# Patient Record
Sex: Female | Born: 1940
Health system: Southern US, Community
[De-identification: ages and names within clinical notes are randomized; demographics above are authoritative.]

## PROBLEM LIST (undated history)

## (undated) DIAGNOSIS — E049 Nontoxic goiter, unspecified: Secondary | ICD-10-CM

## (undated) DIAGNOSIS — E039 Hypothyroidism, unspecified: Secondary | ICD-10-CM

## (undated) DIAGNOSIS — C55 Malignant neoplasm of uterus, part unspecified: Secondary | ICD-10-CM

## (undated) DIAGNOSIS — I272 Pulmonary hypertension, unspecified: Secondary | ICD-10-CM

## (undated) DIAGNOSIS — I10 Essential (primary) hypertension: Secondary | ICD-10-CM

## (undated) DIAGNOSIS — K589 Irritable bowel syndrome without diarrhea: Secondary | ICD-10-CM

## (undated) DIAGNOSIS — M8580 Other specified disorders of bone density and structure, unspecified site: Secondary | ICD-10-CM

## (undated) DIAGNOSIS — H348122 Central retinal vein occlusion, left eye, stable: Secondary | ICD-10-CM

## (undated) DIAGNOSIS — H6980 Other specified disorders of Eustachian tube, unspecified ear: Secondary | ICD-10-CM

## (undated) DIAGNOSIS — J449 Chronic obstructive pulmonary disease, unspecified: Secondary | ICD-10-CM

## (undated) DIAGNOSIS — E119 Type 2 diabetes mellitus without complications: Secondary | ICD-10-CM

## (undated) DIAGNOSIS — R5383 Other fatigue: Secondary | ICD-10-CM

## (undated) DIAGNOSIS — G4700 Insomnia, unspecified: Secondary | ICD-10-CM

## (undated) DIAGNOSIS — IMO0002 Reserved for concepts with insufficient information to code with codable children: Secondary | ICD-10-CM

## (undated) DIAGNOSIS — E785 Hyperlipidemia, unspecified: Secondary | ICD-10-CM

## (undated) DIAGNOSIS — K5790 Diverticulosis of intestine, part unspecified, without perforation or abscess without bleeding: Secondary | ICD-10-CM

## (undated) DIAGNOSIS — K219 Gastro-esophageal reflux disease without esophagitis: Secondary | ICD-10-CM

## (undated) DIAGNOSIS — F419 Anxiety disorder, unspecified: Secondary | ICD-10-CM

## (undated) DIAGNOSIS — H6990 Unspecified Eustachian tube disorder, unspecified ear: Secondary | ICD-10-CM

## (undated) DIAGNOSIS — N189 Chronic kidney disease, unspecified: Secondary | ICD-10-CM

## (undated) DIAGNOSIS — C3490 Malignant neoplasm of unspecified part of unspecified bronchus or lung: Secondary | ICD-10-CM

## (undated) DIAGNOSIS — M329 Systemic lupus erythematosus, unspecified: Secondary | ICD-10-CM

## (undated) DIAGNOSIS — N3941 Urge incontinence: Secondary | ICD-10-CM

## (undated) DIAGNOSIS — M359 Systemic involvement of connective tissue, unspecified: Secondary | ICD-10-CM

## (undated) HISTORY — DX: Systemic involvement of connective tissue, unspecified: M35.9

## (undated) HISTORY — DX: Urge incontinence: N39.41

## (undated) HISTORY — DX: Unspecified eustachian tube disorder, unspecified ear: H69.90

## (undated) HISTORY — DX: Diverticulosis of intestine, part unspecified, without perforation or abscess without bleeding: K57.90

## (undated) HISTORY — DX: Gastro-esophageal reflux disease without esophagitis: K21.9

## (undated) HISTORY — DX: Other fatigue: R53.83

## (undated) HISTORY — DX: Other specified disorders of Eustachian tube, unspecified ear: H69.80

## (undated) HISTORY — DX: Other specified disorders of bone density and structure, unspecified site: M85.80

## (undated) HISTORY — DX: Central retinal vein occlusion, left eye, stable: H34.8122

## (undated) HISTORY — DX: Irritable bowel syndrome, unspecified: K58.9

## (undated) HISTORY — DX: Reserved for concepts with insufficient information to code with codable children: IMO0002

## (undated) HISTORY — DX: Malignant neoplasm of unspecified part of unspecified bronchus or lung: C34.90

## (undated) HISTORY — DX: Malignant neoplasm of uterus, part unspecified: C55

## (undated) HISTORY — DX: Essential (primary) hypertension: I10

## (undated) HISTORY — DX: Hyperlipidemia, unspecified: E78.5

## (undated) HISTORY — DX: Nontoxic goiter, unspecified: E04.9

## (undated) HISTORY — DX: Chronic obstructive pulmonary disease, unspecified: J44.9

## (undated) HISTORY — DX: Pulmonary hypertension, unspecified: I27.20

## (undated) HISTORY — DX: Hypothyroidism, unspecified: E03.9

## (undated) HISTORY — DX: Insomnia, unspecified: G47.00

## (undated) HISTORY — DX: Anxiety disorder, unspecified: F41.9

## (undated) HISTORY — DX: Systemic lupus erythematosus, unspecified: M32.9

## (undated) HISTORY — DX: Chronic kidney disease, unspecified: N18.9

## (undated) HISTORY — PX: THYROIDECTOMY: SHX17

---
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
## 1973-03-24 HISTORY — PX: TUBAL LIGATION: SHX77

## 1980-03-24 HISTORY — PX: CHOLECYSTECTOMY: SHX55

## 1999-04-25 HISTORY — PX: LOBECTOMY: SHX5089

## 1999-06-23 HISTORY — PX: ABDOMINAL HYSTERECTOMY: SHX81

## 2001-03-24 HISTORY — PX: OTHER SURGICAL HISTORY: SHX169

## 2002-08-03 ENCOUNTER — Encounter (INDEPENDENT_AMBULATORY_CARE_PROVIDER_SITE_OTHER): Payer: Self-pay | Admitting: Internal Medicine

## 2003-03-25 ENCOUNTER — Encounter (INDEPENDENT_AMBULATORY_CARE_PROVIDER_SITE_OTHER): Payer: Self-pay | Admitting: Internal Medicine

## 2003-03-25 LAB — CONVERTED CEMR LAB

## 2003-07-23 LAB — HM MAMMOGRAPHY

## 2004-11-05 ENCOUNTER — Ambulatory Visit: Payer: Self-pay | Admitting: Internal Medicine

## 2004-11-29 ENCOUNTER — Ambulatory Visit: Payer: Self-pay | Admitting: Internal Medicine

## 2005-01-01 ENCOUNTER — Ambulatory Visit: Payer: Self-pay | Admitting: Internal Medicine

## 2005-03-12 ENCOUNTER — Ambulatory Visit: Payer: Self-pay | Admitting: Family Medicine

## 2005-03-24 HISTORY — PX: OTHER SURGICAL HISTORY: SHX169

## 2005-04-08 ENCOUNTER — Ambulatory Visit: Payer: Self-pay | Admitting: Family Medicine

## 2005-04-21 ENCOUNTER — Ambulatory Visit: Payer: Self-pay | Admitting: Internal Medicine

## 2005-06-03 ENCOUNTER — Ambulatory Visit: Payer: Self-pay | Admitting: Family Medicine

## 2005-06-17 ENCOUNTER — Encounter: Payer: Self-pay | Admitting: Family Medicine

## 2005-06-22 ENCOUNTER — Encounter: Payer: Self-pay | Admitting: Family Medicine

## 2005-07-22 ENCOUNTER — Encounter: Payer: Self-pay | Admitting: Family Medicine

## 2005-08-21 ENCOUNTER — Ambulatory Visit: Payer: Self-pay | Admitting: Family Medicine

## 2005-09-08 ENCOUNTER — Ambulatory Visit: Payer: Self-pay | Admitting: Family Medicine

## 2005-10-12 ENCOUNTER — Encounter: Admission: RE | Admit: 2005-10-12 | Discharge: 2005-10-12 | Payer: Self-pay | Admitting: Orthopaedic Surgery

## 2005-12-10 ENCOUNTER — Ambulatory Visit: Payer: Self-pay | Admitting: Family Medicine

## 2005-12-18 ENCOUNTER — Ambulatory Visit: Payer: Self-pay | Admitting: Family Medicine

## 2005-12-31 ENCOUNTER — Ambulatory Visit: Payer: Self-pay | Admitting: Internal Medicine

## 2006-01-05 ENCOUNTER — Ambulatory Visit: Payer: Self-pay | Admitting: Family Medicine

## 2006-01-27 ENCOUNTER — Ambulatory Visit: Payer: Self-pay

## 2006-04-15 ENCOUNTER — Ambulatory Visit: Payer: Self-pay | Admitting: Family Medicine

## 2006-05-11 ENCOUNTER — Ambulatory Visit: Payer: Self-pay | Admitting: Family Medicine

## 2006-06-04 ENCOUNTER — Ambulatory Visit: Payer: Self-pay | Admitting: Family Medicine

## 2006-06-22 ENCOUNTER — Encounter (INDEPENDENT_AMBULATORY_CARE_PROVIDER_SITE_OTHER): Payer: Self-pay | Admitting: Internal Medicine

## 2006-06-22 DIAGNOSIS — I129 Hypertensive chronic kidney disease with stage 1 through stage 4 chronic kidney disease, or unspecified chronic kidney disease: Secondary | ICD-10-CM | POA: Insufficient documentation

## 2006-06-22 DIAGNOSIS — M8580 Other specified disorders of bone density and structure, unspecified site: Secondary | ICD-10-CM | POA: Insufficient documentation

## 2006-06-22 DIAGNOSIS — N183 Chronic kidney disease, stage 3 unspecified: Secondary | ICD-10-CM | POA: Insufficient documentation

## 2006-06-22 DIAGNOSIS — Z8719 Personal history of other diseases of the digestive system: Secondary | ICD-10-CM | POA: Insufficient documentation

## 2006-06-22 DIAGNOSIS — I10 Essential (primary) hypertension: Secondary | ICD-10-CM | POA: Insufficient documentation

## 2006-06-22 DIAGNOSIS — K219 Gastro-esophageal reflux disease without esophagitis: Secondary | ICD-10-CM | POA: Insufficient documentation

## 2006-06-22 DIAGNOSIS — E890 Postprocedural hypothyroidism: Secondary | ICD-10-CM | POA: Insufficient documentation

## 2006-06-22 DIAGNOSIS — E049 Nontoxic goiter, unspecified: Secondary | ICD-10-CM | POA: Insufficient documentation

## 2006-06-22 DIAGNOSIS — Z8542 Personal history of malignant neoplasm of other parts of uterus: Secondary | ICD-10-CM | POA: Insufficient documentation

## 2006-06-22 DIAGNOSIS — Z85118 Personal history of other malignant neoplasm of bronchus and lung: Secondary | ICD-10-CM | POA: Insufficient documentation

## 2006-08-14 ENCOUNTER — Ambulatory Visit: Payer: Self-pay | Admitting: Internal Medicine

## 2006-08-14 DIAGNOSIS — T7840XA Allergy, unspecified, initial encounter: Secondary | ICD-10-CM | POA: Insufficient documentation

## 2006-09-21 ENCOUNTER — Ambulatory Visit: Payer: Self-pay | Admitting: Internal Medicine

## 2006-10-02 ENCOUNTER — Ambulatory Visit: Payer: Self-pay | Admitting: Internal Medicine

## 2006-10-05 LAB — CONVERTED CEMR LAB
BUN: 10 mg/dL (ref 6–23)
CO2: 29 meq/L (ref 19–32)
GFR calc Af Amer: 108 mL/min
LDL Cholesterol: 126 mg/dL — ABNORMAL HIGH (ref 0–99)
Potassium: 3.8 meq/L (ref 3.5–5.1)
TSH: 0.59 microintl units/mL (ref 0.35–5.50)
Total CHOL/HDL Ratio: 4.7
VLDL: 16 mg/dL (ref 0–40)

## 2006-10-13 ENCOUNTER — Encounter (INDEPENDENT_AMBULATORY_CARE_PROVIDER_SITE_OTHER): Payer: Self-pay | Admitting: Internal Medicine

## 2006-10-24 ENCOUNTER — Emergency Department: Payer: Self-pay

## 2006-11-02 ENCOUNTER — Ambulatory Visit: Payer: Self-pay | Admitting: Family Medicine

## 2006-11-06 ENCOUNTER — Ambulatory Visit: Payer: Self-pay | Admitting: Family Medicine

## 2006-11-06 DIAGNOSIS — R300 Dysuria: Secondary | ICD-10-CM | POA: Insufficient documentation

## 2006-11-06 LAB — CONVERTED CEMR LAB
Ketones, urine, test strip: NEGATIVE
Specific Gravity, Urine: <1.005
Urobilinogen, UA: NEGATIVE
pH: 6

## 2006-12-18 ENCOUNTER — Telehealth: Payer: Self-pay | Admitting: Family Medicine

## 2006-12-29 ENCOUNTER — Ambulatory Visit: Payer: Self-pay | Admitting: Family Medicine

## 2007-01-29 ENCOUNTER — Telehealth (INDEPENDENT_AMBULATORY_CARE_PROVIDER_SITE_OTHER): Payer: Self-pay | Admitting: Internal Medicine

## 2007-03-13 ENCOUNTER — Telehealth: Payer: Self-pay | Admitting: Internal Medicine

## 2007-04-23 ENCOUNTER — Ambulatory Visit: Payer: Self-pay | Admitting: Family Medicine

## 2007-04-23 DIAGNOSIS — N3941 Urge incontinence: Secondary | ICD-10-CM | POA: Insufficient documentation

## 2007-04-26 ENCOUNTER — Encounter (INDEPENDENT_AMBULATORY_CARE_PROVIDER_SITE_OTHER): Payer: Self-pay | Admitting: Internal Medicine

## 2007-04-27 ENCOUNTER — Ambulatory Visit: Payer: Self-pay | Admitting: Family Medicine

## 2007-04-27 ENCOUNTER — Encounter (INDEPENDENT_AMBULATORY_CARE_PROVIDER_SITE_OTHER): Payer: Self-pay | Admitting: Internal Medicine

## 2007-04-27 DIAGNOSIS — E7800 Pure hypercholesterolemia, unspecified: Secondary | ICD-10-CM | POA: Insufficient documentation

## 2007-04-29 LAB — CONVERTED CEMR LAB
BUN: 11 mg/dL (ref 6–23)
Basophils Absolute: 0.1 10*3/uL (ref 0.0–0.1)
Basophils Relative: 2.7 % — ABNORMAL HIGH (ref 0.0–1.0)
Calcium: 9.7 mg/dL (ref 8.4–10.5)
Chloride: 103 meq/L (ref 96–112)
Cholesterol: 188 mg/dL (ref 0–200)
Creatinine, Ser: 0.8 mg/dL (ref 0.4–1.2)
Eosinophils Absolute: 0.2 10*3/uL (ref 0.0–0.6)
Eosinophils Relative: 5.5 % — ABNORMAL HIGH (ref 0.0–5.0)
GFR calc Af Amer: 92 mL/min
Glucose, Bld: 101 mg/dL — ABNORMAL HIGH (ref 70–99)
HDL: 43.2 mg/dL (ref >39.0)
LDL Cholesterol: 134 mg/dL — ABNORMAL HIGH (ref 0–99)
Monocytes Absolute: 0.4 10*3/uL (ref 0.2–0.7)
Sodium: 140 meq/L (ref 135–145)
TSH: 3.61 microintl units/mL (ref 0.35–5.50)
Total CHOL/HDL Ratio: 4.4
Triglycerides: 56 mg/dL (ref 0–149)
VLDL: 11 mg/dL (ref 0–40)
Vit D, 1,25-Dihydroxy: 100 — ABNORMAL HIGH (ref 30–89)

## 2007-05-04 ENCOUNTER — Encounter (INDEPENDENT_AMBULATORY_CARE_PROVIDER_SITE_OTHER): Payer: Self-pay | Admitting: Internal Medicine

## 2007-05-07 ENCOUNTER — Encounter (INDEPENDENT_AMBULATORY_CARE_PROVIDER_SITE_OTHER): Payer: Self-pay | Admitting: Internal Medicine

## 2007-05-07 ENCOUNTER — Ambulatory Visit: Payer: Self-pay | Admitting: Family Medicine

## 2007-05-10 ENCOUNTER — Telehealth (INDEPENDENT_AMBULATORY_CARE_PROVIDER_SITE_OTHER): Payer: Self-pay | Admitting: Internal Medicine

## 2007-06-08 ENCOUNTER — Encounter (INDEPENDENT_AMBULATORY_CARE_PROVIDER_SITE_OTHER): Payer: Self-pay | Admitting: Internal Medicine

## 2007-06-25 ENCOUNTER — Encounter (INDEPENDENT_AMBULATORY_CARE_PROVIDER_SITE_OTHER): Payer: Self-pay | Admitting: Internal Medicine

## 2007-06-28 ENCOUNTER — Ambulatory Visit: Payer: Self-pay | Admitting: Family Medicine

## 2007-06-28 LAB — CONVERTED CEMR LAB
Bilirubin Urine: NEGATIVE
Glucose, Urine, Semiquant: NEGATIVE
Protein, U semiquant: NEGATIVE
pH: 7

## 2007-06-29 ENCOUNTER — Telehealth (INDEPENDENT_AMBULATORY_CARE_PROVIDER_SITE_OTHER): Payer: Self-pay | Admitting: Internal Medicine

## 2007-07-12 ENCOUNTER — Encounter (INDEPENDENT_AMBULATORY_CARE_PROVIDER_SITE_OTHER): Payer: Self-pay | Admitting: Internal Medicine

## 2007-07-12 DIAGNOSIS — K589 Irritable bowel syndrome without diarrhea: Secondary | ICD-10-CM | POA: Insufficient documentation

## 2007-09-15 ENCOUNTER — Encounter: Admission: RE | Admit: 2007-09-15 | Discharge: 2007-09-15 | Payer: Self-pay | Admitting: Orthopaedic Surgery

## 2007-10-05 ENCOUNTER — Ambulatory Visit: Payer: Self-pay | Admitting: Family Medicine

## 2007-11-01 ENCOUNTER — Encounter (INDEPENDENT_AMBULATORY_CARE_PROVIDER_SITE_OTHER): Payer: Self-pay | Admitting: Internal Medicine

## 2007-11-01 ENCOUNTER — Ambulatory Visit (HOSPITAL_COMMUNITY): Admission: RE | Admit: 2007-11-01 | Discharge: 2007-11-01 | Payer: Self-pay | Admitting: Orthopaedic Surgery

## 2007-11-05 ENCOUNTER — Encounter (INDEPENDENT_AMBULATORY_CARE_PROVIDER_SITE_OTHER): Payer: Self-pay | Admitting: Internal Medicine

## 2007-11-19 ENCOUNTER — Encounter: Payer: Self-pay | Admitting: Internal Medicine

## 2007-11-30 ENCOUNTER — Encounter: Payer: Self-pay | Admitting: Internal Medicine

## 2007-12-14 ENCOUNTER — Ambulatory Visit: Payer: Self-pay | Admitting: Family Medicine

## 2007-12-17 ENCOUNTER — Encounter: Payer: Self-pay | Admitting: Family Medicine

## 2007-12-17 ENCOUNTER — Encounter: Payer: Self-pay | Admitting: Internal Medicine

## 2008-03-02 ENCOUNTER — Telehealth (INDEPENDENT_AMBULATORY_CARE_PROVIDER_SITE_OTHER): Payer: Self-pay | Admitting: Internal Medicine

## 2008-03-06 ENCOUNTER — Telehealth (INDEPENDENT_AMBULATORY_CARE_PROVIDER_SITE_OTHER): Payer: Self-pay | Admitting: Internal Medicine

## 2008-03-09 ENCOUNTER — Encounter (INDEPENDENT_AMBULATORY_CARE_PROVIDER_SITE_OTHER): Payer: Self-pay | Admitting: Internal Medicine

## 2008-03-24 HISTORY — PX: CT ABD W & PELVIS WO CM: HXRAD294

## 2008-03-29 ENCOUNTER — Telehealth (INDEPENDENT_AMBULATORY_CARE_PROVIDER_SITE_OTHER): Payer: Self-pay | Admitting: Internal Medicine

## 2008-03-31 ENCOUNTER — Telehealth (INDEPENDENT_AMBULATORY_CARE_PROVIDER_SITE_OTHER): Payer: Self-pay | Admitting: Internal Medicine

## 2008-03-31 ENCOUNTER — Ambulatory Visit: Payer: Self-pay | Admitting: Family Medicine

## 2008-04-07 LAB — CONVERTED CEMR LAB
CO2: 33 meq/L — ABNORMAL HIGH (ref 19–32)
Chloride: 105 meq/L (ref 96–112)
Cholesterol: 190 mg/dL (ref 0–200)
GFR calc Af Amer: 92 mL/min
GFR calc non Af Amer: 76 mL/min
Potassium: 4 meq/L (ref 3.5–5.1)
Total CHOL/HDL Ratio: 4.4
Triglycerides: 69 mg/dL (ref 0–149)
VLDL: 14 mg/dL (ref 0–40)

## 2008-05-19 ENCOUNTER — Ambulatory Visit: Payer: Self-pay | Admitting: Internal Medicine

## 2008-05-22 ENCOUNTER — Ambulatory Visit: Payer: Self-pay | Admitting: Oncology

## 2008-06-05 ENCOUNTER — Telehealth (INDEPENDENT_AMBULATORY_CARE_PROVIDER_SITE_OTHER): Payer: Self-pay | Admitting: Internal Medicine

## 2008-06-09 ENCOUNTER — Telehealth (INDEPENDENT_AMBULATORY_CARE_PROVIDER_SITE_OTHER): Payer: Self-pay | Admitting: Internal Medicine

## 2008-06-22 ENCOUNTER — Ambulatory Visit: Payer: Self-pay | Admitting: Oncology

## 2008-06-26 ENCOUNTER — Ambulatory Visit: Payer: Self-pay | Admitting: Oncology

## 2008-07-22 ENCOUNTER — Ambulatory Visit: Payer: Self-pay | Admitting: Oncology

## 2008-07-25 ENCOUNTER — Ambulatory Visit: Payer: Self-pay | Admitting: Family Medicine

## 2008-08-09 ENCOUNTER — Telehealth (INDEPENDENT_AMBULATORY_CARE_PROVIDER_SITE_OTHER): Payer: Self-pay | Admitting: Internal Medicine

## 2008-08-18 ENCOUNTER — Ambulatory Visit: Payer: Self-pay | Admitting: Family Medicine

## 2008-08-18 DIAGNOSIS — H6980 Other specified disorders of Eustachian tube, unspecified ear: Secondary | ICD-10-CM | POA: Insufficient documentation

## 2008-12-01 ENCOUNTER — Ambulatory Visit: Payer: Self-pay | Admitting: Family Medicine

## 2008-12-05 ENCOUNTER — Telehealth (INDEPENDENT_AMBULATORY_CARE_PROVIDER_SITE_OTHER): Payer: Self-pay | Admitting: Internal Medicine

## 2008-12-12 ENCOUNTER — Ambulatory Visit: Payer: Self-pay | Admitting: Family Medicine

## 2008-12-12 DIAGNOSIS — H5440 Blindness, one eye, unspecified eye: Secondary | ICD-10-CM | POA: Insufficient documentation

## 2008-12-27 ENCOUNTER — Emergency Department: Payer: Self-pay | Admitting: Emergency Medicine

## 2008-12-30 ENCOUNTER — Inpatient Hospital Stay: Payer: Self-pay | Admitting: Internal Medicine

## 2008-12-30 ENCOUNTER — Encounter: Payer: Self-pay | Admitting: Cardiology

## 2009-01-01 ENCOUNTER — Encounter: Payer: Self-pay | Admitting: Internal Medicine

## 2009-01-01 ENCOUNTER — Telehealth (INDEPENDENT_AMBULATORY_CARE_PROVIDER_SITE_OTHER): Payer: Self-pay | Admitting: Internal Medicine

## 2009-01-01 ENCOUNTER — Encounter (INDEPENDENT_AMBULATORY_CARE_PROVIDER_SITE_OTHER): Payer: Self-pay | Admitting: Internal Medicine

## 2009-01-02 ENCOUNTER — Encounter: Payer: Self-pay | Admitting: Internal Medicine

## 2009-01-02 ENCOUNTER — Ambulatory Visit: Payer: Self-pay | Admitting: Family Medicine

## 2009-01-03 ENCOUNTER — Encounter (INDEPENDENT_AMBULATORY_CARE_PROVIDER_SITE_OTHER): Payer: Self-pay | Admitting: Internal Medicine

## 2009-01-04 ENCOUNTER — Telehealth (INDEPENDENT_AMBULATORY_CARE_PROVIDER_SITE_OTHER): Payer: Self-pay | Admitting: Internal Medicine

## 2009-01-04 ENCOUNTER — Ambulatory Visit: Payer: Self-pay | Admitting: Cardiology

## 2009-01-04 ENCOUNTER — Telehealth: Payer: Self-pay | Admitting: Family Medicine

## 2009-01-04 DIAGNOSIS — J449 Chronic obstructive pulmonary disease, unspecified: Secondary | ICD-10-CM | POA: Insufficient documentation

## 2009-01-04 DIAGNOSIS — I2789 Other specified pulmonary heart diseases: Secondary | ICD-10-CM | POA: Insufficient documentation

## 2009-01-06 ENCOUNTER — Emergency Department (HOSPITAL_COMMUNITY): Admission: EM | Admit: 2009-01-06 | Discharge: 2009-01-06 | Payer: Self-pay | Admitting: Emergency Medicine

## 2009-01-06 ENCOUNTER — Telehealth (INDEPENDENT_AMBULATORY_CARE_PROVIDER_SITE_OTHER): Payer: Self-pay | Admitting: Internal Medicine

## 2009-01-08 ENCOUNTER — Ambulatory Visit: Admission: RE | Admit: 2009-01-08 | Discharge: 2009-01-08 | Payer: Self-pay | Admitting: Cardiology

## 2009-01-08 ENCOUNTER — Telehealth: Payer: Self-pay | Admitting: Cardiology

## 2009-01-08 ENCOUNTER — Encounter: Payer: Self-pay | Admitting: Cardiology

## 2009-01-08 ENCOUNTER — Encounter: Payer: Self-pay | Admitting: Internal Medicine

## 2009-01-10 ENCOUNTER — Ambulatory Visit: Payer: Self-pay

## 2009-01-10 ENCOUNTER — Telehealth: Payer: Self-pay | Admitting: Cardiology

## 2009-01-10 ENCOUNTER — Encounter: Payer: Self-pay | Admitting: Cardiology

## 2009-01-10 ENCOUNTER — Ambulatory Visit: Payer: Self-pay | Admitting: Cardiovascular Disease

## 2009-01-10 ENCOUNTER — Ambulatory Visit (HOSPITAL_COMMUNITY): Admission: RE | Admit: 2009-01-10 | Discharge: 2009-01-10 | Payer: Self-pay | Admitting: Cardiovascular Disease

## 2009-01-12 ENCOUNTER — Ambulatory Visit: Payer: Self-pay | Admitting: Cardiology

## 2009-01-12 DIAGNOSIS — E269 Hyperaldosteronism, unspecified: Secondary | ICD-10-CM | POA: Insufficient documentation

## 2009-01-12 DIAGNOSIS — E278 Other specified disorders of adrenal gland: Secondary | ICD-10-CM | POA: Insufficient documentation

## 2009-01-14 ENCOUNTER — Telehealth: Payer: Self-pay | Admitting: Nurse Practitioner

## 2009-01-15 LAB — CONVERTED CEMR LAB
BUN: 20 mg/dL (ref 6–23)
CO2: 27 meq/L (ref 19–32)
Calcium: 9.7 mg/dL (ref 8.4–10.5)
Creatinine, Ser: 0.88 mg/dL (ref 0.40–1.20)
Glucose, Bld: 73 mg/dL (ref 70–99)
Potassium: 4.4 meq/L (ref 3.5–5.3)
Sodium: 139 meq/L (ref 135–145)

## 2009-01-16 ENCOUNTER — Telehealth: Payer: Self-pay | Admitting: Cardiology

## 2009-01-17 ENCOUNTER — Telehealth: Payer: Self-pay | Admitting: Cardiology

## 2009-01-22 ENCOUNTER — Ambulatory Visit: Payer: Self-pay | Admitting: Oncology

## 2009-01-22 ENCOUNTER — Telehealth: Payer: Self-pay | Admitting: Cardiology

## 2009-01-25 ENCOUNTER — Encounter: Payer: Self-pay | Admitting: Cardiology

## 2009-01-31 ENCOUNTER — Ambulatory Visit: Payer: Self-pay | Admitting: Cardiology

## 2009-02-05 ENCOUNTER — Ambulatory Visit: Payer: Self-pay | Admitting: Oncology

## 2009-02-05 LAB — CONVERTED CEMR LAB
BUN: 17 mg/dL (ref 6–23)
Calcium: 9.6 mg/dL (ref 8.4–10.5)
Glucose, Bld: 86 mg/dL (ref 70–99)
Potassium: 4.5 meq/L (ref 3.5–5.3)

## 2009-02-12 ENCOUNTER — Telehealth: Payer: Self-pay | Admitting: Cardiology

## 2009-02-13 ENCOUNTER — Telehealth: Payer: Self-pay | Admitting: Cardiology

## 2009-02-13 ENCOUNTER — Telehealth (INDEPENDENT_AMBULATORY_CARE_PROVIDER_SITE_OTHER): Payer: Self-pay | Admitting: Internal Medicine

## 2009-02-14 ENCOUNTER — Ambulatory Visit: Payer: Self-pay | Admitting: Family Medicine

## 2009-02-16 ENCOUNTER — Ambulatory Visit: Payer: Self-pay | Admitting: Cardiology

## 2009-02-21 ENCOUNTER — Ambulatory Visit: Payer: Self-pay | Admitting: Oncology

## 2009-02-21 ENCOUNTER — Telehealth: Payer: Self-pay | Admitting: Cardiology

## 2009-03-05 ENCOUNTER — Telehealth: Payer: Self-pay | Admitting: Cardiology

## 2009-03-07 ENCOUNTER — Ambulatory Visit: Payer: Self-pay | Admitting: Oncology

## 2009-03-08 ENCOUNTER — Telehealth: Payer: Self-pay | Admitting: Cardiology

## 2009-03-09 ENCOUNTER — Ambulatory Visit: Payer: Self-pay | Admitting: Oncology

## 2009-03-12 ENCOUNTER — Telehealth: Payer: Self-pay | Admitting: Cardiology

## 2009-03-13 ENCOUNTER — Ambulatory Visit: Payer: Self-pay | Admitting: Cardiovascular Disease

## 2009-03-16 ENCOUNTER — Telehealth: Payer: Self-pay | Admitting: Nurse Practitioner

## 2009-03-20 ENCOUNTER — Telehealth: Payer: Self-pay | Admitting: Cardiology

## 2009-03-20 ENCOUNTER — Telehealth (INDEPENDENT_AMBULATORY_CARE_PROVIDER_SITE_OTHER): Payer: Self-pay | Admitting: Physician Assistant

## 2009-03-24 ENCOUNTER — Ambulatory Visit: Payer: Self-pay | Admitting: Oncology

## 2009-04-02 ENCOUNTER — Telehealth: Payer: Self-pay | Admitting: Cardiology

## 2009-04-04 ENCOUNTER — Ambulatory Visit: Payer: Self-pay | Admitting: Cardiology

## 2009-04-06 ENCOUNTER — Telehealth: Payer: Self-pay | Admitting: Cardiology

## 2009-04-09 ENCOUNTER — Telehealth: Payer: Self-pay | Admitting: Cardiology

## 2009-04-09 LAB — CONVERTED CEMR LAB
BUN: 18 mg/dL (ref 6–23)
CO2: 26 meq/L (ref 19–32)
Chloride: 105 meq/L (ref 96–112)
Sodium: 138 meq/L (ref 135–145)

## 2009-05-16 ENCOUNTER — Telehealth (INDEPENDENT_AMBULATORY_CARE_PROVIDER_SITE_OTHER): Payer: Self-pay | Admitting: *Deleted

## 2009-05-16 ENCOUNTER — Telehealth: Payer: Self-pay | Admitting: Internal Medicine

## 2009-05-16 ENCOUNTER — Telehealth: Payer: Self-pay | Admitting: Cardiology

## 2009-06-01 DIAGNOSIS — R5383 Other fatigue: Secondary | ICD-10-CM | POA: Insufficient documentation

## 2009-06-01 DIAGNOSIS — R5381 Other malaise: Secondary | ICD-10-CM | POA: Insufficient documentation

## 2009-06-04 ENCOUNTER — Ambulatory Visit: Payer: Self-pay | Admitting: Family Medicine

## 2009-06-11 LAB — CONVERTED CEMR LAB
Albumin: 3.9 g/dL (ref 3.5–5.2)
Alkaline Phosphatase: 69 units/L (ref 39–117)
Bilirubin, Direct: 0.1 mg/dL (ref 0.0–0.3)
HDL: 43 mg/dL (ref >39.00)
LDL Cholesterol: 111 mg/dL — ABNORMAL HIGH (ref 0–99)
T3, Free: 2.7 pg/mL (ref 2.3–4.2)
Total Bilirubin: 1 mg/dL (ref 0.3–1.2)
Total CHOL/HDL Ratio: 4
Total Protein: 8 g/dL (ref 6.0–8.3)

## 2009-06-21 ENCOUNTER — Ambulatory Visit: Payer: Self-pay | Admitting: Family Medicine

## 2009-06-25 ENCOUNTER — Telehealth: Payer: Self-pay | Admitting: Family Medicine

## 2009-07-10 ENCOUNTER — Ambulatory Visit: Payer: Self-pay | Admitting: Internal Medicine

## 2009-07-12 ENCOUNTER — Encounter: Payer: Self-pay | Admitting: Internal Medicine

## 2009-07-12 ENCOUNTER — Telehealth: Payer: Self-pay | Admitting: Internal Medicine

## 2009-07-16 ENCOUNTER — Telehealth (INDEPENDENT_AMBULATORY_CARE_PROVIDER_SITE_OTHER): Payer: Self-pay | Admitting: *Deleted

## 2009-07-18 ENCOUNTER — Telehealth: Payer: Self-pay | Admitting: Internal Medicine

## 2009-07-20 ENCOUNTER — Encounter: Payer: Self-pay | Admitting: Internal Medicine

## 2009-07-25 ENCOUNTER — Encounter: Payer: Self-pay | Admitting: Internal Medicine

## 2009-07-31 ENCOUNTER — Ambulatory Visit: Payer: Self-pay | Admitting: Cardiology

## 2009-08-01 ENCOUNTER — Telehealth: Payer: Self-pay | Admitting: Cardiology

## 2009-08-06 ENCOUNTER — Telehealth: Payer: Self-pay | Admitting: Cardiology

## 2009-08-07 LAB — CONVERTED CEMR LAB
BUN: 25 mg/dL — ABNORMAL HIGH (ref 6–23)
CO2: 29 meq/L (ref 19–32)
Chloride: 104 meq/L (ref 96–112)
Potassium: 5 meq/L (ref 3.5–5.1)

## 2009-08-16 ENCOUNTER — Telehealth (INDEPENDENT_AMBULATORY_CARE_PROVIDER_SITE_OTHER): Payer: Self-pay | Admitting: *Deleted

## 2009-09-20 ENCOUNTER — Telehealth: Payer: Self-pay | Admitting: Internal Medicine

## 2009-09-20 ENCOUNTER — Encounter (INDEPENDENT_AMBULATORY_CARE_PROVIDER_SITE_OTHER): Payer: Self-pay | Admitting: *Deleted

## 2009-09-20 ENCOUNTER — Telehealth: Payer: Self-pay | Admitting: Cardiology

## 2009-10-04 ENCOUNTER — Telehealth (INDEPENDENT_AMBULATORY_CARE_PROVIDER_SITE_OTHER): Payer: Self-pay | Admitting: *Deleted

## 2009-12-06 ENCOUNTER — Encounter: Payer: Self-pay | Admitting: Cardiology

## 2010-01-14 ENCOUNTER — Ambulatory Visit: Payer: Self-pay | Admitting: Cardiology

## 2010-01-15 ENCOUNTER — Telehealth: Payer: Self-pay | Admitting: Cardiology

## 2010-01-15 LAB — CONVERTED CEMR LAB
CO2: 29 meq/L (ref 19–32)
Chloride: 103 meq/L (ref 96–112)
Glucose, Bld: 65 mg/dL — ABNORMAL LOW (ref 70–99)

## 2010-01-22 ENCOUNTER — Encounter: Payer: Self-pay | Admitting: Cardiology

## 2010-01-22 ENCOUNTER — Ambulatory Visit: Payer: Self-pay | Admitting: Oncology

## 2010-01-25 ENCOUNTER — Ambulatory Visit: Payer: Self-pay | Admitting: Oncology

## 2010-01-25 ENCOUNTER — Encounter: Payer: Self-pay | Admitting: Internal Medicine

## 2010-01-28 ENCOUNTER — Encounter: Payer: Self-pay | Admitting: Cardiology

## 2010-02-01 ENCOUNTER — Telehealth: Payer: Self-pay | Admitting: Cardiology

## 2010-02-07 ENCOUNTER — Telehealth: Payer: Self-pay | Admitting: Cardiology

## 2010-02-21 ENCOUNTER — Ambulatory Visit: Payer: Self-pay | Admitting: Oncology

## 2010-03-28 ENCOUNTER — Ambulatory Visit
Admission: RE | Admit: 2010-03-28 | Discharge: 2010-03-28 | Payer: Self-pay | Source: Home / Self Care | Attending: Family Medicine | Admitting: Family Medicine

## 2010-04-23 ENCOUNTER — Encounter: Payer: Self-pay | Admitting: Family Medicine

## 2010-04-23 NOTE — Letter (Signed)
Summary: Generic Letter  Press photographer, [place]  [address] [place]   [place], [place] [address]   Phone: [phone]  Fax: [phone]        January 28, 2010 MRN: [id number]    [address] [place], [place]  [address]    Dear Ms. Quintanilla,  I have been unable to reach you by telephone. Please call me to discuss scheduling a chest CT that we talked about previously.         Sincerely,  Desiree Lucy, RN, BSN  This letter has been electronically signed by your physician.

## 2010-04-23 NOTE — Progress Notes (Signed)
  Phone Note Call from Patient   Caller: Patient Initial call taken by: Doug Sou CMA,  October 04, 2009 10:45 AM  Follow-up for Phone Call        Pt called today very upset as she stated that we still have not taken care of her medication refill request.  She was told by scheduler that the rx had been sent.  Reviewing records the Rxs she requested had been sent and the one she had not recvd confirmation on was her lorazepam.  I explained to her that per our last conversation she had been informed that the synthroid and Lorazepam would go to Dr. Everardo Beals  office for follow up since she had not seen a PCP.  She got very upset and raised her voice stating that she did not agree to that and wanted this medication filled today by Dr. Kirk Ruths.  Pt has been informed of our refill policy and that she will need to establish with PCP for follow up of these medications.  I agreed to fill Rx this time under Dr. Oleh Genin name for 3 month supply.  Pt understood direction to follow up with PCP.   Follow-up by: Doug Sou CMA,  October 04, 2009 10:51 AM

## 2010-04-23 NOTE — Progress Notes (Signed)
----   Converted from flag ---- ---- 09/20/2009 2:38 PM, Doug Sou CMA wrote: pt requesting refill on Lorazepam and Synthroid.  Please see record.  All other Rx filled 09/20/2009 AT, CMA --------------------------- Phone Note From Other Clinic   Summary of Call: needs appt here  okay to refill lorazepam  #15 x 1 until she can get appt here for follow up had 1 year Rx of levothyroxine done in January Initial call taken by: Claris Gower MD,  September 20, 2009 3:32 PM  Follow-up for Phone Call        Pt.called and said she has a new PCP and she'll be contacting the pharmacy to send the rx refill request to her new PCP. Follow-up by: Virgia Land,  September 21, 2009 9:02 AM

## 2010-04-23 NOTE — Progress Notes (Signed)
Summary: REFILL MEDS- MAIL TO PATIENT  Phone Note Refill Request Call back at Home Phone [phone] Message from:  Patient on Aug 01, 2009 9:36 AM  Refills Requested: Medication #1:  LORAZEPAM 1 MG TABS 1/2  by mouth at bedtime  Method Requested: Mail to Patient Initial call taken by: Neil Crouch,  Aug 01, 2009 9:37 AM    Prescriptions: LORAZEPAM 1 MG TABS (LORAZEPAM) 1/2  by mouth at bedtime  #15 x 1   Entered by:   Doug Sou CMA   Authorized by:   Loralie Champagne, MD   Signed by:   Doug Sou CMA on 08/01/2009   Method used:   Print then Give to Patient   RxIDVS:[id number] LORAZEPAM 1 MG TABS (LORAZEPAM) 1/2  by mouth at bedtime  #15 x 1   Entered by:   Doug Sou CMA   Authorized by:   Loralie Champagne, MD   Signed by:   Doug Sou CMA on 08/01/2009   Method used:   Handwritten   RxIDOI:[id number]

## 2010-04-23 NOTE — Progress Notes (Signed)
Summary: BP issues  Phone Note Outgoing Call   Summary of Call: called pt to let her know results of blood work.  pt stated that she was planning on calling because her BP had started to trend upward and she has stopped benezapril and restarted lotrel as discussed at last ROV.   Initial call taken by: Gabriel Cirri, RN, BSN,  April 09, 2009 11:02 AM     Appended Document: BP issues that is fine.

## 2010-04-23 NOTE — Progress Notes (Signed)
Summary: calling re ct scan already done  Phone Note Call from Patient   Caller: Patient Reason for Call: Talk to Nurse Summary of Call: pt had ct scan done by dr chotski-wanted to let us know Initial call taken by: Lorenda Hatchet,  February 01, 2010 10:55 AM  Follow-up for Phone Call        I talked with pt--she had CT done at [facility]-22-10 --she was told it was OK--she requested a copy be sent to Dr Nance Pew received  copy of CT chest without contrast done 01-22-10  Impression: No evidence of recurrent malignancy. Small  pulmonary nodules are unchanged  from prior studies. I will forward report to medical records to scan into [place]

## 2010-04-23 NOTE — Assessment & Plan Note (Signed)
Summary: ROV/AMD   Visit Type:  Follow-up Referring Provider:  Dixie Dials FNP Primary Provider:  Rosalita Levan Bean FNP  CC:  swelling in ankles and feet.  History of Present Illness: 70 yo with history of COPD and lung cancer and difficult to control hypertension presents for followup.    She was noted to have evidence for hyperaldosteronism by labs.  Dedicated adrenal CT did not show an adrenal adenoma.  She continues to walk about 4 miles a day without chest pain or shortness of breath.  Her BP is actually under good control on the current regimen, mainly running SBP 120s-130s.  She is not getting the night-time spikes or headaches.  No chest pain.  Her only complaint now is swelling in her ankles.  She is on high dose Procardia XL and also amlodipine (in Lotrel).   Labs (1/11): K 4.6, creatinine 1.59  Current Medications (verified): 1)  Synthroid 100 Mcg  Tabs (Levothyroxine Sodium) .... Take 1 Tablet By Mouth Once A Day 2)  Prevacid 15 Mg  Cpdr (Lansoprazole) .... Take 1 Tablet By Mouth Once A Day At Time Not Taking Med 3)  Calcium 600   Tabs (Calcium Carbonate Tabs) .... Take 1 Tablet By Mouth Two Times A Day 4)  Bl Balanced Care   Tabs (Multiple Vitamins-Minerals) .... Once Daily 5)  Albertsons Vitamin C   Tabs (Ascorbic Acid Tabs) .... Once Daily 6)  Flexeril 10 Mg Tabs (Cyclobenzaprine Hcl) .... Take One By Mouth At Bedtime As Needed 7)  Aspirin 81 Mg Tbec (Aspirin) .... Take One Tablet By Mouth Daily 8)  Bystolic 10 Mg Tabs (Nebivolol Hcl) .Marland Kitchen.. 1 Tab By Mouth Two Times A Day 9)  Spironolactone 25 Mg Tabs (Spironolactone) .Marland Kitchen.. 1  Tablet By Mouth Twice Daily 10)  Lorazepam 1 Mg Tabs (Lorazepam) .... 1/2  By Mouth At Bedtime 11)  Clonidine Hcl 0.1 Mg Tabs (Clonidine Hcl) .... One Tablet At Bedtime 12)  Procardia Xl 60 Mg Xr24h-Tab (Nifedipine) .Marland Kitchen.. 1 Tab By Mouth Two Times A Day 13)  Lotrel 5-40 Mg Caps (Amlodipine Besy-Benazepril Hcl) .Marland Kitchen.. 1 By Mouth  Daily  Allergies (verified): 1)  ! Diovan 2)  ! Norvasc  Vital Signs:  Patient profile:   70 year old female Height:      64 inches Weight:      138.75 pounds BMI:     23.90 Pulse rate:   58 / minute Pulse rhythm:   regular BP sitting:   112 / 50  (left arm) Cuff size:   regular  Vitals Entered By: Philemon Kingdom (April 04, 2009 10:21 AM)  Physical Exam  General:  Well developed, well nourished, in no acute distress. Neck:  Neck supple, no JVD. No masses, thyromegaly or abnormal cervical nodes. Lungs:  Clear bilaterally to auscultation and percussion. Heart:  Non-displaced PMI, chest non-tender; regular rate and rhythm, S1, S2 without murmur. +S4.  Carotid upstroke normal, no bruit. Pedals normal pulses. 1+ ankle edema bilaterally.  Abdomen:  Bowel sounds positive; abdomen soft and non-tender without masses, organomegaly, or hernias noted. No hepatosplenomegaly. Extremities:  No clubbing or cyanosis. Neurologic:  Alert and oriented x 3. Psych:  Normal affect.   Impression & Recommendations:  Problem # 1:  HYPERTENSION, UNSPECIFIED (ICD-401.9) Much improved compared to prior.  Patient now has some lower extremity edema.  She is on a lot of calcium channel blocker, which may be the culprit.  The edema has been uncomfortable at times.  She should  continue to wear compression hose.  I will have her stop Lotrel and instead take benazepril 40 mg daily.  She will continue on the same dose of Procardia.  If BP trends up, she can go back to the prior regimen.   Problem # 2:  HYPERALDOSTERONISM UNSPECIFIED (ICD-255.10) Continue spironolactone.  Check BMET today.   Problem # 3:  PURE HYPERCHOLESTEROLEMIA (ICD-272.0) Lipids/LFTs in 3 months.   Other Orders: T-Basic Metabolic Panel ([id number])  Patient Instructions: 1)  Your physician recommends that you schedule a follow-up appointment in: 3 months 2)  Your physician recommends that you return for a FASTING lipid profile: 2  months (lipids/ liver) 3)  Your physician has recommended you make the following change in your medication: stop lotrel, start benazepril 40mg  daily.  If you BP should start to trend upward, return to previous regimine.   Prescriptions: BENAZEPRIL HCL 40 MG TABS (BENAZEPRIL HCL) 1 tab by mouth daily  #30 x 1   Entered by:   Gabriel Cirri, RN, BSN   Authorized by:   Loralie Champagne, MD   Signed by:   Gabriel Cirri, RN, BSN on 04/04/2009   Method used:   [facility]        [facility] [facility]       [address]       [place], [place]  [address]       Ph: [phone] or [phone]       Fax: [phone]   RxID:   [id number]   Appended Document: Orders Update    Clinical Lists Changes  Orders: Added new Test order of T-Lipid Profile [phone]) - Signed Added new Test order of T-Hepatic Function [phone]) - Signed

## 2010-04-23 NOTE — Progress Notes (Signed)
Summary: refill request for lansoprazole  Phone Note Refill Request Message from:  Fax from Pharmacy  Refills Requested: Medication #1:  PREVACID 15 MG  CPDR Take 1 tablet by mouth once a day at time not taking med [facility] is on your desk- Billie's pt.  Initial call taken by: Marty Heck CMA,  May 16, 2009 11:38 AM  Follow-up for Phone Call        okay to refill for a year will need a follow up in the next few months with a different provider Follow-up by: Claris Gower MD,  May 16, 2009 12:57 PM  Additional Follow-up for Phone Call Additional follow up Details #1::        Rx [facility]ed to [facility] Additional Follow-up by: DeShannon Smith CMA Deborra Medina),  May 16, 2009 2:15 PM    Prescriptions: PREVACID 15 MG  CPDR (LANSOPRAZOLE) Take 1 tablet by mouth once a day at time not taking med  #90 x 3   Entered by:   Edwin Dada CMA ([place])   Authorized by:   Claris Gower MD   Signed by:   Edwin Dada CMA ([place]) on 05/16/2009   Method used:   Faxed to ...       [facility] (mail-order)       [address] [place], [place]  [id number]       Ph: [phone]       Fax: [phone]   RxID:   [id number]

## 2010-04-23 NOTE — Assessment & Plan Note (Signed)
Summary: BLOODY DIARRHEA/ lb   Vital Signs:  Patient profile:   70 year old female Weight:      133 pounds Temp:     97.9 degrees F oral BP sitting:   120 / 62  (left arm) Cuff size:   regular  Vitals Entered By: Edwin Dada CMA ([place]) (July 10, 2009 11:00 AM) CC: blood in stool   History of Present Illness: Started with diarrhea and abd pain yesterday Progressing redness with each progressive stool Now gets mucousy blood even with passing gas has knife like pain across lower abdomen Still with freq stools but mostly just mucus and blood  Nausea noted 2 nights ago--?the start of this Vomited yesterday multiple times--none since last night Able to eat 2 spoonfuls of rice crispies and 2 ritz crackers Drinking well--including gatorade  similar spell 13-14 years ago had to have IV fluids then  No fever Pain was constant before the vomiting---now is just intermittent  No travel out of the country No apparent tainted food No acquaintances with similar symptoms  Allergies: 1)  ! Diovan 2)  ! Norvasc  Past History:  Past medical, surgical, family and social histories (including risk factors) reviewed for relevance to current acute and chronic problems.  Past Medical History: 1. 3/09--IBS with constipation--[place] 2. HYPERTENSION: Has been difficult to control with spikes at night.  Has had hospitalizations with hypertensive    urgencies. Negative workup for pheochromocytoma with urine and plasma metanephrines and    catecholeamines normal.  Renal artery dopplers showed no renal artery stenosis.  Serum aldosterone/renin   ratio elevated (aldosterone 23.7, renin <0.15).  CT abdomen (not dedicated to adrenals) in 10/10 read as   "normal adrenals."  Dedicated adrenal CT (11/10) showed no adrenal adenoma.  There was a nodule at the   lung base, followup recommended.  3. HYPOTHYROID 4. GERD 5. Arthritis 6. COPD: quit smoking in July 03, 1991.   7. Lung cancer: s/p left lobectomy in  07/03/1999. 8. Uterine cancer s/p hysterectomy in 07/03/99.  9. Left eye central retinal venous occlusion.  10.  Diverticulosis 11.  Pulmonary hypertension: echo at [facility] (10/10) with EF > 55%, grade II diastolic dysfunction, RSVP > 60        mmHg.  See below for collagen vascular workup.  ACE level was normal.  However, followup echo at      [facility] showed EF 55-60%, mild MR, mild LAE, PASP 25 + RA (no evidence for pulmonary HTN).  12.  Uncharacterized collagen vascular disease: Has seen rheumatology.  Positive rheumatoid factor and      positive serologies for Sjogrens.  C-ANCA and P-ANCA negative, ANA negative, anti-dsDNA negative,     anti-SCL70 negative, anti-centromere antibody negative.   Past Surgical History: Reviewed history from 01/04/2009 and no changes required. RIGHT LUNG LOBECTOMY 02/01 HYSTERECTOMY 04/01 LEFT LUNG OK S/P SURGERY 06/01 CHOLECYSTECTOMY 1982 TUBAL LIGATION 1975 APPENDECTOMY 1958 LUNG LEFT SIDE SURGERY BENIGN 2002/07/03 CT chest--01/27/2006  enlarged thyroid extending into thoracis inlet (goiter) CT chest 01/2003  neg except for substernal thyroid and linear scarring rith upper lobe and left lower lobe CT head --12/30/2008--neg CT abd and pelvis--12/30/2008--neg except divertic Kidney scan--slow washout of R kidney, otherwise neg 2D transthoracic [id number] 2 diastolic dysfunction and severe pulm HTN, EF>55%,   Family History: Reviewed history from 01/04/2009 and no changes required. Father: DECEASED LATE 2022-07-03; PNEUMONIA AND ALCOHOLIC Mother: DECEASED 84 HTN No premature CAD  Social History: Reviewed history from 01/31/2009 and no changes required. Married, 4  children, lives in [place]. Originally from [place]. Tobacco Use - Former, quit 1993.  Alcohol Use - no Regular Exercise - yes Drug Use - no  Review of Systems       no dysuria No urgency--but has had some increased frequency (but forcing fluids)  Had loose stools with augmentin but that  resolved after she stopped  Physical Exam  General:  alert and normal appearance.   Neck:  supple, no masses, and no cervical lymphadenopathy.   Lungs:  normal respiratory effort and normal breath sounds.   Heart:  normal rate, regular rhythm, no murmur, and no gallop.   Abdomen:  soft and normal bowel sounds.   Mild RLQ tenderness no rebound  Extremities:  no edema   Impression & Recommendations:  Problem # 1:  DIARRHEA (ICD-787.91) Assessment New  the pain makes this seem like a dysentery type illness--- apparent colitis not clear if this is new illness (more likely due to concommittant vomiting with start of illness) or antibiotic related --since she had diarrhea with augmentin and just stopped last week No dehydration appears generally well  P:   check stool for C. diff      continue fluids      if no C diff and colitis continues, would try empriic Rx with cipro    if C diff present, metronidazole  Orders: T- * Misc. Laboratory test [id number]) Specimen Handling (99000)  Complete Medication List: 1)  Synthroid 100 Mcg Tabs (Levothyroxine sodium) .... Take 1 tablet by mouth once a day 2)  Prevacid 15 Mg Cpdr (Lansoprazole) .... Take 1 tablet by mouth once a day at time not taking med 3)  Flexeril 10 Mg Tabs (Cyclobenzaprine hcl) .... Take one by mouth at bedtime as needed 4)  Bystolic 10 Mg Tabs (Nebivolol hcl) .Marland Kitchen.. 1 tab by mouth two times a day 5)  Spironolactone 25 Mg Tabs (Spironolactone) .Marland Kitchen.. 1  tablet by mouth twice daily 6)  Lorazepam 1 Mg Tabs (Lorazepam) .... 1/2  by mouth at bedtime 7)  Clonidine Hcl 0.1 Mg Tabs (Clonidine hcl) .... One tablet at bedtime 8)  Procardia Xl 60 Mg Xr24h-tab (Nifedipine) .Marland Kitchen.. 1 tab by mouth two times a day 9)  Lotrel 5-40 Mg Caps (Amlodipine besy-benazepril hcl) .Marland Kitchen.. 1 tab by mouth daily- lotrel only: dispense as written 10)  Calcium 600 Tabs (Calcium carbonate tabs) .... Take 1 tablet by mouth two times a day 11)  Aspirin 81 Mg Tbec  (Aspirin) .... Take one tablet by mouth daily 12)  Bl Balanced Care Tabs (Multiple vitamins-minerals) .... Once daily 13)  Albertsons Vitamin C Tabs (Ascorbic acid tabs) .... Once daily  Patient Instructions: 1)  Try activia yogurt or probiotic tablets 2)  call if pain and bleeding doesn't improve 3)  Please return the stool specimen as soon as possible  Current Allergies (reviewed today): ! DIOVAN ! NORVASC

## 2010-04-23 NOTE — Progress Notes (Signed)
Summary: regarding labs for c-diff  Phone Note Call from Patient   Caller: Patient Call For: Dr. Silvio Pate Summary of Call: Please review lab result so that pt can be advised. Initial call taken by: Marty Heck CMA,  July 12, 2009 12:51 PM  Follow-up for Phone Call        Please call the c. diff test was negative  If her diarrhea is not any better, send Rx for cipro 500mg  two times a day for 7 days Follow-up by: Claris Gower MD,  July 12, 2009 1:10 PM  Additional Follow-up for Phone Call Additional follow up Details #1::        pt states she is not eating so she's not having any bowel movements, she is drinking water and eating some activia and crackers. Pt states she is still having abd pain and nausea and would like to know what this is? DeShannon Smith CMA Deborra Medina)  July 12, 2009 2:23 PM   It sounds like an infectious colitis as we had discussed. If the diarrhea is gone, just continue the [place] and fluids and hold off on the antibiotic. This should slowly improve and you should be able to advance your diet over the next few days Additional Follow-up by: Claris Gower MD,  July 12, 2009 2:51 PM    Additional Follow-up for Phone Call Additional follow up Details #2::    spoke with patient and she understood, she wanted me to send the abx anyway with the 4day weekend and she will call on Monday if she's not better. Rx sent to pharmacy. [place] Deborra Medina)  July 12, 2009 3:29 PM   okay Follow-up by: Claris Gower MD,  July 12, 2009 3:39 PM  New/Updated Medications: CIPROFLOXACIN HCL 500 MG TABS (CIPROFLOXACIN HCL) take 1 by mouth two times a day for 7 days Prescriptions: CIPROFLOXACIN HCL 500 MG TABS (CIPROFLOXACIN HCL) take 1 by mouth two times a day for 7 days  #14 x 0   Entered by:   Edwin Dada CMA ([place])   Authorized by:   Claris Gower MD   Signed by:   Edwin Dada CMA ([place]) on 07/12/2009   Method used:   [facility]     [facility] [facility]       [address]       [place], [place]  [address]       Ph: [phone] or [phone]       Fax: [phone]   RxID:   [id number]

## 2010-04-23 NOTE — Progress Notes (Signed)
Summary: RX  Phone Note Refill Request Call back at Home Phone [phone] Message from:  Patient on May 16, 2009 10:01 AM  Refills Requested: Medication #1:  BYSTOLIC 10 MG TABS 1 tab by mouth two times a day THE BYSTOLIC SHOULD BE 2 TIMES A DAY-[facility]  Initial call taken by: Zenovia Jarred,  May 16, 2009 10:01 AM  Follow-up for Phone Call        already placed rx by  [facility] Follow-up by: Philemon Kingdom,  May 17, 2009 7:37 AM

## 2010-04-23 NOTE — Progress Notes (Signed)
Summary: still with abd pain  Phone Note Call from Patient Call back at Home Phone [phone]   Caller: Patient Summary of Call: Pt is still having bad nausea, abd pain, loose stools.  She has not taken the cipro yet, but thinks she might need to.  She is asking if there is anything she can take along with that to ease the possible side effects of the abx.  She says the abd pain and loose stools will sometimes ease up but the nausea never goes away.  Please advise.  [facility]. Initial call taken by: Marty Heck CMA,  July 18, 2009 3:15 PM  Follow-up for Phone Call        Have her try the antibiotic She can take activia yogurt or try probiotic pills available in pharmacy or health food store this will sometimes help prevent yeast infections, etc Follow-up by: Claris Gower MD,  July 18, 2009 7:21 PM  Additional Follow-up for Phone Call Additional follow up Details #1::        Advised pt. Additional Follow-up by: Marty Heck CMA,  July 19, 2009 8:35 AM

## 2010-04-23 NOTE — Miscellaneous (Signed)
  Clinical Lists Changes Pt now listing Dr. Silvio Pate as PCP.  Will not be able to see Dr. Glori Bickers as referred.  Dr. Glori Bickers is not accepting any new pts at this time.  Flag sent to Dr. Everardo Beals office requesting Lorazepam and Synthroid.

## 2010-04-23 NOTE — Progress Notes (Signed)
Summary: RX  Phone Note Refill Request Call back at Home Phone [phone] Message from:  Patient on April 02, 2009 8:18 AM  Refills Requested: Medication #1:  LOTREL 5-40 MG CAPS. [facility] FAX # [phone]  Initial call taken by: Zenovia Jarred,  April 02, 2009 8:18 AM    New/Updated Medications: LOTREL 5-40 MG CAPS (AMLODIPINE BESY-BENAZEPRIL HCL) 1 by mouth daily Prescriptions: LOTREL 5-40 MG CAPS (AMLODIPINE BESY-BENAZEPRIL HCL) 1 by mouth daily  #90 x 3   Entered by:   Philemon Kingdom   Authorized by:   Loralie Champagne, MD   Signed by:   Philemon Kingdom on 04/02/2009   Method used:   Faxed to ...       [facility] (mail-order)       [place], [place]  [id number]       Ph: [phone]       Fax: [phone]   RxID:   [id number] PROCARDIA XL 60 MG XR24H-TAB (NIFEDIPINE) 1 tab by mouth two times a day  #180 x 3   Entered by:   Philemon Kingdom   Authorized by:   Loralie Champagne, MD   Signed by:   Philemon Kingdom on 04/02/2009   Method used:   Print then Give to Patient   RxID:   [id number] PROCARDIA XL 60 MG XR24H-TAB (NIFEDIPINE) 1 tab by mouth two times a day  #60 x 1   Entered by:   Philemon Kingdom   Authorized by:   Loralie Champagne, MD   Signed by:   Philemon Kingdom on 04/02/2009   Method used:   [facility]        [facility] [facility]       [address]       [place], [place]  [address]       Ph: [phone] or [phone]       Fax: [phone]   RxID:   [id number]

## 2010-04-23 NOTE — Progress Notes (Signed)
Summary: BP  Phone Note Call from Patient Call back at Home Phone [phone]   Caller: SELF Call For: [facility] Summary of Call: BP AVERAGING IN MED BETWEEN 140-150/78 DURING THE DAY Initial call taken by: Zenovia Jarred,  January 08, 2009 12:40 PM     Appended Document: BP If BP stays consistently over 140 the rest of this week, would start spironolactone 12.5 mg daily with chem 7 in 2 wks.    Appended Document: BP started today @ pts request.  Will see DM next week. Gabriel Cirri RN BSN

## 2010-04-23 NOTE — Progress Notes (Signed)
Summary: rx refill  Phone Note Refill Request Message from:  Patient on February 07, 2010 4:06 PM  Refills Requested: Medication #1:  LORAZEPAM 1 MG TABS 1/4  by mouth at bedtime [facility]# [phone] fax# [phone]   Method Requested: Fax to Local Pharmacy Initial call taken by: Regan Lemming,  February 07, 2010 4:06 PM  Follow-up for Phone Call       Follow-up by: Doug Sou CMA,  February 11, 2010 8:40 AM    Called Rx  in to [facility].  Authorized a refill of 45 tablets with 1 refill.  This should cover 3 months with 1 additional refill at the current dose.  There have been several conversations with this pt regarding this medication and following up with PCP.  Dr. Aundra Dubin has been authorizing this refill though.  No additional follow up.  Doug Sou, CMA

## 2010-04-23 NOTE — Assessment & Plan Note (Signed)
Summary: from check-out 07-31-09   Visit Type:  Follow-up Referring Provider:  Dixie Dials FNP Primary Provider:  Loura Pardon, MD  CC:  no change.  History of Present Illness: 70 yo with history of COPD and lung cancer and difficult to control hypertension presents for followup.    She was noted to have evidence for hyperaldosteronism by labs.  Dedicated adrenal CT did not show an adrenal adenoma.  Blood pressure continues to be under control on the current regimen. Main complaint recently has been hair loss as well as some itching in her hairline in the back and mouth ulcers.  She is concerned that these are medication side effects. She also reports some drowsiness and fatigue.   Labs (1/11): K 4.6, creatinine 1.59 Labs (3/11): LDL 111, HDL 43 Labs (5/11): K 5, creatinine 1.2  Problems Prior to Update: 1)  Fatigue  (ICD-780.79) 2)  Hypertension, Unspecified  (ICD-401.9) 3)  Hyperaldosteronism Unspecified  (ICD-255.10) 4)  Hyperplasia, Adrenal  (ICD-255.8) 5)  COPD  (ICD-496) 6)  Hypertension, Pulmonary  (ICD-416.8) 7)  Renal Artery Stenosis  (ICD-440.1) 8)  Hypertension, Uncontrolled  (ICD-401.9) 9)  Unspecified Subjective Visual Disturbance  (ICD-368.10) 10)  Eustachian Tube Dysfunction  (ICD-381.81) 11)  Skin Rash  (ICD-782.1) 12)  Preventive Health Care  (ICD-V70.0) 13)  Ibs  (ICD-564.1) 14)  Uti  (ICD-599.0) 15)  Memory Loss  (ICD-780.93) 16)  Pure Hypercholesterolemia  (ICD-272.0) 17)  Memory Loss  (ICD-780.93) 18)  Incontinence, Urge  (ICD-788.31) 19)  Dysuria  (ICD-788.1) 20)  Examination, Routine Medical  (ICD-V70.0) 21)  Skin Rash  (ICD-782.1) 22)  Allergic Reaction  (ICD-995.3) 23)  Goiter  (ICD-240.9) 24)  Osteopenia  (ICD-733.90) 25)  Gerd  (ICD-530.81) 26)  Hypothyroidism  (ICD-244.9) 27)  Hypertension  (ICD-401.9) 28)  Diverticulitis, Hx of  (ICD-V12.79) 29)  Uterine Cancer, Hx of  (ICD-V10.42) 30)  Carcinoma, Lung, Hx of  (ICD-V10.11) 31)   Leiomyoma, Uterus With Lung Mets  (ICD-218.9)  Current Medications (verified): 1)  Synthroid 100 Mcg  Tabs (Levothyroxine Sodium) .... Take 1 Tablet By Mouth Once A Day 2)  Prevacid 15 Mg  Cpdr (Lansoprazole) .... Take 1 Tablet By Mouth Once A Day At Time Not Taking Med 3)  Bystolic 10 Mg Tabs (Nebivolol Hcl) .Marland Kitchen.. 1 Tab By Mouth Two Times A Day 4)  Spironolactone 25 Mg Tabs (Spironolactone) .Marland Kitchen.. 1  Tablet By Mouth Twice Daily 5)  Lorazepam 1 Mg Tabs (Lorazepam) .... 1/4  By Mouth At Bedtime 6)  Clonidine Hcl 0.1 Mg Tabs (Clonidine Hcl) .... One Tablet At Bedtime 7)  Procardia Xl 60 Mg Xr24h-Tab (Nifedipine) .Marland Kitchen.. 1 Tab By Mouth Two Times A Day 8)  Lotrel 5-40 Mg Caps (Amlodipine Besy-Benazepril Hcl) .Marland Kitchen.. 1 Tab By Mouth Daily- Lotrel Only: Dispense As Written 9)  Aspirin 81 Mg Tbec (Aspirin) .... Take One Tablet By Mouth Daily 10)  Bl Balanced Care   Tabs (Multiple Vitamins-Minerals) .... Once Daily 11)  [employer] Vitamin C   Tabs (Ascorbic Acid Tabs) .... Once Daily 12)  Catapres 0.1 Mg Tabs (Clonidine Hcl) .... One At Bedtime  Allergies (verified): 1)  ! Diovan 2)  ! Norvasc 3)  ! * No Antibiotic Without Flagyl  Past History:  Past Medical History: Reviewed history from 07/10/2009 and no changes required. 1. 3/09--IBS with constipation--[facility] 2. HYPERTENSION: Has been difficult to control with spikes at night.  Has had hospitalizations with hypertensive    urgencies. Negative workup for pheochromocytoma with urine and plasma  metanephrines and    catecholeamines normal.  Renal artery dopplers showed no renal artery stenosis.  Serum aldosterone/renin   ratio elevated (aldosterone 23.7, renin <0.15).  CT abdomen (not dedicated to adrenals) in 10/10 read as   "normal adrenals."  Dedicated adrenal CT (11/10) showed no adrenal adenoma.  There was a nodule at the   lung base, followup recommended.  3. HYPOTHYROID 4. GERD 5. Arthritis 6. COPD: quit smoking in Jun 25, 1991.   7. Lung cancer: s/p left  lobectomy in 06-25-1999. 8. Uterine cancer s/p hysterectomy in 06-25-99.  9. Left eye central retinal venous occlusion.  10.  Diverticulosis 11.  Pulmonary hypertension: echo at [facility] (10/10) with EF > 55%, grade II diastolic dysfunction, RSVP > 60        mmHg.  See below for collagen vascular workup.  ACE level was normal.  However, followup echo at      [facility] showed EF 55-60%, mild MR, mild LAE, PASP 25 + RA (no evidence for pulmonary HTN).  12.  Uncharacterized collagen vascular disease: Has seen rheumatology.  Positive rheumatoid factor and      positive serologies for Sjogrens.  C-ANCA and P-ANCA negative, ANA negative, anti-dsDNA negative,     anti-SCL70 negative, anti-centromere antibody negative.   Family History: Reviewed history from 01/04/2009 and no changes required. Father: DECEASED LATE June 25, 2022; PNEUMONIA AND ALCOHOLIC Mother: DECEASED 84 HTN No premature CAD  Social History: Reviewed history from 01/31/2009 and no changes required. Married, 4 children, lives in [place]. Originally from [place]. Tobacco Use - Former, quit 1991-06-25.  Alcohol Use - no Regular Exercise - yes Drug Use - no  Review of Systems       All systems reviewed and negative except as per HPI.   Vital Signs:  Patient profile:   70 year old female Height:      64 inches Weight:      125.50 pounds BMI:     21.62 Pulse rate:   60 / minute BP sitting:   112 / 52  (left arm) Cuff size:   regular  Vitals Entered By: Hansel Feinstein CMA (January 14, 2010 9:53 AM)  Physical Exam  General:  Well developed, well nourished, in no acute distress. Neck:  Neck supple, no JVD. No masses, thyromegaly or abnormal cervical nodes. Lungs:  Clear bilaterally to auscultation and percussion. Heart:  Non-displaced PMI, chest non-tender; regular rate and rhythm, S1, S2 without murmurs, rubs or gallops. Carotid upstroke normal, no bruit.  Pedals normal pulses. No edema, no varicosities. Abdomen:  Bowel sounds positive; abdomen  soft and non-tender without masses, organomegaly, or hernias noted. No hepatosplenomegaly. Extremities:  No clubbing or cyanosis. Neurologic:  Alert and oriented x 3. Psych:  Normal affect.   Impression & Recommendations:  Problem # 1:  HYPERTENSION, UNSPECIFIED (ICD-401.9) BP continues to be stable.  She has a number of complaints including some hair loss, fatigue, itching in the posterior hair line, and mouth ulcers. She wonders if any of these could be due to meds.  Clonidiine could certainly be a culprit for fatigue.  I have wanted to try to get her off clonidine.  I will have her cut it in half for a few  days then stop it.  For the hair loss, will check TSH.  I am not sure what would be causing the itching. No recent med changes.  She can try taking Benadryl before bed (seems to affect her most at night), but would avoid taking lorazepam when she  takes Benadryl.  She will keep an eye on her BP when stopping clonidine and let us know if it starts to run high.   Problem # 2:  COPD ([id number]) Needs followup CT for small nodule seen at left lung base on CT in 11/10.   Other Orders: TLB-BMP (Basic Metabolic Panel-BMET) (99991111) TLB-T4 (Thyrox), Free [phone]) TLB-TSH (Thyroid Stimulating Hormone) (84443-TSH) TLB-T3, Free (Triiodothyronine) (84481-T3FREE)  Patient Instructions: 1)  Your physician recommends that you have lab today---BMP/TSH/Free T4/ Free T3--401.9 2)  Taper Clonidine. 3)  Your physician wants you to follow-up in: 6 months with Dr Aundra Dubin.   You will receive a reminder letter in the mail two months in advance. If you don't receive a letter, please call our office to schedule the follow-up appointment.

## 2010-04-23 NOTE — Progress Notes (Signed)
Summary: schedule chest CT  Phone Note Outgoing Call   Call placed by: Desiree Lucy, RN, BSN,  January 15, 2010 8:47 AM Call placed to: Patient  Follow-up for Phone Call        per Dr Kristine Royal chest CT without contrast to follow-up on pulmonary nodule on abdominal  CT done 01/25/10--I talked with pt --pt wanted to discuss this with her oncologist--I asked pt to call me back after she talked with her oncologist-she states she would [facility] -- I left message with husband for pt to call me to get her decision about scheduling chest CT [facility] --I have been unable to reach pt by telephone--I will mail pt a letter asking pt to call me about scheduling chest CT

## 2010-04-23 NOTE — Letter (Signed)
[facility]: [facility]   Imported By: Edmonia James 08/01/2009 10:29:14  _____________________________________________________________________  External Attachment:    Type:   Image     Comment:   External Document  Appended Document: [facility] post antibiotic diarrhea improved 5 day follow up

## 2010-04-23 NOTE — Progress Notes (Signed)
  Phone Note Call from Patient   Caller: PT Initial call taken by: KM    Spoke w/ Pt this am, she was asking for all her recent records to be faxed, She signed ROI w/ [facility] they forwared Release to [employer], I called Renee she faxed me a copy, I faxed LOV,12,Echo,Vascular,Labs over  to [facility] @ fax [phone].Called and let Pt know records were Faxed.she ok'd  [facility]  Aug 16, 2009 11:47 AM -

## 2010-04-23 NOTE — Progress Notes (Signed)
Summary: refill**[facility]**Please call pt once sent  Phone Note Refill Request Message from:  Patient on September 20, 2009 1:45 PM  Refills Requested: Medication #1:  LORAZEPAM 1 MG TABS 1/2  by mouth at bedtime   Supply Requested: 3 months  Medication #2:  LOTREL 5-40 MG CAPS 1 tab by mouth daily- LOTREL ONLY: DISPENSE AS WRITTEN  Medication #3:  BYSTOLIC 10 MG TABS 1 tab by mouth two times a day  Medication #4:  PROCARDIA XL 60 MG XR24H-TAB 1 tab by mouth two times a day SYNTHROID 100 MCG  TABS (LEVOTHYROXINE SODIUM) Take 1 tablet by mouth once a day CLONIDINE HCL 0.1 MG TABS (CLONIDINE HCL) one tablet at bedtime SPIRONOLACTONE 25 MG TABS (SPIRONOLACTONE) 1  tablet by mouth twice daily [facility] Fax [phone], Please call pt once sent   Method Requested: Fax to Mail Away Pharmacy    Prescriptions: PROCARDIA XL 60 MG XR24H-TAB (NIFEDIPINE) 1 tab by mouth two times a day  #180 x 3   Entered by:   Doug Sou CMA   Authorized by:   Loralie Champagne, MD   Signed by:   Doug Sou CMA on 09/20/2009   Method used:   Faxed to ...       [facility] (mail-order)             , [place]         Ph: [phone]       Fax: [phone]   RxID:   [id number] LOTREL 5-40 MG CAPS (AMLODIPINE BESY-BENAZEPRIL HCL) 1 tab by mouth daily- LOTREL ONLY: DISPENSE AS WRITTEN  #90 x 3   Entered by:   Doug Sou CMA   Authorized by:   Loralie Champagne, MD   Signed by:   Doug Sou CMA on 09/20/2009   Method used:   Faxed to ...       [facility] (mail-order)             , [place]         Ph: [phone]       Fax: [phone]   RxID:   [id number] BYSTOLIC 10 MG TABS (NEBIVOLOL HCL) 1 tab by mouth two times a day  #180 x 3   Entered by:   Doug Sou CMA   Authorized by:   Loralie Champagne, MD   Signed by:   Doug Sou CMA on 09/20/2009   Method used:   Faxed to ...       [facility] (mail-order)             , [place]         Ph: [phone]  Fax: [phone]   RxID:   [id number]   Appended Document: refill**[facility]**Please call pt once sent Called pt as her Lorazepam should be followed by PCP.  Pt was given referral to Dr. Glori Bickers.  Filled 3 other cardiac meds req. for 3 month supply to [facility].  Called and left msg on machine to call me back.  Doug Sou, CMA

## 2010-04-23 NOTE — Assessment & Plan Note (Signed)
Summary: rov   Primary Provider:  Loura Pardon, MD  CC:  rov/pt refuses EKG as she is only here for her BP according to her.  She also wants [place] to have Dr. Liliane Channel records .  History of Present Illness: 70 yo with history of COPD and lung cancer and difficult to control hypertension presents for followup.    She was noted to have evidence for hyperaldosteronism by labs.  Dedicated adrenal CT did not show an adrenal adenoma.  Blood pressure continues to be under control on the current regimen.  Her main complaint recently had been chronic diarrhea.  This seems to have resolved with probiotics.  She is walking without shortness of breath or chest pain.    Labs (1/11): K 4.6, creatinine 1.59 Labs (3/11): LDL 111, HDL 43 Labs (5/11): K 5, creatinine 1.2  Current Medications (verified): 1)  Synthroid 100 Mcg  Tabs (Levothyroxine Sodium) .... Take 1 Tablet By Mouth Once A Day 2)  Prevacid 15 Mg  Cpdr (Lansoprazole) .... Take 1 Tablet By Mouth Once A Day At Time Not Taking Med 3)  Flexeril 10 Mg Tabs (Cyclobenzaprine Hcl) .... Take One By Mouth At Bedtime As Needed 4)  Bystolic 10 Mg Tabs (Nebivolol Hcl) .Marland Kitchen.. 1 Tab By Mouth Two Times A Day 5)  Spironolactone 25 Mg Tabs (Spironolactone) .Marland Kitchen.. 1  Tablet By Mouth Twice Daily 6)  Lorazepam 1 Mg Tabs (Lorazepam) .... 1/2  By Mouth At Bedtime 7)  Clonidine Hcl 0.1 Mg Tabs (Clonidine Hcl) .... One Tablet At Bedtime 8)  Procardia Xl 60 Mg Xr24h-Tab (Nifedipine) .Marland Kitchen.. 1 Tab By Mouth Two Times A Day 9)  Lotrel 5-40 Mg Caps (Amlodipine Besy-Benazepril Hcl) .Marland Kitchen.. 1 Tab By Mouth Daily- Lotrel Only: Dispense As Written 10)  Calcium 600   Tabs (Calcium Carbonate Tabs) .... Take 1 Tablet By Mouth Two Times A Day 11)  Aspirin 81 Mg Tbec (Aspirin) .... Take One Tablet By Mouth Daily 12)  Bl Balanced Care   Tabs (Multiple Vitamins-Minerals) .... Once Daily 13)  [employer] Vitamin C   Tabs (Ascorbic Acid Tabs) .... Once Daily 14)  Probiotic  Caps (Probiotic Product)  .... Take One Capsule Daily  Allergies (verified): 1)  ! Diovan 2)  ! Norvasc 3)  ! * No Antibiotic Without Flagyl  Past History:  Past Medical History: Reviewed history from 07/10/2009 and no changes required. 1. 3/09--IBS with constipation--[facility] 2. HYPERTENSION: Has been difficult to control with spikes at night.  Has had hospitalizations with hypertensive    urgencies. Negative workup for pheochromocytoma with urine and plasma metanephrines and    catecholeamines normal.  Renal artery dopplers showed no renal artery stenosis.  Serum aldosterone/renin   ratio elevated (aldosterone 23.7, renin <0.15).  CT abdomen (not dedicated to adrenals) in 10/10 read as   "normal adrenals."  Dedicated adrenal CT (11/10) showed no adrenal adenoma.  There was a nodule at the   lung base, followup recommended.  3. HYPOTHYROID 4. GERD 5. Arthritis 6. COPD: quit smoking in 1993.   7. Lung cancer: s/p left lobectomy in 2001. 8. Uterine cancer s/p hysterectomy in 2001.  9. Left eye central retinal venous occlusion.  10.  Diverticulosis 11.  Pulmonary hypertension: echo at [facility] (10/10) with EF > 55%, grade II diastolic dysfunction, RSVP > 60        mmHg.  See below for collagen vascular workup.  ACE level was normal.  However, followup echo at      [facility] showed  EF 55-60%, mild MR, mild LAE, PASP 25 + RA (no evidence for pulmonary HTN).  12.  Uncharacterized collagen vascular disease: Has seen rheumatology.  Positive rheumatoid factor and      positive serologies for Sjogrens.  C-ANCA and P-ANCA negative, ANA negative, anti-dsDNA negative,     anti-SCL70 negative, anti-centromere antibody negative.   Family History: Reviewed history from 01/04/2009 and no changes required. Father: DECEASED LATE 2022/06/26; PNEUMONIA AND ALCOHOLIC Mother: DECEASED 84 HTN No premature CAD  Social History: Reviewed history from 01/31/2009 and no changes required. Married, 4 children, lives in [place]. Originally from  [place]. Tobacco Use - Former, quit 1991/06/26.  Alcohol Use - no Regular Exercise - yes Drug Use - no  Vital Signs:  Patient profile:   70 year old female Height:      64 inches Weight:      130 pounds BMI:     22.40 Pulse rate:   62 / minute Pulse rhythm:   regular BP sitting:   122 / 64  (left arm) Cuff size:   regular  Vitals Entered By: Doug Sou CMA (Jul 31, 2009 4:21 PM)  Physical Exam  General:  Well developed, well nourished, in no acute distress. Neck:  Neck supple, no JVD. No masses, thyromegaly or abnormal cervical nodes. Lungs:  Clear bilaterally to auscultation and percussion. Heart:  Non-displaced PMI, chest non-tender; regular rate and rhythm, S1, S2 without murmurs, rubs or gallops. Carotid upstroke normal, no bruit.  Pedals normal pulses. No edema, no varicosities. Abdomen:  Bowel sounds positive; abdomen soft and non-tender without masses, organomegaly, or hernias noted. No hepatosplenomegaly. Extremities:  No clubbing or cyanosis. Neurologic:  Alert and oriented x 3. Psych:  Normal affect.   Impression & Recommendations:  Problem # 1:  HYPERTENSION, UNSPECIFIED (ICD-401.9) Continues to be stable.  Patient now has some trace lower extremity edema.  She is on a lot of calcium channel blocker, which may be the culprit.  I will leave her on the current regimen as it has been successful despite being a little unorthodox.  K is upper normal today (likely due to spironolactone).  She will cut back on K-rich foods.    Other Orders: Primary Care Referral (Primary) TLB-BMP (Basic Metabolic Panel-BMET) (99991111)  Patient Instructions: 1)  You have been referred to Dr Glori Bickers at [facility] 2)  Your physician recommends that you have lab today---BMP 401.9 3)  Your physician wants you to follow-up in: 6 months with Dr Aundra Dubin.  You will receive a reminder letter in the mail two months in advance. If you don't receive a letter, please call our office to  schedule the follow-up appointment.

## 2010-04-23 NOTE — Progress Notes (Signed)
Summary: not any better  Phone Note Call from Patient Call back at Home Phone [phone]   Caller: Patient Summary of Call: Pt was seen on 3/31 and she says she is not any better.  Has fever, congestion, chills, coughing to the point of vomiting.  She is requesting an antibiotic be sent to [facility]. Initial call taken by: Marty Heck CMA,  June 25, 2009 8:10 AM  Follow-up for Phone Call        Please encourage to CONTINUE everything else she was told to take and do. Merely add antibiotic. Follow-up by: Raenette Rover MD,  June 25, 2009 8:14 AM  Additional Follow-up for Phone Call Additional follow up Details #1::        Advised pt. Additional Follow-up by: Marty Heck CMA,  June 25, 2009 8:59 AM    New/Updated Medications: AUGMENTIN 500-125 MG TABS (AMOXICILLIN-POT CLAVULANATE) one tab by mouth three times a day Prescriptions: AUGMENTIN 500-125 MG TABS (AMOXICILLIN-POT CLAVULANATE) one tab by mouth three times a day  #30 x 0   Entered and Authorized by:   Raenette Rover MD   Signed by:   Raenette Rover MD on 06/25/2009   Method used:   Electronically to        [facility] [address] [facility]retail)       [address]       [place], [place]  [address]       Ph: [phone] or [phone]       Fax: [phone]   RxID:   [id number]

## 2010-04-23 NOTE — Progress Notes (Signed)
Summary: RX  Phone Note Refill Request Call back at Home Phone [phone] Message from:  Patient on April 06, 2009 9:26 AM  Refills Requested: Medication #1:  BYSTOLIC 10 MG TABS 1 tab by mouth two times a day WOULD LIKE A 5 DAY RX CALLED INTO [facility] HER MAIL ORDER HAS NOT BEEN SENT YET  Initial call taken by: Zenovia Jarred,  April 06, 2009 9:27 AM    Prescriptions: BYSTOLIC 10 MG TABS (NEBIVOLOL HCL) 1 tab by mouth two times a day  #60 x 1   Entered by:   Philemon Kingdom   Authorized by:   Loralie Champagne, MD   Signed by:   Philemon Kingdom on 04/06/2009   Method used:   [facility]        [facility] [address] [facility]       [address]       [place], [place]  [address]       Ph: [phone] or [phone]       Fax: [phone]   RxID:   [id number]

## 2010-04-23 NOTE — Progress Notes (Signed)
Summary: refill  Phone Note Refill Request Message from:  Patient on Aug 06, 2009 11:07 AM  Refills Requested: Medication #1:  LORAZEPAM 1 MG TABS 1/2  by mouth at bedtime [facility] [phone] pt needs 90 supply  Initial call taken by: Delsa Sale,  Aug 06, 2009 11:09 AM  Follow-up for Phone Call        Rx already sent to patient per patient request.  Pt will forward to mail order pharmacy

## 2010-04-23 NOTE — Progress Notes (Signed)
Summary: Records Request  Phone Note Call from Patient Call back at Home Phone [phone]   Caller: Patient Call For: Margaret Fabian Sharp FNP Summary of Call: Patient phoned in requesting that her recent culture, labs and OV notes be faxed to Dr. Liliane Channel office.  She also asked that all the notes that were recently sent to Dr. Claris Gladden office also be forwarded to Dr. Earlean Shawl.  I explained that we may need  written aurthorization in order to do this and she said she would come by the office later to sign an authorization or would send a signed request to be delivered by her husband to our office.  Is this acceptable? Initial call taken by: Christena Deem CMA Margaret Black),  July 16, 2009 9:14 AM  Follow-up for Phone Call        Phone Call Complete.Marland KitchenMarland KitchenWe received the pts ltr, however, we need the medical records form filled out. Linda sp w/ pt and explained, she will be coming into the office to fill the correct form out...Marland KitchenMarland Kitchenthanks  for your help.Allen Norris  July 16, 2009 9:42 AM  Follow-up by: Allen Norris,  July 16, 2009 9:42 AM

## 2010-04-23 NOTE — Letter (Signed)
Summary: [facility]   Imported By: Edmonia James 02/04/2010 11:07:26  _____________________________________________________________________  External Attachment:    Type:   Image     Comment:   External Document  Appended Document: [facility] no evidence of recurrent leiomyosarcoma of uterus CT shows stable lung nodules

## 2010-04-23 NOTE — Letter (Signed)
[facility]   Imported By: Edmonia James 08/06/2009 11:00:17  _____________________________________________________________________  External Attachment:    Type:   Image     Comment:   External Document  Appended Document: [facility] diarrhea improved donnatal for as needed  floroaster for another couple of weeks miralax 1 tbspn daily follow up as needed

## 2010-04-23 NOTE — Assessment & Plan Note (Signed)
Summary: STREP THROAT/RBH   Vital Signs:  Patient profile:   70 year old female Weight:      135.25 pounds Temp:     98.2 degrees F oral Pulse rate:   60 / minute Pulse rhythm:   regular BP sitting:   110 / 70  (left arm) Cuff size:   regular  Vitals Entered By: Emelia Salisbury LPN (March 31, 624THL [id number] AM) CC: ? Strep throat, very bad sore throat, chills and exposed to strep by her grandkids   History of Present Illness: Pt here for nbad sore throat. She has had fever to 101 and has hot potatoe voice (but found out she had a lozenge in her mouth), ST for 36 hrs, tickling for 48 hrs prior. She has had chills, she has no headache, some ear pain in the right with sneezing and coughing. She has runny nose that she thinks is from her BP medication but thinks her nose is running a llittle bit more. She has no sneezing and no itchy eyes. She is coughing minimally, no SOB, no N/V different...she has nausea all the time from her medications.  She has taken nothing. She had been with her granddaughter in [place] for 24 hrs taking her everywhere including dance lessons which was "like sitting in an ER waiting room".   Problems Prior to Update: 1)  Fatigue  (ICD-780.79) 2)  Hypertension, Unspecified  (ICD-401.9) 3)  Hyperaldosteronism Unspecified  (ICD-255.10) 4)  Hyperplasia, Adrenal  (ICD-255.8) 5)  COPD  (ICD-496) 6)  Hypertension, Pulmonary  (ICD-416.8) 7)  Renal Artery Stenosis  (ICD-440.1) 8)  Hypertension, Uncontrolled  (ICD-401.9) 9)  Unspecified Subjective Visual Disturbance  (ICD-368.10) 10)  Eustachian Tube Dysfunction  (ICD-381.81) 11)  Skin Rash  (ICD-782.1) 12)  Preventive Health Care  (ICD-V70.0) 13)  Ibs  (ICD-564.1) 14)  Uti  (ICD-599.0) 15)  Memory Loss  (ICD-780.93) 16)  Pure Hypercholesterolemia  (ICD-272.0) 17)  Memory Loss  (ICD-780.93) 18)  Incontinence, Urge  (ICD-788.31) 19)  Dysuria  (ICD-788.1) 20)  Examination, Routine Medical  (ICD-V70.0) 21)  Skin Rash   (ICD-782.1) 22)  Allergic Reaction  (ICD-995.3) 23)  Goiter  (ICD-240.9) 24)  Osteopenia  (ICD-733.90) 25)  Gerd  (ICD-530.81) 26)  Hypothyroidism  (ICD-244.9) 27)  Hypertension  (ICD-401.9) 28)  Diverticulitis, Hx of  (ICD-V12.79) 29)  Uterine Cancer, Hx of  (ICD-V10.42) 30)  Carcinoma, Lung, Hx of  (ICD-V10.11) 31)  Leiomyoma, Uterus With Lung Mets  (ICD-218.9)  Medications Prior to Update: 1)  Synthroid 100 Mcg  Tabs (Levothyroxine Sodium) .... Take 1 Tablet By Mouth Once A Day 2)  Prevacid 15 Mg  Cpdr (Lansoprazole) .... Take 1 Tablet By Mouth Once A Day At Time Not Taking Med 3)  Calcium 600   Tabs (Calcium Carbonate Tabs) .... Take 1 Tablet By Mouth Two Times A Day 4)  Bl Balanced Care   Tabs (Multiple Vitamins-Minerals) .... Once Daily 5)  [employer] Vitamin C   Tabs (Ascorbic Acid Tabs) .... Once Daily 6)  Flexeril 10 Mg Tabs (Cyclobenzaprine Hcl) .... Take One By Mouth At Bedtime As Needed 7)  Aspirin 81 Mg Tbec (Aspirin) .... Take One Tablet By Mouth Daily 8)  Bystolic 10 Mg Tabs (Nebivolol Hcl) .Marland Kitchen.. 1 Tab By Mouth Two Times A Day 9)  Spironolactone 25 Mg Tabs (Spironolactone) .Marland Kitchen.. 1  Tablet By Mouth Twice Daily 10)  Lorazepam 1 Mg Tabs (Lorazepam) .... 1/2  By Mouth At Bedtime 11)  Clonidine Hcl 0.1 Mg Tabs (Clonidine  Hcl) .... One Tablet At Bedtime 12)  Procardia Xl 60 Mg Xr24h-Tab (Nifedipine) .Marland Kitchen.. 1 Tab By Mouth Two Times A Day 13)  Lotrel 5-40 Mg Caps (Amlodipine Besy-Benazepril Hcl) .Marland Kitchen.. 1 Tab By Mouth Daily- Lotrel Only: Dispense As Written  Allergies: 1)  ! Diovan 2)  ! Norvasc  Physical Exam  General:  Well developed, well nourished, in no acute distress, nontoxic and pleasant, mildly congested. Head:  Normocephalic and atraumatic without obvious abnormalities. No apparent alopecia or balding. Eyes:  Conjunctiva clear bilaterally.  Ears:  External ear exam shows no significant lesions or deformities.  Otoscopic examination reveals clear canals, tympanic  membranes are intact bilaterally without bulging, retraction, inflammation or discharge. Hearing is grossly normal bilaterally. R ear very nml. Nose:  no deformity, discharge  or lesions. Mild clear thick mucous bilat. Mouth:  Teeth, gums and palate normal. Oral mucosa normal. Pharynx benign with minimal erythema, no exudate. Mild thick, clear PND. Neck:  Neck supple, no JVD. No masses, thyromegaly or abnormal cervical nodes. Chest Wall:  No deformities, masses, or tenderness noted. Lungs:  Clear bilaterally to auscultation and percussion. Heart:  Non-displaced PMI, chest non-tender; regular rate and rhythm, S1, S2 without murmur. +S4.  Carotid upstroke normal, no bruit. Pedals normal pulses. 1+ ankle edema bilaterally.    Impression & Recommendations:  Problem # 1:  URI (ICD-465.9) Assessment New  RST neg.   See instructions. Her updated medication list for this problem includes:    Aspirin 81 Mg Tbec (Aspirin) .Marland Kitchen... Take one tablet by mouth daily  Instructed on symptomatic treatment. Call if symptoms persist or worsen.   Orders: Rapid Strep [id number])  Complete Medication List: 1)  Synthroid 100 Mcg Tabs (Levothyroxine sodium) .... Take 1 tablet by mouth once a day 2)  Prevacid 15 Mg Cpdr (Lansoprazole) .... Take 1 tablet by mouth once a day at time not taking med 3)  Calcium 600 Tabs (Calcium carbonate tabs) .... Take 1 tablet by mouth two times a day 4)  Bl Balanced Care Tabs (Multiple vitamins-minerals) .... Once daily 5)  [employer] Vitamin C Tabs (Ascorbic acid tabs) .... Once daily 6)  Flexeril 10 Mg Tabs (Cyclobenzaprine hcl) .... Take one by mouth at bedtime as needed 7)  Aspirin 81 Mg Tbec (Aspirin) .... Take one tablet by mouth daily 8)  Bystolic 10 Mg Tabs (Nebivolol hcl) .Marland Kitchen.. 1 tab by mouth two times a day 9)  Spironolactone 25 Mg Tabs (Spironolactone) .Marland Kitchen.. 1  tablet by mouth twice daily 10)  Lorazepam 1 Mg Tabs (Lorazepam) .... 1/2  by mouth at bedtime 11)  Clonidine  Hcl 0.1 Mg Tabs (Clonidine hcl) .... One tablet at bedtime 12)  Procardia Xl 60 Mg Xr24h-tab (Nifedipine) .Marland Kitchen.. 1 tab by mouth two times a day 13)  Lotrel 5-40 Mg Caps (Amlodipine besy-benazepril hcl) .Marland Kitchen.. 1 tab by mouth daily- lotrel only: dispense as written  Patient Instructions: 1)  Take Guaifenesin by going to [facility] or [facility] and getting MUCOUS RELIEF EXPECTORANT (400mg ), take 11/2 tabs by mouth AM and NOON. 2)  Drink lots of fluids anytime taking Guaifenesin.  3)  Tyl ES 2 tabs by mouth three times a day  4)  Keep lozenge in mouth  5)  Gargle every half hour for two days.  Current Allergies (reviewed today): ! DIOVAN ! [facility]  Laboratory Results  Date/Time Received: June 21, 2009 9:31 AM  Date/Time Reported: June 21, 2009 9:31 AM   Other Tests  Rapid Strep: negative  Kit Test Internal QC: Positive   (Normal Range: Negative)

## 2010-04-23 NOTE — Progress Notes (Signed)
Summary: LABS  Phone Note Call from Patient Call back at Home Phone [phone]   Caller: SELF Call For: [facility] Summary of Call: PT IS DUE FOR A THYROID CHECKED AND WOULD [place] IT TO BE ADDED ONTO HER LAB ORDER AT [facility] Initial call taken by: Zenovia Jarred,  May 16, 2009 10:00 AM  Follow-up for Phone Call        Patient has no pending labs scheduled here at [facility]. We can do a lab draw here but need orders and dx please. Also, Billie Bean has retierd and is no longer here. Follow-up by: Selinda Orion,  May 16, 2009 10:52 AM     Appended Document: LABS    Clinical Lists Changes  Problems: Added new problem of FATIGUE (ICD-780.79) Orders: Added new Test order of T-TSH [phone]) - Signed Added new Test order of T-T3, Free [phone]) - Signed Added new Test order of T-T4, Free [phone]) - Signed

## 2010-04-25 NOTE — Miscellaneous (Signed)
Summary: Vaccine Consent & Administration Record  Vaccine Consent & Administration Record   Imported By: Sallee Provencal 03/14/2010 15:22:24  _____________________________________________________________________  External Attachment:    Type:   Image     Comment:   External Document

## 2010-04-25 NOTE — Assessment & Plan Note (Signed)
Summary: ROA TO ESTABLISH WITH Margaret Black/JRR   Vital Signs:  Patient profile:   70 year old female Height:      64 inches Weight:      133.75 pounds BMI:     23.04 Temp:     98 degrees F oral Pulse rate:   90 / minute Pulse rhythm:   regular BP sitting:   112 / 56  (left arm) Cuff size:   regular  Vitals Entered By: [place] Deborra Medina) (March 28, 2010 12:01 PM) CC: Establish with Dr. Damita Dunnings   History of Present Illness: H/o C diff.  Fourtunately this is resolved.    Htn followed by cards.  Still with some bilateral lower extremity edeam but no CP, not short of breath.    H/o osteopenia and due for DXA.  She has difficulty with Ca++ due to GI side effects.  Asking for advice.  She would be open to tx for osteoporosis, should her DXA be much worse.    H/o goiter.  No ant neck pain and no recent changes in swallowing.  TSH recently wnl.   Allergies: 1)  ! Diovan 2)  ! Norvasc 3)  ! * No Antibiotic Without Flagyl  Past History:  Past Medical History: 1. 3/09--IBS with constipation--[facility] 2. HYPERTENSION: Has been difficult to control with spikes at night.  Has had hospitalizations with hypertensive    urgencies. Negative workup for pheochromocytoma with urine and plasma metanephrines and    catecholeamines normal.  Renal artery dopplers showed no renal artery stenosis.  Serum aldosterone/renin   ratio elevated (aldosterone 23.7, renin <0.15).  CT abdomen (not dedicated to adrenals) in 10/10 read as   "normal adrenals."  Dedicated adrenal CT (11/10) showed no adrenal adenoma.  There was a nodule at the   lung base, followup recommended.  3. HYPOTHYROID 4. GERD 5. Arthritis 6. COPD: quit smoking in 1993.   7. Lung cancer: s/p left lobectomy in 2001. 8. Uterine cancer s/p hysterectomy in 2001.  9. Left eye central retinal venous occlusion- legally blind in L eye. 10.  Diverticulosis 11.  Pulmonary hypertension: echo at [facility] (10/10) with EF > 55%, grade II diastolic  dysfunction, RSVP > 60        mmHg.  See below for collagen vascular workup.  ACE level was normal.  However, followup echo at      [facility] showed EF 55-60%, mild MR, mild LAE, PASP 25 + RA (no evidence for pulmonary HTN).  12.  Uncharacterized collagen vascular disease: Has seen rheumatology.  Positive rheumatoid factor and      positive serologies for Sjogrens.  C-ANCA and P-ANCA negative, ANA negative, anti-dsDNA negative,     anti-SCL70 negative, anti-centromere antibody negative.  13.  Insomnia/anxiety- related to HTN and vision loss.  As of 2012 has weaned down to 0.25mg  of lorazepam at night.  other MDs: Aundra Dubin cardiology, Christena Deem at [facility], [facility], ophtho- [facility]  Past Surgical History: RIGHT LUNG LOBECTOMY 02/01 HYSTERECTOMY 04/01 LEFT LUNG OK S/P SURGERY 06/01 CHOLECYSTECTOMY [place] LUNG LEFT SIDE SURGERY BENIGN 2004 CT chest--01/27/2006  enlarged thyroid extending into thoracis inlet (goiter) CT chest 01/2003  neg except for substernal thyroid and linear scarring rith upper lobe and left lower lobe CT head --12/30/2008--neg CT abd and pelvis--12/30/2008--neg except divertic Kidney scan--slow washout of R kidney, otherwise neg 2D transthoracic [id number] 2 diastolic dysfunction and severe pulm HTN, EF>55%  Family History: Reviewed history from 01/04/2009  and no changes required. Father: DECEASED LATE July 16, 2022; PNEUMONIA AND ALCOHOLIC, minimal contact Mother: DECEASED 84 HTN No premature CAD  Social History: Reviewed history from 01/31/2009 and no changes required. Married 07-15-1960, 4 children, lives [place]  Tobacco Use - Former, quit Jul 16, 1991.  Alcohol Use - no Regular Exercise - yes Drug Use - no enjoys playing bridge  Review of Systems       See HPI.  Otherwise negative.    Physical Exam  General:  no apparent distress normocephalic atraumatic mucous membranes  moist neck supply, goiter noted regular rate and rhythm clear to auscultation bilaterally ext with trace edema bilaterally   Impression & Recommendations:  Problem # 1:  OSTEOPENIA (ICD-733.90)  >25 min spent with patient, at least half of which was spent on counseling [id number].  Will send for DXA and we can address the results when available.  Will try to increase Ca++ with tums, as much as tolerated.  The following medications were removed from the medication list:    Calcium Carbonate-vitamin D 600-400 Mg-unit Tabs (Calcium carbonate-vitamin d) .Marland Kitchen... Take 1 tablet by mouth two times a day  Orders: Radiology Referral (Radiology)  Problem # 2:  HYPOTHYROIDISM (ICD-244.9) Recent TSH wnl and no change in symptoms.  Would recheck TSH yearly and work up further if symptoms change.  She agrees.  Her updated medication list for this problem includes:    Synthroid 100 Mcg Tabs (Levothyroxine sodium) .Marland Kitchen... Take 1 tablet by mouth once a day  Complete Medication List: 1)  Synthroid 100 Mcg Tabs (Levothyroxine sodium) .... Take 1 tablet by mouth once a day 2)  Prevacid 15 Mg Cpdr (Lansoprazole) .... Take 1 tablet by mouth once a day at time not taking med 3)  Bystolic 10 Mg Tabs (Nebivolol hcl) .Marland Kitchen.. 1 tab by mouth two times a day 4)  Spironolactone 25 Mg Tabs (Spironolactone) .Marland Kitchen.. 1  tablet by mouth twice daily 5)  Lorazepam 1 Mg Tabs (Lorazepam) .... 1/4  by mouth at bedtime 6)  Clonidine Hcl 0.1 Mg Tabs (Clonidine hcl) .... One tablet at bedtime 7)  Procardia Xl 60 Mg Xr24h-tab (Nifedipine) .Marland Kitchen.. 1 tab by mouth two times a day 8)  Lotrel 5-40 Mg Caps (Amlodipine besy-benazepril hcl) .Marland Kitchen.. 1 tab by mouth daily- lotrel only: dispense as written 9)  Aspirin 81 Mg Tbec (Aspirin) .... Take one tablet by mouth daily 10)  Bl Balanced Care Tabs (Multiple vitamins-minerals) .... Once daily 11)  [employer] Vitamin C Tabs (Ascorbic acid tabs) .... Once daily 12)  Probiotic Caps (Probiotic product) ....  Take 1 capsule by mouth once a day  Patient Instructions: 1)  Call back and talk to [facility] about your referral. 2)  Take the tums as best you can (1/2 a day, every other day). 3)  I would recheck your labs in 11/12.  Call to set up a 10min appointment.  We can set up your labs before the visit.    Orders Added: 1)  Est. Patient Level IV [id number] 2)  Radiology Referral [Radiology]    Current Allergies (reviewed today): ! DIOVAN ! NORVASC ! * NO ANTIBIOTIC WITHOUT FLAGYL

## 2010-05-03 ENCOUNTER — Telehealth: Payer: Self-pay | Admitting: Family Medicine

## 2010-05-09 NOTE — Progress Notes (Signed)
Summary: bone density  ---- Converted from flag ---- ---- 05/03/2010 1:44 PM, Elsie Stain MD wrote: She has osteopenia, but not osteoporosis.  I would continue calcium and vitamin D.  recheck dxa in 2 years. please notify patient and then conver to note in the chart.   ---- 05/03/2010 1:37 PM, Lacretia Nicks wrote: Please review patient's bone density report. She needs the results withing the next hour because of a dental appt. Thanks ------------------------------  Patient was notified.

## 2010-06-27 ENCOUNTER — Telehealth: Payer: Self-pay | Admitting: Cardiology

## 2010-06-27 LAB — POCT I-STAT, CHEM 8
Calcium, Ion: 1.2 mmol/L (ref 1.12–1.32)
Creatinine, Ser: 0.8 mg/dL (ref 0.4–1.2)
Glucose, Bld: 98 mg/dL (ref 70–99)
HCT: 30 % — ABNORMAL LOW (ref 36.0–46.0)

## 2010-06-27 NOTE — Telephone Encounter (Signed)
Pt trying to get refill for lotrol and insurance needs prior auth -pt running out and can't get refill until auth done-att [employer] clinical prescriptions benefits fax [phone] or phone [phone]

## 2010-06-27 NOTE — Telephone Encounter (Signed)
I talked with pt. Pt requesting Dr Aundra Dubin contact insurance company to obtain prior auth for brand name Lotrel. Pt states she has been on numerous medications for her BP and has been unable to tolerate them because of side effects. I called [employer] prescription benefit [phone] and talked with [facility]. He is faxing prior auth form to the [place] office. [phone]. Pt has an appt with Dr Aundra Dubin in the [facility] office 07/05/10 and is aware the prior auth form will be faxed to that office.

## 2010-07-01 NOTE — Telephone Encounter (Signed)
Spoke with [place] @ [employer]. [name] information of which medications the pt has tried and side effects per last OV. Lisabeth Pick stated that it take 24 to 72 hours for it to be authorized and they will send a fax to let us know. Pt notified.

## 2010-07-05 ENCOUNTER — Encounter: Payer: Self-pay | Admitting: Cardiology

## 2010-07-05 ENCOUNTER — Ambulatory Visit (INDEPENDENT_AMBULATORY_CARE_PROVIDER_SITE_OTHER): Payer: Medicare PPO | Admitting: Cardiology

## 2010-07-05 VITALS — BP 122/70 | HR 64 | Ht 65.0 in | Wt 134.4 lb

## 2010-07-05 DIAGNOSIS — E269 Hyperaldosteronism, unspecified: Secondary | ICD-10-CM

## 2010-07-05 DIAGNOSIS — I10 Essential (primary) hypertension: Secondary | ICD-10-CM

## 2010-07-05 MED ORDER — NEBIVOLOL HCL 5 MG PO TABS: 5.0000 mg | ORAL_TABLET | Freq: Two times a day (BID) | ORAL | Status: DC

## 2010-07-05 NOTE — Patient Instructions (Addendum)
Decrease Bystolic 5mg  twice daily. We will send you a letter with your lab results that were drawn today. Please follow up with Dr. Steva Colder at [place] [phone]

## 2010-07-06 LAB — BASIC METABOLIC PANEL WITH GFR
BUN: 33 mg/dL — ABNORMAL HIGH (ref 6–23)
CO2: 24 meq/L (ref 19–32)
Calcium: 9.8 mg/dL (ref 8.4–10.5)
Chloride: 103 meq/L (ref 96–112)
Creat: 1.46 mg/dL — ABNORMAL HIGH (ref 0.40–1.20)
Glucose, Bld: 118 mg/dL — ABNORMAL HIGH (ref 70–99)
Potassium: 5.4 meq/L — ABNORMAL HIGH (ref 3.5–5.3)
Sodium: 138 meq/L (ref 135–145)

## 2010-07-07 NOTE — Assessment & Plan Note (Signed)
Hyperaldosteronism by labs.  This likely helps to explain her resistant hypertension.  She will continue spironolactone.

## 2010-07-07 NOTE — Assessment & Plan Note (Signed)
BP continues to be stable despite stopping clonidine.  She has a number of complaints including some hair loss, fatigue, itching, and mouth ulcers. Recent TSH was normal.  She thinks that these symptoms are due to her medications.  Coming off clonidine has not helped.  I will let her try cutting Bystolic in half to 5 mg bid.  We will call her in 2 wks to see what her BP is running.  As these symptoms could also be related to her undifferentiated collagen vascular disease, I will have her go back for evaluation by Dr. Estanislado Pandy.

## 2010-07-07 NOTE — Progress Notes (Signed)
PCP: Dr. Damita Dunnings  70 yo with history of COPD, lung cancer, and difficult to control hypertension presents for followup.  She was noted to have evidence for hyperaldosteronism by labs.  Dedicated adrenal CT did not show an adrenal adenoma.  Blood pressure continues to be under good control on the current regimen. She has actually been able to stop clonidine without any significant rise in her blood pressure.  Main complaint over the last few months has been hair loss, generalized pruritis, fatigue, and mouth ulcers.  She is concerned that these are medication side effects. Stopping clonidine did not help symptoms.    Labs (1/11): K 4.6, creatinine 1.59 Labs (3/11): LDL 111, HDL 43 Labs (5/11): K 5, creatinine 1.2 Labs (10/11): K 5.2, creatinine 1.3, TSH normal  Allergies (verified):  1)  ! Diovan 2)  ! Norvasc 3)  ! * No Antibiotic Without Flagyl  Past Medical History: 1. IBS with constipation--[facility] 2. HYPERTENSION: Has been difficult to control with spikes at night.  Has had hospitalizations with hypertensive    urgencies. Negative workup for pheochromocytoma with urine and plasma metanephrines and    catecholeamines normal.  Renal artery dopplers showed no renal artery stenosis.  Serum aldosterone/renin   ratio elevated (aldosterone 23.7, renin <0.15).  CT abdomen (not dedicated to adrenals) in 10/10 read as   "normal adrenals."  Dedicated adrenal CT (11/10) showed no adrenal adenoma.  There was a nodule at the   lung base, followup recommended.  3. HYPOTHYROID 4. GERD 5. Arthritis 6. COPD: quit smoking in 26-Jun-1991.   7. Lung cancer: s/p left lobectomy in 06-26-99. 8. Uterine cancer s/p hysterectomy in 06-26-99.  9. Left eye central retinal venous occlusion.  10.  Diverticulosis 11.  Pulmonary hypertension: echo at [facility] (10/10) with EF > 55%, grade II diastolic dysfunction, RSVP > 60        mmHg.  See below for collagen vascular workup.  ACE level was normal.  However, followup echo at      [facility]  showed EF 55-60%, mild MR, mild LAE, PASP 25 + RA (no evidence for pulmonary HTN).  12.  Uncharacterized collagen vascular disease: Has seen rheumatology.  Positive rheumatoid factor and positive serologies for Sjogrens.  C-ANCA and P-ANCA negative, ANA negative, anti-dsDNA negative,     anti-SCL70 negative, anti-centromere antibody negative.   Family History: Father: DECEASED LATE 06/26/2022; PNEUMONIA AND ALCOHOLIC Mother: DECEASED 84 HTN No premature CAD  Social History: Married, 4 children, lives in [place]. Originally from [place]. Tobacco Use - Former, quit Jun 26, 1991.  Alcohol Use - no Regular Exercise - yes Drug Use - no  Review of Systems        All systems reviewed and negative except as per HPI.   Current Outpatient Prescriptions  Medication Sig Dispense Refill  . amLODipine-benazepril (LOTREL) 5-40 MG per capsule Take 1 capsule by mouth daily.        . Ascorbic Acid (VITAMIN C CR PO) Take by mouth.        Marland Kitchen aspirin 81 MG tablet Take 81 mg by mouth daily.        . lansoprazole (PREVACID) 15 MG capsule Take 15 mg by mouth daily.        Marland Kitchen levothyroxine (SYNTHROID, LEVOTHROID) 100 MCG tablet Take 100 mcg by mouth daily.        Marland Kitchen LORazepam (ATIVAN) 1 MG tablet Take 1/4 tablet at bedtime.       . nebivolol (BYSTOLIC) 5 MG tablet Take 1 tablet (  5 mg total) by mouth 2 (two) times daily.  180 tablet  3  . NIFEdipine (PROCARDIA-XL) 60 MG (OSM) 24 hr tablet Take 60 mg by mouth 2 (two) times daily.        Marland Kitchen spironolactone (ALDACTONE) 25 MG tablet Take 25 mg by mouth 2 (two) times daily.        . cloNIDine (CATAPRES) 0.1 MG tablet Take 0.1 mg by mouth 1 dose over 46 hours.          BP 122/70  Pulse 64  Ht 5\' 5"  (1.651 m)  Wt 134 lb 6.4 oz (60.963 kg)  BMI 22.37 kg/m2 General:  Well developed, well nourished, in no acute distress. Neck:  Neck supple, no JVD. No masses, thyromegaly or abnormal cervical nodes. Lungs:  Clear bilaterally to auscultation and percussion. Heart:   Non-displaced PMI, chest non-tender; regular rate and rhythm, S1, S2 without murmur. +S4. Carotid upstroke normal, no bruit.  Pedals normal pulses. No edema, no varicosities. Abdomen:  Bowel sounds positive; abdomen soft and non-tender without masses, organomegaly, or hernias noted. No hepatosplenomegaly. Extremities:  No clubbing or cyanosis. Neurologic:  Alert and oriented x 3. Psych:  Normal affect.

## 2010-07-15 ENCOUNTER — Telehealth: Payer: Self-pay

## 2010-07-15 MED ORDER — AMLODIPINE BESY-BENAZEPRIL HCL 5-40 MG PO CAPS: 1.0000 | ORAL_CAPSULE | Freq: Every day | ORAL | Status: DC

## 2010-07-15 NOTE — Telephone Encounter (Signed)
Would like a 90 day supply with 3 refills sent to [facility] for Lotrel.

## 2010-07-16 ENCOUNTER — Telehealth: Payer: Self-pay | Admitting: Emergency Medicine

## 2010-07-16 NOTE — Telephone Encounter (Signed)
Pharmacy faxed a denial letter for pt's Bystolic. Letter states that we can appeal the denial of a medication with a letter. Do you want to do the appeal or try another medication?

## 2010-07-16 NOTE — Telephone Encounter (Signed)
Appeal.  Letter should state that she has tried multiple other beta blockers and was intolerant to all.  She has resistant HTN and needs the medication.

## 2010-07-17 ENCOUNTER — Encounter: Payer: Self-pay | Admitting: Emergency Medicine

## 2010-07-17 NOTE — Telephone Encounter (Signed)
Appeal letter has been faxed to [facility], fax # [phone], phone # [phone].

## 2010-07-23 ENCOUNTER — Other Ambulatory Visit (INDEPENDENT_AMBULATORY_CARE_PROVIDER_SITE_OTHER): Payer: Medicare PPO | Admitting: *Deleted

## 2010-07-23 DIAGNOSIS — E875 Hyperkalemia: Secondary | ICD-10-CM

## 2010-07-24 ENCOUNTER — Encounter: Payer: Self-pay | Admitting: Cardiology

## 2010-07-24 LAB — BASIC METABOLIC PANEL
Calcium: 9.3 mg/dL (ref 8.4–10.5)
Sodium: 134 mEq/L — ABNORMAL LOW (ref 135–145)

## 2010-07-29 ENCOUNTER — Telehealth: Payer: Self-pay | Admitting: Emergency Medicine

## 2010-07-29 NOTE — Telephone Encounter (Signed)
Pt notified that Bystolic is approved by insurance. Will scan letter in system

## 2010-08-05 ENCOUNTER — Encounter: Payer: Self-pay | Admitting: Family Medicine

## 2010-08-05 ENCOUNTER — Telehealth: Payer: Self-pay | Admitting: Cardiology

## 2010-08-05 NOTE — Telephone Encounter (Signed)
Per pt pt has a rash that she is not sure where it is coming from. Not sleeping well even with benadryl

## 2010-08-05 NOTE — Telephone Encounter (Signed)
Called patient back. She is complaining of an itchy rash for several weeks. Advised her to see dermatology. She actually had an appointment with dermatology in [place] tomorrow that she cancelled. She will call them back to see if she can still see that provider.

## 2010-08-06 ENCOUNTER — Ambulatory Visit (INDEPENDENT_AMBULATORY_CARE_PROVIDER_SITE_OTHER): Payer: Medicare PPO | Admitting: Family Medicine

## 2010-08-06 ENCOUNTER — Encounter: Payer: Self-pay | Admitting: Family Medicine

## 2010-08-06 DIAGNOSIS — H5440 Blindness, one eye, unspecified eye: Secondary | ICD-10-CM

## 2010-08-06 DIAGNOSIS — I10 Essential (primary) hypertension: Secondary | ICD-10-CM

## 2010-08-06 DIAGNOSIS — R21 Rash and other nonspecific skin eruption: Secondary | ICD-10-CM

## 2010-08-06 NOTE — Patient Instructions (Signed)
Return to see [place] in 3-6 months. Keep appointment with dermatologist and with rheumatologist. We will await records from them. Good to meet you today, call us with questions.

## 2010-08-06 NOTE — Assessment & Plan Note (Addendum)
In setting of complicated h/o autoimmune conditions. Has appt with derm and rheum this week.  Await their evaluation of patient and recommendations.  To see derm this afternoon. Main concern is pruritis, has upcoming trip next week. rec keep appt with them.

## 2010-08-06 NOTE — Progress Notes (Signed)
Subjective:    [name], female    DOB: 1941-01-17, 70 y.o.   MRN: [id number]  HPI CC: rash  Has had several different kinds of rashes over years.  Itching started worsening with recent changes in BP doses, no meds (and mainly decreases in doses.)  nto on hydralazine. 1. Erythematous spots on extremities, with photosensitivity. 2. Fine papular rash on chest, extremely pruritic.  Warm water in shower causes pain to this rash. 3. Oral lesions - develop into ulcers.  Chronic one right lower inner lip. 4. Spots on scalp, itchy. 5. Slight erythema bilateral cheeks.  No changes in lotions, detergents, soaps.  16 yrs ago dx with lupus.  Never had sxs as far as SLE.  Also told has RA.  No joint issues or myalgias currently.  No weight changes.  No fevers, chest pain/tightness, SOB. H/o occlusion in L eye with residual blindness (central retinal vein occlusion).   H/o lung cancer and uterine cancer.  Dr. Aundra Dubin (cards) takes care of blood pressure.  Now that it's better controlled, wonders if we can start following (primary care).  Advised to ask Dr. Aundra Dubin, if ok with him ok with [place]. Dr. Estanislado Pandy - scheduled to see her on Friday.  Getting miserable during waiting time so made appt today for sooner evaluation.  Also has made appt with dermatology for this afternoon.  Wonders if should keep it.  Past Medical History  Diagnosis Date  . Fatigue   . HTN (hypertension)     difficult to control, hypertensive urgency hospitalizations, normal urine/plasma metanephrines and catecholamines, renal dopplers without stenosis, nodule at lung base with followup recommended  . Hyperaldosteronism     serum aldo/renin ration elevated (aldo 23.7, renin <0.15), CT abd 2010 with normal adrenals, dedicated adrenal CT without adrenal adenoma,   . Hypothyroid   . COPD (chronic obstructive pulmonary disease)     quit smoking 1993  . Lung cancer     s/p L lobectomy 2001  . Central retinal vein  occlusion of left eye     legally blind in Left eye  . Dysfunction of eustachian tube   . Irritable bowel syndrome   . HLD (hyperlipidemia)   . Urge incontinence   . Goiter, unspecified   . Diverticulosis   . Pulmonary hypertension     echo [facility] 12/2008 with EF >5%, grade II diastolic dysfunction, RSVP >1mmHg, ACE level normal.  f/u echo at [facility] - EF 55-60%, mild MR, mild LAE, PASP 25 + RA (no pulm HTN)  . Collagen vascular disease     uncharacterized.  has seen rheum.  positive RF, positive serologies for Sjogrens.  C-ANCA/P-ANCA neg, ANA  neg, anti-dsDNA neg, anti-SCL70 neg, anti-centromere Ab neg  . Insomnia     Related to HTN and vision loss  . Anxiety     Related to HTN and vision loss  . GERD (gastroesophageal reflux disease)   . Uterine cancer     s/p hysterectomy 2001   Past Surgical History  Procedure Date  . Lobectomy 04/1999    left lung  . Abdominal hysterectomy 06/1999  . Cholecystectomy 1982  . Tubal ligation 1975  . Appendectomy 1958  . Ct chest 2007    enlarged thyroid with goiter  . Ct abd w & pelvis wo cm 2010    neg except diverticulosis    reports that she quit smoking about 19 years ago. Her smoking use included Cigarettes. She has never used smokeless tobacco. She  reports that she does not drink alcohol or use illicit drugs. family history includes Alcohol abuse in her father; Hypertension in her mother; and Pneumonia in her father. Allergies  Allergen Reactions  . Amlodipine Besylate     REACTION: sores in mouth  . Valsartan     REACTION: sores in mouth   Medications and allergies reviewed and updated in chart. Review of Systems Per HPI    Objective:   Physical Exam  Constitutional: She appears well-developed and well-nourished. No distress.  HENT:  Mouth/Throat: Mucous membranes are normal. No oropharyngeal exudate, posterior oropharyngeal edema or posterior oropharyngeal erythema.       Right lower lip with faint healing ulcers  Eyes:  EOM are normal. Pupils are equal, round, and reactive to light. No scleral icterus.       Slightly injected conjunctiva  Neck: Normal range of motion. Neck supple. No thyromegaly present.  Cardiovascular: Normal rate, regular rhythm, normal heart sounds and intact distal pulses.   No murmur heard. Pulmonary/Chest: Effort normal and breath sounds normal. No respiratory distress. She has no wheezes. She has no rales.  Abdominal: Soft. Bowel sounds are normal. She exhibits no distension. There is no tenderness. There is no rebound.  Lymphadenopathy:    She has no cervical adenopathy.  Skin: Skin is warm and dry.             Assessment & Plan:

## 2010-08-06 NOTE — Assessment & Plan Note (Signed)
followed by Dr. Aundra Dubin.  Asked pt to check with him, if ok we can continue to follow here. Today stable.  lastest change was decreasing bystolic by 1/2 dose, seems to be tolerating well.

## 2010-08-09 ENCOUNTER — Encounter: Payer: Self-pay | Admitting: Cardiology

## 2010-08-13 ENCOUNTER — Other Ambulatory Visit: Payer: Self-pay | Admitting: *Deleted

## 2010-08-13 MED ORDER — LANSOPRAZOLE 15 MG PO CPDR: 15.0000 mg | DELAYED_RELEASE_CAPSULE | Freq: Every day | ORAL | Status: DC

## 2010-08-23 ENCOUNTER — Other Ambulatory Visit: Payer: Self-pay | Admitting: *Deleted

## 2010-08-23 MED ORDER — LEVOTHYROXINE SODIUM 100 MCG PO TABS: 100.0000 ug | ORAL_TABLET | Freq: Every day | ORAL | Status: DC

## 2010-08-30 ENCOUNTER — Telehealth: Payer: Self-pay | Admitting: *Deleted

## 2010-08-30 NOTE — Telephone Encounter (Signed)
She can take methotrexate but needs to be renally dosed.  There is some risk of kidney damage with MTX but lower risk if low dose is used.

## 2010-08-30 NOTE — Telephone Encounter (Signed)
Pt called stating she received labs back from Dr. Humberto Seals that shows she needs medication for lupus. She stated that one option is Methotrexate, but was told it may affect kidneys. Pt wants to know if you think this is safe for her to take. I know recent labs showed creat trending up, in May creat 1.56. Please advise.

## 2010-09-02 ENCOUNTER — Encounter: Payer: Self-pay | Admitting: Family Medicine

## 2010-09-02 ENCOUNTER — Ambulatory Visit (INDEPENDENT_AMBULATORY_CARE_PROVIDER_SITE_OTHER): Payer: Medicare PPO | Admitting: Family Medicine

## 2010-09-02 VITALS — BP 98/50 | HR 68 | Temp 98.3°F | Wt 130.0 lb

## 2010-09-02 DIAGNOSIS — I10 Essential (primary) hypertension: Secondary | ICD-10-CM

## 2010-09-02 DIAGNOSIS — Z23 Encounter for immunization: Secondary | ICD-10-CM

## 2010-09-02 DIAGNOSIS — M329 Systemic lupus erythematosus, unspecified: Secondary | ICD-10-CM | POA: Insufficient documentation

## 2010-09-02 DIAGNOSIS — IMO0002 Reserved for concepts with insufficient information to code with codable children: Secondary | ICD-10-CM

## 2010-09-02 NOTE — Patient Instructions (Signed)
I'll call Aundra Dubin and [employer] and then send word.  Take care.

## 2010-09-02 NOTE — Assessment & Plan Note (Signed)
>  25 min spent with face to face with patient >50% counseling.  No change in meds.  Labs d/w pt and I'll be in touch with cards about fu for HTN.

## 2010-09-02 NOTE — Progress Notes (Signed)
Several issues to consider.  Hypertension:    Using medication without problems or lightheadedness: yes- doesn't have orthostatic sx Chest pain with exertion:no Edema:minimal  Short of breath:no Average home BPs: ~110-120/50s Asking if we can follow BP here, transition from cards.  Lupus- per rheum.  On steroids with plan to taper and then transition.  Asking about PNA vaccine and shingles vaccine.  I will be in touch with Dr. Estanislado Pandy.  She feels alternately jittery then fatigued, hungry but then w/o appetite on the prednisone.  D/w pt today.  I think this will resolve during the taper.   Renal disease- related to HTN and/or lupus?  Cr was trending up but then decreased to 1.22 last labs. D/w pt.  See plan.   Meds, vitals, and allergies reviewed.   PMH and SH reviewed  ROS: See HPI.  Otherwise negative.    GEN: nad, alert and oriented HEENT: mucous membranes moist NECK: supple w/o LA CV: rrr. PULM: ctab, no inc wob ABD: soft, +bs EXT: trace edema SKIN: no acute rash

## 2010-09-02 NOTE — Assessment & Plan Note (Signed)
Per rheum.  I'll be in touch with Dr. Estanislado Pandy and then notify pt.  Proceed with PNA vaccine today.  Labs d/w pt.

## 2010-09-03 NOTE — Telephone Encounter (Signed)
Notified pt of msg below. She states that Dr. Humberto Seals has decided to start her on Plaquenil instead of Methotrexate.

## 2010-09-05 ENCOUNTER — Telehealth: Payer: Self-pay | Admitting: Family Medicine

## 2010-09-05 DIAGNOSIS — R7989 Other specified abnormal findings of blood chemistry: Secondary | ICD-10-CM

## 2010-09-05 NOTE — Telephone Encounter (Signed)
I called Dr. Estanislado Pandy.  She is not going to use plaquenil due to vision concerns.  She may transition to MTX.  It would be reasonable to get renal consult given the possible MTX use, it would be okay to get the shingles vaccine and the prevacid/probitic shouldn't be a problem either.  She prev had EKG done, which is useful since she was rho antibody positive.  This increases the risk on arrhythmia.    I sent a note to Dr. Aundra Dubin and will await response. I'll call pt after that.

## 2010-09-05 NOTE — Telephone Encounter (Signed)
I called pt.  Per Dr. Kirk Ruths, it would be okay for patient to follow here for BP.  She has had some orthostatic sx with DBP in the 40s.  SBP 110-120.  She'll cut back to 1 tab on the procardia (60mg  a day) and let me know about her BP next week.    Also- correction- plan is for low dose plaquenil, not MTX.  Pt has the rx for plaquenil.    I gave her the other information below and she would like to follow through with renal consult.  Will refer for this.    Pt is aware that call will be coming through in the future about the renal consult.  Sent to [place] as a FYI about the referral .

## 2010-09-06 NOTE — Telephone Encounter (Signed)
Referral to Nephrology faxed to [facility]. Waiting for appt to be made.

## 2010-09-11 ENCOUNTER — Telehealth: Payer: Self-pay | Admitting: *Deleted

## 2010-09-11 NOTE — Telephone Encounter (Signed)
Patient coming in tomorrow morning at 9:45 for nurse visit.  Could not get it scheduled in EPIC.  She stated that she will make another appt to see renal doctor.

## 2010-09-11 NOTE — Telephone Encounter (Signed)
Please arrange for RN visit for zostavax.  Please ask pt about the renal referral.  It was reportedly scheduled but then cancelled.  Thanks.

## 2010-09-11 NOTE — Telephone Encounter (Signed)
Noted  

## 2010-09-11 NOTE — Telephone Encounter (Signed)
Pt would like to get zostavax, she has checked with her insurance company and they will pay for it.

## 2010-09-12 ENCOUNTER — Ambulatory Visit (INDEPENDENT_AMBULATORY_CARE_PROVIDER_SITE_OTHER): Payer: Medicare PPO | Admitting: Family Medicine

## 2010-09-12 DIAGNOSIS — Z23 Encounter for immunization: Secondary | ICD-10-CM

## 2010-09-12 NOTE — Progress Notes (Signed)
Zostavax vaccine given during nurse visit today.

## 2010-09-12 NOTE — Progress Notes (Signed)
  Subjective:    [name], female    DOB: 07/16/1940, 70 y.o.   MRN: [id number]  HPI    Review of Systems     Objective:   Physical Exam        Assessment & Plan:  rn visit.

## 2010-09-24 ENCOUNTER — Ambulatory Visit: Payer: Medicare PPO

## 2010-10-09 ENCOUNTER — Encounter: Payer: Self-pay | Admitting: Family Medicine

## 2010-10-09 DIAGNOSIS — N183 Chronic kidney disease, stage 3 unspecified: Secondary | ICD-10-CM | POA: Insufficient documentation

## 2010-10-17 ENCOUNTER — Other Ambulatory Visit: Payer: Self-pay | Admitting: *Deleted

## 2010-10-17 MED ORDER — AMLODIPINE BESY-BENAZEPRIL HCL 5-40 MG PO CAPS: 1.0000 | ORAL_CAPSULE | Freq: Every day | ORAL | Status: DC

## 2010-10-23 ENCOUNTER — Other Ambulatory Visit: Payer: Self-pay | Admitting: *Deleted

## 2010-10-23 ENCOUNTER — Ambulatory Visit: Payer: Self-pay | Admitting: Nephrology

## 2010-10-23 MED ORDER — AMLODIPINE BESY-BENAZEPRIL HCL 5-40 MG PO CAPS: 1.0000 | ORAL_CAPSULE | Freq: Every day | ORAL | Status: DC

## 2010-10-23 NOTE — Telephone Encounter (Signed)
Pt states generic was sent in last time and she hasnt taken generic in years.  She states she cancelled that script, requests that we send in new script for brand only.

## 2010-10-24 ENCOUNTER — Telehealth: Payer: Self-pay | Admitting: *Deleted

## 2010-10-24 NOTE — Telephone Encounter (Signed)
I'll address the hard copy.  

## 2010-10-24 NOTE — Telephone Encounter (Signed)
Script for brand name lotrel was sent to [facility] electronically yesterday.  They have faxed a form for you to sign regarding this, form is on your desk.

## 2010-10-26 ENCOUNTER — Other Ambulatory Visit: Payer: Self-pay | Admitting: Nephrology

## 2010-10-29 ENCOUNTER — Observation Stay: Payer: Self-pay | Admitting: Nephrology

## 2010-10-31 ENCOUNTER — Encounter: Payer: Self-pay | Admitting: Family Medicine

## 2010-11-06 ENCOUNTER — Telehealth: Payer: Self-pay

## 2010-11-06 NOTE — Telephone Encounter (Signed)
Patient would like a refill on lorazepam and it looks as if you did write the original Rx.  Please advise if okay to refill?

## 2010-11-06 NOTE — Telephone Encounter (Signed)
That is fine 

## 2010-11-07 ENCOUNTER — Telehealth: Payer: Self-pay

## 2010-11-07 MED ORDER — LORAZEPAM 1 MG PO TABS: 0.5000 mg | ORAL_TABLET | Freq: Every day | ORAL | Status: DC

## 2010-11-07 NOTE — Telephone Encounter (Signed)
Refill called in for lorazepam.

## 2010-11-07 NOTE — Telephone Encounter (Signed)
Requested a refill for lorazepam. Spoke with Suezanne Jacquet with [facility] okay to refill the lorazepam 1 mg take 1/2 tablet at bedtime, #45 with 3 refills.

## 2010-11-07 NOTE — Telephone Encounter (Signed)
See below

## 2010-11-19 ENCOUNTER — Ambulatory Visit: Payer: Self-pay | Admitting: Rheumatology

## 2010-12-19 ENCOUNTER — Other Ambulatory Visit: Payer: Self-pay

## 2010-12-19 MED ORDER — SPIRONOLACTONE 25 MG PO TABS: 25.0000 mg | ORAL_TABLET | Freq: Two times a day (BID) | ORAL | Status: DC

## 2010-12-19 NOTE — Telephone Encounter (Signed)
Medication sent to [facility] as instructed. Patient's husband notified as instructed by telephone that med was sent to pharmacy.

## 2010-12-19 NOTE — Telephone Encounter (Signed)
Yes, okay to continue.  Thanks.  Rx sent.

## 2010-12-19 NOTE — Telephone Encounter (Signed)
Pt called requested Spironolactone 25 mg taking one tab bid. Pt request 90 day supply with yr refill sent to [facility]. Pt seen by Dr Damita Dunnings 09/02/10 and pt has Lupus and renal disease. Wanted to verify OK to fill med ?

## 2011-04-10 ENCOUNTER — Other Ambulatory Visit: Payer: Self-pay

## 2011-04-10 NOTE — Telephone Encounter (Signed)
Pt spoke with [facility] about refill Bystolic 5 mg taking 1 tab twice a day. Pt was told needed prior authorization. Was given # [id number] to get prior authorization for Bystolic. Pt request our office to call because in the past pt said it took a long time for [facility] to get in touch with our office. Pt also wanted to know if she needed to make f/u appt with Dr Damita Dunnings because she will soon need other meds refilled. Pt can be reached at [phone].

## 2011-04-15 NOTE — Telephone Encounter (Signed)
I have made a few attempts to call for this PA last week but was not able to hold for the amount of time required.  I phoned for the PA today.  The medication is covered currently but is only covered for once a day.  This medication is prescribed to the patient twice daily.   A prior auth form is being faxed with the reference # [id number].

## 2011-04-15 NOTE — Telephone Encounter (Signed)
Pt called and wanted to know status of prior authorization and also to see if pt needed to come in for f/u appt. Pt will wait to hear from [place], Dr Josefine Class nurse. Pt can be reached at [phone].

## 2011-04-15 NOTE — Telephone Encounter (Signed)
Form is in your in box

## 2011-04-15 NOTE — Telephone Encounter (Signed)
I'll address the hard copy.  Please schedule a 25min OV to update her ongoing conditions.  Thanks.

## 2011-04-16 NOTE — Telephone Encounter (Signed)
Completed form faxed back to [facility]. Patient notified as instructed by telephone. Patient stated that she will call back and schedule a 30 minute appointment.

## 2011-04-22 ENCOUNTER — Other Ambulatory Visit: Payer: Self-pay | Admitting: Family Medicine

## 2011-04-22 NOTE — Telephone Encounter (Signed)
Refill request for generic Procardia.  Patient has 10-12 tabs left.  Call back please

## 2011-04-22 NOTE — Telephone Encounter (Signed)
Is it okay to send this RF or does Cards do this?

## 2011-04-23 MED ORDER — NIFEDIPINE ER OSMOTIC RELEASE 60 MG PO TB24: 60.0000 mg | ORAL_TABLET | Freq: Every day | ORAL | Status: DC

## 2011-05-05 ENCOUNTER — Other Ambulatory Visit: Payer: Self-pay | Admitting: *Deleted

## 2011-05-05 ENCOUNTER — Telehealth: Payer: Self-pay | Admitting: *Deleted

## 2011-05-05 MED ORDER — NEBIVOLOL HCL 5 MG PO TABS: 5.0000 mg | ORAL_TABLET | Freq: Two times a day (BID) | ORAL | Status: DC

## 2011-05-05 NOTE — Telephone Encounter (Signed)
Rx sent to [facility] and #10 to [facility] [place].

## 2011-05-05 NOTE — Telephone Encounter (Signed)
Patient called stating that she received a call from her mail order pharmacy Saturday night telling her that the approval on her Bystolic did not pass the physician's review. Patients states that when she first talked with them they told her that it was in the mail , then they told her that it was with a pharmacist and now she is being told that they can not send it to her. Patient states that she is down to her last 4 pills and is leaving to go out of town Friday. Patient states that she hates to bother you, but she does not know what to do about this at this point without you getting involved and telling them that she has to have this medication.

## 2011-05-05 NOTE — Telephone Encounter (Signed)
Left message with patient's husband to call back.

## 2011-05-05 NOTE — Telephone Encounter (Signed)
[place], [place] [address] p. [phone] f. [phone] To: [facility] (Daytime Triage) Fax: [phone] From: [facility] Date/ Time: 05/05/2011 9:32 AM Taken By: Niger Trimble, CSR Caller: [place]: not collected Patient: Margaret Black DOB: Aug 05, 1940 Phone: [phone] Reason for Call: Pt is returning a call from office about medication issues, Bystolic 5mg  BID, needs script faxed in for 90 days to pharmacy, [place], [phone] (pharmacy phone number) [phone] (pharmacy fax number) pt would like to speak to someone concerning this script as well Regarding Appointment: Appt Date: Appt Time: Unknown Provider: Reason: Details: Outcome:

## 2011-05-05 NOTE — Telephone Encounter (Signed)
Patient states that she just found out today that the reason that they are denying the Bystolic is because the form that was completed and sent to them from this office shows a 30 day supply instead of a 90 day supply. Patient needs a new script sent to [facility] for a 90 day supply. Patient also needs #[facility] pills [facility] to last her until she gets back from her trip and can get her medication from her mail order pharmacy. The patient states that to make sure that only #10 is sent to the local pharmacy because that is all the insurance will pay for.

## 2011-05-27 ENCOUNTER — Ambulatory Visit (INDEPENDENT_AMBULATORY_CARE_PROVIDER_SITE_OTHER): Payer: Medicare PPO | Admitting: Family Medicine

## 2011-05-27 ENCOUNTER — Encounter: Payer: Self-pay | Admitting: Family Medicine

## 2011-05-27 DIAGNOSIS — M329 Systemic lupus erythematosus, unspecified: Secondary | ICD-10-CM

## 2011-05-27 DIAGNOSIS — N183 Chronic kidney disease, stage 3 unspecified: Secondary | ICD-10-CM

## 2011-05-27 DIAGNOSIS — H5440 Blindness, one eye, unspecified eye: Secondary | ICD-10-CM

## 2011-05-27 NOTE — Progress Notes (Signed)
HTN.  She's tolerating amlodipine and this was discussed.  It may have been lupus prev that caused the sores in mouth, not BP med.  Reclassified as nonallergy.  Minimal edema. BP controlled out of clinic.   Lupus vs sjogren's.  She is followed by Rheum and is on MTX.  She still has some occ skin changes.  No FCNAVD.  I have called Dr. Holley Raring and I'm awaiting call back from Dr. Estanislado Pandy.    She had retinal vein occlusion and was followed by eye clinic.  No new changes in vision.   She is seeing Dr. Earlean Shawl re: colitis and diverticulitis.  Better with probiotic.    Meds, vitals, and allergies reviewed.   ROS: See HPI.  Otherwise, noncontributory.  nad ncat Mmm rrr ctab abd soft, not ttp Ext w/o edema.

## 2011-05-27 NOTE — Patient Instructions (Signed)
Please check to make sure your thyroid level is going to be checked.   I'll check with Dr. Holley Raring and Dr. Estanislado Pandy.   Take care.

## 2011-05-29 ENCOUNTER — Other Ambulatory Visit: Payer: Self-pay | Admitting: *Deleted

## 2011-05-29 ENCOUNTER — Encounter: Payer: Self-pay | Admitting: Family Medicine

## 2011-05-29 MED ORDER — LANSOPRAZOLE 15 MG PO CPDR: 15.0000 mg | DELAYED_RELEASE_CAPSULE | Freq: Every day | ORAL | Status: DC

## 2011-05-29 MED ORDER — AMLODIPINE BESY-BENAZEPRIL HCL 5-40 MG PO CAPS: 1.0000 | ORAL_CAPSULE | Freq: Every day | ORAL | Status: DC

## 2011-05-29 MED ORDER — NIFEDIPINE ER OSMOTIC RELEASE 60 MG PO TB24: 60.0000 mg | ORAL_TABLET | Freq: Every day | ORAL | Status: DC

## 2011-05-29 MED ORDER — SPIRONOLACTONE 25 MG PO TABS: 25.0000 mg | ORAL_TABLET | Freq: Two times a day (BID) | ORAL | Status: DC

## 2011-05-29 MED ORDER — NEBIVOLOL HCL 5 MG PO TABS: 5.0000 mg | ORAL_TABLET | Freq: Two times a day (BID) | ORAL | Status: DC

## 2011-05-29 MED ORDER — LORAZEPAM 1 MG PO TABS: 0.5000 mg | ORAL_TABLET | Freq: Every day | ORAL | Status: DC

## 2011-05-29 MED ORDER — FOLIC ACID 1 MG PO TABS: 1.0000 mg | ORAL_TABLET | Freq: Every day | ORAL | Status: DC

## 2011-05-29 MED ORDER — LEVOTHYROXINE SODIUM 100 MCG PO TABS: 100.0000 ug | ORAL_TABLET | Freq: Every day | ORAL | Status: DC

## 2011-05-29 NOTE — Assessment & Plan Note (Signed)
Dr. Estanislado Pandy is out of office, I've asked for call back and I'll communicate this to patient. >25 min spent with face to face with patient, >50% counseling and/or coordinating care.

## 2011-05-29 NOTE — Assessment & Plan Note (Signed)
Per eye clinic.

## 2011-05-29 NOTE — Assessment & Plan Note (Signed)
I talked with Dr. Holley Raring after the OV.  It is very reasonable to have patient continue see him.  Will continue on MTX for now as benefit very much likely outweighs the risk of the med.  Renal function stable on last set of labs.  I will d/w pt via phone.

## 2011-06-04 ENCOUNTER — Telehealth: Payer: Self-pay | Admitting: Family Medicine

## 2011-06-04 NOTE — Telephone Encounter (Signed)
I had called pt and LMOVM.  I wasn't able to get her but I got her husband.  I asked him to get her to call the clinic.  Please give her the following message:  I talked with Drs. Lateef and Deveshwar (she was out of the office last week so I didn't get to talk to her until Monday).  Both agree that it would be reasonable to continue the methotrexate for now, for the foreseeable future.  I think that continued input from both is reasonable and they agree.  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  They will try to coordinate her labs from now on.  Thanks.

## 2011-06-04 NOTE — Telephone Encounter (Signed)
Patient notified as instructed by telephone. Was informed by patient that she will make sure that the other doctors always send Dr. Damita Dunnings a copy of the lab work that they do on her. Was advised by patient that she greatly appreciates Dr. Josefine Class help with this.

## 2011-06-10 ENCOUNTER — Telehealth: Payer: Self-pay | Admitting: *Deleted

## 2011-06-10 MED ORDER — NEBIVOLOL HCL 5 MG PO TABS: 10.0000 mg | ORAL_TABLET | Freq: Every day | ORAL | Status: DC

## 2011-06-10 NOTE — Telephone Encounter (Signed)
Patient advised.

## 2011-06-10 NOTE — Telephone Encounter (Signed)
Okay to take 10mg  once a day.  Thanks.

## 2011-06-10 NOTE — Telephone Encounter (Signed)
Patient called stating that she is taking Bystolic 5mg  twice daily, she wants to know if she can just take 10mg  once daily instead of taking two pills?  Please advise.

## 2011-07-03 ENCOUNTER — Other Ambulatory Visit: Payer: Self-pay

## 2011-07-03 MED ORDER — LEVOTHYROXINE SODIUM 100 MCG PO TABS: 100.0000 ug | ORAL_TABLET | Freq: Every day | ORAL | Status: DC

## 2011-07-03 MED ORDER — NIFEDIPINE ER OSMOTIC RELEASE 60 MG PO TB24: 60.0000 mg | ORAL_TABLET | Freq: Every day | ORAL | Status: DC

## 2011-07-03 MED ORDER — LANSOPRAZOLE 15 MG PO CPDR: 15.0000 mg | DELAYED_RELEASE_CAPSULE | Freq: Every day | ORAL | Status: DC

## 2011-07-03 MED ORDER — NEBIVOLOL HCL 10 MG PO TABS: 10.0000 mg | ORAL_TABLET | Freq: Every day | ORAL | Status: DC

## 2011-07-03 MED ORDER — FOLIC ACID 1 MG PO TABS: 1.0000 mg | ORAL_TABLET | Freq: Every day | ORAL | Status: DC

## 2011-07-03 MED ORDER — LORAZEPAM 1 MG PO TABS: 0.5000 mg | ORAL_TABLET | Freq: Every day | ORAL | Status: DC

## 2011-07-03 MED ORDER — SPIRONOLACTONE 25 MG PO TABS: 25.0000 mg | ORAL_TABLET | Freq: Two times a day (BID) | ORAL | Status: DC

## 2011-07-03 MED ORDER — AMLODIPINE BESY-BENAZEPRIL HCL 5-40 MG PO CAPS: 1.0000 | ORAL_CAPSULE | Freq: Every day | ORAL | Status: DC

## 2011-07-03 NOTE — Telephone Encounter (Signed)
Please send in bystolic 10mg  a day, #90, 3rf.  Okay for Lorazepam 1 mg (0.5 tab po qhs prn) #45 x 1 to be sent to [facility].  That will need to be faxed.  I printed and signed for that.

## 2011-07-03 NOTE — Telephone Encounter (Signed)
Pt request Amlodipine-benazepril Q000111Q mg, folic acid 1 mg, lansoprazole 15 mg, Levothyroxine 100 mcg and Nifedipine 24 hr tab 60 mg refilled #90 x 3 be sent to [facility] instead of [facility] due to cost. Also  Spironolactone 25 mg #180 x 3 be sent to [facility] also. Meds sent and pt notified. Pt also request Bystolic 10 mg be sent to [facility]. On med list Bystolic 10 mg taking 1 tab twice a day but 06/10/11 phone note said Bystolic 10 mg taking 1 daily. Pt confirmed she has been taking Bystolic 10 mg daily. Please clarify. Also pt request Lorazepam 1 mg #45 x 1 to be sent to [facility]. Is it OK to send to mail order or does this rx need to be filled at [facility]?Please advise. Pt can be reached at [phone] if Lorazepam needs to be filled at local pharmacy.

## 2011-07-03 NOTE — Telephone Encounter (Signed)
Bystolic sent to [facility] as advised by [facility].  Printed Rx for Lorazepam faxed to [facility].

## 2011-07-29 ENCOUNTER — Other Ambulatory Visit: Payer: Self-pay

## 2011-07-29 MED ORDER — LEVOTHYROXINE SODIUM 100 MCG PO TABS: 100.0000 ug | ORAL_TABLET | Freq: Every day | ORAL | Status: DC

## 2011-07-29 NOTE — Telephone Encounter (Signed)
Pt said has been taking Synthroid 100 mcg name brand; was sent in as generic and pt request resent namebrand. Verified with [place]. Synthroid 100 mcg # 30 x 0 sent to [facility] and Synthroid 100 mcg # 90 x 3 sent to [facility]. Pt notified while on phone.

## 2011-09-12 ENCOUNTER — Ambulatory Visit: Payer: Medicare PPO | Admitting: Family Medicine

## 2011-09-29 ENCOUNTER — Telehealth: Payer: Self-pay | Admitting: Family Medicine

## 2011-09-29 ENCOUNTER — Encounter (HOSPITAL_COMMUNITY): Payer: Self-pay | Admitting: *Deleted

## 2011-09-29 ENCOUNTER — Ambulatory Visit (INDEPENDENT_AMBULATORY_CARE_PROVIDER_SITE_OTHER): Payer: Medicare PPO | Admitting: Family Medicine

## 2011-09-29 ENCOUNTER — Encounter: Payer: Self-pay | Admitting: Family Medicine

## 2011-09-29 ENCOUNTER — Emergency Department (HOSPITAL_COMMUNITY)
Admission: EM | Admit: 2011-09-29 | Discharge: 2011-09-29 | Disposition: A | Discharge status: Home / Self Care | Payer: Medicare PPO | Attending: Emergency Medicine | Admitting: Emergency Medicine

## 2011-09-29 ENCOUNTER — Emergency Department (HOSPITAL_COMMUNITY): Payer: Medicare PPO

## 2011-09-29 ENCOUNTER — Telehealth: Payer: Self-pay | Admitting: *Deleted

## 2011-09-29 ENCOUNTER — Other Ambulatory Visit: Payer: Self-pay

## 2011-09-29 VITALS — BP 130/56 | HR 64 | Temp 97.5°F | Ht 65.0 in | Wt 131.2 lb

## 2011-09-29 DIAGNOSIS — R55 Syncope and collapse: Secondary | ICD-10-CM | POA: Insufficient documentation

## 2011-09-29 DIAGNOSIS — R5381 Other malaise: Secondary | ICD-10-CM | POA: Insufficient documentation

## 2011-09-29 DIAGNOSIS — E039 Hypothyroidism, unspecified: Secondary | ICD-10-CM | POA: Insufficient documentation

## 2011-09-29 DIAGNOSIS — N390 Urinary tract infection, site not specified: Secondary | ICD-10-CM

## 2011-09-29 DIAGNOSIS — Z85118 Personal history of other malignant neoplasm of bronchus and lung: Secondary | ICD-10-CM | POA: Insufficient documentation

## 2011-09-29 DIAGNOSIS — R5383 Other fatigue: Secondary | ICD-10-CM | POA: Insufficient documentation

## 2011-09-29 DIAGNOSIS — I10 Essential (primary) hypertension: Secondary | ICD-10-CM | POA: Insufficient documentation

## 2011-09-29 DIAGNOSIS — R42 Dizziness and giddiness: Secondary | ICD-10-CM | POA: Insufficient documentation

## 2011-09-29 LAB — COMPREHENSIVE METABOLIC PANEL
Alkaline Phosphatase: 88 U/L (ref 39–117)
BUN: 29 mg/dL — ABNORMAL HIGH (ref 6–23)
Chloride: 101 mEq/L (ref 96–112)
GFR calc Af Amer: 44 mL/min — ABNORMAL LOW (ref >90)
Glucose, Bld: 172 mg/dL — ABNORMAL HIGH (ref 70–99)
Potassium: 4.5 mEq/L (ref 3.5–5.1)
Total Bilirubin: 0.5 mg/dL (ref 0.3–1.2)

## 2011-09-29 LAB — URINE MICROSCOPIC-ADD ON

## 2011-09-29 LAB — URINALYSIS, ROUTINE W REFLEX MICROSCOPIC
Bilirubin Urine: NEGATIVE
Ketones, ur: NEGATIVE mg/dL
Nitrite: NEGATIVE
Urobilinogen, UA: 0.2 mg/dL (ref 0.0–1.0)

## 2011-09-29 LAB — CBC WITH DIFFERENTIAL/PLATELET
Hemoglobin: 11.8 g/dL — ABNORMAL LOW (ref 12.0–15.0)
Lymphs Abs: 1 10*3/uL (ref 0.7–4.0)
MCH: 33.1 pg (ref 26.0–34.0)
Monocytes Relative: 6 % (ref 3–12)
Neutro Abs: 5 10*3/uL (ref 1.7–7.7)
Neutrophils Relative %: 77 % (ref 43–77)
RBC: 3.57 MIL/uL — ABNORMAL LOW (ref 3.87–5.11)

## 2011-09-29 LAB — TROPONIN I: Troponin I: <0.3 ng/mL (ref <0.30)

## 2011-09-29 MED ORDER — CEPHALEXIN 500 MG PO CAPS: 500.0000 mg | ORAL_CAPSULE | Freq: Four times a day (QID) | ORAL | Status: DC

## 2011-09-29 MED ORDER — NITROFURANTOIN MONOHYD MACRO 100 MG PO CAPS: 100.0000 mg | ORAL_CAPSULE | Freq: Once | ORAL | Status: DC

## 2011-09-29 NOTE — ED Notes (Signed)
The patient is AOx4 and comfortable with her discharge instructions. 

## 2011-09-29 NOTE — Progress Notes (Signed)
Was playing bridge this AM and she wasn't able to play as she would have expected "because I felt like I had been put in slow motion".  No focal neuro changes- no speech changes, no motor changes.  She was told that she passed out by her friends, but she doesn't remember this.  "I felt like it was an out of body experience."  Her face was reportedly flushed.  She is back to baseline now.  No CP, not sob now or during the event.  This wasn't a positional event.    911 was called.  EMS eval in the field.  ER transport declined then called the clinic to be seen today.   She has a h/o difficult to control BP.  BP not elevated today. BP has been in 110/40s at home, occ higher, per patient.    EKG reviewed today, no sig change from 2010.    Past Medical History  Diagnosis Date  . Fatigue   . Hyperaldosteronism     serum aldo/renin ration elevated (aldo 23.7, renin <0.15), CT abd 2010 with normal adrenals, dedicated adrenal CT without adrenal adenoma,   . Hypothyroid   . COPD (chronic obstructive pulmonary disease)     quit smoking 1993  . Lung cancer     s/p L lobectomy 2001  . Central retinal vein occlusion of left eye     legally blind in Left eye  . Dysfunction of eustachian tube   . Irritable bowel syndrome   . HLD (hyperlipidemia)   . Urge incontinence   . Goiter, unspecified   . Diverticulosis   . Pulmonary hypertension     echo [facility] 12/2008 with EF >5%, grade II diastolic dysfunction, RSVP >33mmHg, ACE level normal.  f/u echo at [place] - EF 55-60%, mild MR, mild LAE, PASP 25 + RA (no pulm HTN)  . Collagen vascular disease     uncharacterized.  has seen rheum.  positive RF, positive serologies for Sjogrens.  C-ANCA/P-ANCA neg, ANA  neg, anti-dsDNA neg, anti-SCL70 neg, anti-centromere Ab neg  . Insomnia     Related to HTN and vision loss  . Anxiety     Related to HTN and vision loss  . GERD (gastroesophageal reflux disease)   . Uterine cancer     s/p hysterectomy 2001  .  Osteopenia   . Lupus   . HTN (hypertension)     difficult to control, hypertensive urgency hospitalizations, normal urine/plasma metanephrines and catecholamines, renal dopplers without stenosis  . Chronic kidney disease     with allergic interstitial nephritis on biopsy 2012    Past Surgical History  Procedure Date  . Lobectomy 04/1999    left lung  . Abdominal hysterectomy 06/1999  . Cholecystectomy 1982  . Tubal ligation 1975  . Appendectomy 1958  . Ct chest 2007    enlarged thyroid with goiter  . Ct abd w & pelvis wo cm 2010    neg except diverticulosis   Current Outpatient Prescriptions on File Prior to Visit  Medication Sig Dispense Refill  . amLODipine-benazepril (LOTREL) 5-40 MG per capsule Take 1 capsule by mouth daily.  90 capsule  3  . Ascorbic Acid (VITAMIN C CR PO) Take by mouth.        Marland Kitchen aspirin 81 MG tablet Take 81 mg by mouth daily.        . folic acid (FOLVITE) 1 MG tablet Take 1 tablet (1 mg total) by mouth daily.  90 tablet  3  .  lansoprazole (PREVACID) 15 MG capsule Take 1 capsule (15 mg total) by mouth daily.  90 capsule  3  . levothyroxine (SYNTHROID, LEVOTHROID) 100 MCG tablet Take 1 tablet (100 mcg total) by mouth daily.  90 tablet  3  . LORazepam (ATIVAN) 1 MG tablet Take 0.5 tablets (0.5 mg total) by mouth at bedtime.  45 tablet  1  . methotrexate 1 G injection Inject 4 mg into the vein once. Weekly      . Multiple Vitamin (MULTIVITAMIN) tablet Take 1 tablet by mouth daily.      . nebivolol (BYSTOLIC) 10 MG tablet Take 1 tablet (10 mg total) by mouth daily.  90 tablet  3  . NIFEdipine (PROCARDIA XL) 60 MG 24 hr tablet Take 1 tablet (60 mg total) by mouth daily.  90 tablet  3  . spironolactone (ALDACTONE) 25 MG tablet Take 1 tablet (25 mg total) by mouth 2 (two) times daily.  180 tablet  3   Allergies  Allergen Reactions  . Valsartan     REACTION: sores in mouth   Meds, vitals, and allergies reviewed.   ROS: See HPI.  Otherwise,  noncontributory.  GEN: nad, alert and oriented HEENT: mucous membranes moist NECK: supple w/o LA CV: rrr PULM: ctab, no inc wob ABD: soft, +bs EXT: no edema CN 2-12 wnl B, S/S/DTR wnl x

## 2011-09-29 NOTE — ED Notes (Signed)
Called pharmacy to request for the patient's medicine to be sent to pod a.

## 2011-09-29 NOTE — Assessment & Plan Note (Signed)
Likely syncope, she can't recall the full event.  I would presume that she did pass out and treat her as such.  Feeling well now.  Has a driver.  I advised ER eval.  She agrees.  EKG sent with patient.

## 2011-09-29 NOTE — Telephone Encounter (Signed)
Spoke with Janett Billow in Triage at [facility] ER and notified that the patient was advised to go to any ER of her choice and that she said she would go to [facility] ER.  Briefly summarized that patient had been advised earlier to go to [facility] emergency room and patient went to their ER and left without being checked in and returned a call to our office where she was advised again to go to an emergency room for her symptoms.

## 2011-09-29 NOTE — Telephone Encounter (Signed)
Mandy from [facility] left a message that patient is light headed and dizzy and needs to be seen today.  Left message for Leafy Ro to call back. Per Dr. Damita Dunnings he needs to know if the patient has she passed out, does she feel like she is going to pass out, any fever, etc.? If she needs to be seen today, may need to go to the ER per Dr. Damita Dunnings.

## 2011-09-29 NOTE — ED Notes (Signed)
The had dizziness earlier today that lasted approx one hour her bp was high.  She was sent here from dr lebauers office to be monitored.  At  Present no dizziness no pain

## 2011-09-29 NOTE — Patient Instructions (Addendum)
Go to [facility] ER.  We'll call ahead.

## 2011-09-29 NOTE — ED Provider Notes (Signed)
History     CSN: [id number]  Arrival date & time 09/29/11  1628   First MD Initiated Contact with Patient 09/29/11 1828      Chief Complaint  Patient presents with  . Dizziness    (Consider location/radiation/quality/duration/timing/severity/associated sxs/prior treatment) HPI Pt presents with c/o generalized fatigue over the past 2 weeks which has been worsening.  Today she describes being seated playing bridge with her friends and she felt more tired.  She specifically denies dizziness or lightheadedness.  She states she nearly fainted.  There was no LOC, no weakness of extremities, no changes in speech or vision.  No vertigo.  She states that she gradually returned to her baseline.  She was seen by her PMD and advised to come to the ED for further workup.  She does state that during this episode her BP was taken by someone at the bridge game and it was elevated.  There are no other associated systemic symptoms, there are no other alleviating or modifying factors.    Past Medical History  Diagnosis Date  . Fatigue   . Hyperaldosteronism     serum aldo/renin ration elevated (aldo 23.7, renin <0.15), CT abd 2010 with normal adrenals, dedicated adrenal CT without adrenal adenoma,   . Hypothyroid   . COPD (chronic obstructive pulmonary disease)     quit smoking 1993  . Lung cancer     s/p L lobectomy 2001  . Central retinal vein occlusion of left eye     legally blind in Left eye  . Dysfunction of eustachian tube   . Irritable bowel syndrome   . HLD (hyperlipidemia)   . Urge incontinence   . Goiter, unspecified   . Diverticulosis   . Pulmonary hypertension     echo [facility] 12/2008 with EF >5%, grade II diastolic dysfunction, RSVP >41mmHg, ACE level normal.  f/u echo at [place] - EF 55-60%, mild MR, mild LAE, PASP 25 + RA (no pulm HTN)  . Collagen vascular disease     uncharacterized.  has seen rheum.  positive RF, positive serologies for Sjogrens.  C-ANCA/P-ANCA neg, ANA  neg,  anti-dsDNA neg, anti-SCL70 neg, anti-centromere Ab neg  . Insomnia     Related to HTN and vision loss  . Anxiety     Related to HTN and vision loss  . GERD (gastroesophageal reflux disease)   . Uterine cancer     s/p hysterectomy 2001  . Osteopenia   . Lupus   . HTN (hypertension)     difficult to control, hypertensive urgency hospitalizations, normal urine/plasma metanephrines and catecholamines, renal dopplers without stenosis  . Chronic kidney disease     with allergic interstitial nephritis on biopsy 2012    Past Surgical History  Procedure Date  . Lobectomy 04/1999    left lung  . Abdominal hysterectomy 06/1999  . Cholecystectomy 1982  . Tubal ligation 1975  . Appendectomy 1958  . Ct chest 2007    enlarged thyroid with goiter  . Ct abd w & pelvis wo cm 2010    neg except diverticulosis    Family History  Problem Relation Age of Onset  . Pneumonia Father   . Alcohol abuse Father   . Hypertension Mother     History  Substance Use Topics  . Smoking status: Former Smoker    Types: Cigarettes    Quit date: 07/04/1991  . Smokeless tobacco: Never Used   Comment: Quit in early 1980's.  . Alcohol Use: No  OB History    Grav Para Term Preterm Abortions TAB SAB Ect Mult Living                  Review of Systems ROS reviewed and all otherwise negative except for mentioned in HPI  Allergies  Valsartan  Home Medications   Current Outpatient Rx  Name Route Sig Dispense Refill  . AMLODIPINE BESY-BENAZEPRIL HCL 5-40 MG PO CAPS Oral Take 1 capsule by mouth every evening.     Marland Kitchen FOLIC ACID 1 MG PO TABS Oral Take 1 mg by mouth every evening.     Marland Kitchen LANSOPRAZOLE 15 MG PO CPDR Oral Take 15 mg by mouth daily.    Marland Kitchen LEVOTHYROXINE SODIUM 100 MCG PO TABS Oral Take 100 mcg by mouth daily.    Marland Kitchen LORAZEPAM 0.5 MG PO TABS Oral Take 0.5 mg by mouth at bedtime.    . METHOTREXATE CHEMO INJECTION (PF) 1 GM **50 MG/ML** Intravenous Inject 4 mg into the vein once a week. Inject on  Wednesdays or Thursdays.    Marland Kitchen ONE-DAILY MULTI VITAMINS PO TABS Oral Take 1 tablet by mouth daily.    . NEBIVOLOL HCL 10 MG PO TABS Oral Take 10 mg by mouth every evening.     Marland Kitchen NIFEDIPINE ER 60 MG PO TB24 Oral Take 60 mg by mouth every evening.     Marland Kitchen SPIRONOLACTONE 25 MG PO TABS Oral Take 25 mg by mouth 2 (two) times daily.    . CEPHALEXIN 500 MG PO CAPS Oral Take 1 capsule (500 mg total) by mouth 2 (two) times daily. 20 capsule 0    BP 134/52  Pulse 65  Temp 98.1 F (36.7 C) (Oral)  Resp 16  Ht 5' 4.96" (1.65 m)  Wt 131 lb 2.8 oz (59.5 kg)  BMI 21.85 kg/m2  SpO2 98% Vitals reviewed Physical Exam Physical Examination: General appearance - alert, well appearing, and in no distress Mental status - alert, oriented to person, place, and time Eyes - pupils equal and reactive, extraocular eye movements intact Mouth - mucous membranes moist, pharynx normal without lesions Neck - supple, no significant adenopathy Chest - clear to auscultation, no wheezes, rales or rhonchi, symmetric air entry Heart - normal rate, regular rhythm, normal S1, S2, no murmurs, rubs, clicks or gallops Abdomen - soft, nontender, nondistended, no masses or organomegaly Neurological - alert, oriented, normal speech, strength 5/5 in extremities x 4, sensation intact, cranial nerves 2-12 tested and intact, normal gait Musculoskeletal - no joint tenderness, deformity or swelling Extremities - peripheral pulses normal, no pedal edema, no clubbing or cyanosis Skin - normal coloration and turgor, no rashes Psych- normal mood and affect, pleasant  ED Course  Procedures (including critical care time) 6:47 PM went to see the patient, she is not yet in room   Date: 09/29/2011  Rate: *59  Rhythm: sinus bradycardia  QRS Axis: normal  Intervals: normal  ST/T Wave abnormalities: normal  Conduction Disutrbances:none  Narrative Interpretation:   Old EKG Reviewed: none available   Labs Reviewed  CBC WITH DIFFERENTIAL  - Abnormal; Notable for the following:    RBC 3.57 (*)     Hemoglobin 11.8 (*)     HCT 34.7 (*)     All other components within normal limits  COMPREHENSIVE METABOLIC PANEL - Abnormal; Notable for the following:    Glucose, Bld 172 (*)     BUN 29 (*)     Creatinine, Ser 1.36 (*)     GFR  calc non Af Amer 38 (*)     GFR calc Af Amer 44 (*)     All other components within normal limits  URINALYSIS, ROUTINE W REFLEX MICROSCOPIC - Abnormal; Notable for the following:    APPearance HAZY (*)     Hgb urine dipstick TRACE (*)     Leukocytes, UA LARGE (*)     All other components within normal limits  URINE MICROSCOPIC-ADD ON - Abnormal; Notable for the following:    Bacteria, UA FEW (*)     All other components within normal limits  TROPONIN I  LAB REPORT - SCANNED   No results found.   1. Urinary tract infection       MDM  Pt presenting after what sounds like near syncopal event today, she has also had increased fatigue over the past 2 weeks.  Normal neuro exam, EKG and labs reassuring as well as head CT.  She does have findings of UTI which may be a contributory factor to her symptoms.  She was given rx for antibiotics for UTI.  Discussed possibility of admission for syncopal workup/monitoring as well as possible TIA.  Pt strongly desires not to be admitted and states she will f/u with her doctor for any further tests that are necessary.  Discharged with strict return precautions.  Pt agreeable with plan.        Threasa Beards, MD 10/02/11 [phone]

## 2011-09-29 NOTE — Telephone Encounter (Signed)
Spoke to Yon[place] and was advised that patient was there visiting and playing cards. Was advised that patient felt faint, nauseated and felt like the room was spinning.   Leafy Ro states that she took her vital signs and they were normal. Patient was on her way to the car and almost passed out. Leafy Ro stated that she call 911 and they did a neuro exam on her and suggested that she go to the ER, but patient declined stating that she would have her friend drive her to her doctor's office. Dr. Damita Dunnings aware.

## 2011-09-29 NOTE — ED Notes (Signed)
Pt. Reports she was sitting down playing bridge with her friends when she suddenly felt fatigued and in a haze. States "It felt like someone turned off all my energy and I was only at 50% instead of 100%".  Denies LOC, numbness/tingling, N/V. Reports it lasting about a hour. Sent here by PCP. States "I feel like it is behind me now". No complaints.

## 2011-09-30 ENCOUNTER — Ambulatory Visit (INDEPENDENT_AMBULATORY_CARE_PROVIDER_SITE_OTHER): Payer: Medicare PPO | Admitting: Family Medicine

## 2011-09-30 ENCOUNTER — Encounter: Payer: Self-pay | Admitting: Family Medicine

## 2011-09-30 VITALS — BP 122/62 | HR 60 | Temp 98.1°F | Wt 129.5 lb

## 2011-09-30 DIAGNOSIS — N390 Urinary tract infection, site not specified: Secondary | ICD-10-CM

## 2011-09-30 MED ORDER — CEPHALEXIN 500 MG PO CAPS: 500.0000 mg | ORAL_CAPSULE | Freq: Two times a day (BID) | ORAL | Status: DC

## 2011-09-30 NOTE — Assessment & Plan Note (Signed)
She didn't have a typical syncopal episodes, EKG w/o sig change, CT w/o acute changes, serum labs at/near her baseline, u/a was positive.  She does have some dysuria.  UTI could have caused the episode.  The likelihood of a CVA that would be large enough to affect executive function w/o showing up on CT would be low, esp given the concurrent likely UTI that would cause the sx.  D/w pt.  She agrees.  She'll notify me if the sx continue after the abx are completed.  Changed to keflex bid due to renal clearance.

## 2011-09-30 NOTE — Progress Notes (Signed)
Seen at ER.  ER notes and labs reviewed.  She had some urinary frequency and lower abd cramping.  U/a was positive at the ER.  CT brain unremarkable.  No other presyncopal episodes.  No other complaints.   Meds, vitals, and allergies reviewed.   ROS: See HPI.  Otherwise, noncontributory.  nad ncat rrr ctab abd soft, not ttp, suprapubic area not ttp Ext w/o edema

## 2011-09-30 NOTE — Patient Instructions (Signed)
Take the keflex with a probiotic and let me know if you continue to have symptoms after you finish the medicine.

## 2011-10-03 ENCOUNTER — Telehealth: Payer: Self-pay | Admitting: Family Medicine

## 2011-10-03 NOTE — Telephone Encounter (Signed)
Patient said when she started taking the Keflex, she has been having a lot of gas,discomfort.  She's had a little diarrhea,but she expects that when she takes an antibiotic.  Patient wants to know if she should take something for her stomach.  Patient has to go to the dentist because she lost a tooth.  If she isn't home,please leave a detailed message on her answering machine.

## 2011-10-03 NOTE — Telephone Encounter (Signed)
Please call pt.  She could take gas X along with probiotic.  This should resolve as the abx are completed.  Thanks.

## 2011-10-06 NOTE — Telephone Encounter (Signed)
Patient advised.  She stated she went to talk to the pharmacist when she didn't get a call back and this is basically what she was told to do.

## 2011-10-07 ENCOUNTER — Telehealth: Payer: Self-pay

## 2011-10-07 MED ORDER — ASPIRIN 81 MG PO TABS: 81.0000 mg | ORAL_TABLET | Freq: Every day | ORAL | Status: AC

## 2011-10-07 MED ORDER — CEPHALEXIN 500 MG PO CAPS: 500.0000 mg | ORAL_CAPSULE | Freq: Three times a day (TID) | ORAL | Status: DC

## 2011-10-07 NOTE — Telephone Encounter (Signed)
I talked to Dr. Holley Raring.  We agreed that the likelihood of a CVA/TIA was low.  She wasn't on ASA at the time.  She wouldn't be an ASA failure, since she wasn't on the.  MRI wouldn't change the plan to restart ASA 81mg .  Also, she'll take keflex tid for 5 more days.  Check urine culture if she has return of sx.  I called pt re: the above.  She agrees.  If more dysuria or another event similar to prev, she'll notify the clinic.

## 2011-10-07 NOTE — Telephone Encounter (Signed)
I will d/w Dr. Holley Raring.

## 2011-10-07 NOTE — Telephone Encounter (Signed)
Pt saw Dr Holley Raring this AM; Dr Holley Raring suggested pt take baby ASA, increase Keflex  500 mg to three times a day and thinks pt should have MRI to r/o stroke. Pt wants Dr Josefine Class opinion on taking ASA, increasing Keflex and getting MRI. Pt will have Dr Elwyn Lade office fax copy of office note to Dr Damita Dunnings and pt said Dr Holley Raring is to call and speak with Dr Damita Dunnings also.Please advise.[facility].

## 2011-10-17 ENCOUNTER — Ambulatory Visit (INDEPENDENT_AMBULATORY_CARE_PROVIDER_SITE_OTHER): Payer: Medicare PPO | Admitting: Family Medicine

## 2011-10-17 ENCOUNTER — Encounter: Payer: Self-pay | Admitting: Family Medicine

## 2011-10-17 VITALS — BP 136/62 | HR 58 | Temp 98.3°F | Wt 135.8 lb

## 2011-10-17 DIAGNOSIS — R109 Unspecified abdominal pain: Secondary | ICD-10-CM

## 2011-10-17 NOTE — Progress Notes (Signed)
Abd pain and gas pain.  Worse since starting the keflex.  She's taking probiotics and gas-x w/o much relief.  She has had similar sx if constipated but not as painful (and it would usually resolve after a BM).  She is still regular but sx haven't resolved.  She's off keflex for 5 days w/o full relief.  She's had no more episodes like the prev episode playing cards with AMS.  No dysuria.  No FCV.  Some nausea (but that can occur at baseline after MTX injection).  No blood in stool.  Abd discomfort waxes and wanes but never fully resolved.  She had called Dr. Thana Farr and he had rec'd bentyl.  She hadn't tried it yet.    She also has pain in the R buttock, had similar episode years ago.  It is unclear if the abd pain is affecting posture, which could lead to a MSK source of buttock pain.   The abd pain is some better today, at the time of exam.   Meds, vitals, and allergies reviewed.   ROS: See HPI.  Otherwise, noncontributory.  nad ncad Mmm rrr ctab abd soft, very mild diffuse tenderness w/o mass/reboud/guarding Skin w/o rash No edema

## 2011-10-17 NOTE — Patient Instructions (Addendum)
Try the bentyl and notify us/Dr. Thana Farr if you aren't improved.  Take care.  If you have fever, blood in stool, or profound increase in pain, then notify the on call MD.

## 2011-10-19 DIAGNOSIS — R109 Unspecified abdominal pain: Secondary | ICD-10-CM | POA: Insufficient documentation

## 2011-10-19 NOTE — Assessment & Plan Note (Signed)
She'll update [place] next week, will try bentyl, may need futher eval by Dr. Earlean Shawl.  This could be due to the prev abx, but w/o fever, diarrhea, blood in stool I would see if the bentyl would help.  I very much doubt an ominous dx given that she hasn't acutely worsened over the duration of the sx, ie unlikely to be diverticulitis.  She agrees with plan.

## 2011-11-05 ENCOUNTER — Ambulatory Visit (INDEPENDENT_AMBULATORY_CARE_PROVIDER_SITE_OTHER): Payer: Medicare PPO | Admitting: Family Medicine

## 2011-11-05 ENCOUNTER — Encounter: Payer: Self-pay | Admitting: Family Medicine

## 2011-11-05 VITALS — BP 90/50 | HR 68 | Temp 98.2°F | Wt 130.8 lb

## 2011-11-05 DIAGNOSIS — R55 Syncope and collapse: Secondary | ICD-10-CM

## 2011-11-05 LAB — CBC WITH DIFFERENTIAL/PLATELET
Basophils Relative: 1 % (ref 0.0–3.0)
Eosinophils Absolute: 0.1 10*3/uL (ref 0.0–0.7)
HCT: 32.2 % — ABNORMAL LOW (ref 36.0–46.0)
Lymphs Abs: 0.9 10*3/uL (ref 0.7–4.0)
MCHC: 33.7 g/dL (ref 30.0–36.0)
MCV: 100 fl (ref 78.0–100.0)
Monocytes Absolute: 0.4 10*3/uL (ref 0.1–1.0)
Neutrophils Relative %: 67.8 % (ref 43.0–77.0)
RBC: 3.22 Mil/uL — ABNORMAL LOW (ref 3.87–5.11)

## 2011-11-05 LAB — BASIC METABOLIC PANEL
BUN: 34 mg/dL — ABNORMAL HIGH (ref 6–23)
CO2: 27 mEq/L (ref 19–32)
Chloride: 103 mEq/L (ref 96–112)
Creatinine, Ser: 1.5 mg/dL — ABNORMAL HIGH (ref 0.4–1.2)

## 2011-11-05 MED ORDER — NEBIVOLOL HCL 10 MG PO TABS: 5.0000 mg | ORAL_TABLET | Freq: Every evening | ORAL | Status: DC

## 2011-11-05 NOTE — Progress Notes (Signed)
Had f/u with GI and the abd sx are better.    Syncope yesterday.  She was squatting over at the sink and as she turned to look at her husband "he looked far away."  Then she stood up and passed out.  She knew she was going to pass out.  Syncope was witnessed, her husband helped her up.  The LOC was brief.  Husband got her into bed, checked BP and was 106/36.  Later in the day, her BP was able to gradually recover/normalize.  No shaking/twitch witnessed.  No tongue biting.  No focal neuro sx are known to be witnessed.  Feels back to prev baseline now, the way she did before this event.    She had other episode of possible syncope noted, see prev notes.   No CP, SOB, BLE edema.  Speech is at baseline.    Meds, vitals, and allergies reviewed.   ROS: See HPI.  Otherwise, noncontributory.  GEN: nad, alert and oriented HEENT: mucous membranes moist NECK: supple w/o LA CV: rrr. PULM: ctab, no inc wob ABD: soft, +bs EXT: no edema SKIN: no acute rash  EKG reviewed and compared to prev w/o sig change.

## 2011-11-05 NOTE — Patient Instructions (Signed)
Cut the bystolic to 5mg  a day.  Call me with an update on your BP next week.  See Rosaria Ferries about your referral before you leave today. Go to the lab on the way out.  We'll contact you with your lab report. Take care.

## 2011-11-06 NOTE — Assessment & Plan Note (Signed)
Likely due to low BP at the time, will dec bystolic and check echo.  EKG unremarkable.  No carotid bruit noted on exam.  Prev notes reviewed. >25 min spent with face to face with patient, >50% counseling and/or coordinating care.

## 2011-11-10 ENCOUNTER — Other Ambulatory Visit (INDEPENDENT_AMBULATORY_CARE_PROVIDER_SITE_OTHER): Payer: Medicare PPO

## 2011-11-10 ENCOUNTER — Other Ambulatory Visit: Payer: Self-pay

## 2011-11-10 DIAGNOSIS — R55 Syncope and collapse: Secondary | ICD-10-CM

## 2012-02-10 ENCOUNTER — Ambulatory Visit (INDEPENDENT_AMBULATORY_CARE_PROVIDER_SITE_OTHER): Payer: Medicare PPO | Admitting: Family Medicine

## 2012-02-10 ENCOUNTER — Encounter: Payer: Self-pay | Admitting: Family Medicine

## 2012-02-10 ENCOUNTER — Ambulatory Visit: Payer: Medicare PPO | Admitting: Family Medicine

## 2012-02-10 VITALS — BP 110/50 | HR 61 | Temp 97.4°F | Wt 134.8 lb

## 2012-02-10 DIAGNOSIS — M899 Disorder of bone, unspecified: Secondary | ICD-10-CM

## 2012-02-10 DIAGNOSIS — I10 Essential (primary) hypertension: Secondary | ICD-10-CM

## 2012-02-10 DIAGNOSIS — R5383 Other fatigue: Secondary | ICD-10-CM

## 2012-02-10 DIAGNOSIS — M949 Disorder of cartilage, unspecified: Secondary | ICD-10-CM

## 2012-02-10 DIAGNOSIS — Z1239 Encounter for other screening for malignant neoplasm of breast: Secondary | ICD-10-CM

## 2012-02-10 DIAGNOSIS — N183 Chronic kidney disease, stage 3 unspecified: Secondary | ICD-10-CM

## 2012-02-10 DIAGNOSIS — E7800 Pure hypercholesterolemia, unspecified: Secondary | ICD-10-CM

## 2012-02-10 DIAGNOSIS — E049 Nontoxic goiter, unspecified: Secondary | ICD-10-CM

## 2012-02-10 DIAGNOSIS — Z7189 Other specified counseling: Secondary | ICD-10-CM

## 2012-02-10 DIAGNOSIS — M8580 Other specified disorders of bone density and structure, unspecified site: Secondary | ICD-10-CM

## 2012-02-10 DIAGNOSIS — R5381 Other malaise: Secondary | ICD-10-CM

## 2012-02-10 NOTE — Patient Instructions (Addendum)
See Rosaria Ferries about your referral before you leave today. Come back for fasting labs.   Take care.  Glad to see you.

## 2012-02-11 ENCOUNTER — Encounter: Payer: Self-pay | Admitting: Family Medicine

## 2012-02-11 DIAGNOSIS — Z1239 Encounter for other screening for malignant neoplasm of breast: Secondary | ICD-10-CM | POA: Insufficient documentation

## 2012-02-11 DIAGNOSIS — Z7189 Other specified counseling: Secondary | ICD-10-CM | POA: Insufficient documentation

## 2012-02-11 NOTE — Progress Notes (Signed)
Long discussion with patient today re: health concerns.   H/o UTI with possible syncope.  No acute changes on CT at the time and we had deferred MRI as it wasn't expected to change mgmt at that time.  She continues to agree with this plan, as do I.  No other similar episodes in the meantime.    H/o goiter and due for f/u TSH.  She wouldn't want invasive testing, but is due for routine labs. No dysphagia.  Known thyroid enlargement w/o sig change per patient.  She does have fatigue.    Breast cancer screening.  D/w pt about options and she wouldn't want eval/treatment if abnormality found.  She is aware of risk/benefit of her position.    DXA d/w pt.  This is reasonable to check as it would potentially change management of her meds/treatment.  She agrees to proceed.  She is aware of possible long bone/atypical fx risk with bisphosphonates.    She has a living will with son Margaret Black if she were incapacitated.    Flu shot prev done.    CKD followed by renal and MTX continued to be managed by rheum.  We discussed that it is reasonable to continue f/u with both.    Hypertension:    Using medication without problems or lightheadedness: y Chest pain with exertion:n Edema:n Short of breath:n Due for lipids.    She has seen Dr. Earlean Shawl about her prev GI complaints.  PMH and SH reviewed  ROS: See HPI, otherwise noncontributory.  Meds, vitals, and allergies reviewed.   nad ncat Mmm Neck supple, TMG noted but not ttp rrr ctab abd soft, not ttp Ext w/o edema.

## 2012-02-11 NOTE — Assessment & Plan Note (Signed)
Will return for TSH and we'll adjust replacement if needed.  Her fatigue may be related to this or other chronic medical conditions.

## 2012-02-11 NOTE — Assessment & Plan Note (Signed)
Per renal and rheum.

## 2012-02-11 NOTE — Assessment & Plan Note (Signed)
DXA pending.

## 2012-02-11 NOTE — Assessment & Plan Note (Signed)
45 min spent with face to face with patient, >50% counseling and/or coordinating care

## 2012-02-11 NOTE — Assessment & Plan Note (Signed)
Return for lipid panel.  Has a healthy diet.

## 2012-02-11 NOTE — Assessment & Plan Note (Signed)
Controlled, renal function stable on last set of labs.

## 2012-02-11 NOTE — Assessment & Plan Note (Signed)
As above.

## 2012-02-12 ENCOUNTER — Encounter: Payer: Self-pay | Admitting: *Deleted

## 2012-02-26 ENCOUNTER — Encounter: Payer: Self-pay | Admitting: *Deleted

## 2012-02-26 ENCOUNTER — Other Ambulatory Visit (INDEPENDENT_AMBULATORY_CARE_PROVIDER_SITE_OTHER): Payer: Medicare PPO

## 2012-02-26 DIAGNOSIS — I10 Essential (primary) hypertension: Secondary | ICD-10-CM

## 2012-02-26 DIAGNOSIS — E049 Nontoxic goiter, unspecified: Secondary | ICD-10-CM

## 2012-02-26 LAB — LIPID PANEL
Total CHOL/HDL Ratio: 4
Triglycerides: 74 mg/dL (ref 0.0–149.0)

## 2012-02-26 LAB — TSH: TSH: 0.48 u[IU]/mL (ref 0.35–5.50)

## 2012-03-24 DIAGNOSIS — C73 Malignant neoplasm of thyroid gland: Secondary | ICD-10-CM

## 2012-03-24 HISTORY — DX: Malignant neoplasm of thyroid gland: C73

## 2012-03-24 HISTORY — PX: THYROIDECTOMY: SHX17

## 2012-04-06 ENCOUNTER — Ambulatory Visit (INDEPENDENT_AMBULATORY_CARE_PROVIDER_SITE_OTHER): Payer: Medicare PPO | Admitting: Family Medicine

## 2012-04-06 ENCOUNTER — Encounter: Payer: Self-pay | Admitting: Family Medicine

## 2012-04-06 VITALS — BP 104/50 | HR 54 | Temp 98.2°F

## 2012-04-06 DIAGNOSIS — R21 Rash and other nonspecific skin eruption: Secondary | ICD-10-CM | POA: Insufficient documentation

## 2012-04-06 DIAGNOSIS — J310 Chronic rhinitis: Secondary | ICD-10-CM | POA: Insufficient documentation

## 2012-04-06 MED ORDER — CLOTRIMAZOLE-BETAMETHASONE 1-0.05 % EX CREA: TOPICAL_CREAM | Freq: Two times a day (BID) | CUTANEOUS | Status: DC

## 2012-04-06 MED ORDER — FLUTICASONE PROPIONATE 50 MCG/ACT NA SUSP: NASAL | Status: DC

## 2012-04-06 MED ORDER — LORATADINE 10 MG PO TABS: 10.0000 mg | ORAL_TABLET | Freq: Every day | ORAL | Status: DC

## 2012-04-06 NOTE — Assessment & Plan Note (Signed)
Unclear source but not an alarming exam or history.  Would use claritin for now, add on flonase with epistaxis caution.  She agrees.  This doesn't appear infectious.

## 2012-04-06 NOTE — Progress Notes (Signed)
Rhinorrhea for the last month.  Clear with occ blood tinge in AM or with sneezing.  Dripping from the nose, anterior.  Taking 81mg  ASA.  No FNAVD, no ST.  "I feel fine, not like I have a cold."  This used to happen in the summer when she would go into air conditioning.  She didn't try claritin yet.    Rash on buttocks.  Present for 3-4 weeks.  Itchy.  Not painful.  R>L sided, crosses the midline.  Her abdominal pain has resolved over the last ~2 months.   Meds, vitals, and allergies reviewed.   ROS: See HPI.  Otherwise, noncontributory.  GEN: nad, alert and oriented HEENT: mucous membranes moist, tm w/o erythema, nasal exam w/o erythema, clear discharge noted with mildly dilated capillary bed w/o bleeding on the R side of the nasal septum,  OP without cobblestoning NECK: supple w/o LA SKIN: chaperoned exam with blanching red round patches, up to 2-3 cm in diameter.  Nondermatomal, more lesions on R>L buttock.  Some appear to be older and fainter.  No ulceration.  Some with central clearing.

## 2012-04-06 NOTE — Patient Instructions (Addendum)
Take claritin 10mg  a day.  If not better, add on the flonase.  Use the cream on the itchy areas and notify us if not improved.   Take care.

## 2012-04-06 NOTE — Assessment & Plan Note (Signed)
Possible fungal source.  Not likely to be related to lupus or a med rash.  Would use lotrisone for now and report back if needed.  She agrees.

## 2012-04-09 ENCOUNTER — Ambulatory Visit: Payer: Medicare PPO | Admitting: Family Medicine

## 2012-04-22 ENCOUNTER — Telehealth: Payer: Self-pay | Admitting: Family Medicine

## 2012-04-22 MED ORDER — RANITIDINE HCL 150 MG PO TABS: 150.0000 mg | ORAL_TABLET | Freq: Two times a day (BID) | ORAL | Status: DC

## 2012-04-22 NOTE — Telephone Encounter (Signed)
Call from Dr. Holley Raring.  The rash wasn't improving and he was going to have pt see derm.  I support this.  He was concerned for possible EM.  In addition, he has stopped PPI and changed her to zantac 150mg  po bid.

## 2012-04-26 ENCOUNTER — Encounter: Payer: Self-pay | Admitting: Family Medicine

## 2012-05-15 ENCOUNTER — Encounter (HOSPITAL_COMMUNITY): Payer: Self-pay | Admitting: Emergency Medicine

## 2012-05-15 ENCOUNTER — Emergency Department (HOSPITAL_COMMUNITY): Payer: Medicare PPO

## 2012-05-15 ENCOUNTER — Emergency Department (HOSPITAL_COMMUNITY)
Admission: EM | Admit: 2012-05-15 | Discharge: 2012-05-15 | Disposition: A | Discharge status: Home / Self Care | Payer: Medicare PPO | Attending: Emergency Medicine | Admitting: Emergency Medicine

## 2012-05-15 DIAGNOSIS — K219 Gastro-esophageal reflux disease without esophagitis: Secondary | ICD-10-CM | POA: Insufficient documentation

## 2012-05-15 DIAGNOSIS — Z8739 Personal history of other diseases of the musculoskeletal system and connective tissue: Secondary | ICD-10-CM | POA: Insufficient documentation

## 2012-05-15 DIAGNOSIS — Z8542 Personal history of malignant neoplasm of other parts of uterus: Secondary | ICD-10-CM | POA: Insufficient documentation

## 2012-05-15 DIAGNOSIS — Z85118 Personal history of other malignant neoplasm of bronchus and lung: Secondary | ICD-10-CM | POA: Insufficient documentation

## 2012-05-15 DIAGNOSIS — E039 Hypothyroidism, unspecified: Secondary | ICD-10-CM | POA: Insufficient documentation

## 2012-05-15 DIAGNOSIS — Z87448 Personal history of other diseases of urinary system: Secondary | ICD-10-CM | POA: Insufficient documentation

## 2012-05-15 DIAGNOSIS — Z8719 Personal history of other diseases of the digestive system: Secondary | ICD-10-CM | POA: Insufficient documentation

## 2012-05-15 DIAGNOSIS — F411 Generalized anxiety disorder: Secondary | ICD-10-CM | POA: Insufficient documentation

## 2012-05-15 DIAGNOSIS — Z79899 Other long term (current) drug therapy: Secondary | ICD-10-CM | POA: Insufficient documentation

## 2012-05-15 DIAGNOSIS — N189 Chronic kidney disease, unspecified: Secondary | ICD-10-CM | POA: Insufficient documentation

## 2012-05-15 DIAGNOSIS — I129 Hypertensive chronic kidney disease with stage 1 through stage 4 chronic kidney disease, or unspecified chronic kidney disease: Secondary | ICD-10-CM | POA: Insufficient documentation

## 2012-05-15 DIAGNOSIS — E785 Hyperlipidemia, unspecified: Secondary | ICD-10-CM | POA: Insufficient documentation

## 2012-05-15 DIAGNOSIS — H548 Legal blindness, as defined in USA: Secondary | ICD-10-CM | POA: Insufficient documentation

## 2012-05-15 DIAGNOSIS — Z7982 Long term (current) use of aspirin: Secondary | ICD-10-CM | POA: Insufficient documentation

## 2012-05-15 DIAGNOSIS — Z8679 Personal history of other diseases of the circulatory system: Secondary | ICD-10-CM | POA: Insufficient documentation

## 2012-05-15 DIAGNOSIS — M25569 Pain in unspecified knee: Secondary | ICD-10-CM

## 2012-05-15 DIAGNOSIS — Z8669 Personal history of other diseases of the nervous system and sense organs: Secondary | ICD-10-CM | POA: Insufficient documentation

## 2012-05-15 DIAGNOSIS — Z862 Personal history of diseases of the blood and blood-forming organs and certain disorders involving the immune mechanism: Secondary | ICD-10-CM | POA: Insufficient documentation

## 2012-05-15 DIAGNOSIS — J449 Chronic obstructive pulmonary disease, unspecified: Secondary | ICD-10-CM | POA: Insufficient documentation

## 2012-05-15 DIAGNOSIS — Z8639 Personal history of other endocrine, nutritional and metabolic disease: Secondary | ICD-10-CM | POA: Insufficient documentation

## 2012-05-15 DIAGNOSIS — J4489 Other specified chronic obstructive pulmonary disease: Secondary | ICD-10-CM | POA: Insufficient documentation

## 2012-05-15 DIAGNOSIS — E269 Hyperaldosteronism, unspecified: Secondary | ICD-10-CM | POA: Insufficient documentation

## 2012-05-15 DIAGNOSIS — Z87891 Personal history of nicotine dependence: Secondary | ICD-10-CM | POA: Insufficient documentation

## 2012-05-15 DIAGNOSIS — IMO0002 Reserved for concepts with insufficient information to code with codable children: Secondary | ICD-10-CM | POA: Insufficient documentation

## 2012-05-15 NOTE — ED Provider Notes (Signed)
History  This chart was scribed for Montine Circle, non-physician practitioner, working with Mylinda Latina III, MD by Jenne Campus, ED Scribe. This patient was seen in room TR07C/TR07C and the patient's care was started at 3:01 PM.  CSN: [id number]  Arrival date & time 05/15/12  1427    First MD Initiated Contact with Patient 12/22/11 1501  Chief Complaint  Patient presents with  . Knee Pain     The history is provided by the patient. No language interpreter was used.    Margaret Black is a 72 y.o. female who presents to the Emergency Department for chief complaint of approximately 21 hours of gradual onset, gradually worsening, constant left knee pain described as "twinges" that turned into throbbing that feels "inside the knee" and radiates into her left inguinal area with associated tingling from the knee to her foot along the shin. She reports taking tylenol with some improvement. She denies any recent falls or injuries to the area. She reports that she walks around her block everyday. She states that she has a h/o osteopenia and reports that she had a recent bone density scan that showed an "increase in deterioration"  in the cervical spine. She denies having a diagnosis of osteoporosis. She denies fever, sore throat, visual disturbance, CP, cough, nausea, urinary symptoms, back pain, leg weakness or swelling and rash as associated symptoms.  She also has a h/o COPD, HTN, lupus and CKD. She is a former smoker but denies alcohol use.   Past Medical History  Diagnosis Date  . Fatigue   . Hyperaldosteronism     serum aldo/renin ration elevated (aldo 23.7, renin <0.15), CT abd 2010 with normal adrenals, dedicated adrenal CT without adrenal adenoma,   . Hypothyroid   . COPD (chronic obstructive pulmonary disease)     quit smoking 1993  . Lung cancer     s/p L lobectomy 2001  . Central retinal vein occlusion of left eye     legally blind in Left eye  . Dysfunction of eustachian tube    . Irritable bowel syndrome   . HLD (hyperlipidemia)   . Urge incontinence   . Goiter, unspecified   . Diverticulosis   . Pulmonary hypertension     echo [facility] 12/2008 with EF >5%, grade II diastolic dysfunction, RSVP >51mmHg, ACE level normal.  f/u echo at [place] - EF 55-60%, mild MR, mild LAE, PASP 25 + RA (no pulm HTN)  . Collagen vascular disease     uncharacterized.  has seen rheum.  positive RF, positive serologies for Sjogrens.  C-ANCA/P-ANCA neg, ANA  neg, anti-dsDNA neg, anti-SCL70 neg, anti-centromere Ab neg  . Insomnia     Related to HTN and vision loss  . Anxiety     Related to HTN and vision loss  . GERD (gastroesophageal reflux disease)   . Uterine cancer     s/p hysterectomy 2001  . Osteopenia   . Lupus   . HTN (hypertension)     difficult to control, hypertensive urgency hospitalizations, normal urine/plasma metanephrines and catecholamines, renal dopplers without stenosis  . Chronic kidney disease     with allergic interstitial nephritis on biopsy 2012    Past Surgical History  Procedure Laterality Date  . Lobectomy  04/1999    left lung  . Abdominal hysterectomy  06/1999  . Cholecystectomy  1982  . Tubal ligation  1975  . Appendectomy  1958  . Ct chest  2007    enlarged thyroid with goiter  .  Ct abd w & pelvis wo cm  2010    neg except diverticulosis    Family History  Problem Relation Age of Onset  . Pneumonia Father   . Alcohol abuse Father   . Hypertension Mother     History  Substance Use Topics  . Smoking status: Former Smoker    Types: Cigarettes    Quit date: 07/04/1991  . Smokeless tobacco: Never Used     Comment: Quit in early 1980's.  . Alcohol Use: No    No OB history provided.  Review of Systems  A complete 10 system review of systems was obtained and all systems are negative except as noted in the HPI and PMH.   Allergies  Valsartan  Home Medications   Current Outpatient Rx  Name  Route  Sig  Dispense  Refill  .  amLODipine-benazepril (LOTREL) 5-40 MG per capsule   Oral   Take 1 capsule by mouth every evening.          Marland Kitchen aspirin 81 MG tablet   Oral   Take 1 tablet (81 mg total) by mouth daily.         . clotrimazole-betamethasone (LOTRISONE) cream   Topical   Apply topically 2 (two) times daily. As needed   30 g   0   . dicyclomine (BENTYL) 20 MG tablet      Take one by mouth every 4-6 hours as needed         . fluticasone (FLONASE) 50 MCG/ACT nasal spray      2 sprays per nostril per day   16 g   2   . folic acid (FOLVITE) 1 MG tablet   Oral   Take 1 mg by mouth every evening.          Marland Kitchen levothyroxine (SYNTHROID, LEVOTHROID) 100 MCG tablet   Oral   Take 100 mcg by mouth daily.         Marland Kitchen loratadine (CLARITIN) 10 MG tablet   Oral   Take 1 tablet (10 mg total) by mouth daily.         Marland Kitchen LORazepam (ATIVAN) 0.5 MG tablet   Oral   Take 0.5 mg by mouth at bedtime.         . methotrexate 1 G injection   Intravenous   Inject 4 mg into the vein once a week. Inject on Wednesdays or Thursdays.         . Multiple Vitamin (MULTIVITAMIN) tablet   Oral   Take 1 tablet by mouth daily.         . nebivolol (BYSTOLIC) 10 MG tablet   Oral   Take 0.5 tablets (5 mg total) by mouth every evening.         Marland Kitchen NIFEdipine (PROCARDIA-XL/ADALAT CC) 60 MG 24 hr tablet   Oral   Take 60 mg by mouth every evening.          . ranitidine (ZANTAC) 150 MG tablet   Oral   Take 1 tablet (150 mg total) by mouth 2 (two) times daily.         Marland Kitchen spironolactone (ALDACTONE) 25 MG tablet   Oral   Take 25 mg by mouth 2 (two) times daily.           Triage Vitals: BP 103/88  Pulse 66  Temp(Src) 97.8 F (36.6 C)  Resp 13  SpO2 100%  Physical Exam  Nursing note and vitals reviewed. Constitutional: She is oriented to person,  place, and time. She appears well-developed and well-nourished. No distress.  HENT:  Head: Normocephalic and atraumatic.  Eyes: Conjunctivae and EOM are  normal.  Neck: Neck supple. No tracheal deviation present.  Cardiovascular: Normal rate.   Pulmonary/Chest: Effort normal. No respiratory distress.  Musculoskeletal: Normal range of motion. She exhibits tenderness. She exhibits no edema.  Left knee is tender to palpation along medial and lateral joint lines, no obvious swelling or effusion, no ligamentous instability, varus and valgus testing were negative, Lachman and posterior drawer were negative  Neurological: She is alert and oriented to person, place, and time.  Skin: Skin is warm and dry.  Psychiatric: She has a normal mood and affect. Her behavior is normal.    ED Course  Procedures (including critical care time)  DIAGNOSTIC STUDIES: Oxygen Saturation is 100% on room air, normal by my interpretation.    COORDINATION OF CARE: 3:08 PM-Discussed treatment plan which includes XR of the left knee with pt at bedside and pt agreed to plan. Pt turned down pain medication.  3:56 PM- Informed pt of radiology results. Discussed discharge plan which includes knee sleeve, resting, 400 mg ibuprofen twice a day and icing the knee 3 times a day for 20 minutes with pt and pt agreed to plan. Also advised pt to stop an activity if the pain increases and to follow up with an orthopedist in one week if symptoms don't improve. Pt verbalized understanding and agrees to plan.  Labs Reviewed - No data to display Dg Knee Complete 4 Views Left  05/15/2012  *RADIOLOGY REPORT*  Clinical Data: Knee pain  LEFT KNEE - COMPLETE 4+ VIEW  Comparison: None.  Findings: Four views of the left knee submitted.  No acute fracture or subluxation.  No joint effusion.  No radiopaque foreign body.  IMPRESSION: No acute fracture or subluxation.   Original Report Authenticated By: Lahoma Crocker, M.D.      1. Knee pain       MDM  72 year old female with knee pain.  I believe the patient's pain to be due to overuse, and possibly a meniscal injury.  No bony abnormality.  The  patient is able to ambulate.  Recommended that the patient follow up with ortho in a week if no improvement with RICE and ibuprofen.      I personally performed the services described in this documentation, which was scribed in my presence. The recorded information has been reviewed and is accurate.     Montine Circle, PA-C 05/15/12 1605

## 2012-05-15 NOTE — ED Notes (Signed)
Ortho tech and Andris Baumann, PA at bedside

## 2012-05-15 NOTE — ED Notes (Signed)
Andris Baumann, PA at bedside

## 2012-05-15 NOTE — ED Notes (Signed)
Pt returned from X-ray.  

## 2012-05-15 NOTE — Progress Notes (Signed)
Orthopedic Tech Progress Note Patient Details:  Margaret Black 27-Aug-1940 [id number]  Ortho Devices Type of Ortho Device: Knee Sleeve Ortho Device/Splint Location: (L) LE Ortho Device/Splint Interventions: Application   Braulio Bosch 05/15/2012, 4:02 PM

## 2012-05-15 NOTE — ED Notes (Signed)
Called ortho tech to have knee sleeve applied

## 2012-05-15 NOTE — ED Notes (Signed)
Pt ambulatory leaving ED; pt leaving with d/c paperwork; Pt educated on at home pain management; pt given follow up instructions; pt verbalizes understanding of d/c teaching and has no further questions upon d/c. Reviewed vital signs with Montine Circle, PA and verified pt stable for d/c at this time.

## 2012-05-15 NOTE — ED Notes (Signed)
Left knee pain  Started yesterday now pain rads to groin and ankle no injury pt states she does walk every day

## 2012-05-16 NOTE — ED Provider Notes (Signed)
Medical screening examination/treatment/procedure(s) were performed by non-physician practitioner and as supervising physician I was immediately available for consultation/collaboration.   Mylinda Latina III, MD 05/16/12 1321

## 2012-05-17 ENCOUNTER — Telehealth: Payer: Self-pay

## 2012-05-17 NOTE — Telephone Encounter (Signed)
For simplicity, I would take the unused meds from this AM later tonight (AM/PM switch today).  Would restart regular routine tomorrow.

## 2012-05-17 NOTE — Telephone Encounter (Signed)
Pt was hurting this morning due to knee pain(pt trying to get appt with Dr Durward Fortes) and got her med box mixed up and took today's evening dose this morning at 8:30 am. Pt said she took Spironolactone,Bystolic, Lotrel and Folic acid last night 8: 30 pm and this morning took same med at 8:30 am in error. Pt said she was supposed to take Synthroid,NIfedipine and Baby ASA. Please advise. Pt said except for her knee pain she feels OK and will call if condition changes or worsens. Pt said she will not take any further med until she gets Dr Josefine Class advice if should take her morning meds or what pt should do.

## 2012-05-17 NOTE — Telephone Encounter (Signed)
Patient advised.

## 2012-05-24 ENCOUNTER — Other Ambulatory Visit: Payer: Self-pay | Admitting: *Deleted

## 2012-05-24 ENCOUNTER — Ambulatory Visit: Payer: Medicare PPO | Admitting: Family Medicine

## 2012-05-24 MED ORDER — LORAZEPAM 0.5 MG PO TABS: ORAL_TABLET | ORAL | Status: DC

## 2012-05-24 MED ORDER — LEVOTHYROXINE SODIUM 100 MCG PO TABS: 100.0000 ug | ORAL_TABLET | Freq: Every day | ORAL | Status: DC

## 2012-05-24 NOTE — Telephone Encounter (Signed)
I have no problem about setting her up with endo but they may want me to check her first.  We may need to get some pictures (possible u/s) and endo may want that before she gets there.  Please call in the lorazepam (verify the pharmacy- I selected phone in for [facility]).

## 2012-05-24 NOTE — Telephone Encounter (Signed)
Medication phoned to pharmacy.  Patient advised about message.

## 2012-05-24 NOTE — Telephone Encounter (Signed)
Pt wanted Synthroid and lorazepam rx faxed to [facility]. Synthroid rx sent electronically.

## 2012-05-24 NOTE — Telephone Encounter (Signed)
Patient was going to see you today for a lump on her throat.  She says it is a pea-sized nodule at the end of her goiter and it is very painful.  She wants to know if you would give her a referral to Endocrinology or must she see you first?

## 2012-05-25 ENCOUNTER — Other Ambulatory Visit: Payer: Self-pay | Admitting: *Deleted

## 2012-05-25 DIAGNOSIS — H348192 Central retinal vein occlusion, unspecified eye, stable: Secondary | ICD-10-CM | POA: Insufficient documentation

## 2012-05-26 ENCOUNTER — Encounter: Payer: Self-pay | Admitting: Family Medicine

## 2012-05-26 ENCOUNTER — Ambulatory Visit (INDEPENDENT_AMBULATORY_CARE_PROVIDER_SITE_OTHER): Payer: Medicare PPO | Admitting: Family Medicine

## 2012-05-26 VITALS — BP 116/54 | HR 58 | Temp 98.0°F | Wt 128.8 lb

## 2012-05-26 DIAGNOSIS — E049 Nontoxic goiter, unspecified: Secondary | ICD-10-CM

## 2012-05-26 DIAGNOSIS — E041 Nontoxic single thyroid nodule: Secondary | ICD-10-CM

## 2012-05-26 LAB — T4, FREE: Free T4: 1.22 ng/dL (ref 0.60–1.60)

## 2012-05-26 NOTE — Patient Instructions (Addendum)
Go to the lab on the way out.  We'll contact you with your lab report. See Rosaria Ferries about your referral before you leave today. Drop off the bone density test and I'll review it.  Take care.

## 2012-05-27 ENCOUNTER — Other Ambulatory Visit: Payer: Self-pay | Admitting: *Deleted

## 2012-05-27 ENCOUNTER — Telehealth: Payer: Self-pay | Admitting: *Deleted

## 2012-05-27 ENCOUNTER — Encounter: Payer: Self-pay | Admitting: Family Medicine

## 2012-05-27 MED ORDER — LORAZEPAM 0.5 MG PO TABS: ORAL_TABLET | ORAL | Status: DC

## 2012-05-27 MED ORDER — LEVOTHYROXINE SODIUM 100 MCG PO TABS: 100.0000 ug | ORAL_TABLET | Freq: Every day | ORAL | Status: DC

## 2012-05-27 NOTE — Telephone Encounter (Signed)
Refill sent.

## 2012-05-27 NOTE — Progress Notes (Signed)
Pt with mult ongoing issues- lupus with cutaneous manifestations, renal disease. She is getting second opinion at [facility] and I support this.  She would like to consolidate as much care as possible at [facility] and I also support that.    She has f/u with MRI for her L knee pending to eval for L knee pain, per ortho.  Recently with osteopenia on DXA but the report wasn't sent here.  Patient provided a copy after the visit.  We will inquire why this was not sent prev. She has some loss, but hasn't progressed to osteoporosis.   Now with tender anterior neck mass.  Noted recently.  Initially pain with swallowing. Pain would radiate up in the jaw.  Less pain now.  H/o goiter.  TSH prev wnl on replacement for hypothyroidism.  U/s is pending.    PMH and SH reviewed  ROS: See HPI, otherwise noncontributory.  Meds, vitals, and allergies reviewed.   nad ncat Mmm, no oral lesions Neck supple Goiter noted, ~1cm tender mass on the L lower side of thyroid, mobile with swallowing.   rrr ctab

## 2012-05-27 NOTE — Telephone Encounter (Signed)
Patient wants this Rx sent to [facility] rather than to [facility] where it was apparently sent.

## 2012-05-27 NOTE — Telephone Encounter (Signed)
Please fax to [facility], cancel the [facility] rx.  Thanks.

## 2012-05-27 NOTE — Assessment & Plan Note (Signed)
With a new tender nodule.  Mild dec in TSH but T3/T4 wnl.  I wouldn't change meds now.  Would get u/s and then have pt f/u with endo. She agrees. Will notify pt with results when available.  I appreciate help from [facility] for this pleasant patient.

## 2012-05-31 NOTE — Telephone Encounter (Signed)
Rx faxed to [facility].  Phones out at [facility] but will call to cancel Rx there as soon as they are up again.

## 2012-05-31 NOTE — Telephone Encounter (Signed)
[facility] advised to cancel Rx.

## 2012-06-01 ENCOUNTER — Ambulatory Visit: Payer: Self-pay | Admitting: Family Medicine

## 2012-06-01 ENCOUNTER — Encounter: Payer: Self-pay | Admitting: Family Medicine

## 2012-06-04 ENCOUNTER — Encounter: Payer: Self-pay | Admitting: Family Medicine

## 2012-06-11 DIAGNOSIS — R760 Raised antibody titer: Secondary | ICD-10-CM | POA: Insufficient documentation

## 2012-06-11 DIAGNOSIS — R231 Pallor: Secondary | ICD-10-CM | POA: Insufficient documentation

## 2012-06-24 ENCOUNTER — Telehealth: Payer: Self-pay | Admitting: Family Medicine

## 2012-06-24 NOTE — Telephone Encounter (Signed)
Medication clarification for Synthroid faxed to [facility] at [phone].

## 2012-06-29 ENCOUNTER — Other Ambulatory Visit: Payer: Self-pay

## 2012-06-29 DIAGNOSIS — C73 Malignant neoplasm of thyroid gland: Secondary | ICD-10-CM | POA: Insufficient documentation

## 2012-06-29 NOTE — Telephone Encounter (Signed)
Pt left v/m requesting refill lotrel and spironolactone to [facility]; advised pt done. When approving refills several warnings came up on screen; did not send refills until Dr Josefine Class review. Pt wanted Dr Damita Dunnings to know that her thyroid biopsy came back cancerous and pt is scheduled 07/29/12 for thyroid removal at [facility]

## 2012-06-30 DIAGNOSIS — Z85850 Personal history of malignant neoplasm of thyroid: Secondary | ICD-10-CM | POA: Insufficient documentation

## 2012-06-30 MED ORDER — SPIRONOLACTONE 25 MG PO TABS: 25.0000 mg | ORAL_TABLET | Freq: Two times a day (BID) | ORAL | Status: DC

## 2012-06-30 MED ORDER — AMLODIPINE BESY-BENAZEPRIL HCL 5-40 MG PO CAPS: 1.0000 | ORAL_CAPSULE | Freq: Every evening | ORAL | Status: DC

## 2012-06-30 NOTE — Telephone Encounter (Signed)
I sent the refills, would continue both.  I called her about the thyroid cancer dx and offered my condolences.  I'll await records from [facility].  She was taken off MTX in the meantime.  She thanked me for the call.

## 2012-07-14 DIAGNOSIS — Z85118 Personal history of other malignant neoplasm of bronchus and lung: Secondary | ICD-10-CM | POA: Insufficient documentation

## 2012-07-14 DIAGNOSIS — N10 Acute tubulo-interstitial nephritis: Secondary | ICD-10-CM | POA: Insufficient documentation

## 2012-07-22 ENCOUNTER — Encounter: Payer: Self-pay | Admitting: Family Medicine

## 2012-07-30 ENCOUNTER — Encounter (HOSPITAL_COMMUNITY): Payer: Self-pay | Admitting: Emergency Medicine

## 2012-07-30 ENCOUNTER — Telehealth: Payer: Self-pay | Admitting: Family Medicine

## 2012-07-30 ENCOUNTER — Emergency Department (HOSPITAL_COMMUNITY)
Admission: EM | Admit: 2012-07-30 | Discharge: 2012-07-30 | Disposition: A | Discharge status: Home / Self Care | Payer: Medicare PPO | Attending: Emergency Medicine | Admitting: Emergency Medicine

## 2012-07-30 DIAGNOSIS — Z8639 Personal history of other endocrine, nutritional and metabolic disease: Secondary | ICD-10-CM | POA: Insufficient documentation

## 2012-07-30 DIAGNOSIS — M949 Disorder of cartilage, unspecified: Secondary | ICD-10-CM | POA: Insufficient documentation

## 2012-07-30 DIAGNOSIS — Z7982 Long term (current) use of aspirin: Secondary | ICD-10-CM | POA: Insufficient documentation

## 2012-07-30 DIAGNOSIS — I129 Hypertensive chronic kidney disease with stage 1 through stage 4 chronic kidney disease, or unspecified chronic kidney disease: Secondary | ICD-10-CM | POA: Insufficient documentation

## 2012-07-30 DIAGNOSIS — Z85118 Personal history of other malignant neoplasm of bronchus and lung: Secondary | ICD-10-CM | POA: Insufficient documentation

## 2012-07-30 DIAGNOSIS — N189 Chronic kidney disease, unspecified: Secondary | ICD-10-CM | POA: Insufficient documentation

## 2012-07-30 DIAGNOSIS — IMO0001 Reserved for inherently not codable concepts without codable children: Secondary | ICD-10-CM

## 2012-07-30 DIAGNOSIS — R061 Stridor: Secondary | ICD-10-CM | POA: Insufficient documentation

## 2012-07-30 DIAGNOSIS — Z8719 Personal history of other diseases of the digestive system: Secondary | ICD-10-CM | POA: Insufficient documentation

## 2012-07-30 DIAGNOSIS — E039 Hypothyroidism, unspecified: Secondary | ICD-10-CM | POA: Insufficient documentation

## 2012-07-30 DIAGNOSIS — R05 Cough: Secondary | ICD-10-CM | POA: Insufficient documentation

## 2012-07-30 DIAGNOSIS — M899 Disorder of bone, unspecified: Secondary | ICD-10-CM | POA: Insufficient documentation

## 2012-07-30 DIAGNOSIS — Z85850 Personal history of malignant neoplasm of thyroid: Secondary | ICD-10-CM | POA: Insufficient documentation

## 2012-07-30 DIAGNOSIS — Z8544 Personal history of malignant neoplasm of other female genital organs: Secondary | ICD-10-CM | POA: Insufficient documentation

## 2012-07-30 DIAGNOSIS — Z79899 Other long term (current) drug therapy: Secondary | ICD-10-CM | POA: Insufficient documentation

## 2012-07-30 DIAGNOSIS — E0789 Other specified disorders of thyroid: Secondary | ICD-10-CM | POA: Insufficient documentation

## 2012-07-30 DIAGNOSIS — Z8739 Personal history of other diseases of the musculoskeletal system and connective tissue: Secondary | ICD-10-CM | POA: Insufficient documentation

## 2012-07-30 DIAGNOSIS — K219 Gastro-esophageal reflux disease without esophagitis: Secondary | ICD-10-CM | POA: Insufficient documentation

## 2012-07-30 DIAGNOSIS — E269 Hyperaldosteronism, unspecified: Secondary | ICD-10-CM | POA: Insufficient documentation

## 2012-07-30 DIAGNOSIS — Z8679 Personal history of other diseases of the circulatory system: Secondary | ICD-10-CM | POA: Insufficient documentation

## 2012-07-30 DIAGNOSIS — Z87891 Personal history of nicotine dependence: Secondary | ICD-10-CM | POA: Insufficient documentation

## 2012-07-30 DIAGNOSIS — F411 Generalized anxiety disorder: Secondary | ICD-10-CM | POA: Insufficient documentation

## 2012-07-30 DIAGNOSIS — Z8669 Personal history of other diseases of the nervous system and sense organs: Secondary | ICD-10-CM | POA: Insufficient documentation

## 2012-07-30 DIAGNOSIS — Z87448 Personal history of other diseases of urinary system: Secondary | ICD-10-CM | POA: Insufficient documentation

## 2012-07-30 DIAGNOSIS — Z862 Personal history of diseases of the blood and blood-forming organs and certain disorders involving the immune mechanism: Secondary | ICD-10-CM | POA: Insufficient documentation

## 2012-07-30 DIAGNOSIS — R059 Cough, unspecified: Secondary | ICD-10-CM | POA: Insufficient documentation

## 2012-07-30 NOTE — ED Notes (Signed)
Pt walked out without getting discharge instuctions

## 2012-07-30 NOTE — ED Notes (Signed)
BIB [facility]. SP thyroid surgery (07/29/12) at [facility]. D/C this am. Call to EMS for Respiratory Distress. Patient c/o difficulty breathing at times. Heavy secretions and productive cough. EMS: 2[facility] [place], stable transport

## 2012-07-30 NOTE — Telephone Encounter (Signed)
I called her.  In the interval, she got a call back from [facility] with advice about her BP meds for the weekend. I'll defer to [facility] and will see pt in f/u when possible.  She agrees.

## 2012-07-30 NOTE — Telephone Encounter (Signed)
Initially closed in error- see below. Thanks.

## 2012-07-30 NOTE — Telephone Encounter (Signed)
Ms. Kosin says that prior to surgery, the MD took her off Lotrel and placed her on Amlodipine.  He stated that he wanted more blood pumped into her kidneys, therefore he wanted her BP a little higher (~ [id number] systolic).  After surgery, they never started any of her other medications again.  They told her they didn't know what Nifedipine or Bystolic was and did not want her taking Spironolactone.  She says her pressure now is 185/78.  She says she thinks she needs to start back on something other than the Amlodipine but she doesn't know which one.  She is planning to come for an OV as soon as she gets on her feet in order to try to get some of this straight.

## 2012-07-30 NOTE — ED Notes (Signed)
Pt states she would like to go home. She states she feels better after coughing up that mucus plug. Pt states she doesn't want any more test and would like to go home.

## 2012-07-30 NOTE — Telephone Encounter (Signed)
Pt just got out of the hospital.  She states she cannot get her blood pressure back down because the hospital could not get her medications administered to her correctly.  She has taken her normal medication for today and her bp is in the 180s and lower number is bt 70 and 80 which is high for her. She just needs to know which of her bp meds can help her get back where she needs to be with her blood pressure.

## 2012-07-30 NOTE — Telephone Encounter (Signed)
Call her, verify her med list and let me know.  Thanks.

## 2012-07-31 NOTE — ED Provider Notes (Signed)
History     CSN: [id number]  Arrival date & time 07/30/12  2110   First MD Initiated Contact with Patient 07/30/12 2149      Chief Complaint  Patient presents with  . Shortness of Breath    (Consider location/radiation/quality/duration/timing/severity/associated sxs/prior treatment) Patient is a 72 y.o. female presenting with cough. The history is provided by the patient. No language interpreter was used.  Cough Cough characteristics:  Productive (Pt is a 72 yo woman who had thyroidectomy for thyroid cancer yesterday at [facility].  She was released this morning.) Sputum characteristics: She had thick mucus in her throat that was stuck in her throat, causing respiratory distress. Severity:  Severe Onset quality:  Sudden Duration:  1 hour Timing:  Constant Chronicity:  New Smoker: no   Context comment:  Recent surgery for which she was intubated. Relieved by:  Nothing Worsened by:  Nothing tried Ineffective treatments:  None tried Associated symptoms: no chills and no fever     Past Medical History  Diagnosis Date  . Fatigue   . Hyperaldosteronism     serum aldo/renin ration elevated (aldo 23.7, renin <0.15), CT abd 2010 with normal adrenals, dedicated adrenal CT without adrenal adenoma,   . Hypothyroid   . COPD (chronic obstructive pulmonary disease)     quit smoking 1993  . Lung cancer     s/p L lobectomy 2001  . Central retinal vein occlusion of left eye     legally blind in Left eye  . Dysfunction of eustachian tube   . Irritable bowel syndrome   . HLD (hyperlipidemia)   . Urge incontinence   . Goiter, unspecified   . Diverticulosis   . Pulmonary hypertension     echo [facility] 12/2008 with EF >5%, grade II diastolic dysfunction, RSVP >17mmHg, ACE level normal.  f/u echo at [place] - EF 55-60%, mild MR, mild LAE, PASP 25 + RA (no pulm HTN)  . Collagen vascular disease     uncharacterized.  has seen rheum.  positive RF, positive serologies for  Sjogrens.  C-ANCA/P-ANCA neg, ANA  neg, anti-dsDNA neg, anti-SCL70 neg, anti-centromere Ab neg  . Insomnia     Related to HTN and vision loss  . Anxiety     Related to HTN and vision loss  . GERD (gastroesophageal reflux disease)   . Uterine cancer     s/p hysterectomy 2001  . Osteopenia   . Lupus   . HTN (hypertension)     difficult to control, hypertensive urgency hospitalizations, normal urine/plasma metanephrines and catecholamines, renal dopplers without stenosis  . Chronic kidney disease     with allergic interstitial nephritis on biopsy 2012    Past Surgical History  Procedure Laterality Date  . Lobectomy  04/1999    left lung  . Abdominal hysterectomy  06/1999  . Cholecystectomy  1982  . Tubal ligation  1975  . Appendectomy  1958  . Ct chest  2007    enlarged thyroid with goiter  . Ct abd w & pelvis wo cm  2010    neg except diverticulosis    Family History  Problem Relation Age of Onset  . Pneumonia Father   . Alcohol abuse Father   . Hypertension Mother     History  Substance Use Topics  . Smoking status: Former Smoker    Types: Cigarettes    Quit date: 07/04/1991  . Smokeless tobacco: Never Used     Comment: Quit in early 1980's.  Marland Kitchen  Alcohol Use: No    OB History   Grav Para Term Preterm Abortions TAB SAB Ect Mult Living                  Review of Systems  Constitutional: Negative for fever and chills.  Eyes: Negative.   Respiratory: Positive for cough and stridor.   Cardiovascular: Negative.   Gastrointestinal: Negative.   Genitourinary: Negative.   Musculoskeletal: Negative.   Skin: Negative.   Neurological: Negative.   Psychiatric/Behavioral: Negative.     Allergies  Valsartan  Home Medications   Current Outpatient Rx  Name  Route  Sig  Dispense  Refill  . amLODipine (NORVASC) 5 MG tablet   Oral   Take 5 mg by mouth daily.         Marland Kitchen aspirin 81 MG tablet   Oral   Take 1 tablet (81 mg total) by mouth daily.         .  calcium carbonate (TUMS - DOSED IN MG ELEMENTAL CALCIUM) 500 MG chewable tablet   Oral   Chew 2 tablets by mouth daily.         Marland Kitchen levothyroxine (SYNTHROID, LEVOTHROID) 100 MCG tablet   Oral   Take 1 tablet (100 mcg total) by mouth daily.   90 tablet   3     Dispense as written.    Brand Only   . LORazepam (ATIVAN) 0.5 MG tablet   Oral   Take 0.125 mg by mouth at bedtime. Takes 1/4 tab at bedtime         . Multiple Vitamin (MULTIVITAMIN) tablet   Oral   Take 1 tablet by mouth daily.         . nebivolol (BYSTOLIC) 5 MG tablet   Oral   Take 5 mg by mouth at bedtime.         Marland Kitchen NIFEdipine (PROCARDIA-XL/ADALAT CC) 60 MG 24 hr tablet   Oral   Take 60 mg by mouth every evening.          . Probiotic Product (PROBIOTIC PO)   Oral   Take 1 capsule by mouth daily.         . ranitidine (ZANTAC) 150 MG tablet   Oral   Take 150 mg by mouth daily.         Marland Kitchen spironolactone (ALDACTONE) 25 MG tablet   Oral   Take 1 tablet (25 mg total) by mouth 2 (two) times daily.   180 tablet   1   . VITAMIN D, CHOLECALCIFEROL, PO   Oral   Take 1 tablet by mouth 2 (two) times daily.           BP 141/58  Pulse 67  Temp(Src) 98.5 F (36.9 C) (Oral)  Resp 18  SpO2 100%  Physical Exam  Nursing note and vitals reviewed. Constitutional: She is oriented to person, place, and time.  Slender elderly woman, with dressed transverse thyroidectomy wound on anterior neck.  She was somewhat apprehensive initially, but while I was taking her history she was able to cough and clear her secretions.  After that she was asymptomatic.  HENT:  Head: Normocephalic and atraumatic.  Right Ear: External ear normal.  Left Ear: External ear normal.  Mouth/Throat: Oropharynx is clear and moist.  Eyes: Conjunctivae and EOM are normal. Pupils are equal, round, and reactive to light. No scleral icterus.  Neck: Normal range of motion. Neck supple.  Dressed surgical wound on neck.  Breath sounds over  neck normal.   Cardiovascular: Normal rate, regular rhythm and normal heart sounds.   Pulmonary/Chest: Effort normal and breath sounds normal. No respiratory distress. She has no wheezes. She has no rales.  Abdominal: Soft. Bowel sounds are normal.  Musculoskeletal: Normal range of motion. She exhibits no edema and no tenderness.  Neurological: She is alert and oriented to person, place, and time.  No sensory or motor deficit.  Skin: Skin is warm and dry.  Psychiatric: She has a normal mood and affect. Her behavior is normal.    ED Course  Procedures (including critical care time)   Date: 07/31/2012  Rate: 67  Rhythm: normal sinus rhythm  QRS Axis: normal  Intervals: normal QRS:  Poof R wave progression in precordial leads suggests possible old anterior myocardial infarction.  ST/T Wave abnormalities: normal  Conduction Disutrbances:none  Narrative Interpretation:   Old EKG Reviewed: unchanged   Course in ED:  Pt was seen and had physical examination.  She coughed and cleared her airway spontaneously.  I had ordered chest x-ray and soft tissue neck x-ray, but while waiting for that pt continued to feel well and requested to forgo these tests and go home.  I advised her to rest propped up, and to drink plenty of liquids to liquify her secretions.    1. Postextubation stridor, initial encounter          Mylinda Latina III, MD 07/31/12 1201

## 2012-08-09 ENCOUNTER — Ambulatory Visit (INDEPENDENT_AMBULATORY_CARE_PROVIDER_SITE_OTHER): Payer: Medicare PPO | Admitting: Family Medicine

## 2012-08-09 ENCOUNTER — Encounter: Payer: Self-pay | Admitting: Family Medicine

## 2012-08-09 VITALS — BP 120/58 | HR 109 | Temp 97.8°F | Wt 126.2 lb

## 2012-08-09 DIAGNOSIS — R42 Dizziness and giddiness: Secondary | ICD-10-CM

## 2012-08-09 MED ORDER — MECLIZINE HCL 25 MG PO TABS: 12.5000 mg | ORAL_TABLET | Freq: Three times a day (TID) | ORAL | Status: DC | PRN

## 2012-08-09 MED ORDER — AZITHROMYCIN 250 MG PO TABS: ORAL_TABLET | ORAL | Status: DC

## 2012-08-09 NOTE — Patient Instructions (Addendum)
Try taking meclizine for a few days for the vertigo.  If you continue to have a runny nose and tooth pain, then start the antibiotics. Take care.

## 2012-08-09 NOTE — Progress Notes (Addendum)
Total thyroidectomy recently for cancer.  All lymph nodes neg per patient report.  No pain.  Swallowing well. Occ voice changes with deeper voice occ noted.    She had some postextubation stridor that resolved.  ER notes reviewed.  She had some sputum production afterward.  No fevers known.  She'll have some sweats and warm feeling.  Occ jittery feeling.  She has vertigo and nausea with head movement.  She has no sx with eye tracking if her head is still.   Vertigo is intermittent, only post surgery.  Some ear ringing, this is slightly more than baseline.  She was feeling better yesterday afternoon but then today she has more congestion on the R side of her face.  She has R sided tooth pain.  R eyelids are puffier than the L eyelids.   Recheck pulse is 66 and she has not noted a high pulse recently.  It appears the intake pulse was spurious.    Meds, vitals, and allergies reviewed.   ROS: See HPI.  Otherwise, noncontributory.  GEN: nad, alert and oriented HEENT: mucous membranes moist, tm w/o erythema, nasal exam w/o erythema, clear discharge noted,  OP with cobblestoning, sinuses not ttp but R eyelids are puffier than the L eyelids.  EOMI, PERRL NECK: supple w/o LA, bandaged surgery site noted CV: rrr.   PULM: ctab, no inc wob EXT: no edema SKIN: no acute rash

## 2012-08-09 NOTE — Assessment & Plan Note (Signed)
She has no fever, sinuses aren't ttp.  Would try meclizine first.  If continues to have tooth pain and rhinorrhea, then would use zmax and f/u prn.  Nontoxic. She agrees.

## 2012-08-10 ENCOUNTER — Telehealth: Payer: Self-pay | Admitting: *Deleted

## 2012-08-10 ENCOUNTER — Ambulatory Visit: Payer: Medicare PPO | Admitting: Family Medicine

## 2012-08-10 NOTE — Telephone Encounter (Signed)
Patient says that shortly after she left yesterday, she broke out in hives, except it is much bigger than hives.  She says it itches like crazy and her eye is more swollen.  She asks if it is okay for her to take Benadryl?

## 2012-08-10 NOTE — Telephone Encounter (Signed)
Yes, 25-50mg  q6h prn.  If SOB, then to ER. Thanks.

## 2012-08-10 NOTE — Telephone Encounter (Signed)
Patient advised.

## 2012-08-12 ENCOUNTER — Telehealth: Payer: Self-pay

## 2012-08-12 NOTE — Telephone Encounter (Signed)
Pt wanted Dr Damita Dunnings to know pt was seen at dermatologist 08/11/12 with contact dermititis; pt on steroid therapy.

## 2012-08-12 NOTE — Telephone Encounter (Signed)
Noted, thanks!

## 2012-09-01 ENCOUNTER — Other Ambulatory Visit: Payer: Self-pay

## 2012-09-01 MED ORDER — AMLODIPINE BESYLATE 2.5 MG PO TABS: 2.5000 mg | ORAL_TABLET | Freq: Every day | ORAL | Status: DC

## 2012-09-01 NOTE — Telephone Encounter (Signed)
Both sent 

## 2012-09-01 NOTE — Telephone Encounter (Signed)
Pt said received Lotrel 5-40 from [facility] ? Auto refill. Pt told [facility] to d/c any auto refill. Pt is going to return Lotrel to [facility]. Pt needs refill on Amlodipine 2.5 mg. Pt said Dr Damita Dunnings has not filled yet; pt has been getting Amlodipine from [facility]. Pt is no longer seeing doctor at [facility] ( doctor at [facility] has left [facility])  and wants Dr Damita Dunnings to start refilling Amlodipine to [facility] and also wants amlodipine 2.5 mg sent # 15 to [facility] .Please advise.Pt request call back when refill done.

## 2012-09-02 ENCOUNTER — Encounter: Payer: Self-pay | Admitting: Family Medicine

## 2012-09-02 ENCOUNTER — Other Ambulatory Visit: Payer: Self-pay | Admitting: Family Medicine

## 2012-09-02 ENCOUNTER — Telehealth: Payer: Self-pay

## 2012-09-02 ENCOUNTER — Ambulatory Visit (INDEPENDENT_AMBULATORY_CARE_PROVIDER_SITE_OTHER): Payer: Medicare PPO | Admitting: Family Medicine

## 2012-09-02 VITALS — BP 132/60 | HR 70 | Temp 97.9°F | Wt 126.5 lb

## 2012-09-02 DIAGNOSIS — I10 Essential (primary) hypertension: Secondary | ICD-10-CM

## 2012-09-02 DIAGNOSIS — E039 Hypothyroidism, unspecified: Secondary | ICD-10-CM

## 2012-09-02 DIAGNOSIS — R300 Dysuria: Secondary | ICD-10-CM

## 2012-09-02 DIAGNOSIS — C73 Malignant neoplasm of thyroid gland: Secondary | ICD-10-CM

## 2012-09-02 LAB — POCT URINALYSIS DIPSTICK
Glucose, UA: NEGATIVE
Ketones, UA: NEGATIVE
Leukocytes, UA: NEGATIVE
Protein, UA: NEGATIVE
Spec Grav, UA: <=1.005
Urobilinogen, UA: NEGATIVE

## 2012-09-02 NOTE — Patient Instructions (Addendum)
Keep the appointments at [facility] and with Dr. Holley Raring.  See what Dr. Holley Raring thinks about you seeing Dr. Jefm Bryant.   Go to the lab on the way out.  We'll contact you with your lab report. We'll fax your labs to [facility].

## 2012-09-02 NOTE — Telephone Encounter (Signed)
If she can come in at 4, we can turn the TSH around before the weekend.  I wouldn't change the appointment, unless she needed to be seen earlier due to her condition.

## 2012-09-02 NOTE — Progress Notes (Signed)
S/p thyroidectomy.  Still with a 'crummy' and 'foggy' feeling but no focal neuro changes.  Occ mild dysuria.  No dysphagia or stridor.  She occ has sweats and tremor.  These episodes self resolve.  She has f/u with [facility] pending.   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  We talked about options.  Still on plaquenil.  She was prev on steroids for a rash, resolved in the meantime.    Meds, vitals, and allergies reviewed.   ROS: See HPI.  Otherwise, noncontributory.  GEN: nad, alert and oriented HEENT: mucous membranes moist NECK: supple w/o LA, thyroidectomy scar noted, well healed CV: rrr. PULM: ctab, no inc wob ABD: soft, +bs EXT: no edema

## 2012-09-02 NOTE — Telephone Encounter (Signed)
Patient advised.

## 2012-09-02 NOTE — Telephone Encounter (Signed)
Pt left v/m; has appt already scheduled to see Dr Damita Dunnings today at 4 pm. Pt spoke with  [facility] Doctor and because of pt's shakiness and feeling out of it, doctor suggested pt get thyroid levels checked. Pt wants to know if needs to come in prior to 4 pm for lab test so can get report before the weekend. Please advise.

## 2012-09-03 ENCOUNTER — Encounter: Payer: Self-pay | Admitting: Family Medicine

## 2012-09-03 LAB — BASIC METABOLIC PANEL
CO2: 24 mEq/L (ref 19–32)
Calcium: 9.7 mg/dL (ref 8.4–10.5)
GFR: 33.66 mL/min — ABNORMAL LOW (ref >60.00)
Glucose, Bld: 110 mg/dL — ABNORMAL HIGH (ref 70–99)
Potassium: 4.3 mEq/L (ref 3.5–5.1)
Sodium: 139 mEq/L (ref 135–145)

## 2012-09-03 NOTE — Assessment & Plan Note (Signed)
Recheck basic labs today, will forward to [facility].  We'll go from there.   Overall, she'll talk with other MDs.  Plan is likely for transfer back to local care, with pt to talk to Dr. Holley Raring about possibly seeing Dr. Jefm Bryant for rheum f/u.  App help of all involved.

## 2012-09-13 ENCOUNTER — Telehealth: Payer: Self-pay | Admitting: Family Medicine

## 2012-09-13 DIAGNOSIS — D8989 Other specified disorders involving the immune mechanism, not elsewhere classified: Secondary | ICD-10-CM

## 2012-09-13 NOTE — Telephone Encounter (Signed)
Patient needs a medical referral to Dr. Jefm Bryant.

## 2012-09-13 NOTE — Telephone Encounter (Signed)
done

## 2012-09-20 ENCOUNTER — Ambulatory Visit (INDEPENDENT_AMBULATORY_CARE_PROVIDER_SITE_OTHER): Payer: Medicare PPO | Admitting: Family Medicine

## 2012-09-20 ENCOUNTER — Encounter: Payer: Self-pay | Admitting: Family Medicine

## 2012-09-20 VITALS — BP 116/58 | HR 65 | Temp 97.4°F

## 2012-09-20 DIAGNOSIS — R29818 Other symptoms and signs involving the nervous system: Secondary | ICD-10-CM

## 2012-09-20 DIAGNOSIS — R2689 Other abnormalities of gait and mobility: Secondary | ICD-10-CM

## 2012-09-20 DIAGNOSIS — R42 Dizziness and giddiness: Secondary | ICD-10-CM

## 2012-09-20 LAB — CBC WITH DIFFERENTIAL/PLATELET
Basophils Absolute: 0.1 10*3/uL (ref 0.0–0.1)
Eosinophils Absolute: 0.2 10*3/uL (ref 0.0–0.7)
HCT: 36.1 % (ref 36.0–46.0)
Lymphs Abs: 1.5 10*3/uL (ref 0.7–4.0)
MCHC: 34 g/dL (ref 30.0–36.0)
MCV: 100 fl (ref 78.0–100.0)
Monocytes Absolute: 0.6 10*3/uL (ref 0.1–1.0)
Neutrophils Relative %: 65.2 % (ref 43.0–77.0)
Platelets: 330 10*3/uL (ref 150.0–400.0)
RDW: 13.2 % (ref 11.5–14.6)

## 2012-09-20 LAB — COMPREHENSIVE METABOLIC PANEL
ALT: 20 U/L (ref 0–35)
Albumin: 4.2 g/dL (ref 3.5–5.2)
Alkaline Phosphatase: 73 U/L (ref 39–117)
Glucose, Bld: 123 mg/dL — ABNORMAL HIGH (ref 70–99)
Potassium: 4.5 mEq/L (ref 3.5–5.1)
Sodium: 138 mEq/L (ref 135–145)
Total Bilirubin: 0.5 mg/dL (ref 0.3–1.2)
Total Protein: 7.5 g/dL (ref 6.0–8.3)

## 2012-09-20 LAB — POCT URINALYSIS DIPSTICK
Blood, UA: NEGATIVE
Ketones, UA: NEGATIVE
Protein, UA: NEGATIVE
Spec Grav, UA: <=1.005

## 2012-09-20 NOTE — Patient Instructions (Addendum)
Don't change your meds for now.  Go to the lab on the way out.  We'll contact you with your lab report. See Margaret Black about your referral before you leave today. If you have a sudden change, then go to the ER or dial 911.  Take care.

## 2012-09-21 NOTE — Assessment & Plan Note (Signed)
Unclear if vertigo, presyncope, balance, facial changes are related.  Needs brainstem eval via MRI.  Proceed with imaging, renal function noted.  If worsening, to ER.  See notes on labs.  Would check ucx given UTI hx last year.  >25 min spent with face to face with patient, >50% counseling and/or coordinating care.  She agrees with plan.  D/w pt and husband.

## 2012-09-21 NOTE — Progress Notes (Signed)
She had an episode about 1 year ago with presyncope- head CT neg, ucx pos, possibly due to UTI at that point.  No other sx until recently.   About 1 month ago with vertigo, responded to meclizine and resolved.    In last week, again with lightheaded sensation, no syncope.  Noted on position change.  Now in the last few days, again with vertigo- room spins with head position change.  Unsteady on feet, tends to lean/drift to the R with walking right after standing up.  Also with R sided, V2 distribution, facial sensation changes.  No focal weakness in face or ext x4.  No FCV.  No trauma.  Some R sided HA, occ, lasts a few minutes, noted over the last few days.    Mult h/o cancers noted.   Meds, vitals, and allergies reviewed.   ROS: See HPI.  Otherwise, noncontributory.  nad but unsteady on feet when initially standing Tm wnl Nasal and OP exam wnl PERRL, EOMI Vertigo sx with head turning CN 2-12 wnl B, S/S wnl x4 except for slight dec in sensation on R V2 for light touch.  Neck supple rrr ctab abd soft Ext w/o edema

## 2012-09-23 ENCOUNTER — Other Ambulatory Visit: Payer: Self-pay | Admitting: Family Medicine

## 2012-09-23 LAB — URINE CULTURE: Colony Count: 25000

## 2012-09-23 MED ORDER — CIPROFLOXACIN HCL 500 MG PO TABS: 500.0000 mg | ORAL_TABLET | Freq: Two times a day (BID) | ORAL | Status: DC

## 2012-09-27 ENCOUNTER — Telehealth: Payer: Self-pay

## 2012-09-27 NOTE — Telephone Encounter (Signed)
Pt left v/m; pt is cancelling MRI scheduled on 09/28/12 due to pt taking antibiotics for abscessed tooth and UTI; pt is feeling so much better. Pt said last time symptoms were similar and was UTI.Pt wanted Dr Damita Dunnings to know pt is cancelling MRI.

## 2012-09-27 NOTE — Telephone Encounter (Signed)
Noted, if she has any residual symptoms- balance changes, vertigo, facial symptoms- then we need to get the MRI.  I'm glad she is doing better.

## 2012-09-27 NOTE — Telephone Encounter (Signed)
Left detailed message on voicemail.  

## 2012-09-29 ENCOUNTER — Other Ambulatory Visit: Payer: Self-pay | Admitting: Family Medicine

## 2012-09-29 MED ORDER — NEBIVOLOL HCL 5 MG PO TABS: 5.0000 mg | ORAL_TABLET | Freq: Two times a day (BID) | ORAL | Status: DC

## 2012-09-29 NOTE — Telephone Encounter (Signed)
Received refill request electronically from pharmacy. Last office visit 09/20/12. Dose and directions do not match match sheet. Please advise.

## 2012-09-29 NOTE — Telephone Encounter (Signed)
Would keep it at 5mg  BID.  rx corrected and send back.

## 2012-10-05 ENCOUNTER — Telehealth: Payer: Self-pay

## 2012-10-05 NOTE — Telephone Encounter (Signed)
I'll work on the hard copy.

## 2012-10-05 NOTE — Telephone Encounter (Signed)
[employer] faxed request for prior auth for Bystolic. Form on Dr Josefine Class desk.

## 2012-10-06 ENCOUNTER — Other Ambulatory Visit: Payer: Self-pay

## 2012-10-06 MED ORDER — NIFEDIPINE ER 60 MG PO TB24: 60.0000 mg | ORAL_TABLET | Freq: Every evening | ORAL | Status: DC

## 2012-10-06 MED ORDER — LEVOTHYROXINE SODIUM 88 MCG PO TABS: 88.0000 ug | ORAL_TABLET | Freq: Every day | ORAL | Status: DC

## 2012-10-06 NOTE — Telephone Encounter (Signed)
Pt request refill on Synthroid 88 mcg Name brand only to [facility]; pt said Surgeon at [facility] changed from 100 mcg to 88 mcg but pt request Dr Damita Dunnings to refill. Pt also request Nifedipine and Bystolic. I explained Bystolic has already been refilled and presently working on prior [place]. Pt voiced understanding. Due to complexity of pt I am sending refill of Nifedipine to Dr Damita Dunnings as well.

## 2012-10-06 NOTE — Telephone Encounter (Signed)
Sent. Thanks.   

## 2012-10-11 NOTE — Telephone Encounter (Signed)
[employer] faxed approval which is good until 10/08/13. Approval letter placed on Dr Josefine Class desk for signature and scanning.

## 2012-11-10 ENCOUNTER — Other Ambulatory Visit: Payer: Self-pay

## 2012-11-10 ENCOUNTER — Other Ambulatory Visit: Payer: Self-pay | Admitting: *Deleted

## 2012-11-10 MED ORDER — LORAZEPAM 1 MG PO TABS: 0.5000 mg | ORAL_TABLET | Freq: Every day | ORAL | Status: DC

## 2012-11-10 MED ORDER — SPIRONOLACTONE 25 MG PO TABS: 25.0000 mg | ORAL_TABLET | Freq: Two times a day (BID) | ORAL | Status: DC

## 2012-11-10 NOTE — Telephone Encounter (Signed)
Pt request refill spironolactone and pt request lorazepam to be changed back to 1 mg taking 1/2 tab at hs instead of 0.5 mg with present instructions. [facility]. Pt does not need cb unless problem with changing lorazepam to 1 mg.Please advise.

## 2012-11-10 NOTE — Telephone Encounter (Signed)
Left message on machine that rx is ready for pick-up, and it will be at our front desk.  

## 2012-11-10 NOTE — Addendum Note (Signed)
Addended by: Tonia Ghent on: 11/10/2012 11:19 PM   Modules accepted: Orders

## 2012-11-10 NOTE — Telephone Encounter (Signed)
Please call in 30 day supple on the lorazepam.

## 2012-11-10 NOTE — Telephone Encounter (Signed)
rx sent to pharmacy by e-script [facility]

## 2012-11-10 NOTE — Telephone Encounter (Signed)
Both printed, please fax in when I sign.  Thanks.

## 2012-11-10 NOTE — Telephone Encounter (Signed)
Patient calling upset that we can't fax rx to [facility], I advised that this is a narcotic and we can't just fax the script to a fax number also that we use security paper and the fax is hard to read. Pt is asking that a 30day supply be called into [facility] and she will pick up the written script. I also advised next time to ask [facility] to send [place] a refill request. rx sent to pharmacy by e-script for Spironolactone, [facility]  Ok to fill 30 day supply?

## 2012-11-11 ENCOUNTER — Telehealth: Payer: Self-pay

## 2012-11-11 ENCOUNTER — Encounter: Payer: Self-pay | Admitting: Family Medicine

## 2012-11-11 ENCOUNTER — Telehealth: Payer: Self-pay | Admitting: *Deleted

## 2012-11-11 DIAGNOSIS — E039 Hypothyroidism, unspecified: Secondary | ICD-10-CM

## 2012-11-11 NOTE — Telephone Encounter (Signed)
Order is in.

## 2012-11-11 NOTE — Telephone Encounter (Signed)
Pt came by office and requested from front desk a lab order for thyroid testing to be drawn at [facility] lab; [place] doctor changed thyroid med from 100 mcg to 88 mcg mid July. Pt request cb when can come back to have blood drawn.

## 2012-11-11 NOTE — Telephone Encounter (Signed)
Lab appt scheduled along with FU in a few days at patient's request.

## 2012-11-11 NOTE — Telephone Encounter (Signed)
Forms asking for Clarification of medications.  Form in your In Box.

## 2012-11-11 NOTE — Telephone Encounter (Signed)
Medication phoned to pharmacy.  

## 2012-11-11 NOTE — Telephone Encounter (Signed)
I'll address the hard copy.  

## 2012-11-22 ENCOUNTER — Encounter: Payer: Self-pay | Admitting: Family Medicine

## 2012-12-01 ENCOUNTER — Other Ambulatory Visit: Payer: Self-pay | Admitting: Family Medicine

## 2012-12-01 MED ORDER — ENALAPRIL MALEATE 2.5 MG PO TABS: 2.5000 mg | ORAL_TABLET | Freq: Every day | ORAL | Status: DC

## 2012-12-01 NOTE — Progress Notes (Signed)
Med list updated per renal notes.

## 2012-12-03 ENCOUNTER — Encounter: Payer: Self-pay | Admitting: Family Medicine

## 2012-12-07 ENCOUNTER — Other Ambulatory Visit (INDEPENDENT_AMBULATORY_CARE_PROVIDER_SITE_OTHER): Payer: Medicare PPO

## 2012-12-07 DIAGNOSIS — E039 Hypothyroidism, unspecified: Secondary | ICD-10-CM

## 2012-12-07 LAB — TSH: TSH: 1.99 u[IU]/mL (ref 0.35–5.50)

## 2012-12-10 ENCOUNTER — Encounter: Payer: Self-pay | Admitting: Family Medicine

## 2012-12-10 ENCOUNTER — Ambulatory Visit (INDEPENDENT_AMBULATORY_CARE_PROVIDER_SITE_OTHER): Payer: Medicare PPO | Admitting: Family Medicine

## 2012-12-10 VITALS — BP 142/64 | HR 76 | Temp 98.2°F

## 2012-12-10 DIAGNOSIS — Z23 Encounter for immunization: Secondary | ICD-10-CM

## 2012-12-10 DIAGNOSIS — E039 Hypothyroidism, unspecified: Secondary | ICD-10-CM

## 2012-12-10 NOTE — Patient Instructions (Addendum)
Recheck your TSH in about 5 months before a visit.  If you weight changes a lot in the meantime, then let me know.

## 2012-12-10 NOTE — Progress Notes (Signed)
She feels good "and I don't want to mess with the present form."  Getting ready to start the enalapril at 2.5mg  a day per renal.  Her overall rheum dx still isn't set in concrete, discussed.  Energy and appetite are good.  She feels better than she has in years.  Renal and rheum are local.  Still seeing [facility] for the eye clinic.  Off MTX.  D/w pt about TSH.    Meds, vitals, and allergies reviewed.   ROS: See HPI.  Otherwise, noncontributory.  GEN: nad, alert and oriented HEENT: mucous membranes moist NECK: supple w/o LA, no TMG, scar healed CV: rrr.   PULM: ctab, no inc wob ABD: soft, +bs EXT: no edema SKIN: no acute rash

## 2012-12-12 NOTE — Assessment & Plan Note (Signed)
tsh wnl, given her hx wouldn't wait 1 year to recheck.  She agrees.  Continue as is with meds.  She has f/u with renal and rheum pending. I didn't change anything else for now.  She agrees.

## 2012-12-29 ENCOUNTER — Other Ambulatory Visit: Payer: Self-pay

## 2012-12-29 NOTE — Telephone Encounter (Signed)
-  Spoke with Gay Filler pharmacist at [phone]; Gay Filler updated 10/06/12 to be name brand Synthroid, will take 24 hrs to update system and then refill will go out to pt. Pt notified and will contact [facility] on 12/31/12 to verify med has been sent.

## 2013-01-20 ENCOUNTER — Ambulatory Visit (INDEPENDENT_AMBULATORY_CARE_PROVIDER_SITE_OTHER): Payer: Medicare PPO | Admitting: Family Medicine

## 2013-01-20 ENCOUNTER — Encounter: Payer: Self-pay | Admitting: Family Medicine

## 2013-01-20 VITALS — BP 130/62 | Temp 98.2°F | Wt 135.8 lb

## 2013-01-20 DIAGNOSIS — N390 Urinary tract infection, site not specified: Secondary | ICD-10-CM

## 2013-01-20 DIAGNOSIS — R319 Hematuria, unspecified: Secondary | ICD-10-CM

## 2013-01-20 LAB — POCT URINALYSIS DIPSTICK
Glucose, UA: NEGATIVE
Ketones, UA: NEGATIVE
Protein, UA: NEGATIVE
Spec Grav, UA: <=1.005
Urobilinogen, UA: NEGATIVE
pH, UA: 6

## 2013-01-20 MED ORDER — CIPROFLOXACIN HCL 500 MG PO TABS: 500.0000 mg | ORAL_TABLET | Freq: Two times a day (BID) | ORAL | Status: DC

## 2013-01-20 NOTE — Assessment & Plan Note (Signed)
Presumed, cipro, ucx, nontoxic, f/u prn.  She agrees.

## 2013-01-20 NOTE — Progress Notes (Signed)
Burning and frequency this AM. Some blood in urine. Recent Cr 1.37. No fevers.  She feels a little lightheaded but this is typical for her with a UTI.  Some diffuse back pain, but mild.    Meds, vitals, and allergies reviewed.   ROS: See HPI.  Otherwise, noncontributory.  GEN: nad, alert and oriented HEENT: mucous membranes moist NECK: supple CV: rrr.  PULM: ctab, no inc wob ABD: soft, +bs, suprapubic area mildly tender EXT: no edema SKIN: no acute rash BACK: no CVA pain

## 2013-01-20 NOTE — Patient Instructions (Signed)
Drink plenty of water and start the antibiotics today.  We'll contact you with your lab report.  Take care.   

## 2013-01-22 LAB — URINE CULTURE
Colony Count: NO GROWTH
Organism ID, Bacteria: NO GROWTH

## 2013-03-01 ENCOUNTER — Other Ambulatory Visit: Payer: Self-pay | Admitting: Family Medicine

## 2013-04-28 ENCOUNTER — Telehealth: Payer: Self-pay | Admitting: Family Medicine

## 2013-04-28 DIAGNOSIS — N183 Chronic kidney disease, stage 3 unspecified: Secondary | ICD-10-CM

## 2013-04-28 DIAGNOSIS — Z87440 Personal history of urinary (tract) infections: Secondary | ICD-10-CM

## 2013-04-28 NOTE — Telephone Encounter (Signed)
I won't know about all of the labs to be tested until she comes in and I can check here.  We can try to get started before.   Initial orders are in.

## 2013-04-28 NOTE — Telephone Encounter (Signed)
Pt called and is requesting some lab work and then an apptmt.  Her symptoms are heart palpitations (she feels this is due to medication change after surgery); burning feet; pt feels "out of it"; pt says she felt this way in the past they found a bad UTI. She would also like her sugar checked. 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 She will make an apptmt, but would prefer you to order the lab work first.  Pt would like labs ASAP and I'll be happy to schedule if you agree and let me know. Could you please advise patient? Thank you.

## 2013-04-28 NOTE — Telephone Encounter (Signed)
Also, added note, I attempted to send pt to [facility] but she says she doesn't need triaged and this is non emergent, but she insists she needs labs prior to an apptmt.

## 2013-04-28 NOTE — Telephone Encounter (Signed)
Patient advised. Appointment scheduled.  

## 2013-04-29 ENCOUNTER — Ambulatory Visit: Payer: Medicare PPO | Admitting: Family Medicine

## 2013-05-02 ENCOUNTER — Encounter: Payer: Self-pay | Admitting: Family Medicine

## 2013-05-02 ENCOUNTER — Ambulatory Visit (INDEPENDENT_AMBULATORY_CARE_PROVIDER_SITE_OTHER): Payer: Medicare PPO | Admitting: Family Medicine

## 2013-05-02 VITALS — BP 118/68 | HR 64 | Temp 98.3°F | Wt 140.0 lb

## 2013-05-02 DIAGNOSIS — R269 Unspecified abnormalities of gait and mobility: Secondary | ICD-10-CM

## 2013-05-02 DIAGNOSIS — R002 Palpitations: Secondary | ICD-10-CM

## 2013-05-02 DIAGNOSIS — R55 Syncope and collapse: Secondary | ICD-10-CM

## 2013-05-02 NOTE — Progress Notes (Signed)
Pre-visit discussion using our clinic review tool. No additional management support is needed unless otherwise documented below in the visit note.  H/o syncope after presumed UTI in the past.  She had another episode that was slightly similar, except this time she felt hot.  She didn't have a fever but her face was red and she felt hot all over. "It was like I was standing on coals."  It went on for about 40 minutes.  She didn't pass out but felt presyncopal.  She didn't have tunnel vision.  She could still walk but she was dragging her R foot slightly at the time.  This was 6 days ago.  No other similar sx in the interval.   No fevers.  No CP.  Some palpitations recently.  Not SOB.  No BLE edema.    On ASA 81mg  a day.  Compliant with BP meds. Speech at baseline now and no motor changes now. Compliant with thyroid replacement.   Meds, vitals, and allergies reviewed.   ROS: See HPI.  Otherwise, noncontributory.  GEN: nad, alert and oriented HEENT: mucous membranes moist NECK: supple w/o LA, thyroid absent CV: rrr.  PULM: ctab, no inc wob ABD: soft, +bs EXT: no edema SKIN: no acute rash CN 2-12 wnl B, S/S/DTR wnl x4

## 2013-05-02 NOTE — Patient Instructions (Signed)
Your EKG didn't look significantly different from previous..  Go to the lab on the way out.  We'll contact you with your lab report. Margaret Black will call about your referral. Margaret Black go from there.  If you have another episode, then go to the ER.  Take care.

## 2013-05-03 DIAGNOSIS — R55 Syncope and collapse: Secondary | ICD-10-CM | POA: Insufficient documentation

## 2013-05-03 LAB — CBC WITH DIFFERENTIAL/PLATELET
Basophils Absolute: 0 10*3/uL (ref 0.0–0.1)
Basophils Relative: 0.2 % (ref 0.0–3.0)
EOS PCT: 6.9 % — AB (ref 0.0–5.0)
Eosinophils Absolute: 0.5 10*3/uL (ref 0.0–0.7)
HCT: 34.1 % — ABNORMAL LOW (ref 36.0–46.0)
HEMOGLOBIN: 11.4 g/dL — AB (ref 12.0–15.0)
LYMPHS ABS: 1.4 10*3/uL (ref 0.7–4.0)
Lymphocytes Relative: 20.6 % (ref 12.0–46.0)
MCHC: 33.3 g/dL (ref 30.0–36.0)
MCV: 98.8 fl (ref 78.0–100.0)
MONO ABS: 0.1 10*3/uL (ref 0.1–1.0)
Monocytes Relative: 2 % — ABNORMAL LOW (ref 3.0–12.0)
Neutro Abs: 4.9 10*3/uL (ref 1.4–7.7)
Neutrophils Relative %: 70.3 % (ref 43.0–77.0)
Platelets: 288 10*3/uL (ref 150.0–400.0)
RBC: 3.46 Mil/uL — ABNORMAL LOW (ref 3.87–5.11)
RDW: 13.2 % (ref 11.5–14.6)
WBC: 6.9 10*3/uL (ref 4.5–10.5)

## 2013-05-03 LAB — COMPREHENSIVE METABOLIC PANEL
ALK PHOS: 70 U/L (ref 39–117)
ALT: 18 U/L (ref 0–35)
AST: 21 U/L (ref 0–37)
Albumin: 4 g/dL (ref 3.5–5.2)
BILIRUBIN TOTAL: 0.4 mg/dL (ref 0.3–1.2)
BUN: 28 mg/dL — AB (ref 6–23)
CO2: 28 mEq/L (ref 19–32)
Calcium: 9.3 mg/dL (ref 8.4–10.5)
Chloride: 103 mEq/L (ref 96–112)
Creatinine, Ser: 1.4 mg/dL — ABNORMAL HIGH (ref 0.4–1.2)
GFR: 38.88 mL/min — ABNORMAL LOW (ref >60.00)
Glucose, Bld: 82 mg/dL (ref 70–99)
Potassium: 4 mEq/L (ref 3.5–5.1)
SODIUM: 139 meq/L (ref 135–145)
Total Protein: 7.2 g/dL (ref 6.0–8.3)

## 2013-05-03 LAB — URINE CULTURE
Colony Count: NO GROWTH
Organism ID, Bacteria: NO GROWTH

## 2013-05-03 LAB — TSH: TSH: 0.37 u[IU]/mL (ref 0.35–5.50)

## 2013-05-03 NOTE — Assessment & Plan Note (Signed)
She is 6 days out from an atypical episode.  EKG w/o changes.  Check basic labs today and ucx given her hx.  No dysuria.  Check MRI brain given the prev and now-resolved gait changes.  Unclear if this could have been a TIA.  Okay for outpatient f/u given the 6 day interval with lack of sx now.  She agrees.  We may or may not need to eval further with carotids and echo.  Will await labs and MRI. If another event, to ER.  She agrees. >25 minutes spent in face to face time with patient, >50% spent in counselling or coordination of care.

## 2013-05-11 ENCOUNTER — Other Ambulatory Visit: Payer: Self-pay | Admitting: *Deleted

## 2013-05-11 NOTE — Telephone Encounter (Signed)
Please call in to local pharmacy if that is the desired location.  If needing to go to mail order, let me know so I can print for faxing.

## 2013-05-11 NOTE — Telephone Encounter (Signed)
Received a faxed refill request from mail order pharmacy for Lorazepam 1 mg. Medication does not match medication sheet. Is it okay to fill medication?

## 2013-05-12 ENCOUNTER — Ambulatory Visit
Admission: RE | Admit: 2013-05-12 | Discharge: 2013-05-12 | Disposition: A | Discharge status: Home / Self Care | Payer: Medicare PPO | Source: Ambulatory Visit | Attending: Family Medicine | Admitting: Family Medicine

## 2013-05-12 ENCOUNTER — Other Ambulatory Visit: Payer: Self-pay | Admitting: Family Medicine

## 2013-05-12 DIAGNOSIS — R269 Unspecified abnormalities of gait and mobility: Secondary | ICD-10-CM

## 2013-05-12 DIAGNOSIS — R55 Syncope and collapse: Secondary | ICD-10-CM

## 2013-05-12 MED ORDER — LORAZEPAM 1 MG PO TABS: 0.5000 mg | ORAL_TABLET | Freq: Every day | ORAL | Status: DC

## 2013-05-12 NOTE — Telephone Encounter (Signed)
Printed.  Thanks.  

## 2013-05-12 NOTE — Telephone Encounter (Signed)
This is mail order, please print with 90 day supply and I will fax.

## 2013-05-12 NOTE — Telephone Encounter (Signed)
[facility] left v/m requesting cb today at [phone] for Ativan refill verbal order; pt is almost out of med.Please advise.

## 2013-05-12 NOTE — Telephone Encounter (Signed)
Faxed

## 2013-05-13 ENCOUNTER — Encounter (HOSPITAL_COMMUNITY): Payer: Medicare PPO

## 2013-05-13 ENCOUNTER — Other Ambulatory Visit (HOSPITAL_COMMUNITY): Payer: Self-pay | Admitting: *Deleted

## 2013-05-13 DIAGNOSIS — I6529 Occlusion and stenosis of unspecified carotid artery: Secondary | ICD-10-CM

## 2013-05-13 DIAGNOSIS — R55 Syncope and collapse: Secondary | ICD-10-CM

## 2013-05-23 ENCOUNTER — Encounter (HOSPITAL_COMMUNITY): Payer: Medicare PPO

## 2013-05-24 ENCOUNTER — Encounter (INDEPENDENT_AMBULATORY_CARE_PROVIDER_SITE_OTHER): Payer: Medicare PPO

## 2013-05-24 DIAGNOSIS — I6529 Occlusion and stenosis of unspecified carotid artery: Secondary | ICD-10-CM

## 2013-05-24 DIAGNOSIS — R55 Syncope and collapse: Secondary | ICD-10-CM

## 2013-05-26 ENCOUNTER — Other Ambulatory Visit: Payer: Medicare PPO

## 2013-05-27 ENCOUNTER — Ambulatory Visit (INDEPENDENT_AMBULATORY_CARE_PROVIDER_SITE_OTHER): Payer: Medicare PPO | Admitting: Family Medicine

## 2013-05-27 ENCOUNTER — Encounter: Payer: Self-pay | Admitting: Family Medicine

## 2013-05-27 VITALS — BP 126/70 | HR 62 | Temp 98.2°F | Wt 137.2 lb

## 2013-05-27 DIAGNOSIS — R05 Cough: Secondary | ICD-10-CM

## 2013-05-27 DIAGNOSIS — R059 Cough, unspecified: Secondary | ICD-10-CM

## 2013-05-27 MED ORDER — BENZONATATE 200 MG PO CAPS: 200.0000 mg | ORAL_CAPSULE | Freq: Three times a day (TID) | ORAL | Status: DC | PRN

## 2013-05-27 MED ORDER — AMOXICILLIN-POT CLAVULANATE 875-125 MG PO TABS: 1.0000 | ORAL_TABLET | Freq: Two times a day (BID) | ORAL | Status: DC

## 2013-05-27 NOTE — Patient Instructions (Signed)
Start augmentin today and use the tessalon for the cough.  Take care.  Try to get some rest.

## 2013-05-27 NOTE — Progress Notes (Signed)
Pre visit review using our clinic review tool, if applicable. No additional management support is needed unless otherwise documented below in the visit note.  Husband was sick initially.  Then she got sick. She's been sick for about 2 weeks.  She is wheezing and coughing, esp when laying down.  Clear sputum.  Stuffy nose.  Clear rhinorrhea.  No fevers.  Some chills.  No vomiting, no diarrhea. No rash.  No ear pain but her ears feel itchy.  Deep breath triggers the cough.  She feels like the cough comes from her throat.    We talked about the carotid study and the pending echo.    Meds, vitals, and allergies reviewed.   ROS: See HPI.  Otherwise, noncontributory.  GEN: nad, alert and oriented HEENT: mucous membranes moist, tm w/o erythema, nasal exam w/o erythema, clear discharge noted,  OP with cobblestoning, R max sinus ttp NECK: supple w/o LA CV: rrr.   PULM: upper airway noise noted but ctab o/w, no inc wob, no wheeze EXT: no edema SKIN: no acute rash

## 2013-05-28 DIAGNOSIS — R059 Cough, unspecified: Secondary | ICD-10-CM | POA: Insufficient documentation

## 2013-05-28 DIAGNOSIS — R05 Cough: Secondary | ICD-10-CM | POA: Insufficient documentation

## 2013-05-28 NOTE — Assessment & Plan Note (Signed)
From presumed sinusitis, with post nasal gtt.  Start augmentin, tessalon for cough, f/u prn.  Nontoxic. She agrees.

## 2013-06-02 ENCOUNTER — Telehealth: Payer: Self-pay | Admitting: *Deleted

## 2013-06-02 ENCOUNTER — Other Ambulatory Visit: Payer: Medicare PPO

## 2013-06-02 MED ORDER — ALBUTEROL SULFATE HFA 108 (90 BASE) MCG/ACT IN AERS: 2.0000 | INHALATION_SPRAY | Freq: Four times a day (QID) | RESPIRATORY_TRACT | Status: DC | PRN

## 2013-06-02 NOTE — Telephone Encounter (Signed)
Patient states she is no better.  She is not able to sleep because of the wheezing.  Still having chills but no fever.  Symptoms are basically the same as when she was here earlier this week but no better at all.  Please advise.

## 2013-06-02 NOTE — Telephone Encounter (Signed)
Then I would use albuterol in the meantime.  If not better with that, or if worse, then we need to recheck her and consider a CXR.  rx sent.  Thanks.

## 2013-06-02 NOTE — Telephone Encounter (Signed)
Patient advised.

## 2013-06-07 ENCOUNTER — Telehealth: Payer: Self-pay | Admitting: *Deleted

## 2013-06-07 NOTE — Telephone Encounter (Signed)
Margaret Black says she has finished the antibiotics and the cough is subdued somewhat but her sinuses are still jammed and sore to touch.  She sounds very congested and she says she is not feeling well because of the sinus pressure.  She says she has gotten some decongestants from the pharmacy but doesn't want to take anything until she hears from you because of mixing things with her other medications.  Please advise.

## 2013-06-07 NOTE — Telephone Encounter (Signed)
Patient advised.

## 2013-06-07 NOTE — Telephone Encounter (Signed)
An otc decongestant is reasonable to use in the meantime, unless it makes her jittery.  Wouldn't use at night. Warm compresses on the face may help with the nasal congestion too.  If she doesn't improved in soon, we may need to extend the antibiotics.  Have her update [place] if needed.  Thanks.

## 2013-06-08 ENCOUNTER — Telehealth: Payer: Self-pay

## 2013-06-08 MED ORDER — LEVOFLOXACIN 750 MG PO TABS: 750.0000 mg | ORAL_TABLET | ORAL | Status: DC

## 2013-06-08 NOTE — Telephone Encounter (Signed)
I would change the abx.  rx sent. Notify us if not better overall or if worsening.  Take levaquin every other day.  Thanks.

## 2013-06-08 NOTE — Telephone Encounter (Signed)
Patient notified as instructed by telephone. 

## 2013-06-08 NOTE — Telephone Encounter (Signed)
Pt left v/m; pt does not feel any better than when seen 05/27/2013; cough is slightly better but pt thinks needs another antibiotic to [facility]. No fever, SOB or wheezing. Non prod cough better with inhaler, right side of head feels packed with mucus. Decongestant not helping and pt not able to rest at night due to congestion.pt request cb. [facility].

## 2013-06-10 ENCOUNTER — Ambulatory Visit (INDEPENDENT_AMBULATORY_CARE_PROVIDER_SITE_OTHER)
Admission: RE | Admit: 2013-06-10 | Discharge: 2013-06-10 | Disposition: A | Discharge status: Home / Self Care | Payer: Medicare PPO | Source: Ambulatory Visit | Attending: Family Medicine | Admitting: Family Medicine

## 2013-06-10 ENCOUNTER — Ambulatory Visit: Payer: Medicare PPO | Admitting: Internal Medicine

## 2013-06-10 ENCOUNTER — Ambulatory Visit: Payer: Medicare PPO | Admitting: Family Medicine

## 2013-06-10 ENCOUNTER — Telehealth: Payer: Self-pay

## 2013-06-10 ENCOUNTER — Encounter: Payer: Self-pay | Admitting: Family Medicine

## 2013-06-10 ENCOUNTER — Ambulatory Visit (INDEPENDENT_AMBULATORY_CARE_PROVIDER_SITE_OTHER): Payer: Medicare PPO | Admitting: Family Medicine

## 2013-06-10 VITALS — BP 110/62 | HR 67 | Temp 97.9°F | Wt 137.2 lb

## 2013-06-10 DIAGNOSIS — R059 Cough, unspecified: Secondary | ICD-10-CM

## 2013-06-10 DIAGNOSIS — R05 Cough: Secondary | ICD-10-CM

## 2013-06-10 NOTE — Telephone Encounter (Signed)
That is fine with the patient.  Have you placed the order?

## 2013-06-10 NOTE — Telephone Encounter (Signed)
I would come in about a half hour before the OV to get the xray done, do it with one trip.  I can look at it at that point.

## 2013-06-10 NOTE — Telephone Encounter (Signed)
Pt has appt to see Dr Otilio Connors today at 3:30 pm and pt said her cough is much worse and request CXR this morning so Dr Damita Dunnings can review at office visit later today. Pt request cb ASAP.

## 2013-06-10 NOTE — Patient Instructions (Signed)
Finish the antibiotics and use the inhaler/decongestants.  Update me on Monday.  Take care.   If profoundly short of breath, then go to the ER.

## 2013-06-10 NOTE — Progress Notes (Signed)
Pre visit review using our clinic review tool, if applicable. No additional management support is needed unless otherwise documented below in the visit note.  F/u for cough.  Inc in sinus pressure and congestion.  No fevers.  Dec cough with SABA use.  Less wheeze.  Using SABA q6 hours, and it tends to last that long.  Recently restarted on abx.  Still with fair PO intake.  Not SOB.   Meds, vitals, and allergies reviewed.   ROS: See HPI.  Otherwise, noncontributory.  GEN: nad, alert and oriented HEENT: mucous membranes moist, tm w/o erythema, nasal exam w/o erythema, clear discharge noted,  OP with cobblestoning, R max sinus ttp NECK: supple w/o LA CV: rrr.   PULM: scattered rhonchi and occ wheeze but o/w ctab, no inc wob and no focal dec in BS EXT: no edema SKIN: no acute rash

## 2013-06-10 NOTE — Telephone Encounter (Signed)
It's in.

## 2013-06-13 NOTE — Assessment & Plan Note (Signed)
With persistent sinus pain but no PNA seen on CXR, likely scarring/old findings noted. Would continue SABA prn with abx as prescribed, this should improve.  She is renally dosed on abx.  She agrees with plan and will update me on Monday if needed.  If SOB, to ER.  I don't expect that to happen.

## 2013-06-14 ENCOUNTER — Telehealth: Payer: Self-pay | Admitting: *Deleted

## 2013-06-14 NOTE — Telephone Encounter (Signed)
I would continue as is, finish the abx and she should make slow progress. Thanks for the update.

## 2013-06-14 NOTE — Telephone Encounter (Signed)
Patient says her cough is less deep and a little less frequent but her head congestion is still just as thick and heavy as ever.  When she can get something out, it is thick and yellow.  Still taking medication as instructed.

## 2013-06-14 NOTE — Telephone Encounter (Signed)
Patient advised.

## 2013-06-16 ENCOUNTER — Telehealth: Payer: Self-pay | Admitting: *Deleted

## 2013-06-16 MED ORDER — LEVOFLOXACIN 750 MG PO TABS: 750.0000 mg | ORAL_TABLET | ORAL | Status: DC

## 2013-06-16 MED ORDER — PREDNISONE 20 MG PO TABS: ORAL_TABLET | ORAL | Status: DC

## 2013-06-16 NOTE — Telephone Encounter (Signed)
Margaret Black says she has started coughing all night again and she only has 1 more antibiotic remaining.  She still sounds very congested and says she feels terrible.  Please advise.

## 2013-06-16 NOTE — Telephone Encounter (Signed)
Patient advised.

## 2013-06-16 NOTE — Telephone Encounter (Signed)
I would extend the abx and add on prednisone.  Take the pred with food.  40 mg for 3 days, then 20mg  for 3 days.   If not better, we'll have to consider sinus vs chest imaging (ie CT).  rxs sent.  Thanks. Have her update as in a few days.  Thanks.

## 2013-06-17 ENCOUNTER — Other Ambulatory Visit: Payer: Medicare PPO

## 2013-06-21 ENCOUNTER — Telehealth: Payer: Self-pay | Admitting: *Deleted

## 2013-06-21 ENCOUNTER — Other Ambulatory Visit: Payer: Self-pay | Admitting: *Deleted

## 2013-06-21 MED ORDER — LEVOTHYROXINE SODIUM 88 MCG PO TABS: 88.0000 ug | ORAL_TABLET | Freq: Every day | ORAL | Status: DC

## 2013-06-21 NOTE — Telephone Encounter (Signed)
Patient is feeling better and actually went out yesterday to run some errands.  However she had an incident this morning with heavy coughing, from the bottom of her stomach and she coughed up some sputum that was blood-tinged.  She says if this is to be expected, that's fine but she just wanted to report it.  Please advise.

## 2013-06-21 NOTE — Telephone Encounter (Signed)
I'm not surprised, given the duration of the illness. If limited and an isolated event, then this should just resolve.  Likely from irritation/straining/coughing.  Thanks.

## 2013-06-21 NOTE — Telephone Encounter (Signed)
Left detailed message on voicemail.  

## 2013-07-26 ENCOUNTER — Other Ambulatory Visit: Payer: Self-pay

## 2013-07-26 MED ORDER — RANITIDINE HCL 150 MG PO TABS: 150.0000 mg | ORAL_TABLET | Freq: Every day | ORAL | Status: DC

## 2013-07-26 NOTE — Telephone Encounter (Signed)
Pt left v/m requesting refill ranitidine to [facility]. Pt notifed done.

## 2013-08-03 ENCOUNTER — Encounter: Payer: Self-pay | Admitting: Family Medicine

## 2013-08-03 ENCOUNTER — Ambulatory Visit (INDEPENDENT_AMBULATORY_CARE_PROVIDER_SITE_OTHER): Payer: Medicare PPO | Admitting: Family Medicine

## 2013-08-03 VITALS — BP 108/62 | HR 60 | Temp 98.0°F | Wt 136.2 lb

## 2013-08-03 DIAGNOSIS — J019 Acute sinusitis, unspecified: Secondary | ICD-10-CM | POA: Insufficient documentation

## 2013-08-03 DIAGNOSIS — R498 Other voice and resonance disorders: Secondary | ICD-10-CM

## 2013-08-03 DIAGNOSIS — R499 Unspecified voice and resonance disorder: Secondary | ICD-10-CM

## 2013-08-03 MED ORDER — LEVOFLOXACIN 750 MG PO TABS: 750.0000 mg | ORAL_TABLET | ORAL | Status: DC

## 2013-08-03 NOTE — Assessment & Plan Note (Signed)
Nontoxic, d/w pt.  Prev failed augmentin earlier this year.  Start levaquin, renally dosed.  No steroids orally as she isn't wheezing today.  With chronic voice changes.  Will get ENT eval after her current illness is resolved.  She agrees.

## 2013-08-03 NOTE — Progress Notes (Signed)
Pre visit review using our clinic review tool, if applicable. No additional management support is needed unless otherwise documented below in the visit note.  Recently with R maxillary pain.  Started about 2 weeks ago.  No fevers known/documented.  R ear pain. No L sided sx.  Her lungs still feel clear but she has frequent voice changes and throat irritation.  Thyroid surgery about 1 year ago, throat sx more noted since the surgery.    She has used her inhaler rarely.    Meds, vitals, and allergies reviewed.   ROS: See HPI.  Otherwise, noncontributory.  GEN: nad, alert and oriented HEENT: mucous membranes moist, tm w/o erythema, nasal exam w/o erythema, clear discharge noted,  OP with cobblestoning, R max ttp NECK: supple w/o LA CV: rrr.   PULM: ctab, no inc wob EXT: no edema SKIN: no acute rash

## 2013-08-03 NOTE — Patient Instructions (Addendum)
Start the antibiotics today (taken every other day) and Rosaria Ferries will call about the ENT appointment. Take care. Try to get some rest.

## 2013-09-06 ENCOUNTER — Telehealth: Payer: Self-pay | Admitting: *Deleted

## 2013-09-06 NOTE — Telephone Encounter (Signed)
[facility] says the patient states that the prescription should have been different than you prescribed.  The Rx is listed correctly according to the meds list in the chart.  Fax is in your In Box.  Please advise.

## 2013-09-07 NOTE — Telephone Encounter (Signed)
Left message on voicemail and at home number to call back.

## 2013-09-07 NOTE — Telephone Encounter (Signed)
Spoke to patient and was advised that she takes Ranitidine daily, but wants it changed from a tablet to a capsule. Patient stated that she will be careful taking the medication until she gets it changed back to the capsule.  Form is in your in box.

## 2013-09-07 NOTE — Telephone Encounter (Signed)
Please clarify this with the patient and let me know.  I'll hold the form for now. Thanks.

## 2013-09-07 NOTE — Telephone Encounter (Signed)
Patient returned Regina's call.  Patient's rx has been a little brown capsule.  The last rx she received was a little round red pill which is the same as her Nifedical.  It confuses her when she travels.  She wants to go back to the little brown capsule, so she doesn't get confused.  Patient said she'll be home for the next hour, if you have any questions.

## 2013-09-08 NOTE — Telephone Encounter (Signed)
Signed. Thanks.

## 2013-09-08 NOTE — Telephone Encounter (Signed)
I attempted to call pt, no answer, and no voicemail. I will fax forms back to [facility].

## 2013-09-30 ENCOUNTER — Ambulatory Visit: Payer: Self-pay | Admitting: Oncology

## 2013-10-17 ENCOUNTER — Telehealth: Payer: Self-pay | Admitting: Family Medicine

## 2013-10-17 ENCOUNTER — Telehealth: Payer: Self-pay

## 2013-10-17 MED ORDER — NIFEDIPINE ER OSMOTIC RELEASE 60 MG PO TB24: ORAL_TABLET | ORAL | Status: DC

## 2013-10-17 NOTE — Telephone Encounter (Signed)
Pt left v/m requesting refill Nifedical XL to [facility]. Advised done. Advised pt should schedule appt with Dr Damita Dunnings. Pt wanted Dr Damita Dunnings to be aware pt has been seeing Dr Oliva Bustard and pt was advised that she has Emphysema with chronic bronchitis; pt also has small nodule in lt lung. Pt has a lot of head congestion, pressure behind eye and dizziness on and off. Pt wants to know if she should get referral to see ENT. Pt request cb.

## 2013-10-17 NOTE — Telephone Encounter (Signed)
Patient says that Dr. Metro Kung office is getting her set up with an ENT appt.  Patient says that if for some reason this doesn't work out, she will make an appt to come in for an [place].

## 2013-10-17 NOTE — Telephone Encounter (Signed)
Lisa @ [facility] called she stated ms Wisener needed to get a referrral to an ENT  Dr Ivory Broad stated she would make appointment for ms Fredrick she stated ms Harnden has [place] insurance

## 2013-10-17 NOTE — Telephone Encounter (Signed)
Can she get seen here before we potentially refer her out? Please try to get the records from [facility] in the meantime.  Thanks.

## 2013-10-19 NOTE — Telephone Encounter (Signed)
Ms Sarwar doesnt have the [facility] insurance and she has left me 3 messages that she will make her own ENT appt in [facility] where she was seen before.

## 2013-10-22 ENCOUNTER — Ambulatory Visit: Payer: Self-pay | Admitting: Oncology

## 2013-11-09 ENCOUNTER — Other Ambulatory Visit: Payer: Self-pay | Admitting: Family Medicine

## 2013-11-10 NOTE — Telephone Encounter (Signed)
Pt called to ck on status of Bystolic refill; advised refill already done and sent to [facility] mail order. Pt appreciative.

## 2013-11-25 ENCOUNTER — Other Ambulatory Visit: Payer: Self-pay

## 2013-11-25 MED ORDER — LEVOTHYROXINE SODIUM 88 MCG PO TABS: 88.0000 ug | ORAL_TABLET | Freq: Every day | ORAL | Status: DC

## 2013-11-25 NOTE — Telephone Encounter (Signed)
Pt request refill name brand synthroid to [facility] order. Advised done.

## 2013-12-15 DIAGNOSIS — M329 Systemic lupus erythematosus, unspecified: Secondary | ICD-10-CM | POA: Insufficient documentation

## 2013-12-22 ENCOUNTER — Other Ambulatory Visit: Payer: Self-pay

## 2013-12-22 MED ORDER — SPIRONOLACTONE 25 MG PO TABS: 25.0000 mg | ORAL_TABLET | Freq: Two times a day (BID) | ORAL | Status: DC

## 2013-12-22 NOTE — Telephone Encounter (Signed)
Pt request refill spironolactone to [facility] order pharmacy. Advised pt done and pt plans to see Dr Damita Dunnings early 2016.

## 2014-01-11 ENCOUNTER — Other Ambulatory Visit: Payer: Self-pay | Admitting: *Deleted

## 2014-01-11 MED ORDER — LORAZEPAM 1 MG PO TABS: 0.5000 mg | ORAL_TABLET | Freq: Every day | ORAL | Status: DC

## 2014-01-11 NOTE — Telephone Encounter (Signed)
Rx faxed to mail order pharmacy.

## 2014-01-11 NOTE — Telephone Encounter (Signed)
Ok to refill 

## 2014-01-11 NOTE — Telephone Encounter (Signed)
Printed, please fax in after I sign.  Thanks.

## 2014-02-09 ENCOUNTER — Other Ambulatory Visit: Payer: Self-pay

## 2014-02-09 MED ORDER — NIFEDIPINE ER OSMOTIC RELEASE 60 MG PO TB24: ORAL_TABLET | ORAL | Status: DC

## 2014-02-09 MED ORDER — RANITIDINE HCL 150 MG PO TABS: 150.0000 mg | ORAL_TABLET | Freq: Every day | ORAL | Status: DC

## 2014-02-09 NOTE — Telephone Encounter (Signed)
Sent. Schedule CPE for the spring of 2016.  Thanks.

## 2014-02-09 NOTE — Telephone Encounter (Signed)
Pt left v/m requesting refills nifedipine and ranitidine to [facility] mail order pharmacy; pt has nifedipine now but no refills available; pt last seen for sinusitis on 08/03/13; not sure last CPX. Is OK to refill or does pt need appt?

## 2014-02-13 ENCOUNTER — Telehealth: Payer: Self-pay | Admitting: Family Medicine

## 2014-02-13 NOTE — Telephone Encounter (Signed)
Pt left v/m requesting status of refills; pt request cb.

## 2014-02-13 NOTE — Telephone Encounter (Signed)
See refill from 02/09/14. Patient notified that scripts have been sent to the pharmacy per Dr. Damita Dunnings.

## 2014-02-13 NOTE — Telephone Encounter (Signed)
Left message on answering machine to call back.

## 2014-02-13 NOTE — Telephone Encounter (Signed)
Pt called at 1:50 pm during lunch hour.  She demanded to speak to a medical assistant.  After checking the chart for the phone "trail" I called [place] desk, no answer.  I explained that they were seeing patients and some were still on a lunch break.  Pt loudly shouted that she had not even had her lunch that she had been listening to music.  I apologized for her hold time and explained that as a courtesy to our patients, we continued to take phone calls during lunch hours, but that at times, the hold time may be longer due to a reduced staff during that time.  She said she didn't care and that it was annoying to not be able to speak to someone.  Gave the phone message to [employer] / lt

## 2014-02-13 NOTE — Telephone Encounter (Signed)
Patient notified that script has been sent to pharmacy. Patient stated that she will call back and schedule appointment as instructed.

## 2014-02-13 NOTE — Telephone Encounter (Signed)
Please see what details you can get about the phone call waiting period. I thank you for your help with this (and all) patient care.

## 2014-03-21 ENCOUNTER — Telehealth: Payer: Self-pay

## 2014-03-21 NOTE — Telephone Encounter (Signed)
Pt left v/m; pt just got last refill on spironolactone from [facility] and pt request refill for future needs in 3 months. Pt last seen sick visit 08/03/13 amd ? Last hypertension ck ? 02/10/2012.Please advise.

## 2014-03-22 MED ORDER — SPIRONOLACTONE 25 MG PO TABS: 25.0000 mg | ORAL_TABLET | Freq: Two times a day (BID) | ORAL | Status: DC

## 2014-03-22 NOTE — Telephone Encounter (Signed)
Sent.  Schedule CPE for the spring.  Thanks.

## 2014-03-22 NOTE — Telephone Encounter (Signed)
Please schedule appointment as instructed. 

## 2014-03-22 NOTE — Telephone Encounter (Signed)
Left message asking pt to call office  °

## 2014-03-24 LAB — HM MAMMOGRAPHY

## 2014-03-27 NOTE — Telephone Encounter (Signed)
That would actually be the point, to make sure that with all that she has going on that something isn't overlooked.  We can't make her come in, but coming in for a routine check would be a central point of primary care.  Try to schedule CPE or at least a 30 min OV to discuss her care.

## 2014-03-27 NOTE — Telephone Encounter (Signed)
Pt refused  To make cpx appointment she see so many doctors during the year she didn't know why she needed a cpx Please close

## 2014-03-27 NOTE — Telephone Encounter (Signed)
Left message asking pt to call office  °

## 2014-03-28 ENCOUNTER — Encounter: Payer: Self-pay | Admitting: Internal Medicine

## 2014-03-28 ENCOUNTER — Encounter: Payer: Self-pay | Admitting: *Deleted

## 2014-03-28 ENCOUNTER — Ambulatory Visit (INDEPENDENT_AMBULATORY_CARE_PROVIDER_SITE_OTHER): Payer: Medicare PPO | Admitting: Internal Medicine

## 2014-03-28 VITALS — BP 120/68 | HR 76 | Temp 97.9°F

## 2014-03-28 DIAGNOSIS — R300 Dysuria: Secondary | ICD-10-CM

## 2014-03-28 DIAGNOSIS — R42 Dizziness and giddiness: Secondary | ICD-10-CM

## 2014-03-28 LAB — POCT URINALYSIS DIPSTICK
BILIRUBIN UA: NEGATIVE
Blood, UA: NEGATIVE
GLUCOSE UA: NEGATIVE
Ketones, UA: NEGATIVE
LEUKOCYTES UA: NEGATIVE
NITRITE UA: NEGATIVE
Protein, UA: NEGATIVE
Spec Grav, UA: 1.02
Urobilinogen, UA: NEGATIVE
pH, UA: 6

## 2014-03-28 NOTE — Patient Instructions (Signed)

## 2014-03-28 NOTE — Addendum Note (Signed)
Addended by: Lurlean Nanny on: 03/28/2014 04:19 PM   Modules accepted: Orders

## 2014-03-28 NOTE — Telephone Encounter (Signed)
Pt scheduled for 03/30/14

## 2014-03-28 NOTE — Progress Notes (Signed)
HPI  Pt presents to the clinic today with c/o dysuria, low back pain and dizziness. She reports this started 2 days ago. She was on a trip and was not drinking a lot of fluids. She is now concerned that she has a UTI. Last time she had a UTI she was dizzy like this. She describes it a sense of imbalance rather than the room spinning. It is worse with position changes. She has taken some dramamine OTC with good relief. She does have a history of urge incontinence.    Review of Systems  Past Medical History  Diagnosis Date  . Fatigue   . Hyperaldosteronism     serum aldo/renin ration elevated (aldo 23.7, renin <0.15), CT abd 2010 with normal adrenals, dedicated adrenal CT without adrenal adenoma,   . Hypothyroid   . COPD (chronic obstructive pulmonary disease)     quit smoking 1993  . Lung cancer     s/p L lobectomy 2001  . Central retinal vein occlusion of left eye     legally blind in Left eye  . Dysfunction of eustachian tube   . Irritable bowel syndrome   . HLD (hyperlipidemia)   . Urge incontinence   . Goiter, unspecified   . Diverticulosis   . Pulmonary hypertension     echo [facility] 12/2008 with EF >5%, grade II diastolic dysfunction, RSVP >65mmHg, ACE level normal.  f/u echo at [facility] - EF 55-60%, mild MR, mild LAE, PASP 25 + RA (no pulm HTN)  . Collagen vascular disease     uncharacterized.  has seen rheum.  positive RF, positive serologies for Sjogrens.  C-ANCA/P-ANCA neg, ANA  neg, anti-dsDNA neg, anti-SCL70 neg, anti-centromere Ab neg  . Insomnia     Related to HTN and vision loss  . Anxiety     Related to HTN and vision loss  . GERD (gastroesophageal reflux disease)   . Uterine cancer     s/p hysterectomy 2001  . Osteopenia   . Lupus   . HTN (hypertension)     difficult to control, hypertensive urgency hospitalizations, normal urine/plasma metanephrines and catecholamines, renal dopplers without stenosis  . 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 Pneumonia Father   . Alcohol abuse Father   . Hypertension Mother     History   Social History  . Marital Status: Married    Spouse Name: N/A    Number of Children: 4  . Years of Education: N/A   Occupational History  .     Social History Main Topics  . Smoking status: Former Smoker    Types: Cigarettes    Quit date: 07/04/1991  . Smokeless tobacco: Never Used     Comment: Quit in early 1980's.  . Alcohol Use: No  . Drug Use: No  . Sexual Activity: Not on file   Other Topics Concern  . Not on file   Social History Narrative   Married 1962, 4 children, [place]      Regular exercise-yes      Enjoys playing bridge          No Known Allergies  Constitutional: Denies fever, malaise, fatigue, headache or abrupt weight changes.   HEENT: She denies ear pain, runny nose, nasal congestion or sore throat. GU: Pt reports low back pain and pain with urination. Denies burning sensation, blood  in urine, odor or discharge. Skin: Denies redness, rashes, lesions or ulcercations.  Neuro: Pt reports dizziness. Denies difficulty with speech, memory, balance or coordination.  No other specific complaints in a complete review of systems (except as listed in HPI above).    Objective:   Physical Exam  BP 120/68 mmHg  Pulse 76  Temp(Src) 97.9 F (36.6 C) (Oral)  Wt   SpO2 99% Wt Readings from Last 3 Encounters:  08/03/13 136 lb 4 oz (61.803 kg)  06/10/13 137 lb 4 oz (62.256 kg)  05/27/13 137 lb 4 oz (62.256 kg)    General: Appears her stated age, well developed, well nourished in NAD. HEENT: Head normal shape and size, no sinus tenderness noted. Ears: TM gray and intact, normal light reflex, no effusion noted. Cardiovascular: Normal rate and rhythm. S1,S2 noted.  No murmur, rubs or gallops noted.  No carotid bruits noted. Pulmonary/Chest: Normal effort and  positive vesicular breath sounds. No respiratory distress. No wheezes, rales or ronchi noted.  Abdomen: Soft and nontender. Normal bowel sounds, no bruits noted. No distention or masses noted. Liver, spleen and kidneys non palpable. No CVA tenderness.      Assessment & Plan:   Dysuria:  Urinalysis: normal Drink plenty of fluids  Dizziness:  ? Orthostatic vs BPPV She refuses orthostatics today Advised her to continue dramamine, push fluids and follow up with PCP  RTC as needed or if symptoms persist.

## 2014-03-28 NOTE — Telephone Encounter (Signed)
Patient is coming in for an acute visit with [name] today and I will try to approach the patient with the comment below from Dr. Damita Dunnings.  I have printed a letter to give the patient if she still refuses.  Otherwise, the letter will not be given to the patient.

## 2014-03-30 ENCOUNTER — Encounter: Payer: Self-pay | Admitting: Family Medicine

## 2014-03-30 ENCOUNTER — Ambulatory Visit (INDEPENDENT_AMBULATORY_CARE_PROVIDER_SITE_OTHER): Payer: Medicare PPO | Admitting: Family Medicine

## 2014-03-30 VITALS — BP 140/66 | HR 62 | Temp 98.6°F | Wt 137.5 lb

## 2014-03-30 DIAGNOSIS — Z1239 Encounter for other screening for malignant neoplasm of breast: Secondary | ICD-10-CM

## 2014-03-30 DIAGNOSIS — N183 Chronic kidney disease, stage 3 unspecified: Secondary | ICD-10-CM

## 2014-03-30 DIAGNOSIS — E038 Other specified hypothyroidism: Secondary | ICD-10-CM

## 2014-03-30 DIAGNOSIS — N189 Chronic kidney disease, unspecified: Secondary | ICD-10-CM

## 2014-03-30 DIAGNOSIS — Z1211 Encounter for screening for malignant neoplasm of colon: Secondary | ICD-10-CM

## 2014-03-30 DIAGNOSIS — R911 Solitary pulmonary nodule: Secondary | ICD-10-CM

## 2014-03-30 DIAGNOSIS — R42 Dizziness and giddiness: Secondary | ICD-10-CM

## 2014-03-30 DIAGNOSIS — E039 Hypothyroidism, unspecified: Secondary | ICD-10-CM

## 2014-03-30 NOTE — Progress Notes (Signed)
Pre visit review using our clinic review tool, if applicable. No additional management support is needed unless otherwise documented below in the visit note.  Prev with sensation of imbalance vs room spinning.  No syncope. Couldn't turn over in the bed w/o the room spinning.  The first day was the worst. Worse sx with position changes.  Much better today.  No focal neuro changes.    CKD.  She has f/u with renal pending, Dr. Holley Raring.   Due for routine labs.   Hypothyroidism.  No new sx, compliant with meds.  No ADE.  Due for labs.   She has has f/u with Dr. Jefm Bryant with rheum and will see him again later in 2016.   She has seen Dr. Oliva Bustard about her prev noted pulmonary nodule.  She wouldn't want want any treatment (ie surgery/chemo/radiation) should it be cancerous.   I asked her to keep her f/u with Dr. Oliva Bustard and notify him of her preferences.   She has no intention of getting mammogram/colon cancer screening as she wouldn't want treatment should an abnormality be found.  I clarified this with her and she has no uncertainty about her decision.  I will turn off reminders about these in the EMR and she supports that.    PMH and SH reviewed  ROS: See HPI, otherwise noncontributory.  Meds, vitals, and allergies reviewed.   GEN: nad, alert and oriented HEENT: mucous membranes moist, tm wnl, OP exam wnl NECK: supple w/o LA, no tmg CV: rrr.  PULM: ctab, no inc wob ABD: soft, +bs EXT: no edema SKIN: no acute rash CN 2-12 wnl B, S/S wnl x4.  No sx on eye tracking but she can get vertiginous with head turning

## 2014-03-30 NOTE — Patient Instructions (Signed)
It looks like you had BPV.  It should get better, but it can come back episodically.   Go to the lab on the way out.  We'll contact you with your lab report. We'll send a copy of the labs to renal.

## 2014-03-31 ENCOUNTER — Encounter: Payer: Self-pay | Admitting: Family Medicine

## 2014-03-31 DIAGNOSIS — R911 Solitary pulmonary nodule: Secondary | ICD-10-CM | POA: Insufficient documentation

## 2014-03-31 DIAGNOSIS — Z1211 Encounter for screening for malignant neoplasm of colon: Secondary | ICD-10-CM | POA: Insufficient documentation

## 2014-03-31 LAB — CBC WITH DIFFERENTIAL/PLATELET
BASOS PCT: 1.3 % (ref 0.0–3.0)
Basophils Absolute: 0.1 10*3/uL (ref 0.0–0.1)
EOS ABS: 0.3 10*3/uL (ref 0.0–0.7)
Eosinophils Relative: 5.9 % — ABNORMAL HIGH (ref 0.0–5.0)
HCT: 35.8 % — ABNORMAL LOW (ref 36.0–46.0)
HEMOGLOBIN: 11.7 g/dL — AB (ref 12.0–15.0)
LYMPHS PCT: 20.2 % (ref 12.0–46.0)
Lymphs Abs: 1.2 10*3/uL (ref 0.7–4.0)
MCHC: 32.7 g/dL (ref 30.0–36.0)
MCV: 98 fl (ref 78.0–100.0)
Monocytes Absolute: 0.3 10*3/uL (ref 0.1–1.0)
Monocytes Relative: 6 % (ref 3.0–12.0)
Neutro Abs: 3.9 10*3/uL (ref 1.4–7.7)
Neutrophils Relative %: 66.6 % (ref 43.0–77.0)
Platelets: 280 10*3/uL (ref 150.0–400.0)
RBC: 3.65 Mil/uL — AB (ref 3.87–5.11)
RDW: 12.8 % (ref 11.5–15.5)
WBC: 5.8 10*3/uL (ref 4.0–10.5)

## 2014-03-31 LAB — COMPREHENSIVE METABOLIC PANEL
ALBUMIN: 4.1 g/dL (ref 3.5–5.2)
ALT: 18 U/L (ref 0–35)
AST: 20 U/L (ref 0–37)
Alkaline Phosphatase: 73 U/L (ref 39–117)
BUN: 27 mg/dL — ABNORMAL HIGH (ref 6–23)
CO2: 27 meq/L (ref 19–32)
Calcium: 9.3 mg/dL (ref 8.4–10.5)
Chloride: 105 mEq/L (ref 96–112)
Creatinine, Ser: 1.5 mg/dL — ABNORMAL HIGH (ref 0.4–1.2)
GFR: 36.67 mL/min — AB (ref >60.00)
Glucose, Bld: 87 mg/dL (ref 70–99)
Potassium: 4.2 mEq/L (ref 3.5–5.1)
Sodium: 139 mEq/L (ref 135–145)
TOTAL PROTEIN: 7.4 g/dL (ref 6.0–8.3)
Total Bilirubin: 0.4 mg/dL (ref 0.2–1.2)

## 2014-03-31 LAB — MICROALBUMIN / CREATININE URINE RATIO
CREATININE, U: 81.9 mg/dL
MICROALB/CREAT RATIO: 2 mg/g (ref 0.0–30.0)
Microalb, Ur: 1.6 mg/dL (ref 0.0–1.9)

## 2014-03-31 LAB — TSH: TSH: 1.28 u[IU]/mL (ref 0.35–4.50)

## 2014-03-31 NOTE — Assessment & Plan Note (Signed)
Prev CT reviewed. She has seen Dr. Oliva Bustard about her prev noted pulmonary nodule.  She wouldn't want want any treatment (ie surgery/chemo/radiation) should it be cancerous.  I asked her to keep her f/u with Dr. Oliva Bustard and notify him of her preferences.

## 2014-03-31 NOTE — Assessment & Plan Note (Signed)
See notes on labs.  Per renal.  BP controlled today.

## 2014-03-31 NOTE — Assessment & Plan Note (Signed)
Sx improved now, reproduced with head turning.  No other focal sx.   D/w pt about anatomy and bedside exercises. She can use dramamine prn.  F/u prn.

## 2014-03-31 NOTE — Assessment & Plan Note (Signed)
See notes on labs.  No change in meds today.  She agrees. >45 minutes spent in face to face time with patient, >50% spent in counselling or coordination of care

## 2014-04-06 ENCOUNTER — Ambulatory Visit: Payer: Self-pay | Admitting: Oncology

## 2014-04-10 ENCOUNTER — Ambulatory Visit: Payer: Self-pay | Admitting: Oncology

## 2014-04-24 ENCOUNTER — Ambulatory Visit: Payer: Self-pay | Admitting: Oncology

## 2014-05-12 ENCOUNTER — Telehealth: Payer: Self-pay

## 2014-05-12 NOTE — Telephone Encounter (Signed)
Pt requesting refills spironolactone and lorazepam to [facility]; pt should have available refills on both meds and pt will contact [facility]; pt will cb or have [facility] contact office if problem.

## 2014-05-25 ENCOUNTER — Telehealth: Payer: Self-pay

## 2014-05-25 DIAGNOSIS — H43812 Vitreous degeneration, left eye: Secondary | ICD-10-CM | POA: Diagnosis not present

## 2014-05-25 DIAGNOSIS — H34812 Central retinal vein occlusion, left eye: Secondary | ICD-10-CM | POA: Diagnosis not present

## 2014-05-25 DIAGNOSIS — H35352 Cystoid macular degeneration, left eye: Secondary | ICD-10-CM | POA: Diagnosis not present

## 2014-05-25 DIAGNOSIS — H4312 Vitreous hemorrhage, left eye: Secondary | ICD-10-CM | POA: Diagnosis not present

## 2014-05-25 NOTE — Telephone Encounter (Signed)
Pt left v/m requesting ranitidine to be changed from tablets to capsules; pt does not like the tablets and tablets look similar to other meds pt takes and makes it difficult for pt to take med when traveling. Pt request capsules sent to [facility] mail order ASAP. Please advise.

## 2014-05-26 MED ORDER — RANITIDINE HCL 150 MG PO CAPS: 150.0000 mg | ORAL_CAPSULE | Freq: Every day | ORAL | Status: DC

## 2014-05-26 NOTE — Telephone Encounter (Signed)
Sent. Thanks.   

## 2014-06-19 ENCOUNTER — Other Ambulatory Visit: Payer: Self-pay

## 2014-06-19 MED ORDER — LEVOTHYROXINE SODIUM 88 MCG PO TABS: 88.0000 ug | ORAL_TABLET | Freq: Every day | ORAL | Status: DC

## 2014-06-19 NOTE — Telephone Encounter (Signed)
Pt left v/m requesting refill synthroid to [facility]; pt does not want cb if refill done. Refills done as requested.

## 2014-07-07 ENCOUNTER — Other Ambulatory Visit: Payer: Self-pay | Admitting: Oncology

## 2014-07-07 DIAGNOSIS — R911 Solitary pulmonary nodule: Secondary | ICD-10-CM

## 2014-07-20 ENCOUNTER — Ambulatory Visit: Payer: Medicare PPO | Admitting: Podiatry

## 2014-07-28 DIAGNOSIS — N183 Chronic kidney disease, stage 3 (moderate): Secondary | ICD-10-CM | POA: Diagnosis not present

## 2014-07-28 DIAGNOSIS — N119 Chronic tubulo-interstitial nephritis, unspecified: Secondary | ICD-10-CM | POA: Diagnosis not present

## 2014-07-28 DIAGNOSIS — I10 Essential (primary) hypertension: Secondary | ICD-10-CM | POA: Diagnosis not present

## 2014-08-03 ENCOUNTER — Other Ambulatory Visit: Payer: Self-pay

## 2014-08-03 MED ORDER — LORAZEPAM 1 MG PO TABS: 0.5000 mg | ORAL_TABLET | Freq: Every day | ORAL | Status: DC

## 2014-08-03 NOTE — Telephone Encounter (Signed)
Pt left v/m requesting new rx of lorazepam to [facility]. Last printed 01/11/14; last seen 03/30/2014. Thought pt would have one refill left but lorazepam can only be refilled for 6 months period, not a year.Please advise.

## 2014-08-03 NOTE — Telephone Encounter (Signed)
Printed, please send.  Thanks.

## 2014-08-04 NOTE — Telephone Encounter (Signed)
Rx faxed to [facility] 

## 2014-08-08 ENCOUNTER — Ambulatory Visit: Payer: Medicare PPO | Admitting: Podiatry

## 2014-08-24 ENCOUNTER — Other Ambulatory Visit: Payer: Self-pay | Admitting: *Deleted

## 2014-08-24 DIAGNOSIS — R911 Solitary pulmonary nodule: Secondary | ICD-10-CM

## 2014-09-05 ENCOUNTER — Telehealth: Payer: Self-pay | Admitting: *Deleted

## 2014-09-05 NOTE — Telephone Encounter (Signed)
Lmom to call our office. Time to schedule a carotid doppler (1 yr f/u).

## 2014-09-05 NOTE — Telephone Encounter (Signed)
Pt states she does not do this every year, will not need apt

## 2014-09-07 ENCOUNTER — Ambulatory Visit: Payer: Medicare PPO | Admitting: Podiatry

## 2014-09-18 ENCOUNTER — Ambulatory Visit
Admission: RE | Admit: 2014-09-18 | Discharge: 2014-09-18 | Disposition: A | Discharge status: Home / Self Care | Payer: Medicare PPO | Source: Ambulatory Visit | Attending: Oncology | Admitting: Oncology

## 2014-09-18 DIAGNOSIS — R918 Other nonspecific abnormal finding of lung field: Secondary | ICD-10-CM | POA: Diagnosis not present

## 2014-09-18 DIAGNOSIS — Z85118 Personal history of other malignant neoplasm of bronchus and lung: Secondary | ICD-10-CM | POA: Diagnosis not present

## 2014-09-18 DIAGNOSIS — J439 Emphysema, unspecified: Secondary | ICD-10-CM | POA: Diagnosis not present

## 2014-09-18 DIAGNOSIS — Z8542 Personal history of malignant neoplasm of other parts of uterus: Secondary | ICD-10-CM | POA: Insufficient documentation

## 2014-09-18 DIAGNOSIS — R911 Solitary pulmonary nodule: Secondary | ICD-10-CM | POA: Diagnosis not present

## 2014-09-18 DIAGNOSIS — Z85850 Personal history of malignant neoplasm of thyroid: Secondary | ICD-10-CM | POA: Diagnosis not present

## 2014-09-19 ENCOUNTER — Inpatient Hospital Stay: Payer: Medicare PPO | Attending: Oncology | Admitting: Oncology

## 2014-09-19 VITALS — BP 126/65 | HR 58 | Temp 95.9°F | Wt 138.0 lb

## 2014-09-19 DIAGNOSIS — R918 Other nonspecific abnormal finding of lung field: Secondary | ICD-10-CM | POA: Diagnosis not present

## 2014-09-19 DIAGNOSIS — C55 Malignant neoplasm of uterus, part unspecified: Secondary | ICD-10-CM

## 2014-09-19 DIAGNOSIS — J449 Chronic obstructive pulmonary disease, unspecified: Secondary | ICD-10-CM | POA: Diagnosis not present

## 2014-09-19 DIAGNOSIS — R5383 Other fatigue: Secondary | ICD-10-CM

## 2014-09-19 DIAGNOSIS — Z7982 Long term (current) use of aspirin: Secondary | ICD-10-CM | POA: Insufficient documentation

## 2014-09-19 DIAGNOSIS — Z85850 Personal history of malignant neoplasm of thyroid: Secondary | ICD-10-CM | POA: Diagnosis not present

## 2014-09-19 DIAGNOSIS — R0602 Shortness of breath: Secondary | ICD-10-CM

## 2014-09-19 DIAGNOSIS — Z85118 Personal history of other malignant neoplasm of bronchus and lung: Secondary | ICD-10-CM

## 2014-09-19 DIAGNOSIS — Z87891 Personal history of nicotine dependence: Secondary | ICD-10-CM | POA: Insufficient documentation

## 2014-09-19 DIAGNOSIS — N189 Chronic kidney disease, unspecified: Secondary | ICD-10-CM | POA: Diagnosis not present

## 2014-09-19 DIAGNOSIS — Z79899 Other long term (current) drug therapy: Secondary | ICD-10-CM

## 2014-09-19 DIAGNOSIS — Z8542 Personal history of malignant neoplasm of other parts of uterus: Secondary | ICD-10-CM | POA: Insufficient documentation

## 2014-09-19 DIAGNOSIS — I272 Other secondary pulmonary hypertension: Secondary | ICD-10-CM | POA: Diagnosis not present

## 2014-09-19 DIAGNOSIS — R911 Solitary pulmonary nodule: Secondary | ICD-10-CM

## 2014-09-19 DIAGNOSIS — I129 Hypertensive chronic kidney disease with stage 1 through stage 4 chronic kidney disease, or unspecified chronic kidney disease: Secondary | ICD-10-CM

## 2014-09-19 DIAGNOSIS — E785 Hyperlipidemia, unspecified: Secondary | ICD-10-CM | POA: Diagnosis not present

## 2014-09-19 NOTE — Progress Notes (Signed)
Patient does have living will. Never smoked. 

## 2014-09-22 ENCOUNTER — Encounter: Payer: Self-pay | Admitting: Oncology

## 2014-09-22 DIAGNOSIS — C55 Malignant neoplasm of uterus, part unspecified: Secondary | ICD-10-CM

## 2014-09-22 HISTORY — DX: Malignant neoplasm of uterus, part unspecified: C55

## 2014-09-22 NOTE — Progress Notes (Signed)
[place] @ [facility] [phone]  Fax:([phone]) [place]: 09-04-1940  MR#: [id number]  JYN#:[id number]  Patient Care Team: Tonia Ghent, MD as PCP - General Munsoor Holley Raring, MD (Internal Medicine)  CHIEF COMPLAINT:  Chief Complaint  Patient presents with  . 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 Recently patient has developed congestion shortness of breath off and on since last January.  Treated multiple times with antibiotics recent chest x-ray was showing multiple abnormal lady.  I do not have that x-ray available. Patient also has mild renal insufficiency previous biopsy of the kidney being followed by nephrologist on a regular basis Patient decided to get followup with me in because of persistent lung problem and previous history of metastases to the lung.  Appetite has been stable.patient has regular mammogram and colonoscopy done April 10, 2014 Patient is here for ongoing evaluation and treatment consideration.  Had a repeat CT scan shows slight increase in the left upper lobe nodule.  CT scan has been reviewed by me independently as well as reviewed with the patient.  Patient remains asymptomatic.  Had a mammogram which was reported to be negative.  Getting regular checkup don  INTERVAL HISTORY: 74 year old lady came today further follow-up.  Had a repeat CT scan.  Patient  is being followed for lung nodule.  No shortness of breath.  Appetite remains stable. No Cough or hemoptysis or chest pain  REVIEW OF SYSTEMS:   GENERAL:  Feels good.  Active.  No fevers, sweats or weight loss. PERFORMANCE STATUS (ECOG):  0 HEENT:  No visual changes, runny nose, sore throat, mouth sores or tenderness. Lungs: No shortness of breath or cough.  No hemoptysis. Cardiac:  No chest pain, palpitations, orthopnea, or PND. GI:  No nausea, vomiting, diarrhea, constipation, melena or hematochezia. GU:  No urgency, frequency, dysuria, or hematuria. Musculoskeletal:  No back pain.  No joint pain.  No muscle tenderness. Extremities:  No pain or swelling. Skin:  No rashes or skin changes. Neuro:  No headache, numbness or weakness, balance or coordination issues. Endocrine:  No diabetes, thyroid issues, hot flashes or night sweats. Psych:  No mood changes, depression or anxiety. Pain:  No focal pain. Review of systems:  All other systems reviewed and found to be negative. As per HPI. Otherwise, a complete review of systems is negatve.  PAST MEDICAL HISTORY: Past Medical History  Diagnosis Date  . Fatigue   . Hyperaldosteronism     serum aldo/renin ration elevated (aldo 23.7, renin <0.15), CT abd 2010 with normal adrenals, dedicated adrenal CT without adrenal adenoma,   . Hypothyroid   . COPD (chronic obstructive pulmonary disease)     quit smoking 1993  . Lung cancer     s/p L lobectomy 2001  . Central retinal vein occlusion of left eye     legally blind in Left eye  .  Dysfunction of eustachian tube   . Irritable bowel syndrome   . HLD (hyperlipidemia)   . Urge incontinence   . Goiter, unspecified   . Diverticulosis   . Pulmonary hypertension     echo [facility] 12/2008 with EF >5%, grade II diastolic dysfunction, RSVP >20mHg, ACE level normal.  f/u echo at [facility]- EF 55-60%, mild MR, mild LAE, PASP 25 + RA (no pulm HTN)  . Collagen vascular disease     uncharacterized.  has  seen rheum.  positive RF, positive serologies for Sjogrens.  C-ANCA/P-ANCA neg, ANA  neg, anti-dsDNA neg, anti-SCL70 neg, anti-centromere Ab neg  . Insomnia     Related to HTN and vision loss  . Anxiety     Related to HTN and vision loss  . GERD (gastroesophageal reflux disease)   . Uterine cancer     s/p hysterectomy 2001  . Osteopenia   . Lupus   . HTN (hypertension)     difficult to control, hypertensive urgency hospitalizations, normal urine/plasma metanephrines and catecholamines, renal dopplers without stenosis  . 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 Leiomyosarcoma of uterus 09/22/2014    PAST SURGICAL HISTORY: Past Surgical History  Procedure Laterality Date  . Lobectomy  04/1999    left lung  . Abdominal hysterectomy  06/1999  . Cholecystectomy  1982  . Tubal ligation  1975  . Appendectomy  1958  . Ct chest  2007    enlarged thyroid with goiter  . Ct abd w & pelvis wo cm  2010    neg except diverticulosis  . Thyroidectomy      FAMILY HISTORY Family History  Problem Relation Age of Onset  . Pneumonia Father   . Alcohol abuse Father   . Hypertension Mother     ADVANCED DIRECTIVES:  No flowsheet data found.  HEALTH MAINTENANCE: History  Substance Use Topics  . Smoking status: Former Smoker    Types: Cigarettes    Quit date: 07/04/1991  . Smokeless tobacco: Never Used     Comment: Quit in early 1980's.  . Alcohol Use: No      Allergies  Allergen Reactions  . Lactase Diarrhea  . Valsartan Other (See Comments)    REACTION: sores in mouth REACTION: sores in mouth    Current Outpatient Prescriptions  Medication Sig Dispense Refill  . aspirin 81 MG tablet Take 81 mg by mouth daily.    .Marland KitchenBYSTOLIC 5 MG tablet TAKE 1 TABLET TWICE DAILY 180 tablet 3  . calcium carbonate (TUMS - DOSED IN MG ELEMENTAL CALCIUM) 500 MG chewable tablet Chew 2 tablets by mouth daily.    .Marland KitchenCALCIUM-VITAMIN D PO Take by mouth daily.      . enalapril (VASOTEC) 2.5 MG tablet Take 1 tablet (2.5 mg total) by mouth daily.    . hydroxychloroquine (PLAQUENIL) 200 MG tablet Take by mouth.    . levothyroxine (SYNTHROID, LEVOTHROID) 88 MCG tablet Take 1 tablet (88 mcg total) by mouth daily before breakfast. 90 tablet 1  . LORazepam (ATIVAN) 1 MG tablet Take 0.5 tablets (0.5 mg total) by mouth at bedtime. 90 tablet 1  . Multiple Vitamin (MULTIVITAMIN) tablet Take 1 tablet by mouth daily.    .Marland KitchenNIFEdipine (NIFEDICAL XL) 60 MG 24 hr tablet TAKE 1 TABLET EVERY EVENING 90 tablet 1  . ranitidine (ZANTAC) 150 MG capsule Take 1 capsule (150 mg total) by mouth daily. 90 capsule 3  . spironolactone (ALDACTONE)  25 MG tablet Take 1 tablet (25 mg total) by mouth 2 (two) times daily. 180 tablet 3  . vitamin C (ASCORBIC ACID) 500 MG tablet Take 500 mg by mouth daily.    Marland Kitchen albuterol (PROVENTIL HFA;VENTOLIN HFA) 108 (90 BASE) MCG/ACT inhaler Inhale 2 puffs into the lungs every 6 (six) hours as needed for wheezing or shortness of breath. (Patient not taking: Reported on 09/19/2014) 1 Inhaler 1  . fluticasone (CUTIVATE) 0.05 % cream      No current facility-administered medications for this visit.    OBJECTIVE:  Filed Vitals:   09/19/14 1039  BP: 126/65  Pulse: 58  Temp: 95.9 F (35.5 C)     Body mass index is 22.99 kg/(m^2).    ECOG FS:0 - Asymptomatic  PHYSICAL EXAM: GENERAL:  Well developed, well nourished, sitting comfortably in the exam room in no acute distress. MENTAL STATUS:  Alert and oriented to person, place and time. HEAD: .  Normocephalic, atraumatic, face symmetric, no Cushingoid features. EYES:  .  Pupils equal round and reactive to light and accomodation.  No conjunctivitis or scleral icterus. ENT:  Oropharynx clear without lesion.  Tongue normal. Mucous membranes moist.  RESPIRATORY:  Clear to auscultation without rales, wheezes or rhonchi. CARDIOVASCULAR:  Regular rate and rhythm without murmur, rub or gallop.  ABDOMEN:  Soft,  non-tender, with active bowel sounds, and no hepatosplenomegaly.  No masses. BACK:  No CVA tenderness.  No tenderness on percussion of the back or rib cage. SKIN:  No rashes, ulcers or lesions. EXTREMITIES: No edema, no skin discoloration or tenderness.  No palpable cords. LYMPH NODES: No palpable cervical, supraclavicular, axillary or inguinal adenopathy  NEUROLOGICAL: Unremarkable. PSYCH:  Appropriate.   LAB RESULTS:  No visits with results within 2 Day(s) from this visit. Latest known visit with results is:  Office Visit on 03/30/2014  Component Date Value Ref Range Status  . Sodium 03/30/2014 139  135 - 145 mEq/L Final  . Potassium 03/30/2014 4.2  3.5 - 5.1 mEq/L Final  . Chloride 03/30/2014 105  96 - 112 mEq/L Final  . CO2 03/30/2014 27  19 - 32 mEq/L Final  . Glucose, Bld 03/30/2014 87  70 - 99 mg/dL Final  . BUN 03/30/2014 27* 6 - 23 mg/dL Final  . Creatinine, Ser 03/30/2014 1.5* 0.4 - 1.2 mg/dL Final  . Total Bilirubin 03/30/2014 0.4  0.2 - 1.2 mg/dL Final  . Alkaline Phosphatase 03/30/2014 73  39 - 117 U/L Final  . AST 03/30/2014 20  0 - 37 U/L Final  . ALT 03/30/2014 18  0 - 35 U/L Final  . Total Protein 03/30/2014 7.4  6.0 - 8.3 g/dL Final  . Albumin 03/30/2014 4.1  3.5 - 5.2 g/dL Final  . Calcium 03/30/2014 9.3  8.4 - 10.5 mg/dL Final  . GFR 03/30/2014 36.67* >60.00 mL/min Final  . WBC 03/30/2014 5.8  4.0 - 10.5 K/uL Final  . RBC 03/30/2014 3.65* 3.87 - 5.11 Mil/uL Final  . Hemoglobin 03/30/2014 11.7* 12.0 - 15.0 g/dL Final  . HCT 03/30/2014 35.8* 36.0 - 46.0 % Final  . MCV 03/30/2014 98.0  78.0 - 100.0 fl Final  . MCHC 03/30/2014 32.7  30.0 - 36.0 g/dL Final  . RDW 03/30/2014 12.8  11.5 - 15.5 % Final  . Platelets 03/30/2014 280.0  150.0 - 400.0 K/uL Final  . Neutrophils Relative % 03/30/2014 66.6  43.0 - 77.0 % Final  . Lymphocytes Relative 03/30/2014 20.2  12.0 - 46.0 % Final  .  Monocytes Relative 03/30/2014 6.0  3.0 - 12.0 % Final  . Eosinophils Relative  03/30/2014 5.9* 0.0 - 5.0 % Final  . Basophils Relative 03/30/2014 1.3  0.0 - 3.0 % Final  . Neutro Abs 03/30/2014 3.9  1.4 - 7.7 K/uL Final  . Lymphs Abs 03/30/2014 1.2  0.7 - 4.0 K/uL Final  . Monocytes Absolute 03/30/2014 0.3  0.1 - 1.0 K/uL Final  . Eosinophils Absolute 03/30/2014 0.3  0.0 - 0.7 K/uL Final  . Basophils Absolute 03/30/2014 0.1  0.0 - 0.1 K/uL Final  . TSH 03/30/2014 1.28  0.35 - 4.50 uIU/mL Final  . Microalb, Ur 03/30/2014 1.6  0.0 - 1.9 mg/dL Final  . Creatinine,U 03/30/2014 81.9   Final  . Microalb Creat Ratio 03/30/2014 2.0  0.0 - 30.0 mg/g Final      STUDIES: Ct Chest Wo Contrast  09/18/2014   CLINICAL DATA:  Follow up pulmonary nodule. History of lung cancer 11 years ago with partial lobectomy. History of thyroid cancer 2 years ago and remote uterine cancer. Subsequent encounter.  EXAM: CT CHEST WITHOUT CONTRAST  TECHNIQUE: Multidetector CT imaging of the chest was performed following the standard protocol without IV contrast.  COMPARISON:  Chest CT 04/06/2014, 10/04/2013 and 01/22/2010.  FINDINGS: Mediastinum/Nodes: There are no enlarged mediastinal, hilar or axillary lymph nodes.Right paratracheal surgical clips are noted. Previous thyroidectomy. The esophagus appears unchanged. The heart size is normal. There is no pericardial effusion.There is moderate atherosclerosis of the aorta, great vessels and coronary arteries.  Lungs/Pleura: There is no pleural effusion. Emphysema, scattered scarring and postsurgical changes in the right upper lobe and left lung base are again noted. There are multiple scattered pulmonary nodules bilaterally which are grossly stable from the recent studies. The largest nodules are in the left upper lobe, measuring 6 mm on image number 15 and in the left lower lobe, measuring 12 mm on image number 42. There are ill-defined ground-glass densities within the superior segment of the left lower lobe on images 24 through 26 which are grossly stable  from the recent studies. These have enlarged from 2011.  Upper abdomen: Stable subcapsular cyst in the right hepatic lobe. No suspicious findings.  Musculoskeletal/Chest wall: There is no chest wall mass or suspicious osseous finding. Scattered sclerotic lesions are stable, likely bone islands.  IMPRESSION: 1. Compared with the 2 most recent studies done over the last year, no significant changes are seen in the pulmonary nodules and ground-glass densities as described. 2. The left upper lobe nodule and the ground-glass densities in the superior segment of the left lower lobe have enlarged from 2011. Continued CT follow-up recommended in 1 year. 3. No adenopathy or acute findings. 4. Moderate atherosclerosis.   Electronically Signed   By: Richardean Sale M.D.   On: 09/18/2014 14:10    ASSESSMENT: Leiomyo sarcoma of uterus with previous history of lung metastases status post resection. CT scan has been reviewed independently. There is no significant increase in the size of lung nodule. History of thyroid cancer Multiple other medical illnesses  MEDICAL DECISION MAKING:  All lab data has been reviewed. Mild anemia. Records from outside institution has been reviewed CT scan has been reviewed independently and reviewed with the patient No further intervention but repeat CT scan in 6 months for follow-up regarding lung nodule  Patient expressed understanding and was in agreement with this plan. She also understands that She can call clinic at any time with any questions, concerns, or complaints.    No  matching staging information was found for the patient.  Forest Gleason, MD   09/22/2014 8:26 PM

## 2014-10-02 ENCOUNTER — Other Ambulatory Visit: Payer: Medicare PPO

## 2014-10-03 ENCOUNTER — Ambulatory Visit: Payer: Medicare PPO

## 2014-10-04 ENCOUNTER — Ambulatory Visit: Payer: Medicare PPO | Admitting: Oncology

## 2014-10-05 ENCOUNTER — Other Ambulatory Visit: Payer: Self-pay

## 2014-10-05 MED ORDER — NIFEDIPINE ER OSMOTIC RELEASE 60 MG PO TB24: ORAL_TABLET | ORAL | Status: DC

## 2014-10-05 NOTE — Telephone Encounter (Signed)
Pt left v/m requesting refill nifedipine 60 mg to [facility]; pt did not want cb. Refill done per protocol.

## 2014-10-09 ENCOUNTER — Encounter: Payer: Self-pay | Admitting: *Deleted

## 2014-10-31 NOTE — Telephone Encounter (Signed)
Pt left v/m requesting refill lorazepam to [facility]. Spoke with Mr Blyth and he thinks pt just got refill but she is planning ahead for next refill. Pt to cb 2-3 weeks prior to needing med and request refill if that is the case. Mr Tony voiced understanding.

## 2014-11-17 ENCOUNTER — Other Ambulatory Visit: Payer: Self-pay | Admitting: Family Medicine

## 2014-11-17 NOTE — Telephone Encounter (Signed)
Pt called to ck on bystolic refill; advised already done.

## 2014-11-23 NOTE — Telephone Encounter (Signed)
Pt used her old rx # to get [facility] refill on bystolic; advised pt new rx was sent in on 11/17/14. Pt will ck with pharmacy.

## 2014-11-24 DIAGNOSIS — N183 Chronic kidney disease, stage 3 (moderate): Secondary | ICD-10-CM | POA: Diagnosis not present

## 2014-11-24 DIAGNOSIS — M329 Systemic lupus erythematosus, unspecified: Secondary | ICD-10-CM | POA: Diagnosis not present

## 2014-11-24 DIAGNOSIS — I10 Essential (primary) hypertension: Secondary | ICD-10-CM | POA: Diagnosis not present

## 2014-11-24 DIAGNOSIS — N119 Chronic tubulo-interstitial nephritis, unspecified: Secondary | ICD-10-CM | POA: Diagnosis not present

## 2014-11-24 DIAGNOSIS — D631 Anemia in chronic kidney disease: Secondary | ICD-10-CM | POA: Diagnosis not present

## 2014-11-25 DIAGNOSIS — T07 Unspecified multiple injuries: Secondary | ICD-10-CM | POA: Diagnosis not present

## 2014-11-25 DIAGNOSIS — R21 Rash and other nonspecific skin eruption: Secondary | ICD-10-CM | POA: Diagnosis not present

## 2014-12-08 DIAGNOSIS — I10 Essential (primary) hypertension: Secondary | ICD-10-CM | POA: Diagnosis not present

## 2014-12-08 DIAGNOSIS — N183 Chronic kidney disease, stage 3 (moderate): Secondary | ICD-10-CM | POA: Diagnosis not present

## 2014-12-11 ENCOUNTER — Other Ambulatory Visit: Payer: Self-pay | Admitting: *Deleted

## 2014-12-11 MED ORDER — LEVOTHYROXINE SODIUM 88 MCG PO TABS: 88.0000 ug | ORAL_TABLET | Freq: Every day | ORAL | Status: DC

## 2014-12-12 ENCOUNTER — Ambulatory Visit: Payer: Medicare PPO | Admitting: Family Medicine

## 2015-01-02 ENCOUNTER — Telehealth: Payer: Self-pay | Admitting: *Deleted

## 2015-01-02 NOTE — Telephone Encounter (Signed)
Lm with spouse to call our office to schedule a carotid u/s (1 yr u/s).

## 2015-01-03 ENCOUNTER — Telehealth: Payer: Self-pay

## 2015-01-03 DIAGNOSIS — I779 Disorder of arteries and arterioles, unspecified: Secondary | ICD-10-CM

## 2015-01-03 DIAGNOSIS — Z85850 Personal history of malignant neoplasm of thyroid: Secondary | ICD-10-CM

## 2015-01-03 DIAGNOSIS — I10 Essential (primary) hypertension: Secondary | ICD-10-CM

## 2015-01-03 DIAGNOSIS — I739 Peripheral vascular disease, unspecified: Secondary | ICD-10-CM

## 2015-01-03 DIAGNOSIS — N189 Chronic kidney disease, unspecified: Secondary | ICD-10-CM

## 2015-01-03 NOTE — Telephone Encounter (Signed)
Pt called back and was confused as to what we called her, when I explained she was upset and did not want to do this.

## 2015-01-03 NOTE — Telephone Encounter (Signed)
PLEASE NOTE: All timestamps contained within this report are represented as [place] Standard Time. CONFIDENTIALTY NOTICE: This fax transmission is intended only for the addressee. It contains information that is legally privileged, confidential or otherwise protected from use or disclosure. If you are not the intended recipient, you are strictly prohibited from reviewing, disclosing, copying using or disseminating any of this information or taking any action in reliance on or regarding this information. If you have received this fax in error, please notify us immediately by telephone so that we can arrange for its return to [place]. Phone: [phone], Toll-Free: [phone], Fax: [phone] Page: 1 of 1 Call Id: [id number] [place] Patient Name: Margaret Black Gender: Female DOB: [birth year] Age: 75 Y 4 M 13 D Return Phone Number: [phone] (Primary) Address: City/State/Zip: [place] Client [place] [facility] - Client Client Site [place] [facility] Renford Dills Contact Type Call Caller Name [place] Phone Number [phone] Relationship To Patient Self Is this call to report lab results? No Call Type General Information Initial Comment Caller states, [facility] wants her to schedule a yearly carotid testing. She wants to make sure this is needed every year. She also wants a thyroid ultrasound scheduled. -- she wants a call back from either her Dr or the nurse to verify this info, please. General Information Type Message Only Nurse Assessment Guidelines Guideline Title Affirmed Question Affirmed Notes Nurse Date/Time (Eastern Time) Disp. Time Eilene Ghazi Time) Disposition Final User 01/03/2015 9:18:40 AM General Information Provided Yes Eather Colas After Care Instructions Given Call Event Type User Date / Time Description

## 2015-01-04 NOTE — Telephone Encounter (Signed)
I would repeat the carotid u/s to make sure it hasn't gotten worse from prev.  I put in the order for the thyroid ultrasound.   Reasonable to recheck TSH and Tg concurrently.  All orders for the thyroid tests are in.

## 2015-01-05 ENCOUNTER — Other Ambulatory Visit: Payer: Self-pay | Admitting: Family Medicine

## 2015-01-05 DIAGNOSIS — I779 Disorder of arteries and arterioles, unspecified: Secondary | ICD-10-CM

## 2015-01-05 DIAGNOSIS — I739 Peripheral vascular disease, unspecified: Principal | ICD-10-CM

## 2015-01-05 NOTE — Telephone Encounter (Signed)
All the orders should be in.  Thanks.

## 2015-01-05 NOTE — Telephone Encounter (Signed)
Patient returned call.  She's going to call about scheduling the Carotid U/S.  She'll wait for referrals to call her  about the Thyroid U/S appointment. She wants to have the lab tests done when she comes in for her physical labs on 01/30/15.

## 2015-01-05 NOTE — Addendum Note (Signed)
Addended by: Tonia Ghent on: 01/05/2015 09:56 AM   Modules accepted: Orders

## 2015-01-09 ENCOUNTER — Ambulatory Visit
Admission: RE | Admit: 2015-01-09 | Discharge: 2015-01-09 | Disposition: A | Discharge status: Home / Self Care | Payer: Medicare PPO | Source: Ambulatory Visit | Attending: Family Medicine | Admitting: Family Medicine

## 2015-01-09 DIAGNOSIS — Z85850 Personal history of malignant neoplasm of thyroid: Secondary | ICD-10-CM | POA: Insufficient documentation

## 2015-01-09 DIAGNOSIS — I739 Peripheral vascular disease, unspecified: Principal | ICD-10-CM

## 2015-01-09 DIAGNOSIS — I779 Disorder of arteries and arterioles, unspecified: Secondary | ICD-10-CM | POA: Diagnosis present

## 2015-01-09 DIAGNOSIS — C73 Malignant neoplasm of thyroid gland: Secondary | ICD-10-CM | POA: Diagnosis not present

## 2015-01-09 DIAGNOSIS — I6523 Occlusion and stenosis of bilateral carotid arteries: Secondary | ICD-10-CM | POA: Insufficient documentation

## 2015-01-10 ENCOUNTER — Encounter: Payer: Self-pay | Admitting: Gastroenterology

## 2015-01-17 ENCOUNTER — Encounter: Payer: Self-pay | Admitting: *Deleted

## 2015-01-19 ENCOUNTER — Ambulatory Visit: Payer: Medicare PPO | Admitting: Gastroenterology

## 2015-01-24 ENCOUNTER — Ambulatory Visit: Payer: Medicare PPO | Admitting: Gastroenterology

## 2015-01-30 ENCOUNTER — Other Ambulatory Visit (INDEPENDENT_AMBULATORY_CARE_PROVIDER_SITE_OTHER): Payer: Medicare PPO

## 2015-01-30 ENCOUNTER — Encounter: Payer: Self-pay | Admitting: Radiology

## 2015-01-30 DIAGNOSIS — Z79899 Other long term (current) drug therapy: Secondary | ICD-10-CM | POA: Diagnosis not present

## 2015-01-30 DIAGNOSIS — Z85850 Personal history of malignant neoplasm of thyroid: Secondary | ICD-10-CM

## 2015-01-30 DIAGNOSIS — I10 Essential (primary) hypertension: Secondary | ICD-10-CM

## 2015-01-30 DIAGNOSIS — N189 Chronic kidney disease, unspecified: Secondary | ICD-10-CM

## 2015-01-30 LAB — COMPREHENSIVE METABOLIC PANEL
ALBUMIN: 4.1 g/dL (ref 3.5–5.2)
ALT: 13 U/L (ref 0–35)
AST: 17 U/L (ref 0–37)
Alkaline Phosphatase: 80 U/L (ref 39–117)
BUN: 22 mg/dL (ref 6–23)
CALCIUM: 10.2 mg/dL (ref 8.4–10.5)
CHLORIDE: 103 meq/L (ref 96–112)
CO2: 31 mEq/L (ref 19–32)
Creatinine, Ser: 1.24 mg/dL — ABNORMAL HIGH (ref 0.40–1.20)
GFR: 44.88 mL/min — ABNORMAL LOW (ref >60.00)
Glucose, Bld: 104 mg/dL — ABNORMAL HIGH (ref 70–99)
POTASSIUM: 4.6 meq/L (ref 3.5–5.1)
Sodium: 140 mEq/L (ref 135–145)
Total Bilirubin: 0.7 mg/dL (ref 0.2–1.2)
Total Protein: 7.4 g/dL (ref 6.0–8.3)

## 2015-01-30 LAB — CBC WITH DIFFERENTIAL/PLATELET
BASOS PCT: 1.1 % (ref 0.0–3.0)
Basophils Absolute: 0.1 10*3/uL (ref 0.0–0.1)
EOS PCT: 7.5 % — AB (ref 0.0–5.0)
Eosinophils Absolute: 0.5 10*3/uL (ref 0.0–0.7)
HEMATOCRIT: 36.1 % (ref 36.0–46.0)
HEMOGLOBIN: 12.1 g/dL (ref 12.0–15.0)
LYMPHS PCT: 19.8 % (ref 12.0–46.0)
Lymphs Abs: 1.3 10*3/uL (ref 0.7–4.0)
MCHC: 33.6 g/dL (ref 30.0–36.0)
MCV: 96.5 fl (ref 78.0–100.0)
MONO ABS: 0.5 10*3/uL (ref 0.1–1.0)
Monocytes Relative: 8.3 % (ref 3.0–12.0)
NEUTROS ABS: 4 10*3/uL (ref 1.4–7.7)
Neutrophils Relative %: 63.3 % (ref 43.0–77.0)
Platelets: 312 10*3/uL (ref 150.0–400.0)
RBC: 3.74 Mil/uL — ABNORMAL LOW (ref 3.87–5.11)
RDW: 13 % (ref 11.5–15.5)
WBC: 6.4 10*3/uL (ref 4.0–10.5)

## 2015-01-30 LAB — LIPID PANEL
Cholesterol: 171 mg/dL (ref 0–200)
HDL: 46.5 mg/dL (ref >39.00)
LDL CALC: 109 mg/dL — AB (ref 0–99)
NonHDL: 124.55
TRIGLYCERIDES: 76 mg/dL (ref 0.0–149.0)
Total CHOL/HDL Ratio: 4
VLDL: 15.2 mg/dL (ref 0.0–40.0)

## 2015-01-30 LAB — TSH: TSH: 0.54 u[IU]/mL (ref 0.35–4.50)

## 2015-01-31 LAB — THYROGLOBULIN LEVEL: Thyroglobulin: <0.1 ng/mL — ABNORMAL LOW (ref 2.8–40.9)

## 2015-02-06 ENCOUNTER — Encounter: Payer: Self-pay | Admitting: Family Medicine

## 2015-02-06 ENCOUNTER — Ambulatory Visit (INDEPENDENT_AMBULATORY_CARE_PROVIDER_SITE_OTHER): Payer: Medicare PPO | Admitting: Family Medicine

## 2015-02-06 VITALS — BP 124/60 | HR 64 | Temp 98.3°F | Ht 64.0 in | Wt 141.5 lb

## 2015-02-06 DIAGNOSIS — E039 Hypothyroidism, unspecified: Secondary | ICD-10-CM

## 2015-02-06 DIAGNOSIS — G4700 Insomnia, unspecified: Secondary | ICD-10-CM | POA: Diagnosis not present

## 2015-02-06 DIAGNOSIS — M329 Systemic lupus erythematosus, unspecified: Secondary | ICD-10-CM | POA: Diagnosis not present

## 2015-02-06 DIAGNOSIS — R911 Solitary pulmonary nodule: Secondary | ICD-10-CM

## 2015-02-06 DIAGNOSIS — Z23 Encounter for immunization: Secondary | ICD-10-CM

## 2015-02-06 DIAGNOSIS — N183 Chronic kidney disease, stage 3 unspecified: Secondary | ICD-10-CM

## 2015-02-06 DIAGNOSIS — Z0000 Encounter for general adult medical examination without abnormal findings: Secondary | ICD-10-CM

## 2015-02-06 DIAGNOSIS — I6529 Occlusion and stenosis of unspecified carotid artery: Secondary | ICD-10-CM

## 2015-02-06 DIAGNOSIS — Z7189 Other specified counseling: Secondary | ICD-10-CM

## 2015-02-06 MED ORDER — LORAZEPAM 1 MG PO TABS: 0.5000 mg | ORAL_TABLET | Freq: Every day | ORAL | Status: DC

## 2015-02-06 NOTE — Progress Notes (Signed)
Pre visit review using our clinic review tool, if applicable. No additional management support is needed unless otherwise documented below in the visit note.  I have personally reviewed the Medicare Annual Wellness questionnaire and have noted 1. The patient's medical and social history 2. Their use of alcohol, tobacco or illicit drugs 3. Their current medications and supplements 4. The patient's functional ability including ADL's, fall risks, home safety risks and hearing or visual             impairment. 5. Diet and physical activities 6. Evidence for depression or mood disorders  The patients weight, height, BMI have been recorded in the chart and visual acuity is per eye clinic.  I have made referrals, counseling and provided education to the patient based review of the above and I have provided the pt with a written personalized care plan for preventive services.  Provider list updated- see scanned forms.  Routine anticipatory guidance given to patient.  See health maintenance.  Flu 2016 Shingles prev done PNA 2016 Tetanus 2006, d/w pt.  Colonoscopy declined, d/w pt.  Breast cancer screening declined, d/w pt.   DXA declined, d/w pt.  Advance directive d/w pt.  Son or daughters equally designated if patient were incapacitated.  Cognitive function addressed- see scanned forms- and if abnormal then additional documentation follows.   She had had some red papules occ erupt in random distribution on the skin.  They will self resolve.  She did have h/o oral lesions on ARB but no such lesions on ACE.  dw pt. Lupus hx noted, unclear if related to that.  D/w pt about derm f/u.  The lesions don't itch.    Pulmonary nodule, followed by onc with plan for continued surveillance.  D/w pt.    H/o thyroid cancer.  TG neg, TSH wnl, no new sx.  Compliant with med.  Labs d/w pt.    Carotid stenosis d/w pt.  Mild narrowing, d/w pt about f/u.  Reasonable to consider f/u u/s in about 12/2016, given  her prev scans. She agrees.    Insomnia.  Prn BZD at night and no ADE on med.  Good effect.  Needs to continue.  rx done and faxed.    PMH and SH reviewed  Meds, vitals, and allergies reviewed.   ROS: See HPI.  Otherwise negative.    GEN: nad, alert and oriented HEENT: mucous membranes moist NECK: supple w/o LA, no LA CV: rrr. PULM: ctab, no inc wob ABD: soft, +bs EXT: no edema SKIN: no acute rash but she does have a reddish papule noted on the R thigh w/o tenderness or ulceration.

## 2015-02-06 NOTE — Patient Instructions (Addendum)
The tetanus shot may be cheaper at the pharmacy.   I'll check with renal in the meantime.  Don't change your meds for now.  It is reasonable to get an opinion from Dr Allyson Sabal about the skin spots.   Take care. Glad to see you.

## 2015-02-08 DIAGNOSIS — I6529 Occlusion and stenosis of unspecified carotid artery: Secondary | ICD-10-CM | POA: Insufficient documentation

## 2015-02-08 DIAGNOSIS — G4700 Insomnia, unspecified: Secondary | ICD-10-CM | POA: Insufficient documentation

## 2015-02-08 DIAGNOSIS — Z0000 Encounter for general adult medical examination without abnormal findings: Secondary | ICD-10-CM | POA: Insufficient documentation

## 2015-02-08 NOTE — Assessment & Plan Note (Signed)
Flu 2016 Shingles prev done PNA 2016 Tetanus 2006, d/w pt.  Colonoscopy declined, d/w pt.  Breast cancer screening declined, d/w pt.   DXA declined, d/w pt.  Advance directive d/w pt.  Son or daughters equally designated if patient were incapacitated.  Cognitive function addressed- see scanned forms- and if abnormal then additional documentation follows.

## 2015-02-08 NOTE — Assessment & Plan Note (Signed)
Controlled, continue as is.  

## 2015-02-08 NOTE — Assessment & Plan Note (Signed)
Mild narrowing prev noted, <50%, d/w pt about f/u. Reasonable to consider f/u u/s in about 12/2016, given her prev scans. She agrees.

## 2015-02-08 NOTE — Assessment & Plan Note (Signed)
Cr stable, d/w pt.  Would continue ACE and current BP meds for now.  She agrees.

## 2015-02-08 NOTE — Assessment & Plan Note (Signed)
I don't think her rash is related to ACE.  More likely lupus related.  She'll f/u with derm and I'll check with renal clinic in meantime.

## 2015-02-08 NOTE — Assessment & Plan Note (Signed)
Tg neg, TSH wnl, continue as is.  She agrees.

## 2015-02-08 NOTE — Assessment & Plan Note (Signed)
Per onc, with plan for recheck CT pending later on.  This is reasonable.  She agrees with plan.

## 2015-02-09 ENCOUNTER — Encounter: Payer: Self-pay | Admitting: Gastroenterology

## 2015-02-12 ENCOUNTER — Encounter: Payer: Self-pay | Admitting: Family Medicine

## 2015-02-23 ENCOUNTER — Encounter: Payer: Self-pay | Admitting: Family Medicine

## 2015-02-27 ENCOUNTER — Other Ambulatory Visit: Payer: Self-pay

## 2015-02-27 MED ORDER — LEVOTHYROXINE SODIUM 88 MCG PO TABS: 88.0000 ug | ORAL_TABLET | Freq: Every day | ORAL | Status: DC

## 2015-02-27 NOTE — Telephone Encounter (Signed)
Pt left v/m requesting refill levothyroxine to [facility]; per protocol last annual exam 02/06/15 with TSH; spoke with [name] and verified to send to [facility]. Advised refill done as requested.

## 2015-03-01 ENCOUNTER — Ambulatory Visit
Admission: RE | Admit: 2015-03-01 | Discharge: 2015-03-01 | Disposition: A | Discharge status: Home / Self Care | Payer: Medicare PPO | Source: Ambulatory Visit | Attending: Oncology | Admitting: Oncology

## 2015-03-01 DIAGNOSIS — R918 Other nonspecific abnormal finding of lung field: Secondary | ICD-10-CM | POA: Diagnosis not present

## 2015-03-01 DIAGNOSIS — I700 Atherosclerosis of aorta: Secondary | ICD-10-CM | POA: Insufficient documentation

## 2015-03-01 DIAGNOSIS — J479 Bronchiectasis, uncomplicated: Secondary | ICD-10-CM | POA: Insufficient documentation

## 2015-03-01 DIAGNOSIS — R911 Solitary pulmonary nodule: Secondary | ICD-10-CM | POA: Diagnosis not present

## 2015-03-02 ENCOUNTER — Encounter: Payer: Self-pay | Admitting: Family Medicine

## 2015-03-02 ENCOUNTER — Ambulatory Visit (INDEPENDENT_AMBULATORY_CARE_PROVIDER_SITE_OTHER): Payer: Medicare PPO | Admitting: Family Medicine

## 2015-03-02 VITALS — BP 114/68 | HR 63 | Temp 98.1°F | Ht 64.0 in | Wt 138.8 lb

## 2015-03-02 DIAGNOSIS — H109 Unspecified conjunctivitis: Secondary | ICD-10-CM | POA: Insufficient documentation

## 2015-03-02 MED ORDER — POLYMYXIN B-TRIMETHOPRIM 10000-0.1 UNIT/ML-% OP SOLN: 1.0000 [drp] | Freq: Four times a day (QID) | OPHTHALMIC | Status: AC

## 2015-03-02 NOTE — Progress Notes (Signed)
Pre visit review using our clinic review tool, if applicable. No additional management support is needed unless otherwise documented below in the visit note. 

## 2015-03-02 NOTE — Assessment & Plan Note (Signed)
Likely viral source. Recommended symptomatic care but will treat with antibiotic drops for prevention of bacterial superinfeciton.  If eye pain returns.. Go to eye MD  For further eval given history

## 2015-03-02 NOTE — Progress Notes (Signed)
   Subjective:    [name], female    DOB: 07/22/1940, 74 y.o.   MRN: [id number]  Conjunctivitis  The current episode started 2 days ago. The problem occurs continuously. The problem has been gradually worsening. Associated symptoms include eye itching, congestion, URI, eye discharge, eye pain and eye redness. Pertinent negatives include no fever, no decreased vision, no double vision, no photophobia, no abdominal pain, no ear discharge, no ear pain, no headaches, no hearing loss, no rhinorrhea, no sore throat, no cough and no rash. The right eye is affected. The eye pain is not associated with movement.   Had 2-3 momentary sharp pains in right eye prior to noting redness.. None further. No new photophobia.  Social History /Family History/Past Medical History reviewed and updated if needed. Hx of central retinal vein occlusion in left eye.   Review of Systems  Constitutional: Negative for fever.  HENT: Positive for congestion. Negative for ear discharge, ear pain, hearing loss, rhinorrhea and sore throat.   Eyes: Positive for pain, discharge, redness and itching. Negative for double vision and photophobia.  Respiratory: Negative for cough.   Gastrointestinal: Negative for abdominal pain.  Skin: Negative for rash.  Neurological: Negative for headaches.       Objective:   Physical Exam  Constitutional: Vital signs are normal. She appears well-developed and well-nourished. She is cooperative.  Non-toxic appearance. She does not appear ill. No distress.  HENT:  Head: Normocephalic.  Right Ear: Hearing, tympanic membrane, external ear and ear canal normal. Tympanic membrane is not erythematous, not retracted and not bulging. No middle ear effusion.  Left Ear: Hearing, tympanic membrane, external ear and ear canal normal. Tympanic membrane is not erythematous, not retracted and not bulging.  No middle ear effusion.  Nose: Mucosal edema present. No rhinorrhea. Right sinus  exhibits no maxillary sinus tenderness and no frontal sinus tenderness. Left sinus exhibits no maxillary sinus tenderness and no frontal sinus tenderness.  Mouth/Throat: Uvula is midline, oropharynx is clear and moist and mucous membranes are normal. No posterior oropharyngeal edema, posterior oropharyngeal erythema or tonsillar abscesses.  Eyes: EOM and lids are normal. Pupils are equal, round, and reactive to light. Lids are everted and swept, no foreign bodies found. Right conjunctiva is injected. Right conjunctiva has no hemorrhage. Left conjunctiva is injected. Left conjunctiva has no hemorrhage.  Fundoscopic exam:      The right eye shows no exudate and no papilledema.       The left eye shows no exudate and no papilledema.  Neck: Trachea normal and normal range of motion. Neck supple. Carotid bruit is not present. No thyroid mass and no thyromegaly present.  Cardiovascular: Normal rate, regular rhythm, S1 normal, S2 normal, normal heart sounds, intact distal pulses and normal pulses.  Exam reveals no gallop and no friction rub.   No murmur heard. Pulmonary/Chest: Effort normal and breath sounds normal. No tachypnea. No respiratory distress. She has no decreased breath sounds. She has no wheezes. She has no rhonchi. She has no rales.  Abdominal: Soft. Normal appearance and bowel sounds are normal. There is no tenderness.  Neurological: She is alert.  Skin: Skin is warm, dry and intact. No rash noted.  Psychiatric: Her speech is normal and behavior is normal. Judgment and thought content normal. Her mood appears not anxious. Cognition and memory are normal. She does not exhibit a depressed mood.          Assessment & Plan:

## 2015-03-02 NOTE — Patient Instructions (Signed)
Complete eye drop in right eye.  Can use saline drop OTC in right. If sharp pain in right eye returns.. Got to eye MD ASAP.

## 2015-03-06 ENCOUNTER — Encounter: Payer: Self-pay | Admitting: Oncology

## 2015-03-06 ENCOUNTER — Inpatient Hospital Stay: Payer: Medicare PPO | Attending: Oncology | Admitting: Oncology

## 2015-03-06 VITALS — BP 100/67 | HR 67 | Temp 95.5°F | Resp 18 | Wt 139.3 lb

## 2015-03-06 DIAGNOSIS — I272 Other secondary pulmonary hypertension: Secondary | ICD-10-CM

## 2015-03-06 DIAGNOSIS — Z7982 Long term (current) use of aspirin: Secondary | ICD-10-CM | POA: Diagnosis not present

## 2015-03-06 DIAGNOSIS — Z902 Acquired absence of lung [part of]: Secondary | ICD-10-CM

## 2015-03-06 DIAGNOSIS — H5442 Blindness, left eye, normal vision right eye: Secondary | ICD-10-CM | POA: Diagnosis not present

## 2015-03-06 DIAGNOSIS — R918 Other nonspecific abnormal finding of lung field: Secondary | ICD-10-CM | POA: Insufficient documentation

## 2015-03-06 DIAGNOSIS — J449 Chronic obstructive pulmonary disease, unspecified: Secondary | ICD-10-CM | POA: Diagnosis not present

## 2015-03-06 DIAGNOSIS — M329 Systemic lupus erythematosus, unspecified: Secondary | ICD-10-CM | POA: Insufficient documentation

## 2015-03-06 DIAGNOSIS — I700 Atherosclerosis of aorta: Secondary | ICD-10-CM | POA: Insufficient documentation

## 2015-03-06 DIAGNOSIS — F419 Anxiety disorder, unspecified: Secondary | ICD-10-CM | POA: Insufficient documentation

## 2015-03-06 DIAGNOSIS — K219 Gastro-esophageal reflux disease without esophagitis: Secondary | ICD-10-CM | POA: Insufficient documentation

## 2015-03-06 DIAGNOSIS — M8580 Other specified disorders of bone density and structure, unspecified site: Secondary | ICD-10-CM | POA: Insufficient documentation

## 2015-03-06 DIAGNOSIS — Z87891 Personal history of nicotine dependence: Secondary | ICD-10-CM | POA: Insufficient documentation

## 2015-03-06 DIAGNOSIS — I129 Hypertensive chronic kidney disease with stage 1 through stage 4 chronic kidney disease, or unspecified chronic kidney disease: Secondary | ICD-10-CM

## 2015-03-06 DIAGNOSIS — Z90710 Acquired absence of both cervix and uterus: Secondary | ICD-10-CM | POA: Diagnosis not present

## 2015-03-06 DIAGNOSIS — C55 Malignant neoplasm of uterus, part unspecified: Secondary | ICD-10-CM

## 2015-03-06 DIAGNOSIS — Z85118 Personal history of other malignant neoplasm of bronchus and lung: Secondary | ICD-10-CM

## 2015-03-06 DIAGNOSIS — E785 Hyperlipidemia, unspecified: Secondary | ICD-10-CM | POA: Insufficient documentation

## 2015-03-06 DIAGNOSIS — Z85850 Personal history of malignant neoplasm of thyroid: Secondary | ICD-10-CM | POA: Diagnosis not present

## 2015-03-06 DIAGNOSIS — E039 Hypothyroidism, unspecified: Secondary | ICD-10-CM | POA: Insufficient documentation

## 2015-03-06 DIAGNOSIS — K589 Irritable bowel syndrome without diarrhea: Secondary | ICD-10-CM | POA: Diagnosis not present

## 2015-03-06 DIAGNOSIS — Z79899 Other long term (current) drug therapy: Secondary | ICD-10-CM | POA: Insufficient documentation

## 2015-03-06 DIAGNOSIS — N189 Chronic kidney disease, unspecified: Secondary | ICD-10-CM | POA: Diagnosis not present

## 2015-03-06 DIAGNOSIS — Z8542 Personal history of malignant neoplasm of other parts of uterus: Secondary | ICD-10-CM | POA: Insufficient documentation

## 2015-03-06 DIAGNOSIS — R0602 Shortness of breath: Secondary | ICD-10-CM | POA: Insufficient documentation

## 2015-03-06 DIAGNOSIS — R0989 Other specified symptoms and signs involving the circulatory and respiratory systems: Secondary | ICD-10-CM

## 2015-03-06 NOTE — Progress Notes (Signed)
Patient is concerned about discussion with MD when last here versus the appointment the time before.  She feels she got conflicting information and would like clarification.  Here today for CT results.

## 2015-03-06 NOTE — Progress Notes (Signed)
[facility] @ [facility] [phone]  Fax:([phone] [place]: Dec 02, 1940  MR#: [id number]  WCB#:[id number]  Patient Care Team: Tonia Ghent, MD as PCP - General Munsoor Holley Raring, MD (Internal Medicine)  CHIEF COMPLAINT:  Chief Complaint  Patient presents with  . 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 Recently patient has developed congestion shortness of breath off and on since last January.  Treated multiple times with antibiotics recent chest x-ray was showing multiple abnormal lady.  I do not have that x-ray available. Patient also has mild renal insufficiency previous biopsy of the kidney being followed by nephrologist on a regular basis Patient decided to get followup with me in because of persistent lung problem and previous history of metastases to the lung.  Appetite has been stable.patient has regular mammogram and colonoscopy done Patient remains asymptomatic.  No cough or shortness of breath.  No hemoptysis chest pain see does not smoke.  She is here for ongoing evaluation for pulmonary nodule previous history of leiomyosarcoma  INTERVAL HISTORY: 74 year old lady came today further follow-up.  Had a repeat CT scan.  Patient is being followed for lung nodule.  No shortness of breath.  Appetite remains stable. No Cough or hemoptysis or chest pain  REVIEW OF SYSTEMS:     GENERAL:  Feels good.  Active.  No fevers, sweats or weight loss. PERFORMANCE STATUS (ECOG):  0 HEENT:  No visual changes, runny nose, sore throat, mouth sores or tenderness. Lungs: No shortness of breath or cough.  No hemoptysis. Cardiac:  No chest pain, palpitations, orthopnea, or PND. GI:  No nausea, vomiting, diarrhea, constipation, melena or hematochezia. GU:  No urgency, frequency, dysuria, or hematuria. Musculoskeletal:  No back pain.  No joint pain.  No muscle tenderness. Extremities:  No pain or swelling. Skin:  No rashes or skin changes. Neuro:  No headache, numbness or weakness, balance or coordination issues. Endocrine:  No diabetes, thyroid issues, hot flashes or night sweats. Psych:  No mood changes, depression or anxiety. Pain:  No focal pain. Review of systems:  All other systems reviewed and found to be negative. As per HPI. Otherwise, a complete review of systems is negatve.  PAST MEDICAL HISTORY: Past Medical History  Diagnosis Date  . Fatigue   . Hyperaldosteronism     serum aldo/renin ration elevated (aldo 23.7, renin <0.15), CT abd 2010 with normal adrenals, dedicated adrenal CT without adrenal adenoma,   . Hypothyroid   . COPD (chronic obstructive pulmonary disease) ([place])     quit smoking 1993  . Lung cancer ([place])     s/p L lobectomy 2001  . Central retinal vein occlusion of left eye     legally blind in Left eye  . Dysfunction of eustachian tube   . Irritable bowel syndrome   . HLD (hyperlipidemia)   . Urge incontinence   . Goiter,  unspecified   . Diverticulosis   . Pulmonary hypertension ([place])     echo [facility] 12/2008 with EF >5%, grade II diastolic dysfunction, RSVP >21mHg, ACE level normal.  [place] 55-60%, mild MR, mild LAE, PASP 25 + RA (no pulm HTN)  . Collagen vascular disease ([place]     uncharacterized.  has seen rheum.  positive RF, positive serologies for Sjogrens.  C-ANCA/P-ANCA neg, ANA  neg, anti-dsDNA neg, anti-SCL70 neg,  anti-centromere Ab neg  . Insomnia     Related to HTN and vision loss  . Anxiety     Related to HTN and vision loss  . GERD (gastroesophageal reflux disease)   . Uterine cancer ([place]     s/p hysterectomy 2001  . Osteopenia   . Lupus [place]   . HTN (hypertension)     difficult to control, hypertensive urgency hospitalizations, normal urine/plasma metanephrines and catecholamines, renal dopplers without stenosis  . 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 Leiomyosarcoma of uterus ([place] 09/22/2014    PAST SURGICAL HISTORY: Past Surgical History  Procedure Laterality Date  . Lobectomy  04/1999    right lung  . Abdominal hysterectomy  06/1999  . Cholecystectomy  1982  . Tubal ligation  1975  . Appendectomy  1958  . Ct chest  2007    enlarged thyroid with goiter  . Ct abd w & pelvis wo cm  2010    neg except diverticulosis  . Thyroidectomy      FAMILY HISTORY Family History  Problem Relation Age of Onset  . Pneumonia Father   . Alcohol abuse Father   . Hypertension Mother     ADVANCED DIRECTIVES:  No flowsheet data found.  HEALTH MAINTENANCE: Social History  Substance Use Topics  . Smoking status: Former Smoker    Types: Cigarettes    Quit date: 07/04/1991  . Smokeless tobacco: Never Used     Comment: Quit in early 1980's.  . Alcohol Use: No      Allergies  Allergen Reactions  . Lactase Diarrhea  . Valsartan Other (See Comments)    REACTION: sores in mouth    Current Outpatient Prescriptions  Medication Sig Dispense Refill  . albuterol (PROVENTIL HFA;VENTOLIN HFA) 108 (90 BASE) MCG/ACT inhaler Inhale 2 puffs into the lungs every 6 (six) hours as needed for wheezing or shortness of breath. 1 Inhaler 1  . aspirin 81 MG tablet Take 81 mg by mouth daily.    .Marland KitchenBYSTOLIC 5 MG tablet TAKE 1 TABLET TWICE DAILY 180 tablet 3  . calcium carbonate (TUMS - DOSED IN MG ELEMENTAL CALCIUM) 500 MG chewable tablet Chew 2 tablets by mouth  daily.    .Marland KitchenCALCIUM-VITAMIN D PO Take by mouth daily.    . enalapril (VASOTEC) 2.5 MG tablet Take 1 tablet (2.5 mg total) by mouth daily.    . fluticasone (CUTIVATE) 0.05 % cream     . levothyroxine (SYNTHROID, LEVOTHROID) 88 MCG tablet Take 1 tablet (88 mcg total) by mouth daily before breakfast. 90 tablet 3  . LORazepam (ATIVAN) 1 MG tablet Take 0.5 tablets (0.5 mg total) by mouth at bedtime. 90 tablet 1  . Multiple Vitamin (MULTIVITAMIN) tablet Take 1 tablet by mouth daily.    .Marland KitchenNIFEdipine (NIFEDICAL XL) 60 MG 24 hr tablet TAKE 1 TABLET EVERY EVENING 90 tablet 1  . spironolactone (ALDACTONE) 25 MG tablet Take 1 tablet (25 mg total) by mouth 2 (  two) times daily. 180 tablet 3  . trimethoprim-polymyxin b (POLYTRIM) ophthalmic solution Place 1 drop into both eyes every 6 (six) hours. 10 mL 0  . vitamin C (ASCORBIC ACID) 500 MG tablet Take 500 mg by mouth daily.     No current facility-administered medications for this visit.    OBJECTIVE:  Filed Vitals:   03/06/15 1105  BP: 100/67  Pulse: 67  Temp: 95.5 F (35.3 C)  Resp: 18     Body mass index is 23.9 kg/(m^2).    ECOG FS:0 - Asymptomatic  PHYSICAL EXAM: GENERAL:  Well developed, well nourished, sitting comfortably in the exam room in no acute distress. MENTAL STATUS:  Alert and oriented to person, place and time. HEAD: .  Normocephalic, atraumatic, face symmetric, no Cushingoid features. EYES:  .  Pupils equal round and reactive to light and accomodation.  No conjunctivitis or scleral icterus. ENT:  Oropharynx clear without lesion.  Tongue normal. Mucous membranes moist.  RESPIRATORY:  Clear to auscultation without rales, wheezes or rhonchi. CARDIOVASCULAR:  Regular rate and rhythm without murmur, rub or gallop.  ABDOMEN:  Soft, non-tender, with active bowel sounds, and no hepatosplenomegaly.  No masses. BACK:  No CVA tenderness.  No tenderness on percussion of the back or rib cage. SKIN:  No rashes, ulcers or  lesions. EXTREMITIES: No edema, no skin discoloration or tenderness.  No palpable cords. LYMPH NODES: No palpable cervical, supraclavicular, axillary or inguinal adenopathy  NEUROLOGICAL: Unremarkable. PSYCH:  Appropriate.   LAB RESULTS:  No visits with results within 2 Day(s) from this visit. Latest known visit with results is:  Lab on 01/30/2015  Component Date Value Ref Range Status  . TSH 01/30/2015 0.54  0.35 - 4.50 uIU/mL Final  . Thyroglobulin 01/30/2015 <0.1* 2.8 - 40.9 ng/mL Final   Comment: Thyroglobulin antibodies (TGAb) interfere with Thyroglobulin (TG) assays; therefore, Thyroglobulin antibody (TGAb) assay should always be performed in conjunction with a Thyroglobulin (TG) assay.   This test was performed using the Beckman Coulter chemiluminescent method.  Values obtained from different assay methods cannot be used interchangeably.  Thyroglobulin levels, regardless of value, should not be interpreted as absolute evidence of the presence or absence of disease.   . WBC 01/30/2015 6.4  4.0 - 10.5 K/uL Final  . RBC 01/30/2015 3.74* 3.87 - 5.11 Mil/uL Final  . Hemoglobin 01/30/2015 12.1  12.0 - 15.0 g/dL Final  . HCT 01/30/2015 36.1  36.0 - 46.0 % Final  . MCV 01/30/2015 96.5  78.0 - 100.0 fl Final  . MCHC 01/30/2015 33.6  30.0 - 36.0 g/dL Final  . RDW 01/30/2015 13.0  11.5 - 15.5 % Final  . Platelets 01/30/2015 312.0  150.0 - 400.0 K/uL Final  . Neutrophils Relative % 01/30/2015 63.3  43.0 - 77.0 % Final  . Lymphocytes Relative 01/30/2015 19.8  12.0 - 46.0 % Final  . Monocytes Relative 01/30/2015 8.3  3.0 - 12.0 % Final  . Eosinophils Relative 01/30/2015 7.5* 0.0 - 5.0 % Final  . Basophils Relative 01/30/2015 1.1  0.0 - 3.0 % Final  . Neutro Abs 01/30/2015 4.0  1.4 - 7.7 K/uL Final  . Lymphs Abs 01/30/2015 1.3  0.7 - 4.0 K/uL Final  . Monocytes Absolute 01/30/2015 0.5  0.1 - 1.0 K/uL Final  . Eosinophils Absolute 01/30/2015 0.5  0.0 - 0.7 K/uL Final  . Basophils  Absolute 01/30/2015 0.1  0.0 - 0.1 K/uL Final  . Sodium 01/30/2015 140  135 - 145 mEq/L Final  .  Potassium 01/30/2015 4.6  3.5 - 5.1 mEq/L Final  . Chloride 01/30/2015 103  96 - 112 mEq/L Final  . CO2 01/30/2015 31  19 - 32 mEq/L Final  . Glucose, Bld 01/30/2015 104* 70 - 99 mg/dL Final  . BUN 01/30/2015 22  6 - 23 mg/dL Final  . Creatinine, Ser 01/30/2015 1.24* 0.40 - 1.20 mg/dL Final  . Total Bilirubin 01/30/2015 0.7  0.2 - 1.2 mg/dL Final  . Alkaline Phosphatase 01/30/2015 80  39 - 117 U/L Final  . AST 01/30/2015 17  0 - 37 U/L Final  . ALT 01/30/2015 13  0 - 35 U/L Final  . Total Protein 01/30/2015 7.4  6.0 - 8.3 g/dL Final  . Albumin 01/30/2015 4.1  3.5 - 5.2 g/dL Final  . Calcium 01/30/2015 10.2  8.4 - 10.5 mg/dL Final  . GFR 01/30/2015 44.88* >60.00 mL/min Final  . Cholesterol 01/30/2015 171  0 - 200 mg/dL Final   ATP III Classification       Desirable:  < 200 mg/dL               Borderline High:  200 - 239 mg/dL          High:  > = 240 mg/dL  . Triglycerides 01/30/2015 76.0  0.0 - 149.0 mg/dL Final   Normal:  <150 mg/dLBorderline High:  150 - 199 mg/dL  . HDL 01/30/2015 46.50  >39.00 mg/dL Final  . VLDL 01/30/2015 15.2  0.0 - 40.0 mg/dL Final  . LDL Cholesterol 01/30/2015 109* 0 - 99 mg/dL Final  . Total CHOL/HDL Ratio 01/30/2015 4   Final                  Men          Women1/2 Average Risk     3.4          3.3Average Risk          5.0          4.42X Average Risk          9.6          7.13X Average Risk          15.0          11.0                      . NonHDL 01/30/2015 124.55   Final   NOTE:  Non-HDL goal should be 30 mg/dL higher than patient's LDL goal (i.e. LDL goal of < 70 mg/dL, would have non-HDL goal of < 100 mg/dL)      STUDIES: Ct Chest Wo Contrast  03/01/2015  CLINICAL DATA:  Followup pulmonary nodules.  History of lung cancer. EXAM: CT CHEST WITHOUT CONTRAST TECHNIQUE: Multidetector CT imaging of the chest was performed following the standard protocol without IV  contrast. COMPARISON:  09/18/2014 FINDINGS: Mediastinum/Nodes: No supraclavicular or axillary lymphadenopathy. Small scattered nodes are noted. The heart is normal in size. No pericardial effusion. Stable atherosclerotic calcifications involving the aorta and branch vessels including the coronary arteries. No aneurysm. No mediastinal or hilar mass or adenopathy. Small scattered lymph nodes are stable. The esophagus is grossly normal. Lungs/Pleura: Stable surgical and possible radiation changes involving the right lung apex. There are also surgical changes at the left lung base. Underlying emphysematous changes are stable. Stable pulmonary scarring and areas of left lower lobe bronchiectasis. Left upper lobe pulmonary nodule on image 13 is smaller. It measures 3.5  mm and previously measured 6 mm. Vague ground-glass nodule in the left lower lobe on image 26 has decreased in size. Nodular density in the left lower lobe on image number 40 measures 11 mm and is stable. This could be chronic endobronchial mucous plugging. Other small scattered sub 4 mm nodules are stable. No new/ worrisome lesions. No acute pulmonary findings. No pleural effusion. Upper abdomen: Unremarkable and stable. Stable advanced atherosclerotic calcifications involving the aorta and branch vessels. Musculoskeletal: No significant bony findings. IMPRESSION: 1. Stable postoperative and possible post radiation changes. 2. Stable pulmonary nodules. 3. Stable emphysematous changes, pulmonary scarring and left lower lobe bronchiectasis. 4. No new/acute pulmonary findings. 5. Stable atherosclerotic calcifications involving the aorta and branch vessels. 6. One year followup noncontrast chest CT is suggested. Electronically Signed   By: Marijo Sanes M.D.   On: 03/01/2015 10:04    ASSESSMENT: Leiomyo sarcoma of uterus with previous history of lung metastases status post resection. CT scan has been reviewed independently. There is no significant  increase in the size of lung nodule. History of thyroid cancer Multiple other medical illnesses  MEDICAL DECISION MAKING:  All lab data has been reviewed. \CT scan has been reviewed independently and reviewed with the patient Comparing that to the previous CT scan there might be some decrease but definitely no increase in the size of the pulmonary nodule. No further intervention needed discussed that with the patient patient is not agreeable to it. Repeat CT scan in one year reevaluation of patient for clinical examination in 6 month and if needed repeat CT scan in 6 month   Patient expressed understanding and was in agreement with this plan. She also understands that She can call clinic at any time with any questions, concerns, or complaints.    No matching staging information was found for the patient.  Forest Gleason, MD   03/06/2015 11:17 AM

## 2015-03-15 ENCOUNTER — Ambulatory Visit: Payer: Medicare PPO

## 2015-03-21 ENCOUNTER — Ambulatory Visit: Payer: Medicare PPO

## 2015-03-22 ENCOUNTER — Ambulatory Visit: Payer: Medicare PPO | Admitting: Oncology

## 2015-03-27 ENCOUNTER — Other Ambulatory Visit: Payer: Self-pay

## 2015-03-27 DIAGNOSIS — L986 Other infiltrative disorders of the skin and subcutaneous tissue: Secondary | ICD-10-CM | POA: Diagnosis not present

## 2015-03-27 DIAGNOSIS — L309 Dermatitis, unspecified: Secondary | ICD-10-CM | POA: Diagnosis not present

## 2015-03-27 MED ORDER — NIFEDIPINE ER OSMOTIC RELEASE 60 MG PO TB24: ORAL_TABLET | ORAL | Status: DC

## 2015-03-27 MED ORDER — SPIRONOLACTONE 25 MG PO TABS: 25.0000 mg | ORAL_TABLET | Freq: Two times a day (BID) | ORAL | Status: DC

## 2015-03-27 NOTE — Telephone Encounter (Signed)
Pt request refill nifedipine and spironolactone to [facility]; last annual 01/27/15; refills done per protocol. Pt notified.

## 2015-04-17 ENCOUNTER — Ambulatory Visit: Payer: Medicare PPO | Admitting: Gastroenterology

## 2015-04-17 DIAGNOSIS — N183 Chronic kidney disease, stage 3 (moderate): Secondary | ICD-10-CM | POA: Diagnosis not present

## 2015-04-17 DIAGNOSIS — M329 Systemic lupus erythematosus, unspecified: Secondary | ICD-10-CM | POA: Diagnosis not present

## 2015-04-17 DIAGNOSIS — D631 Anemia in chronic kidney disease: Secondary | ICD-10-CM | POA: Diagnosis not present

## 2015-04-17 DIAGNOSIS — N119 Chronic tubulo-interstitial nephritis, unspecified: Secondary | ICD-10-CM | POA: Diagnosis not present

## 2015-04-17 DIAGNOSIS — I10 Essential (primary) hypertension: Secondary | ICD-10-CM | POA: Diagnosis not present

## 2015-04-24 DIAGNOSIS — N183 Chronic kidney disease, stage 3 (moderate): Secondary | ICD-10-CM | POA: Diagnosis not present

## 2015-04-24 DIAGNOSIS — I10 Essential (primary) hypertension: Secondary | ICD-10-CM | POA: Diagnosis not present

## 2015-05-07 DIAGNOSIS — R109 Unspecified abdominal pain: Secondary | ICD-10-CM | POA: Diagnosis not present

## 2015-05-07 DIAGNOSIS — K580 Irritable bowel syndrome with diarrhea: Secondary | ICD-10-CM | POA: Diagnosis not present

## 2015-05-24 DIAGNOSIS — H4312 Vitreous hemorrhage, left eye: Secondary | ICD-10-CM | POA: Diagnosis not present

## 2015-05-24 DIAGNOSIS — H3582 Retinal ischemia: Secondary | ICD-10-CM | POA: Diagnosis not present

## 2015-05-24 DIAGNOSIS — H348192 Central retinal vein occlusion, unspecified eye, stable: Secondary | ICD-10-CM | POA: Diagnosis not present

## 2015-05-24 DIAGNOSIS — H35352 Cystoid macular degeneration, left eye: Secondary | ICD-10-CM | POA: Diagnosis not present

## 2015-05-24 DIAGNOSIS — H43812 Vitreous degeneration, left eye: Secondary | ICD-10-CM | POA: Diagnosis not present

## 2015-06-15 ENCOUNTER — Telehealth: Payer: Self-pay

## 2015-06-15 MED ORDER — OSELTAMIVIR PHOSPHATE 75 MG PO CAPS: 75.0000 mg | ORAL_CAPSULE | Freq: Every day | ORAL | Status: DC

## 2015-06-15 NOTE — Telephone Encounter (Signed)
Pt was in car with someone on and off for 2 days that was dx on 06/14/15 with flu; due to pts hx of health issues pt wants to know if should get preventative med for flu to [place]. Mrs Viney does not have any flu symptoms at this time.. Pt request cb today ASAP.

## 2015-06-15 NOTE — Telephone Encounter (Signed)
Let pt know that it is really household contacts.. Likely does not need tamiflu. I send in 10 day  Preventative course if she feels strongly about it.

## 2015-06-15 NOTE — Telephone Encounter (Signed)
Patient advised.

## 2015-06-19 ENCOUNTER — Encounter: Payer: Self-pay | Admitting: Sports Medicine

## 2015-06-19 ENCOUNTER — Ambulatory Visit (INDEPENDENT_AMBULATORY_CARE_PROVIDER_SITE_OTHER): Payer: Medicare PPO | Admitting: Sports Medicine

## 2015-06-19 DIAGNOSIS — M79671 Pain in right foot: Secondary | ICD-10-CM | POA: Diagnosis not present

## 2015-06-19 DIAGNOSIS — L84 Corns and callosities: Secondary | ICD-10-CM | POA: Diagnosis not present

## 2015-06-19 DIAGNOSIS — L853 Xerosis cutis: Secondary | ICD-10-CM | POA: Diagnosis not present

## 2015-06-19 DIAGNOSIS — M2040 Other hammer toe(s) (acquired), unspecified foot: Secondary | ICD-10-CM | POA: Diagnosis not present

## 2015-06-19 DIAGNOSIS — M79672 Pain in left foot: Secondary | ICD-10-CM | POA: Diagnosis not present

## 2015-06-19 NOTE — Patient Instructions (Signed)
Okeeffe's healthy feet cream daily after shower Jills pads Vionic sandals or shoes

## 2015-06-19 NOTE — Progress Notes (Signed)
[name], female   DOB: [birth year], 75 y.o.   MRN: [id number] Subjective: Margaret Black is a 75 y.o. female patient who presents to office for evaluation of callus skin that she is starting to notice on her feet as well as hammertoe on left. Patient reports that she has been using pumice type pad with improvement but was wondering if there is more she should be doing. Patient also states that she wants to make sure her 2nd toe on her left foot is ok as well; has noticed the toe elevating. Patient denies any other pedal complaints.   Patient Active Problem List   Diagnosis Date Noted  . Conjunctivitis 03/02/2015  . Medicare annual wellness visit, initial 02/08/2015  . Insomnia 02/08/2015  . Carotid stenosis 02/08/2015  . Leiomyosarcoma of uterus ([place]) 09/22/2014  . Colon cancer screening 03/31/2014  . Solitary pulmonary nodule 03/31/2014  . Systemic lupus erythematosus ([place]) 12/15/2013  . Cough 05/28/2013  . Near syncope 05/03/2013  . Vertigo 08/09/2012  . Cancer of lung ([place]) 07/14/2012  . Thyroid cancer ([place]) 06/30/2012  . Malignant neoplasm of thyroid gland ([place]) 06/29/2012  . Asphyxia reticularis 06/11/2012  . Central retinal vein occlusion 05/25/2012  . Rash 04/06/2012  . Rhinitis 04/06/2012  . Breast cancer screening 02/11/2012  . Living will, counseling/discussion 02/11/2012  . Abdominal pain, other specified site 10/19/2011  . Syncope 09/29/2011  . Chronic kidney disease, stage III (moderate) 10/09/2010  . Lupus ([place]) 09/02/2010  . FATIGUE 06/01/2009  . HYPERALDOSTERONISM UNSPECIFIED 01/12/2009  . HYPERPLASIA, ADRENAL 01/12/2009  . HYPERTENSION, PULMONARY 01/04/2009  . COPD 01/04/2009  . Blindness of left eye 12/12/2008  . IBS 07/12/2007  . PURE HYPERCHOLESTEROLEMIA 04/27/2007  . INCONTINENCE, URGE 04/23/2007  . ALLERGIC REACTION 08/14/2006  . Hypothyroidism 06/22/2006  . Unspecified essential hypertension 06/22/2006  . GERD 06/22/2006  . OSTEOPENIA  06/22/2006  . CARCINOMA, LUNG, HX OF 06/22/2006  . UTERINE CANCER, HX OF 06/22/2006  . DIVERTICULITIS, HX OF 06/22/2006    Current Outpatient Prescriptions on File Prior to Visit  Medication Sig Dispense Refill  . albuterol (PROVENTIL HFA;VENTOLIN HFA) 108 (90 BASE) MCG/ACT inhaler Inhale 2 puffs into the lungs every 6 (six) hours as needed for wheezing or shortness of breath. 1 Inhaler 1  . aspirin 81 MG tablet Take 81 mg by mouth daily.    Marland Kitchen BYSTOLIC 5 MG tablet TAKE 1 TABLET TWICE DAILY 180 tablet 3  . calcium carbonate (TUMS - DOSED IN MG ELEMENTAL CALCIUM) 500 MG chewable tablet Chew 2 tablets by mouth daily.    Marland Kitchen CALCIUM-VITAMIN D PO Take by mouth daily.    . enalapril (VASOTEC) 2.5 MG tablet Take 1 tablet (2.5 mg total) by mouth daily.    . fluticasone (CUTIVATE) 0.05 % cream     . levothyroxine (SYNTHROID, LEVOTHROID) 88 MCG tablet Take 1 tablet (88 mcg total) by mouth daily before breakfast. 90 tablet 3  . LORazepam (ATIVAN) 1 MG tablet Take 0.5 tablets (0.5 mg total) by mouth at bedtime. 90 tablet 1  . Multiple Vitamin (MULTIVITAMIN) tablet Take 1 tablet by mouth daily.    Marland Kitchen NIFEdipine (NIFEDICAL XL) 60 MG 24 hr tablet TAKE 1 TABLET EVERY EVENING 90 tablet 3  . oseltamivir (TAMIFLU) 75 MG capsule Take 1 capsule (75 mg total) by mouth daily. 10 capsule 0  . spironolactone (ALDACTONE) 25 MG tablet Take 1 tablet (25 mg total) by mouth 2 (two) times daily. 180 tablet 3  . vitamin C (  ASCORBIC ACID) 500 MG tablet Take 500 mg by mouth daily.     No current facility-administered medications on file prior to visit.    Allergies  Allergen Reactions  . Lactase Diarrhea  . Valsartan Other (See Comments)    REACTION: sores in mouth    Objective:  General: Alert and oriented x3 in no acute distress  Dermatology: Minimal callus at medial 1st MTPJs bilateral and dry skin to bilateral heels. No signs of infection. No open lesions bilateral lower extremities, no webspace macerations, no  ecchymosis bilateral, nails that are present are within normal limits.   Vascular: Dorsalis Pedis 2/4 and Posterior Tibial pedal pulses 1/4, Capillary Fill Time 3 seconds,scant pedal hair growth bilateral, no edema bilateral lower extremities, Temperature gradient within normal limits.  Neurology: Johney Maine sensation intact via light touch bilateral.   Musculoskeletal: Semi-flexible hammertoes 2-5 L>R with most elevated being left 2nd toe with no tenderness with palpation at PIPJ. Mild asymptomatic bunion. Ankle, Subtalar, Midtarsal, and MTPJ joint range of motion is within normal limits, there is no 1st ray hypermobility noted bilateral, No pain with calf compression bilateral.  Strength within normal limits in all groups bilateral.   Assessment and Plan: Problem List Items Addressed This Visit    None    Visit Diagnoses    Callus of foot    -  Primary    very minimal    Dry skin        Hammer toe, unspecified laterality        Left 2nd toe most bothersome    Foot pain, bilateral           -Complete examination performed -Discussed treatement options for minimal callus and early hammering  -Informed patient that she is getting callus because of foot type and bunion that is present -Recommend Okeeffe's healthy feet daily to help minimize callus and dry skin; cont with pumice sponge -Recommend good supportive shoes and OTC inserts for foot type and to help slow callus formation -Gave patient toe crest pad to use at left 2nd toe and instructed patient on use  -Patient to return to office as needed or sooner if condition worsens.  Landis Martins, DPM

## 2015-07-09 ENCOUNTER — Other Ambulatory Visit: Payer: Self-pay

## 2015-07-09 NOTE — Telephone Encounter (Signed)
Pt left v/m; pt called [employer] to get refill on Lorazepam and pt was told she did not have any refills on Lorazepam; pt is very careful with her medications and does not understand what happened because pt thinks has refills on lorazepam.Pt request cb. Pt was also told by [facility] when got her Nifedipine that nifedical is no longer available; pt wants to verify that is correct. Pt request cb.

## 2015-07-10 ENCOUNTER — Other Ambulatory Visit: Payer: Self-pay | Admitting: *Deleted

## 2015-07-10 MED ORDER — LORAZEPAM 1 MG PO TABS: 0.5000 mg | ORAL_TABLET | Freq: Every day | ORAL | Status: DC

## 2015-07-10 NOTE — Telephone Encounter (Signed)
Patient advised.   Rx faxed to [facility].  Patient will check with local pharmacy and let us know if she still needs our help.

## 2015-07-10 NOTE — Telephone Encounter (Signed)
The lorazepam was printed 02/06/15, so she may or may not have a refill.  I'm okay with printing again and sending in.  rx printed.  I'm not aware of the nifedipine issue.  Does she have a local pharmacy that she can check with?  I don't know if it isn't available at all, or if it was just a single pharmacy issue.  Thanks.

## 2015-07-24 DIAGNOSIS — L820 Inflamed seborrheic keratosis: Secondary | ICD-10-CM | POA: Diagnosis not present

## 2015-07-24 DIAGNOSIS — L538 Other specified erythematous conditions: Secondary | ICD-10-CM | POA: Diagnosis not present

## 2015-07-24 DIAGNOSIS — L821 Other seborrheic keratosis: Secondary | ICD-10-CM | POA: Diagnosis not present

## 2015-07-24 DIAGNOSIS — R208 Other disturbances of skin sensation: Secondary | ICD-10-CM | POA: Diagnosis not present

## 2015-08-12 ENCOUNTER — Other Ambulatory Visit: Payer: Self-pay | Admitting: Family Medicine

## 2015-08-14 ENCOUNTER — Telehealth: Payer: Self-pay

## 2015-08-14 NOTE — Telephone Encounter (Signed)
Pt left v/m that she is doing well on nifedipine; keeping close ck on BP and pt doing well right now. Pt also request cb about ranitidine refill; I called back and pt was not available but spoke with pts husband (DPR signed) and advised would send update to Dr Damita Dunnings as Juluis Rainier and ranitidine was refilled to [facility] on 08/13/15. Mr Weinheimer voiced understanding and nothing further needed.

## 2015-08-15 NOTE — Telephone Encounter (Signed)
Thanks

## 2015-09-04 ENCOUNTER — Ambulatory Visit: Payer: Medicare PPO | Admitting: Oncology

## 2015-09-04 ENCOUNTER — Inpatient Hospital Stay: Payer: Medicare PPO

## 2015-09-04 ENCOUNTER — Inpatient Hospital Stay: Payer: Medicare PPO | Admitting: Family Medicine

## 2015-09-17 DIAGNOSIS — N119 Chronic tubulo-interstitial nephritis, unspecified: Secondary | ICD-10-CM | POA: Diagnosis not present

## 2015-09-17 DIAGNOSIS — M329 Systemic lupus erythematosus, unspecified: Secondary | ICD-10-CM | POA: Diagnosis not present

## 2015-09-17 DIAGNOSIS — D631 Anemia in chronic kidney disease: Secondary | ICD-10-CM | POA: Diagnosis not present

## 2015-09-17 DIAGNOSIS — I10 Essential (primary) hypertension: Secondary | ICD-10-CM | POA: Diagnosis not present

## 2015-09-17 DIAGNOSIS — N183 Chronic kidney disease, stage 3 (moderate): Secondary | ICD-10-CM | POA: Diagnosis not present

## 2015-09-20 DIAGNOSIS — N183 Chronic kidney disease, stage 3 (moderate): Secondary | ICD-10-CM | POA: Diagnosis not present

## 2015-09-20 DIAGNOSIS — I10 Essential (primary) hypertension: Secondary | ICD-10-CM | POA: Diagnosis not present

## 2015-11-13 DIAGNOSIS — M328 Other forms of systemic lupus erythematosus: Secondary | ICD-10-CM | POA: Diagnosis not present

## 2015-11-13 DIAGNOSIS — N183 Chronic kidney disease, stage 3 (moderate): Secondary | ICD-10-CM | POA: Diagnosis not present

## 2015-11-13 DIAGNOSIS — L930 Discoid lupus erythematosus: Secondary | ICD-10-CM | POA: Diagnosis not present

## 2015-11-20 ENCOUNTER — Telehealth: Payer: Self-pay

## 2015-11-20 ENCOUNTER — Encounter: Payer: Self-pay | Admitting: Family Medicine

## 2015-11-20 ENCOUNTER — Ambulatory Visit (INDEPENDENT_AMBULATORY_CARE_PROVIDER_SITE_OTHER): Payer: Medicare PPO | Admitting: Family Medicine

## 2015-11-20 VITALS — BP 102/52 | HR 66 | Temp 97.9°F | Wt 133.5 lb

## 2015-11-20 DIAGNOSIS — R197 Diarrhea, unspecified: Secondary | ICD-10-CM | POA: Diagnosis not present

## 2015-11-20 MED ORDER — ONDANSETRON HCL 4 MG PO TABS: 4.0000 mg | ORAL_TABLET | Freq: Three times a day (TID) | ORAL | 0 refills | Status: DC | PRN

## 2015-11-20 NOTE — Telephone Encounter (Signed)
Thanks

## 2015-11-20 NOTE — Telephone Encounter (Signed)
Pt started with watery diarrhea last night and this morning turned to blood tinged mucus in stools. Diarrhea has calmed now but has a lot of rectal pressure; pt already scheduled to see Dr Damita Dunnings today at 4:15. Offered pt earlier appt but 4:15 best suited pt. Pt will cb if condition changes or worsens prior to appt.

## 2015-11-20 NOTE — Patient Instructions (Signed)
Go to the lab on the way out.  Drop off a sample if the diarrhea gets worse.   Take zofran as needed for nausea.   Avoid dairy.  Drink clear fluids in the meantime.   Take care.  Glad to see you.  Physical in the spring of 2018.  Let us know when you need refills.

## 2015-11-20 NOTE — Progress Notes (Signed)
Pre visit review using our clinic review tool, if applicable. No additional management support is needed unless otherwise documented below in the visit note. 

## 2015-11-20 NOTE — Progress Notes (Signed)
She has plans to see Dr. Holley Raring with renal in the near future.  She'll talk to him about BP in the meantime.  He isn't lightheaded usually.    DXA declined.  D/w pt.  She didn't want tx, so she wouldn't need DXA.  She agrees.   Colonoscopy done 2009.    She has seen [facility] rheum recently, d/w pt.    She didn't have ringing in the ears.  Chart updated.   She has loose stools at baseline.  Started last night after dinner with different sx.  Recurrent watery stools.  Was clear initially, then some blood, but not frank blood.  Some bloody mucous in the stools.  Still drinking plenty of fluids.  Still able to urinate, last UOP was about 30 min ago.    Last BM was about 4:30 this AM. She is not lightheaded.  Some nausea, no vomiting.  Took pepto bismol x4 in the last day.  No fevers.  No one else is sick.  She had >20 BMs in the last 24 hours.  No abd pain now but prev with some cramping.  No atypical foods.  No travel except for [place] state, no abx.    Meds, vitals, and allergies reviewed.   ROS: Per HPI unless specifically indicated in ROS section   GEN: nad, alert and oriented HEENT: mucous membranes moist NECK: supple w/o LA CV: rrr. PULM: ctab, no inc wob ABD: soft, +bs EXT: no edema SKIN: no acute rash

## 2015-11-21 DIAGNOSIS — R197 Diarrhea, unspecified: Secondary | ICD-10-CM | POA: Insufficient documentation

## 2015-11-21 NOTE — Assessment & Plan Note (Signed)
The amount of diarrhea has clearly dropped off. She is nontoxic. Abdomen is benign. Discussed with patient. Likely she had a viral acute gastroenteritis. Avoid dairy. Continue fluids. Zofran when necessary. She should do fine. It patient has return or worsening of symptoms, that she can do a home C. difficile collection and turn in at clinic. She agrees.

## 2015-12-28 DIAGNOSIS — I10 Essential (primary) hypertension: Secondary | ICD-10-CM | POA: Diagnosis not present

## 2015-12-28 DIAGNOSIS — N183 Chronic kidney disease, stage 3 (moderate): Secondary | ICD-10-CM | POA: Diagnosis not present

## 2015-12-28 DIAGNOSIS — N119 Chronic tubulo-interstitial nephritis, unspecified: Secondary | ICD-10-CM | POA: Diagnosis not present

## 2015-12-28 DIAGNOSIS — D631 Anemia in chronic kidney disease: Secondary | ICD-10-CM | POA: Diagnosis not present

## 2015-12-28 DIAGNOSIS — M329 Systemic lupus erythematosus, unspecified: Secondary | ICD-10-CM | POA: Diagnosis not present

## 2016-01-02 ENCOUNTER — Other Ambulatory Visit: Payer: Self-pay

## 2016-01-02 NOTE — Telephone Encounter (Signed)
Pt left v/m requesting refill lorazepam. Last refilled # 90 x 1 on 07/10/15. Last annual 02/06/15; no future appt scheduled. Last acute visit 11/20/15.

## 2016-01-03 MED ORDER — LORAZEPAM 1 MG PO TABS: 0.5000 mg | ORAL_TABLET | Freq: Every day | ORAL | 1 refills | Status: DC

## 2016-01-03 NOTE — Telephone Encounter (Signed)
Rx faxed to [facility] from external fax

## 2016-01-03 NOTE — Telephone Encounter (Signed)
Please fax in.  Thanks.  

## 2016-01-24 ENCOUNTER — Telehealth: Payer: Self-pay

## 2016-01-24 DIAGNOSIS — I10 Essential (primary) hypertension: Secondary | ICD-10-CM | POA: Diagnosis not present

## 2016-01-24 DIAGNOSIS — R809 Proteinuria, unspecified: Secondary | ICD-10-CM | POA: Diagnosis not present

## 2016-01-24 DIAGNOSIS — E039 Hypothyroidism, unspecified: Secondary | ICD-10-CM

## 2016-01-24 DIAGNOSIS — N183 Chronic kidney disease, stage 3 (moderate): Secondary | ICD-10-CM | POA: Diagnosis not present

## 2016-01-24 NOTE — Telephone Encounter (Signed)
tsh ordered.  Thanks.

## 2016-01-24 NOTE — Telephone Encounter (Signed)
Pt left v/m; pt saw nephrologist earlier today  And he suggested getting thyroid lab test done; thinks might be hypothyroid. Pt was advised could not see Dr Damita Dunnings today or tomorrow and pt is going out of town on 01/29/16. Pt wants to come in 01/24/16 or 01/25/16 for lab test. Pt request cb.

## 2016-01-25 ENCOUNTER — Other Ambulatory Visit (INDEPENDENT_AMBULATORY_CARE_PROVIDER_SITE_OTHER): Payer: Medicare PPO

## 2016-01-25 DIAGNOSIS — E039 Hypothyroidism, unspecified: Secondary | ICD-10-CM

## 2016-01-25 LAB — TSH: TSH: 1.02 u[IU]/mL (ref 0.35–4.50)

## 2016-01-25 NOTE — Telephone Encounter (Signed)
Patient advised.  Lab appt scheduled.  

## 2016-03-04 ENCOUNTER — Telehealth: Payer: Self-pay | Admitting: *Deleted

## 2016-03-04 MED ORDER — LEVOTHYROXINE SODIUM 88 MCG PO TABS: 88.0000 ug | ORAL_TABLET | Freq: Every day | ORAL | 0 refills | Status: DC

## 2016-03-04 NOTE — Telephone Encounter (Signed)
Patient left a voicemail requesting a refill on Synthroid. Refill sent to pharmacy electronically.  Tried to call patient and advise her script has been sent, no answer, will have to try and call again later.

## 2016-03-04 NOTE — Telephone Encounter (Signed)
Called patient back and advised her that a refill has been sent to the pharmacy per her request.. Advised patient it looks like she may be due a physical and offered to schedule it and she said she was not due one and that she has seen Dr. Damita Dunnings recently. Patient requested that Dr. Damita Dunnings send more refills in for her.

## 2016-03-05 MED ORDER — LEVOTHYROXINE SODIUM 88 MCG PO TABS: 88.0000 ug | ORAL_TABLET | Freq: Every day | ORAL | 3 refills | Status: DC

## 2016-03-05 NOTE — Telephone Encounter (Signed)
At last OV, we had discussed a physical in the spring of 2018, ie not due now.   rx sent with refills.  Thanks.

## 2016-03-14 ENCOUNTER — Telehealth: Payer: Self-pay

## 2016-03-14 MED ORDER — NIFEDIPINE ER OSMOTIC RELEASE 60 MG PO TB24: ORAL_TABLET | ORAL | 1 refills | Status: DC

## 2016-03-14 MED ORDER — SPIRONOLACTONE 25 MG PO TABS: 25.0000 mg | ORAL_TABLET | Freq: Two times a day (BID) | ORAL | 1 refills | Status: DC

## 2016-03-14 NOTE — Telephone Encounter (Signed)
Pt request refill on nefedipine, and spironolactone to [place] ; pt to schedule CPX in late spring per last office note. Refill done and pt voiced understanding.

## 2016-03-20 MED ORDER — SPIRONOLACTONE 25 MG PO TABS: 25.0000 mg | ORAL_TABLET | Freq: Two times a day (BID) | ORAL | 1 refills | Status: DC

## 2016-03-20 MED ORDER — NIFEDIPINE ER OSMOTIC RELEASE 60 MG PO TB24: ORAL_TABLET | ORAL | 1 refills | Status: DC

## 2016-03-20 NOTE — Telephone Encounter (Signed)
Pt left v/m requesting refill nifedipine and spironolactone called to [facility]. Refill done on 03/14/16. I tried to return call and home # rang with no answer and no v/m until phone cut off. Will try again later.

## 2016-03-20 NOTE — Telephone Encounter (Signed)
Pt called back and said she spoke with someone at [facility] and could not find refills; advised pt would resend now. Pt voiced understanding.

## 2016-03-20 NOTE — Addendum Note (Signed)
Addended by: Helene Shoe on: 03/20/2016 02:47 PM   Modules accepted: Orders

## 2016-04-02 DIAGNOSIS — M7541 Impingement syndrome of right shoulder: Secondary | ICD-10-CM | POA: Diagnosis not present

## 2016-04-30 ENCOUNTER — Other Ambulatory Visit: Payer: Self-pay

## 2016-04-30 MED ORDER — NEBIVOLOL HCL 5 MG PO TABS: 5.0000 mg | ORAL_TABLET | Freq: Two times a day (BID) | ORAL | 1 refills | Status: DC

## 2016-04-30 MED ORDER — RANITIDINE HCL 150 MG PO CAPS: 150.0000 mg | ORAL_CAPSULE | Freq: Every day | ORAL | 1 refills | Status: DC

## 2016-04-30 NOTE — Telephone Encounter (Signed)
Pt left v/m requesting refill on bystolic and ranitidine to [place]; pt has refills but [facility] said rx expired. Refill done as requested. Pt not at home and per [facility] Mr Dettinger notified done.

## 2016-05-13 ENCOUNTER — Ambulatory Visit (INDEPENDENT_AMBULATORY_CARE_PROVIDER_SITE_OTHER): Payer: Medicare PPO | Admitting: Primary Care

## 2016-05-13 ENCOUNTER — Encounter: Payer: Self-pay | Admitting: Primary Care

## 2016-05-13 VITALS — BP 122/76 | HR 63 | Temp 97.7°F | Ht 64.0 in | Wt 131.8 lb

## 2016-05-13 DIAGNOSIS — B9789 Other viral agents as the cause of diseases classified elsewhere: Secondary | ICD-10-CM

## 2016-05-13 DIAGNOSIS — J069 Acute upper respiratory infection, unspecified: Secondary | ICD-10-CM

## 2016-05-13 DIAGNOSIS — J302 Other seasonal allergic rhinitis: Secondary | ICD-10-CM

## 2016-05-13 NOTE — Progress Notes (Signed)
Pre visit review using our clinic review tool, if applicable. No additional management support is needed unless otherwise documented below in the visit note. 

## 2016-05-13 NOTE — Patient Instructions (Signed)
Your symptoms are representative of a viral/allergy illness which will resolve on its own over time. Our goal is to treat your symptoms in order to aid your body in the healing process and to make you more comfortable.   Try Zyrtec, Claritin, or Allegra for symptoms of itchy eyes, runny nose, drainage.  You may try Robitussin or Delsym for cough.   Ensure you are staying hydrated with water and rest.   It was a pleasure meeting you!   Upper Respiratory Infection, Adult Most upper respiratory infections (URIs) are a viral infection of the air passages leading to the lungs. A URI affects the nose, throat, and upper air passages. The most common type of URI is nasopharyngitis and is typically referred to as "the common cold." URIs run their course and usually go away on their own. Most of the time, a URI does not require medical attention, but sometimes a bacterial infection in the upper airways can follow a viral infection. This is called a secondary infection. Sinus and middle ear infections are common types of secondary upper respiratory infections. Bacterial pneumonia can also complicate a URI. A URI can worsen asthma and chronic obstructive pulmonary disease (COPD). Sometimes, these complications can require emergency medical care and may be life threatening. What are the causes? Almost all URIs are caused by viruses. A virus is a type of germ and can spread from one person to another. What increases the risk? You may be at risk for a URI if:  You smoke.  You have chronic heart or lung disease.  You have a weakened defense (immune) system.  You are very young or very old.  You have nasal allergies or asthma.  You work in crowded or poorly ventilated areas.  You work in health care facilities or schools. What are the signs or symptoms? Symptoms typically develop 2-3 days after you come in contact with a cold virus. Most viral URIs last 7-10 days. However, viral URIs from the  influenza virus (flu virus) can last 14-18 days and are typically more severe. Symptoms may include:  Runny or stuffy (congested) nose.  Sneezing.  Cough.  Sore throat.  Headache.  Fatigue.  Fever.  Loss of appetite.  Pain in your forehead, behind your eyes, and over your cheekbones (sinus pain).  Muscle aches. How is this diagnosed? Your health care provider may diagnose a URI by:  Physical exam.  Tests to check that your symptoms are not due to another condition such as:  Strep throat.  Sinusitis.  Pneumonia.  Asthma. How is this treated? A URI goes away on its own with time. It cannot be cured with medicines, but medicines may be prescribed or recommended to relieve symptoms. Medicines may help:  Reduce your fever.  Reduce your cough.  Relieve nasal congestion. Follow these instructions at home:  Take medicines only as directed by your health care provider.  Gargle warm saltwater or take cough drops to comfort your throat as directed by your health care provider.  Use a warm mist humidifier or inhale steam from a shower to increase air moisture. This may make it easier to breathe.  Drink enough fluid to keep your urine clear or pale yellow.  Eat soups and other clear broths and maintain good nutrition.  Rest as needed.  Return to work when your temperature has returned to normal or as your health care provider advises. You may need to stay home longer to avoid infecting others. You can also use a  face mask and careful hand washing to prevent spread of the virus.  Increase the usage of your inhaler if you have asthma.  Do not use any tobacco products, including cigarettes, chewing tobacco, or electronic cigarettes. If you need help quitting, ask your health care provider. How is this prevented? The best way to protect yourself from getting a cold is to practice good hygiene.  Avoid oral or hand contact with people with cold symptoms.  Wash your  hands often if contact occurs. There is no clear evidence that vitamin C, vitamin E, echinacea, or exercise reduces the chance of developing a cold. However, it is always recommended to get plenty of rest, exercise, and practice good nutrition. Contact a health care provider if:  You are getting worse rather than better.  Your symptoms are not controlled by medicine.  You have chills.  You have worsening shortness of breath.  You have brown or red mucus.  You have yellow or brown nasal discharge.  You have pain in your face, especially when you bend forward.  You have a fever.  You have swollen neck glands.  You have pain while swallowing.  You have white areas in the back of your throat. Get help right away if:  You have severe or persistent:  Headache.  Ear pain.  Sinus pain.  Chest pain.  You have chronic lung disease and any of the following:  Wheezing.  Prolonged cough.  Coughing up blood.  A change in your usual mucus.  You have a stiff neck.  You have changes in your:  Vision.  Hearing.  Thinking.  Mood. This information is not intended to replace advice given to you by your health care provider. Make sure you discuss any questions you have with your health care provider. Document Released: 09/03/2000 Document Revised: 11/11/2015 Document Reviewed: 06/15/2013 Elsevier Interactive Patient Education  2017 [employer].

## 2016-05-13 NOTE — Progress Notes (Signed)
Subjective:    [name], female    DOB: 08/15/1940, 76 y.o.   MRN: [id number]  HPI  Ms. Margaret Black is a 76 year old female with a history of COPD and GERD who presents today with a chief complaint of cough. She also reports chills, tickle to her throat, itchy ears, runny nose, watery eyes, diarrhea. She recently returned from a trip to [place]. She denies productive cough, fevers, nausea, vomiting. She's not taken anything OTC for her symptoms.   Review of Systems  Constitutional: Positive for chills and fatigue. Negative for fever.  HENT: Negative for sinus pressure.        Itchy ears  Eyes: Positive for itching.  Respiratory: Positive for cough. Negative for shortness of breath and wheezing.   Cardiovascular: Negative for chest pain.  Gastrointestinal: Positive for diarrhea. Negative for abdominal pain, nausea and vomiting.       Past Medical History:  Diagnosis Date  . Anxiety    Related to HTN and vision loss  . Central retinal vein occlusion of left eye    legally blind in Left eye  . 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 Collagen vascular disease ([place])    uncharacterized.  has seen rheum.  positive RF, positive serologies for Sjogrens.  C-ANCA/P-ANCA neg, ANA  neg, anti-dsDNA neg, anti-SCL70 neg, anti-centromere Ab neg  . COPD (chronic obstructive pulmonary disease) ([place])    quit smoking 1993  . Diverticulosis   . Dysfunction of eustachian tube   . Fatigue   . GERD (gastroesophageal reflux disease)   . Goiter, unspecified   . HLD (hyperlipidemia)   . HTN (hypertension)    difficult to control, hypertensive urgency hospitalizations, normal urine/plasma metanephrines and catecholamines, renal dopplers without stenosis  . Hyperaldosteronism    serum aldo/renin ration elevated (aldo 23.7, renin <0.15), CT abd 2010 with normal adrenals, dedicated adrenal CT without adrenal adenoma,   . Hypothyroid   . Insomnia    Related to HTN and vision loss  . Irritable bowel syndrome   . Leiomyosarcoma of uterus ([place]) 09/22/2014  . Lung cancer ([place])    s/p L lobectomy 2001  . Lupus   . Osteopenia   . Pulmonary hypertension    echo [facility] 12/2008 with EF >5%, grade II diastolic dysfunction, RSVP >44mHg, ACE level normal.  [place] [facility]- EF 55-60%, mild MR, mild LAE, PASP 25 + RA (no pulm HTN)  . Urge incontinence   . Uterine cancer ([facility]    s/p hysterectomy 2001     Social History   Social History  . Marital status: Married    Spouse name: N/A  . Number of children: 4  . Years of education: N/A   Occupational History  .  Retired   Social History Main Topics  . Smoking status: Former Smoker    Types: Cigarettes    Quit date: 07/04/1991  . Smokeless tobacco: Never Used     Comment: Quit in early 1980's.  . Alcohol use No  . Drug use: No  . Sexual activity: Not on file   Other Topics Concern  . Not on file   Social History Narrative   Married 1962, 4 children, lives in [place] Originally from [place]  Regular exercise-yes   Enjoys playing bridge    Past Surgical History:  Procedure Laterality Date  . ABDOMINAL HYSTERECTOMY  06/1999  . APPENDECTOMY  1958  .  CHOLECYSTECTOMY  1982  . CT ABD W & PELVIS WO CM  2010   neg except diverticulosis  . CT chest  2007   enlarged thyroid with goiter  . LOBECTOMY  04/1999   right lung  . THYROIDECTOMY    . TUBAL LIGATION  1975    Family History  Problem Relation Age of Onset  . Hypertension Mother   . Pneumonia Father   . Alcohol abuse Father     Allergies  Allergen Reactions  . Lactase Diarrhea  . Latex Rash    Current Outpatient Prescriptions on File Prior to Visit  Medication Sig Dispense Refill  . aspirin 81 MG tablet Take 81 mg by mouth daily.    . calcium carbonate (TUMS - DOSED IN MG ELEMENTAL CALCIUM) 500 MG chewable tablet Chew 2 tablets by mouth daily.    Marland Kitchen CALCIUM-VITAMIN D PO Take by mouth daily.    . clobetasol  ointment (TEMOVATE) 0.05 % APPLY TO AFFECTED AREA ON THIGH TWICE DAILY UNTIL CLEAR  1  . enalapril (VASOTEC) 2.5 MG tablet Take 1 tablet (2.5 mg total) by mouth daily.    . fluticasone (CUTIVATE) 0.05 % cream     . levothyroxine (SYNTHROID, LEVOTHROID) 88 MCG tablet Take 1 tablet (88 mcg total) by mouth daily before breakfast. 90 tablet 3  . LORazepam (ATIVAN) 1 MG tablet Take 0.5 tablets (0.5 mg total) by mouth at bedtime. 90 tablet 1  . Multiple Vitamin (MULTIVITAMIN) tablet Take 1 tablet by mouth daily.    . nebivolol (BYSTOLIC) 5 MG tablet Take 1 tablet (5 mg total) by mouth 2 (two) times daily. 180 tablet 1  . NIFEdipine (NIFEDICAL XL) 60 MG 24 hr tablet TAKE 1 TABLET EVERY EVENING 90 tablet 1  . ranitidine (ZANTAC) 150 MG capsule Take 1 capsule (150 mg total) by mouth daily. 90 capsule 1  . spironolactone (ALDACTONE) 25 MG tablet Take 1 tablet (25 mg total) by mouth 2 (two) times daily. 180 tablet 1  . trimethoprim-polymyxin b (POLYTRIM) ophthalmic solution     . vitamin C (ASCORBIC ACID) 500 MG tablet Take 500 mg by mouth daily.     No current facility-administered medications on file prior to visit.     BP 122/76   Pulse 63   Temp 97.7 F (36.5 C) (Oral)   Ht '5\' 4"'$  (1.626 m)   Wt 131 lb 12.8 oz (59.8 kg)   SpO2 99%   BMI 22.62 kg/m    Objective:   Physical Exam  Constitutional: She appears well-nourished. She does not appear ill.  HENT:  Right Ear: Tympanic membrane and ear canal normal.  Left Ear: Tympanic membrane and ear canal normal.  Nose: Mucosal edema present. Right sinus exhibits no maxillary sinus tenderness and no frontal sinus tenderness. Left sinus exhibits no maxillary sinus tenderness and no frontal sinus tenderness.  Mouth/Throat: Oropharynx is clear and moist.  Eyes: Conjunctivae are normal.  Neck: Neck supple.  Cardiovascular: Normal rate and regular rhythm.   Pulmonary/Chest: Effort normal and breath sounds normal. She has no wheezes. She has no rales.    Lymphadenopathy:    She has no cervical adenopathy.  Skin: Skin is warm and dry.          Assessment & Plan:  Viral URI vs Allergic Rhinitis:  Symptoms of both for the past 3 days. Has not tried anything OTC. Exam today with clear lungs, not suspicious for bacterial involvement. Will have her try OTC antihistamine, Delsym/Robitussin.  Fluids,  rest, follow up PRN.  Sheral Flow, NP

## 2016-05-15 ENCOUNTER — Other Ambulatory Visit: Payer: Self-pay | Admitting: Primary Care

## 2016-05-15 ENCOUNTER — Telehealth: Payer: Self-pay

## 2016-05-15 DIAGNOSIS — R05 Cough: Secondary | ICD-10-CM

## 2016-05-15 DIAGNOSIS — R059 Cough, unspecified: Secondary | ICD-10-CM

## 2016-05-15 MED ORDER — HYDROCODONE-HOMATROPINE 5-1.5 MG/5ML PO SYRP: 5.0000 mL | ORAL_SOLUTION | Freq: Three times a day (TID) | ORAL | 0 refills | Status: DC | PRN

## 2016-05-15 NOTE — Telephone Encounter (Signed)
Pt called back and was very concerned that she had not gotten a call back. I reassured her that it has been received and the medical assistant would call her as soon as possible.

## 2016-05-15 NOTE — Telephone Encounter (Signed)
Pt left v/m; pt was seen 05/13/16; pt has been taking robitussin; pt cough is terrible and robitussin does not help and S/T feels like has 2 large balls in throat; pt request cb. [facility].

## 2016-05-15 NOTE — Telephone Encounter (Signed)
Please answer initial questions. Which cough medication would she like? Non drowsy capsules or something with codeine that may make her sleepy?

## 2016-05-15 NOTE — Telephone Encounter (Signed)
Patient stated that the cough is not getting better. Patient stated that she would like the prescription cough medication.

## 2016-05-15 NOTE — Telephone Encounter (Signed)
Any fevers? Is she coughing up anything? Any trouble breathing? I'm happy to provide her with prescription strength cough medication. Has she tried cough medication with codeine before? Is she able to tolerate this?

## 2016-05-15 NOTE — Telephone Encounter (Signed)
Spoke with patient myself, low grade fever this morning which has resolved. Rx for Tussionex printed and placed up front for pick up. She will call Monday if no improvement.

## 2016-05-20 DIAGNOSIS — N183 Chronic kidney disease, stage 3 (moderate): Secondary | ICD-10-CM | POA: Diagnosis not present

## 2016-05-20 DIAGNOSIS — R809 Proteinuria, unspecified: Secondary | ICD-10-CM | POA: Diagnosis not present

## 2016-05-20 DIAGNOSIS — I10 Essential (primary) hypertension: Secondary | ICD-10-CM | POA: Diagnosis not present

## 2016-05-21 ENCOUNTER — Telehealth: Payer: Self-pay | Admitting: *Deleted

## 2016-05-21 NOTE — Telephone Encounter (Signed)
PT called in wanting to check the status of a message she had left with the triage nurse earlier in the day on May 15, 2016. She was very upset that she had not received a return call yet and that it had been hours. I explained to her that it can take 24 hours to get a call back. I also explained to her that her message had been received and sent to a provider as her provider was not in the office and it was sent with a high priority so she should be getting a call back soon. I also informed her the medical assistant was in patient care all day and will call her as soon as she is able. She expressed her frustrations by lashing out and making rude remarks. I explained to her that the medical assistant was on lunch and would be returning shortly and I would make sure that she got a call back as soon as the medical assistant was able to. The patient then proceeded to say in a very negative connotation "I hope she enjoys her lunch". She then proceeded to hang up with me. I spoke with the medical assistant and the provider the message had been sent to explaining what had happened. When the patient came in to pick up her prescription she was very rude and did not want to give me any information in order to pick up her prescription. She kept saying "oh my god" and being very resistant. I tried to explain to her that in order to pick up her prescription I needed to verify her name, date of birth, and get her signature that she was picking the prescription up. She then said, "do you think I'm only do this for fun?". She eventually signed for her prescription and took it from me.

## 2016-05-21 NOTE — Telephone Encounter (Signed)
Patient was seen by Allie Bossier on 05/13/2016. Patient was rude about getting on the scale and getting the rest of vitals. Patient was stated that she had lung cancer and she demanded that she need to be taken care of so she does not get worse.  When spoken to patient on 05/15/2016. I was trying to addressed Kate's comments to patient but she interrupted me each time before I can finish asking her the questions. She kept saying that, "Oh god, why are asking such stupid questions."She stated that she does not have time for this. She became more rude when I inform that she would have to pick up the prescription for the Hycodan at the office.  Patient then proceeded by sayng, at least you had lunch right. I inform patient that no madam, I have not yet since that afternoon I was also working with Dr Deborra Medina. She cuts me off and again stating that this need to be done now. Patient need hangs up the phone.

## 2016-05-22 ENCOUNTER — Telehealth: Payer: Self-pay | Admitting: Family Medicine

## 2016-05-22 NOTE — Telephone Encounter (Signed)
LVM for pt to call back and schedule AWV + labs with Lesia and CPE with PCP. °

## 2016-05-26 NOTE — Telephone Encounter (Signed)
See below. Patient was rude to multiple people in the office.  She has earned the right be discharged. Please dismiss patient.  Thanks.

## 2016-06-10 ENCOUNTER — Encounter: Payer: Self-pay | Admitting: Family Medicine

## 2016-06-12 ENCOUNTER — Encounter: Payer: Self-pay | Admitting: Family Medicine

## 2016-06-12 ENCOUNTER — Ambulatory Visit (INDEPENDENT_AMBULATORY_CARE_PROVIDER_SITE_OTHER): Payer: Medicare PPO | Admitting: Family Medicine

## 2016-06-12 ENCOUNTER — Encounter (INDEPENDENT_AMBULATORY_CARE_PROVIDER_SITE_OTHER): Payer: Self-pay

## 2016-06-12 DIAGNOSIS — R6889 Other general symptoms and signs: Secondary | ICD-10-CM

## 2016-06-12 NOTE — Progress Notes (Signed)
Pre visit review using our clinic review tool, if applicable. No additional management support is needed unless otherwise documented below in the visit note. 

## 2016-06-12 NOTE — Patient Instructions (Addendum)
The "tickle" cough is likely a post infectious cough that should gradually get better in a few weeks.   I would give the numb sensation a few more days to resolve.  It may be reflux related.  Stop the enalapril in the meantime.  I think this is totally unrelated but it would be safer to stop it in the meantime.  Update me in about 1 week.   Due to interaction with staff, you are dismissed from the clinic.  You have a 1 month window for follow up here. You will get a letter in the mail.  Take care.  Glad to see you.

## 2016-06-12 NOTE — Progress Notes (Signed)
She got better after the last OV.  Was back to baseline except she has a dry nonproductive cough, left over from the last URI last month.  She doesn't feel unwell o/w.    She has mult stressors.  Her daughter has had job upheaval.  Her granddaughter recently had surgery.  Unclear if any of that is related, contributing, d/w pt.  She admits to being nervous.    Throat sx.  "It's driving me nuts."  Intermittent numb sensation in the throat and posterior tongue for the last 5-6 days.  Not constant sx.   No FCNAVD.  Some rhinorrhea.  Minimal post nasal gtt per patient report.  She has a dry nonproductive cough, left over from the last URI last month.  No dysphagia.  No lip swelling.    Meds, vitals, and allergies reviewed.   ROS: Per HPI unless specifically indicated in ROS section   GEN: nad, alert and oriented HEENT: mucous membranes moist, nasal exam with slightly clear rhinorrhea, OP wnl, no lip or tongue swelling. No ulceration. NECK: supple w/o LA, no stridor CV: rrr. PULM: ctab, no inc wob, no wheeze EXT: no edema

## 2016-06-13 ENCOUNTER — Telehealth: Payer: Self-pay | Admitting: Family Medicine

## 2016-06-13 DIAGNOSIS — R6889 Other general symptoms and signs: Secondary | ICD-10-CM | POA: Insufficient documentation

## 2016-06-13 NOTE — Telephone Encounter (Signed)
Patient dismissed from [facility] by Elsie Stain MD , effective June 10, 2016. Dismissal letter sent out by certified / registered mail.  DAJ

## 2016-06-13 NOTE — Assessment & Plan Note (Signed)
The "tickle" cough is likely a post infectious cough that should gradually get better in a few weeks.  D/w pt.   This is a likely a separate issue from her chief complaint today.  I would give the numb sensation a few more days to resolve.  It may be reflux related.  Stop the enalapril in the meantime.  I think this is totally unrelated to ACE but it would be safer to stop it in the meantime. I asked her to update me in about 1 week, with routine cautions given.  If she does have true lip or tongue swelling she will need to go to the emergency room immediately.  Previous issues regarding staff interaction discussed with patient. She has earned dismissal from the clinic. A letter will be sent to patient. We will be available to care for her for one month, but she will need to find a new primary care clinic thereafter.

## 2016-06-16 NOTE — Telephone Encounter (Signed)
Pt dismissed from [facility]

## 2016-06-17 ENCOUNTER — Other Ambulatory Visit: Payer: Self-pay | Admitting: *Deleted

## 2016-06-17 NOTE — Telephone Encounter (Signed)
Patient left a voicemail requesting a refill on  Lorazepam-last refill 01/03/16 #90/1 Prevacid -not on medication list Last office visit 06/12/16 Pharmacy-[facility]

## 2016-06-18 MED ORDER — LORAZEPAM 1 MG PO TABS: 0.5000 mg | ORAL_TABLET | Freq: Every day | ORAL | 0 refills | Status: DC

## 2016-06-18 MED ORDER — RANITIDINE HCL 150 MG PO CAPS: 150.0000 mg | ORAL_CAPSULE | Freq: Every day | ORAL | 0 refills | Status: DC

## 2016-06-18 NOTE — Telephone Encounter (Signed)
Spoke to patient and was advised that she is on ranitidine and not prevacid. Patient stated that she was confused and that her husband takes Prevacid. Advised patient that the scripts will be sent in per her request.

## 2016-06-18 NOTE — Telephone Encounter (Signed)
Sent. Thanks.   

## 2016-06-18 NOTE — Telephone Encounter (Signed)
Written script for Lorazepam faxed to [facility] as instructed.

## 2016-06-18 NOTE — Telephone Encounter (Signed)
Please fax in the prescription for lorazepam. I don't see where she has been on Prevacid in years. We verified her med list at the office and this was not mentioned. If she is still taking it, we need an updated med list regarding strength and frequency. Thanks.

## 2016-06-19 NOTE — Telephone Encounter (Signed)
Received signed domestic return receipt verifying delivery of certified letter on June 16, 2016. Article number [id number] [place]

## 2016-06-23 ENCOUNTER — Encounter: Payer: Self-pay | Admitting: Family Medicine

## 2016-07-01 ENCOUNTER — Encounter: Payer: Self-pay | Admitting: *Deleted

## 2016-07-01 ENCOUNTER — Encounter: Payer: Self-pay | Admitting: Nurse Practitioner

## 2016-07-01 ENCOUNTER — Ambulatory Visit (INDEPENDENT_AMBULATORY_CARE_PROVIDER_SITE_OTHER): Payer: Medicare PPO | Admitting: Nurse Practitioner

## 2016-07-01 VITALS — BP 120/49 | HR 60 | Temp 98.1°F | Resp 16 | Ht 65.0 in | Wt 134.2 lb

## 2016-07-01 DIAGNOSIS — Z85850 Personal history of malignant neoplasm of thyroid: Secondary | ICD-10-CM

## 2016-07-01 DIAGNOSIS — I10 Essential (primary) hypertension: Secondary | ICD-10-CM | POA: Diagnosis not present

## 2016-07-01 DIAGNOSIS — Z7689 Persons encountering health services in other specified circumstances: Secondary | ICD-10-CM | POA: Diagnosis not present

## 2016-07-01 NOTE — Patient Instructions (Addendum)
Margaret Black, Thank you for coming in to clinic today.  1. Please schedule an annual physical in about 3 weeks.  Come about 2-3 days prior to your appointment for labs.  2. We will plan to follow you for thyroid, blood pressure, and anything else that arises.  I do want to consider a repeat DEXA bone scan in the next 1 year.  Please schedule a follow-up appointment with Cassell Smiles, AGNP in 3 weeks for your annual physical.  If you have any other questions or concerns, please feel free to call the clinic or send a message through [place]. You may also schedule an earlier appointment if necessary.  Cassell Smiles, DNP, AGNP-BC Adult Gerontology Nurse Practitioner [place]

## 2016-07-01 NOTE — Progress Notes (Signed)
Subjective:    [name], female    DOB: Mar 27, 1940, 76 y.o.   MRN: [id number]  Margaret Black is a 76 y.o. female presenting on 07/01/2016 for Establish Care   HPI  Transferring care from Dr. Damita Dunnings at [facility] because she was "dismissed" and unsure of the reason why.  She states she is "diligent about her health." Requests PCP management of: Thyroid, BP, wellness, and acute illness that may arise.  She sees nephrology and rheumatology on a regular basis for CKD Stage III and Lupus (skin symptoms only), respectively.  Hypertension She has blindness in her L eye r/t occlusion of retinal vein secondary to hypertension.  She had difficulty in the past with BP medications to actually manage her BP.  She notes she has a stable BP on her current med regimen. She requests refills today.  Pt denies headache, lightheadedness, dizziness, chest tightness/pressure, palpitations, further vision changes, sudden loss of speech or loss of consiousness.  Post-surgical Hypothyroidism r/t history of Thyroid Cancer She no longer sees an endocrinologist for management of her hypothyroidism.  Her prior PCP monitored thyroglobulin and TSH/free T4. Her last Endocrinology visit at [facility] was in 2014.  Their last note requests routine measurement of thyroglobulin and Tg Antibody with regular monitoring for TSH.  Defer full thyroid HPI/ROS to next visit for annual physical.  Last monitored 5 months ago and is on an every 6 month follow-up schedule.  History of Uterine and Lung Cancers  long-term follow-up: She has had no long-term follow-up and was not told she needed any.  She states she only received surgery for treatment without chemo or radiation.  Physical Activity: She walks 3-4 miles about every day.  Past Medical History:  Diagnosis Date  . Anxiety    Related to HTN and vision loss  . Central retinal vein occlusion of left eye    legally blind in Left eye  .  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 Collagen vascular disease ([place])    uncharacterized.  has seen rheum.  positive RF, positive serologies for Sjogrens.  C-ANCA/P-ANCA neg, ANA  neg, anti-dsDNA neg, anti-SCL70 neg, anti-centromere Ab neg  . COPD (chronic obstructive pulmonary disease) ([place])    quit smoking 1993  . Diverticulosis   . Dysfunction of eustachian tube   . Fatigue   . GERD (gastroesophageal reflux disease)   . Goiter, unspecified   . HLD (hyperlipidemia)   . HTN (hypertension)    difficult to control, hypertensive urgency hospitalizations, normal urine/plasma metanephrines and catecholamines, renal dopplers without stenosis  . Hyperaldosteronism    serum aldo/renin ration elevated (aldo 23.7, renin <0.15), CT abd 2010 with normal adrenals, dedicated adrenal CT without adrenal adenoma,   . Hypothyroid   . Insomnia    Related to HTN and vision loss  . Irritable bowel syndrome   . Leiomyosarcoma of uterus ([place]) 09/22/2014  . Lung cancer ([place])    s/p L lobectomy 2001  . Lupus   . Osteopenia   . Pulmonary hypertension    echo [facility] 12/2008 with EF >5%, grade II diastolic dysfunction, RSVP >83mHg, ACE level normal.  f/u echo at [facility]- EF 55-60%, mild MR, mild LAE, PASP 25 + RA (no pulm HTN)  . Urge incontinence   . Uterine cancer ([facility]    s/p hysterectomy 2001   Past Surgical History:  Procedure Laterality Date  . ABDOMINAL HYSTERECTOMY  06/1999  .  APPENDECTOMY  1958  . CHOLECYSTECTOMY  1982  . CT ABD W & PELVIS WO CM  2010   neg except diverticulosis  . CT chest  2007   enlarged thyroid with goiter  . LOBECTOMY  04/1999   right lung  . THYROIDECTOMY    . TUBAL LIGATION  1975   Social History   Social History  . Marital status: Married    Spouse name: N/A  . Number of children: 4  . Years of education: N/A   Occupational History  .  Retired   Social History Main Topics  . Smoking status: Former Smoker     Types: Cigarettes    Quit date: 07/04/1991  . Smokeless tobacco: Never Used     Comment: Quit in early 1980's.  . Alcohol use No  . Drug use: No  . Sexual activity: Yes    Birth control/ protection: None, Post-menopausal   Other Topics Concern  . Not on file   Social History Narrative   Married 1962, 4 children, lives in [place]. Originally from [place]   Regular exercise-yes   Enjoys playing bridge   Family History  Problem Relation Age of Onset  . Hypertension Mother   . Pneumonia Father   . Alcohol abuse Father    Current Outpatient Prescriptions on File Prior to Visit  Medication Sig  . aspirin 81 MG tablet Take 81 mg by mouth daily.  . calcium carbonate (TUMS - DOSED IN MG ELEMENTAL CALCIUM) 500 MG chewable tablet Chew 2 tablets by mouth daily.  Marland Kitchen CALCIUM-VITAMIN D PO Take by mouth daily.  . clobetasol ointment (TEMOVATE) 0.05 % APPLY TO AFFECTED AREA ON THIGH TWICE DAILY UNTIL CLEAR  . fluticasone (CUTIVATE) 0.05 % cream   . levothyroxine (SYNTHROID, LEVOTHROID) 88 MCG tablet Take 1 tablet (88 mcg total) by mouth daily before breakfast.  . LORazepam (ATIVAN) 1 MG tablet Take 0.5 tablets (0.5 mg total) by mouth at bedtime.  . Multiple Vitamin (MULTIVITAMIN) tablet Take 1 tablet by mouth daily.  . nebivolol (BYSTOLIC) 5 MG tablet Take 1 tablet (5 mg total) by mouth 2 (two) times daily.  Marland Kitchen NIFEdipine (NIFEDICAL XL) 60 MG 24 hr tablet TAKE 1 TABLET EVERY EVENING  . ranitidine (ZANTAC) 150 MG capsule Take 1 capsule (150 mg total) by mouth daily.  Marland Kitchen spironolactone (ALDACTONE) 25 MG tablet Take 1 tablet (25 mg total) by mouth 2 (two) times daily.  . vitamin C (ASCORBIC ACID) 500 MG tablet Take 500 mg by mouth daily.   No current facility-administered medications on file prior to visit.     Review of Systems  Constitutional: Negative.   HENT: Negative.   Eyes: Negative.   Respiratory: Negative.   Cardiovascular: Negative.   Gastrointestinal: Negative.   Endocrine:  Negative.   Genitourinary: Negative.   Musculoskeletal: Negative.   Skin: Negative.   Allergic/Immunologic: Negative.   Neurological: Negative.   Hematological: Negative.   Psychiatric/Behavioral: Negative.    Per HPI unless specifically indicated above     Objective:    BP (!) 120/49   Pulse 60   Temp 98.1 F (36.7 C) (Oral)   Resp 16   Ht '5\' 5"'$  (1.651 m)   Wt 134 lb 3.2 oz (60.9 kg)   BMI 22.33 kg/m   Wt Readings from Last 3 Encounters:  07/01/16 134 lb 3.2 oz (60.9 kg)  06/12/16 133 lb 8 oz (60.6 kg)  05/13/16 131 lb 12.8 oz (59.8 kg)    Physical Exam  Constitutional: She is oriented to person, place, and time. She appears well-developed and well-nourished.  HENT:  Head: Normocephalic and atraumatic.  Neck: Normal range of motion. No JVD present. No tracheal deviation present.  Absent thyroid, no remnant thyroid palpated  Cardiovascular: Normal rate, regular rhythm, normal heart sounds and intact distal pulses.   Pulmonary/Chest: Effort normal and breath sounds normal. No respiratory distress. She has no wheezes.  Musculoskeletal: She exhibits no edema.  Normal gait  Lymphadenopathy:    She has no cervical adenopathy.  Neurological: She is alert and oriented to person, place, and time.  Skin: Skin is warm and dry.  Psychiatric: She has a normal mood and affect. Her behavior is normal. Judgment and thought content normal.    Results for orders placed or performed in visit on 01/25/16  TSH  Result Value Ref Range   TSH 1.02 0.35 - 4.50 uIU/mL      Assessment & Plan:   Problem List Items Addressed This Visit      Cardiovascular and Mediastinum   Essential hypertension Stable BP in normal range today.  1. Continue current medications. - Take enalapril 2.5 mg tablet once daily - Take nebivolol '5mg'$  twice daily - take nifedipine 60 mg once daily    Relevant Medications   enalapril (VASOTEC) 2.5 MG tablet     Other   History of thyroid cancer Per  patient. Stable. No more follow up necessary with surgery.  PCP has been monitoring her TSH and Tg and TgAb.  1. Check TSH, Tg, TgAb. 2. Continue levothyroxine '88mg'$  tablet once daily.   Relevant Orders   Thyroglobulin antibody   TSH T4, Free   Thyroglobulin Level    Other Visit Diagnoses    Establishing care with new doctor, encounter for    -  Primary      Meds ordered this encounter  Medications  . enalapril (VASOTEC) 2.5 MG tablet      Follow up plan: Return in about 3 weeks (around 07/22/2016) for annual physical and labs 2-3 days prior.Cassell Smiles, DNP, AGPCNP-BC Adult Gerontology Primary Care Nurse Practitioner [facility] 07/04/2016, 9:13 AM

## 2016-07-02 DIAGNOSIS — D2271 Melanocytic nevi of right lower limb, including hip: Secondary | ICD-10-CM | POA: Diagnosis not present

## 2016-07-02 DIAGNOSIS — R809 Proteinuria, unspecified: Secondary | ICD-10-CM | POA: Diagnosis not present

## 2016-07-02 DIAGNOSIS — I10 Essential (primary) hypertension: Secondary | ICD-10-CM | POA: Diagnosis not present

## 2016-07-02 DIAGNOSIS — L708 Other acne: Secondary | ICD-10-CM | POA: Diagnosis not present

## 2016-07-02 DIAGNOSIS — N183 Chronic kidney disease, stage 3 (moderate): Secondary | ICD-10-CM | POA: Diagnosis not present

## 2016-07-02 DIAGNOSIS — D485 Neoplasm of uncertain behavior of skin: Secondary | ICD-10-CM | POA: Diagnosis not present

## 2016-07-04 ENCOUNTER — Other Ambulatory Visit: Payer: Self-pay | Admitting: Nurse Practitioner

## 2016-07-04 DIAGNOSIS — Z0000 Encounter for general adult medical examination without abnormal findings: Secondary | ICD-10-CM

## 2016-07-04 DIAGNOSIS — I10 Essential (primary) hypertension: Secondary | ICD-10-CM

## 2016-07-05 NOTE — Progress Notes (Signed)
I have reviewed this encounter including the documentation in this note and/or discussed this patient with the provider, Cassell Smiles, AGPCNP-BC. I am certifying that I agree with the content of this note as supervising physician.  [name], [facility] 07/05/2016, 7:58 AM

## 2016-07-10 DIAGNOSIS — H348122 Central retinal vein occlusion, left eye, stable: Secondary | ICD-10-CM | POA: Diagnosis not present

## 2016-07-10 DIAGNOSIS — M3219 Other organ or system involvement in systemic lupus erythematosus: Secondary | ICD-10-CM | POA: Diagnosis not present

## 2016-07-10 DIAGNOSIS — H35352 Cystoid macular degeneration, left eye: Secondary | ICD-10-CM | POA: Diagnosis not present

## 2016-07-10 DIAGNOSIS — H43812 Vitreous degeneration, left eye: Secondary | ICD-10-CM | POA: Diagnosis not present

## 2016-07-10 DIAGNOSIS — H3582 Retinal ischemia: Secondary | ICD-10-CM | POA: Diagnosis not present

## 2016-07-22 ENCOUNTER — Other Ambulatory Visit: Payer: Medicare PPO

## 2016-07-24 ENCOUNTER — Other Ambulatory Visit: Payer: Medicare PPO

## 2016-07-25 ENCOUNTER — Other Ambulatory Visit: Payer: Medicare PPO

## 2016-07-25 ENCOUNTER — Other Ambulatory Visit: Payer: Self-pay | Admitting: Nurse Practitioner

## 2016-07-25 DIAGNOSIS — I10 Essential (primary) hypertension: Secondary | ICD-10-CM | POA: Diagnosis not present

## 2016-07-25 DIAGNOSIS — Z0000 Encounter for general adult medical examination without abnormal findings: Secondary | ICD-10-CM | POA: Diagnosis not present

## 2016-07-25 DIAGNOSIS — Z85850 Personal history of malignant neoplasm of thyroid: Secondary | ICD-10-CM | POA: Diagnosis not present

## 2016-07-25 LAB — CBC
HCT: 37.8 % (ref 35.0–45.0)
Hemoglobin: 12.3 g/dL (ref 11.7–15.5)
MCH: 31.9 pg (ref 27.0–33.0)
MCHC: 32.5 g/dL (ref 32.0–36.0)
MCV: 98.2 fL (ref 80.0–100.0)
MPV: 9.4 fL (ref 7.5–12.5)
Platelets: 303 10*3/uL (ref 140–400)
RBC: 3.85 MIL/uL (ref 3.80–5.10)
RDW: 13.8 % (ref 11.0–15.0)
WBC: 5 10*3/uL (ref 3.8–10.8)

## 2016-07-26 LAB — COMPLETE METABOLIC PANEL WITH GFR
ALT: 15 U/L (ref 6–29)
AST: 18 U/L (ref 10–35)
Albumin: 4.3 g/dL (ref 3.6–5.1)
Alkaline Phosphatase: 77 U/L (ref 33–130)
BUN: 30 mg/dL — ABNORMAL HIGH (ref 7–25)
CO2: 26 mmol/L (ref 20–31)
Calcium: 9.9 mg/dL (ref 8.6–10.4)
Chloride: 104 mmol/L (ref 98–110)
Creat: 1.33 mg/dL — ABNORMAL HIGH (ref 0.60–0.93)
GFR, Est African American: 45 mL/min — ABNORMAL LOW (ref >=60)
GFR, Est Non African American: 39 mL/min — ABNORMAL LOW (ref >=60)
Glucose, Bld: 89 mg/dL (ref 65–99)
Potassium: 4.4 mmol/L (ref 3.5–5.3)
Sodium: 140 mmol/L (ref 135–146)
Total Bilirubin: 0.8 mg/dL (ref 0.2–1.2)
Total Protein: 7.2 g/dL (ref 6.1–8.1)

## 2016-07-26 LAB — LIPID PANEL
Cholesterol: 179 mg/dL (ref <200)
HDL: 50 mg/dL — ABNORMAL LOW (ref >50)
LDL Cholesterol: 108 mg/dL — ABNORMAL HIGH (ref <100)
Total CHOL/HDL Ratio: 3.6 Ratio (ref <5.0)
Triglycerides: 105 mg/dL (ref <150)
VLDL: 21 mg/dL (ref <30)

## 2016-07-26 LAB — TSH: TSH: 1.01 mIU/L

## 2016-07-26 LAB — T4, FREE: Free T4: 1.7 ng/dL (ref 0.8–1.8)

## 2016-07-28 LAB — THYROGLOBULIN LEVEL: Thyroglobulin: <0.1 ng/mL — ABNORMAL LOW

## 2016-07-28 LAB — THYROGLOBULIN ANTIBODY: Thyroglobulin Ab: 3 IU/mL — ABNORMAL HIGH (ref <2)

## 2016-07-29 ENCOUNTER — Encounter: Payer: Self-pay | Admitting: Nurse Practitioner

## 2016-07-29 ENCOUNTER — Telehealth: Payer: Self-pay

## 2016-07-29 ENCOUNTER — Ambulatory Visit (INDEPENDENT_AMBULATORY_CARE_PROVIDER_SITE_OTHER): Payer: Medicare PPO | Admitting: Nurse Practitioner

## 2016-07-29 VITALS — BP 121/39 | HR 54 | Temp 97.7°F | Resp 16 | Ht 65.0 in | Wt 137.4 lb

## 2016-07-29 DIAGNOSIS — R918 Other nonspecific abnormal finding of lung field: Secondary | ICD-10-CM

## 2016-07-29 DIAGNOSIS — L298 Other pruritus: Secondary | ICD-10-CM

## 2016-07-29 DIAGNOSIS — M8589 Other specified disorders of bone density and structure, multiple sites: Secondary | ICD-10-CM | POA: Diagnosis not present

## 2016-07-29 DIAGNOSIS — K219 Gastro-esophageal reflux disease without esophagitis: Secondary | ICD-10-CM

## 2016-07-29 DIAGNOSIS — J439 Emphysema, unspecified: Secondary | ICD-10-CM

## 2016-07-29 DIAGNOSIS — Z23 Encounter for immunization: Secondary | ICD-10-CM

## 2016-07-29 DIAGNOSIS — I10 Essential (primary) hypertension: Secondary | ICD-10-CM | POA: Diagnosis not present

## 2016-07-29 DIAGNOSIS — Z0000 Encounter for general adult medical examination without abnormal findings: Secondary | ICD-10-CM

## 2016-07-29 DIAGNOSIS — N898 Other specified noninflammatory disorders of vagina: Secondary | ICD-10-CM

## 2016-07-29 DIAGNOSIS — N952 Postmenopausal atrophic vaginitis: Secondary | ICD-10-CM | POA: Diagnosis not present

## 2016-07-29 MED ORDER — RANITIDINE HCL 150 MG PO CAPS: 150.0000 mg | ORAL_CAPSULE | Freq: Every day | ORAL | 3 refills | Status: DC

## 2016-07-29 MED ORDER — ESTROGENS, CONJUGATED 0.625 MG/GM VA CREA: 1.0000 | TOPICAL_CREAM | Freq: Every day | VAGINAL | 12 refills | Status: DC | PRN

## 2016-07-29 MED ORDER — NEBIVOLOL HCL 5 MG PO TABS: 2.5000 mg | ORAL_TABLET | Freq: Every day | ORAL | 2 refills | Status: DC

## 2016-07-29 MED ORDER — ALBUTEROL SULFATE HFA 108 (90 BASE) MCG/ACT IN AERS: 1.0000 | INHALATION_SPRAY | Freq: Four times a day (QID) | RESPIRATORY_TRACT | 2 refills | Status: DC | PRN

## 2016-07-29 NOTE — Telephone Encounter (Signed)
-----   Message from Mikey College, NP sent at 07/28/2016  2:17 PM EDT ----- Thyroglobulin and Thyroglobulin antibody show no evidence of thyroid tissue growth. This is good and indicates that there is likely no return of your thyroid cancer.  TSH and Free T4 show that you current dose of levothyroxine is effective.  Continue taking levothyroxine 88 mcg daily.  CMP: liver function and electrolytes. Your kidney function is decreased, but it is not worsened from prior values.  Continue followup with your kidney doctor  CBC: Your blood counts are all normal.  Lipid panel: Your HDL is consistent with 1 year ago.  Your HDL is still low, but it has improved over last year.  HDL is your good cholesterol.  When it is higher than 50, it can be protective against and prevent cholesterol buildup in your arteries that could lead to heart attack or stroke.  You can increase your HDL by exercising, by taking fish oil 1,000 mg daily or by increasing your dietary omega-3 fatty acids.  Omega-3s are found in olive oil, fish, and seeds and nuts like almonds, walnuts, and pecans.

## 2016-07-29 NOTE — Progress Notes (Signed)
Subjective:    [name], female    DOB: Jul 30, 1940, 76 y.o.   MRN: [id number]  Margaret Black is a 76 y.o. female presenting on 07/29/2016 for Annual Exam and also raises concerns about her BP and vaginal itching.  HPI Hypertension Over the last 2 days, she has felt lightheaded and dizzy. She notes her DBP was low in clinic today and thinks this may be the reason for her symptoms.  She has felt like she may fall when lightheaded. Home BP readings range over the last few days: 112-114/60 She exercises regularly  Difficulty breathing with exertion She has COPD and a history of lung CA with R lung lobectomy.  She has no difficulty with walking on flat surfaces but notices an increased work of breathing with exertion if on an incline or walking on sand at the beach.  She requests if there is anything that should be done.  She did have CT scan with pulmonary nodules in January and June 2016.  There were no significant changes noted in nodules over that time frame, but it was recommended she have them monitored for changes around June 2017.  She has not had any repeat CT scanning or surveillance.  Vaginal itching After her last visit, she thought she had a yeast infection r/t itching and used Monistat for 3 days and it helped her symptoms briefly. No discharge noted.  She is not currently sexually active and has not had prior difficulty with vaginal dryness.  Annual Physical Patient has been feeling well except for her lightheadedness and vaginal.  She has no other acute concerns today. Sleeps 8.5-9 hours per night with 3 episodes of nocturia (spironolactone) with lorazepam 0.5 mg.  Cat nap in afternoon.  No Shob with level walking.   Supplemental calcium 1 tablet, and couple tums, and multivit.    HEALTH MAINTENANCE: Weight/BMI: healthy weight Physical activity: Walking daily 4 miles Diet: Healthy with few fats r/t GI Seatbelt: always Sunscreen: always for exposed skin r/t  lupus PAP: last after 65, now total hysterectomy with oophrectomy Colonoscopy: no abnormal Tetanus: Cut on skin with a shell on the beach Saturday last week - cut is still healing  CT scan: diagnostic for lung cancer - Smoking history of > 30 pack-year history recommended for repeat in 2017 DEXA: last bone density scan 2014 indicates osteopenia w/ FRAX score:    Past Medical History:  Diagnosis Date  . Anxiety    Related to HTN and vision loss  . Central retinal vein occlusion of left eye    legally blind in Left eye  . 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 Collagen vascular disease ([place])    uncharacterized.  has seen rheum.  positive RF, positive serologies for Sjogrens.  C-ANCA/P-ANCA neg, ANA  neg, anti-dsDNA neg, anti-SCL70 neg, anti-centromere Ab neg  . COPD (chronic obstructive pulmonary disease) ([place])    quit smoking 1993  . Diverticulosis   . Dysfunction of eustachian tube   . Fatigue   . GERD (gastroesophageal reflux disease)   . Goiter, unspecified   . HLD (hyperlipidemia)   . HTN (hypertension)    difficult to control, hypertensive urgency hospitalizations, normal urine/plasma metanephrines and catecholamines, renal dopplers without stenosis  . Hyperaldosteronism    serum aldo/renin ration elevated (aldo 23.7, renin <0.15), CT abd 2010 with normal adrenals, dedicated adrenal CT without adrenal adenoma,   . Hypothyroid   . Insomnia  Related to HTN and vision loss  . Irritable bowel syndrome   . Leiomyosarcoma of uterus ([place]) 09/22/2014  . Lung cancer ([place])    s/p L lobectomy 2001  . Lupus   . Osteopenia   . Pulmonary hypertension ([place])    echo [facility] 12/2008 with EF >5%, grade II diastolic dysfunction, RSVP >74mHg, ACE level normal.  f/u echo at [facility]- EF 55-60%, mild MR, mild LAE, PASP 25 + RA (no pulm HTN)  . Urge incontinence   . Uterine cancer ([facility]    s/p hysterectomy 2001   Past Surgical History:  Procedure  Laterality Date  . ABDOMINAL HYSTERECTOMY  06/1999  . APPENDECTOMY  1958  . CHOLECYSTECTOMY  1982  . CT ABD W & PELVIS WO CM  2010   neg except diverticulosis  . CT chest  2007   enlarged thyroid with goiter  . LOBECTOMY  04/1999   right lung  . THYROIDECTOMY    . TUBAL LIGATION  1975   Social History   Social History  . Marital status: Married    Spouse name: N/A  . Number of children: 4  . Years of education: N/A   Occupational History  .  Retired   Social History Main Topics  . Smoking status: Former Smoker    Types: Cigarettes    Quit date: 07/04/1991  . Smokeless tobacco: Never Used     Comment: Quit in early 1980's.  . Alcohol use No  . Drug use: No  . Sexual activity: Yes    Birth control/ protection: None, Post-menopausal   Other Topics Concern  . Not on file   Social History Narrative   Married 1962, 4 children, [place]  Regular exercise-yes   Enjoys playing bridge   Family History  Problem Relation Age of Onset  . Hypertension Mother   . Pneumonia Father   . Alcohol abuse Father    Current Outpatient Prescriptions on File Prior to Visit  Medication Sig  . aspirin 81 MG tablet Take 81 mg by mouth daily.  . calcium carbonate (TUMS - DOSED IN MG ELEMENTAL CALCIUM) 500 MG chewable tablet Chew 2 tablets by mouth daily.  .Marland KitchenCALCIUM-VITAMIN D PO Take by mouth daily.  . clobetasol ointment (TEMOVATE) 0.05 % APPLY TO AFFECTED AREA ON THIGH TWICE DAILY UNTIL CLEAR  . enalapril (VASOTEC) 2.5 MG tablet   . fluticasone (CUTIVATE) 0.05 % cream   . levothyroxine (SYNTHROID, LEVOTHROID) 88 MCG tablet Take 1 tablet (88 mcg total) by mouth daily before breakfast.  . LORazepam (ATIVAN) 1 MG tablet Take 0.5 tablets (0.5 mg total) by mouth at bedtime.  . Multiple Vitamin (MULTIVITAMIN) tablet Take 1 tablet by mouth daily.  . nebivolol (BYSTOLIC) 5 MG tablet Take 1 tablet (5 mg total) by mouth 2 (two) times daily.  .Marland KitchenNIFEdipine  (NIFEDICAL XL) 60 MG 24 hr tablet TAKE 1 TABLET EVERY EVENING  . ranitidine (ZANTAC) 150 MG capsule Take 1 capsule (150 mg total) by mouth daily.  .Marland Kitchenspironolactone (ALDACTONE) 25 MG tablet Take 1 tablet (25 mg total) by mouth 2 (two) times daily.  . vitamin C (ASCORBIC ACID) 500 MG tablet Take 500 mg by mouth daily.   No current facility-administered medications on file prior to visit.     Review of Systems Per HPI unless specifically indicated above     Objective:    BP (!) 121/39 (BP Location: Right Arm, Patient Position: Sitting, Cuff Size: Normal)  Pulse (!) 54   Temp 97.7 F (36.5 C) (Oral)   Resp 16   Ht '5\' 5"'$  (1.651 m)   Wt 137 lb 6.4 oz (62.3 kg)   SpO2 100%   BMI 22.86 kg/m   Wt Readings from Last 3 Encounters:  07/29/16 137 lb 6.4 oz (62.3 kg)  07/01/16 134 lb 3.2 oz (60.9 kg)  06/12/16 133 lb 8 oz (60.6 kg)    Physical Exam  Constitutional: She is oriented to person, place, and time. She appears well-developed and well-nourished. No distress.  HENT:  Head: Normocephalic and atraumatic.  Right Ear: Tympanic membrane, external ear and ear canal normal.  Left Ear: Tympanic membrane, external ear and ear canal normal.  Nose: Nose normal.  Mouth/Throat: Uvula is midline, oropharynx is clear and moist and mucous membranes are normal.  Eyes: Conjunctivae are normal. Pupils are equal, round, and reactive to light.  Neck: Normal range of motion. Neck supple. No JVD present. No tracheal deviation present. No thyromegaly present.  Cardiovascular: Normal rate, regular rhythm, normal heart sounds and intact distal pulses.   Pulmonary/Chest: Effort normal and breath sounds normal. No respiratory distress. She has no wheezes.  Abdominal: Soft. Bowel sounds are normal. She exhibits no distension and no mass. There is no tenderness.  Genitourinary: No labial fusion. There is no rash, tenderness, lesion or injury on the right labia. There is no rash, tenderness, lesion or injury  on the left labia. No erythema, tenderness or bleeding in the vagina. No foreign body in the vagina. No signs of injury around the vagina. No vaginal discharge found.  Genitourinary Comments: Atrophy of labia and vaginal tissue noted  Musculoskeletal: Normal range of motion.  Lymphadenopathy:    She has no cervical adenopathy.  Neurological: She is alert and oriented to person, place, and time. She has normal reflexes. No cranial nerve deficit.  Skin: Skin is warm and dry.  Psychiatric: She has a normal mood and affect. Her behavior is normal. Judgment and thought content normal.   Results for orders placed or performed in visit on 07/25/16  COMPLETE METABOLIC PANEL WITH GFR  Result Value Ref Range   Sodium 140 135 - 146 mmol/L   Potassium 4.4 3.5 - 5.3 mmol/L   Chloride 104 98 - 110 mmol/L   CO2 26 20 - 31 mmol/L   Glucose, Bld 89 65 - 99 mg/dL   BUN 30 (H) 7 - 25 mg/dL   Creat 1.33 (H) 0.60 - 0.93 mg/dL   Total Bilirubin 0.8 0.2 - 1.2 mg/dL   Alkaline Phosphatase 77 33 - 130 U/L   AST 18 10 - 35 U/L   ALT 15 6 - 29 U/L   Total Protein 7.2 6.1 - 8.1 g/dL   Albumin 4.3 3.6 - 5.1 g/dL   Calcium 9.9 8.6 - 10.4 mg/dL   GFR, Est African American 45 (L) >=60 mL/min   GFR, Est Non African American 39 (L) >=60 mL/min  CBC  Result Value Ref Range   WBC 5.0 3.8 - 10.8 K/uL   RBC 3.85 3.80 - 5.10 MIL/uL   Hemoglobin 12.3 11.7 - 15.5 g/dL   HCT 37.8 35.0 - 45.0 %   MCV 98.2 80.0 - 100.0 fL   MCH 31.9 27.0 - 33.0 pg   MCHC 32.5 32.0 - 36.0 g/dL   RDW 13.8 11.0 - 15.0 %   Platelets 303 140 - 400 K/uL   MPV 9.4 7.5 - 12.5 fL  Lipid panel  Result  Value Ref Range   Cholesterol 179 <200 mg/dL   Triglycerides 105 <150 mg/dL   HDL 50 (L) >50 mg/dL   Total CHOL/HDL Ratio 3.6 <5.0 Ratio   VLDL 21 <30 mg/dL   LDL Cholesterol 108 (H) <100 mg/dL  TSH  Result Value Ref Range   TSH 1.01 mIU/L  T4, free  Result Value Ref Range   Free T4 1.7 0.8 - 1.8 ng/dL  Thyroglobulin antibody  Result  Value Ref Range   Thyroglobulin Ab 3 (H) <2 IU/mL  Thyroglobulin Level  Result Value Ref Range   Thyroglobulin <0.1 (L) ng/mL   Most recent Bone Density scan: 04/27/2012 Baseline 2007: spine L1-4 -> BMD 0.879            Femur     -> BMD 0.824  2012: spine L1-4 -> BMD 0.838 (% change -2.3)  Femur     -> BMD 0.779 (% change -11.6)      2014: spine L1-4 -> BMD 0.858 (% change +2.4)    Z score 0.5     T score -1.7  Femur     -> BMD 0.728 (% change - 6.6)    Z score -0.2    T score -1.8      Femoral neck -> BMD 0.595  T score -2.3     Assessment & Plan:   Problem List Items Addressed This Visit      Cardiovascular and Mediastinum   Essential hypertension Controlled. Symptomatic lightheadedness/dizziness w/ decreased stability that could lead to fall.  Plan: 1. Decrease nebivolol to 1/2 tablet twice daily for total dose of 5 mg daily.   2. Monitor BP and symptoms daily with this dose adjustment and resume taking 1 tablet twice daily for total dose of 10 mg daily if BP increases to greater than 130/90.   Relevant Medications   nebivolol (BYSTOLIC) 5 MG tablet     Respiratory   COPD (chronic obstructive pulmonary disease) (HCC) Worsening severity with exertion.  Stable at rest and symptoms resolve with rest.  Plan: 1. Start albuterol inhaler as needed for exertional shortness of breath. 2. Assess other possible causes for symptoms.   Relevant Medications   albuterol (PROVENTIL HFA;VENTOLIN HFA) 108 (90 Base) MCG/ACT inhaler     Digestive   GERD Stable on ranitidine.  Pt desires refill.  Plan: 1. Continue ranitidine once daily.  Discussed side effects and risk of further kidney damage on this dose.  Pt desires to continue at current dosage.   Relevant Medications   ranitidine (ZANTAC) 150 MG capsule     Musculoskeletal and Integument   Osteopenia Previously worsening femoral osteopenia on serial DEXA scanning.  No history of recent fractures.  Plan: 1. Repeat DEXA  scan. 2. Continue calcium supplementation.  Currently consuming adequate dietary calcium, using one calcium w/ vitamin D supplement, and 2 tums daily. 3. Discussed importance of regular upper and lower body weight bearing exercise.   Relevant Orders   DG Bone Density    Other Visit Diagnoses    Annual physical exam    -  Primary Well exam reveals no new problems requiring additional workup.  Plan: 1. Follow up in 1 year for annual physical. 2. Schedule annual medicare wellness.  Described to patient that this is different from today's visit. 3. Tetanus vaccine needs to be updated.     Encounter for vaccine Pt with last tetanus vaccine > 10 years ago and recent compromise of skin integrity.  Plan: 1. Administer  tetanus vaccine.  Patient left clinic without receiving this.  Order for [place] only office visit within next 1 week.  Vaginal itching       Vaginal atrophy     Patient with recent symptoms of vaginal itching and temporary relief with monistat. Exam without discharge, signs of vaginal atrophy. Pt not currently having sexual intercourse but reports it had become painful when she was active in remote past.  Plan: 1. Use vaginal moisturizer Replens or similar 3 times daily or as needed for vaginal itching without discharge. 2. Consider premarin vaginal cream for relief of symptoms in future.     Pulmonary nodules     Previously stable on last scans.  New worsening of shortness of breath with exertion greater than walking on level ground.  Plan: 1. Repeat CT Chest w/o contrast as recommended at last CT scan in 2016 (expected around June 2017). 2. Mutual decision making discussion took > 10 minutes.  Discussed desire for treatment if scan found changes.  Pt initially stated would not seek treatment, but later decided she should consider it as she has assumed a greater responsibility to care for her husband who needs increasing assistance at home r/t diseases/disabiliyt undisclosed.   Pt and provider agree to proceed with imaging for evaluation of nodules.   Relevant Orders   CT CHEST WO CONTRAST      Meds ordered this encounter  Medications  . nebivolol (BYSTOLIC) 5 MG tablet    Sig: Take 0.5 tablets (2.5 mg total) by mouth daily.    Dispense:  45 tablet    Refill:  2    Order Specific Question:   Supervising Provider    Answer:   Olin Hauser [[id number]]  . ranitidine (ZANTAC) 150 MG capsule    Sig: Take 1 capsule (150 mg total) by mouth daily.    Dispense:  90 capsule    Refill:  3    Order Specific Question:   Supervising Provider    Answer:   Olin Hauser [[id number]]  . albuterol (PROVENTIL HFA;VENTOLIN HFA) 108 (90 Base) MCG/ACT inhaler    Sig: Inhale 1-2 puffs into the lungs every 6 (six) hours as needed for shortness of breath.    Dispense:  1 Inhaler    Refill:  2    Order Specific Question:   Supervising Provider    Answer:   Olin Hauser [[id number]]  . DISCONTD: conjugated estrogens (PREMARIN) vaginal cream    Sig: Place 1 Applicatorful vaginally daily as needed. For vaginal itching    Dispense:  30 g    Refill:  12    Order Specific Question:   Supervising Provider    Answer:   Olin Hauser [[id number]]      Follow up plan: Return in about 1 month (around 08/29/2016) for medicare wellness and 3 months for BP, COPD, GERD.  Cassell Smiles, DNP, AGPCNP-BC Adult Gerontology Primary Care Nurse Practitioner [facility] 07/29/2016, 2:09 PM

## 2016-07-29 NOTE — Telephone Encounter (Signed)
Will go over labs today at 2 pm. sd

## 2016-07-29 NOTE — Patient Instructions (Signed)
Margaret Black, Thank you for coming in to clinic today.  1. For your vaginal itching - Use premarin cream once daily for at least 7 days.  Then use as needed.  2. For your shortness of breath - Use albuterol inhaler 1-2 puffs every 6 hours as needed - This can raise your HR and blood pressure.  Use as little as needed.  3. For your blood pressure and dizziness: - Only take 1/2 tablet of the Bistolic.  This will be 2.5 mg once daily.  Your new bottle will be the correct description.  4. When we do your refill for lorazepam, let us know to do the smaller pill.  Please schedule a follow-up appointment with Cassell Smiles, AGNP to Return in about 1 month (around 08/29/2016) for medicare wellness and 3 months for BP, COPD, GERD.    Atrophic Vaginitis Atrophic vaginitis is a condition in which the tissues that line the vagina become dry and thin. This condition is most common in women who have stopped having regular menstrual periods (menopause). This usually starts when a woman is 22-20 years old. Estrogen helps to keep the vagina moist. It stimulates the vagina to produce a clear fluid that lubricates the vagina for sexual intercourse. This fluid also protects the vagina from infection. Lack of estrogen can cause the lining of the vagina to get thinner and dryer. The vagina may also shrink in size. It may become less elastic. Atrophic vaginitis tends to get worse over time as a woman's estrogen level drops. What are the causes? This condition is caused by the normal drop in estrogen that happens around the time of menopause. What increases the risk? Certain conditions or situations may lower a woman's estrogen level, which increases her risk of atrophic vaginitis. These include:  Taking medicine that blocks estrogen.  Having ovaries removed surgically.  Being treated for cancer with X-ray treatment (radiation) or medicines (chemotherapy).  Exercising very hard and often.  Having an eating  disorder (anorexia).  Giving birth or breastfeeding.  Being over the age of 33.  Smoking. What are the signs or symptoms? Symptoms of this condition include:  Pain, soreness, or bleeding during sexual intercourse (dyspareunia).  Vaginal burning, irritation, or itching.  Pain or bleeding during a vaginal examination using a speculum (pelvic exam).  Loss of interest in sexual activity.  Having burning pain when passing urine.  Vaginal discharge that is brown or yellow. In some cases, there are no symptoms. How is this diagnosed? This condition is diagnosed with a medical history and physical exam. This will include a pelvic exam that checks whether the inside of your vagina appears pale, thin, or dry. Rarely, you may also have other tests, including:  A urine test.  A test that checks the acid balance in your vaginal fluid (acid balance test). How is this treated? Treatment for this condition may depend on the severity of your symptoms. Treatment may include:  Using an over-the-counter vaginal lubricant before you have sexual intercourse.  Using a long-acting vaginal moisturizer.  Using low-dose vaginal estrogen for moderate to severe symptoms that do not respond to other treatments. Options include creams, tablets, and inserts (vaginal rings). Before using vaginal estrogen, tell your health care provider if you have a history of:  Breast cancer.  Endometrial cancer.  Blood clots.  Taking medicines. You may be able to take a daily pill for dyspareunia. Discuss all of the risks of this medicine with your health care provider. It is usually  not recommended for women who have a family history or personal history of breast cancer. If your symptoms are very mild and you are not sexually active, you may not need treatment. Follow these instructions at home:  Take medicines only as directed by your health care provider. Do not use herbal or alternative medicines unless your  health care provider says that you can.  Use over-the-counter creams, lubricants, or moisturizers for dryness only as directed by your health care provider.  If your atrophic vaginitis is caused by menopause, discuss all of your menopausal symptoms and treatment options with your health care provider.  Do not douche.  Do not use products that can make your vagina dry. These include:  Scented feminine sprays.  Scented tampons.  Scented soaps.  If it hurts to have sex, talk with your sexual partner. Contact a health care provider if:  Your discharge looks different than normal.  Your vagina has an unusual smell.  You have new symptoms.  Your symptoms do not improve with treatment.  Your symptoms get worse. This information is not intended to replace advice given to you by your health care provider. Make sure you discuss any questions you have with your health care provider. Document Released: 07/25/2014 Document Revised: 08/16/2015 Document Reviewed: 03/01/2014 Elsevier Interactive Patient Education  2017 [employer].    If you have any other questions or concerns, please feel free to call the clinic or send a message through [place]. You may also schedule an earlier appointment if necessary.  Cassell Smiles, DNP, AGNP-BC Adult Gerontology Nurse Practitioner [place]

## 2016-07-30 ENCOUNTER — Other Ambulatory Visit: Payer: Self-pay | Admitting: Nurse Practitioner

## 2016-07-30 ENCOUNTER — Telehealth: Payer: Self-pay | Admitting: Nurse Practitioner

## 2016-07-30 DIAGNOSIS — Z23 Encounter for immunization: Secondary | ICD-10-CM

## 2016-07-30 NOTE — Telephone Encounter (Signed)
Pt called to inquire about tetanus vaccine because she did not receive it in clinic before she left.  Pt may come at any time to have the Tdap administered by a CMA. Call the same day before she arrives to schedule a CMA visit.  Possibly Monday afternoon.  Pt also states she has history of endometrial CA and had been recommended to never take estrogen products.  Advised pt not to start the premarin cream for vaginal itching.  Will followup with additional recommendations for relief of itching at later date.  Pt also states BP was 128/60 when she was home. Has concerns about lowering her bystolic dose.  She prefers to continue monitoring her BP and symptoms before lowering the dose r/t prior retinal hemorrhage secondary to htn.  Ok to proceed without dose adjustment as long as free from dizziness/lightheadedness symptoms in presence of hypotension of SBP or DBP.

## 2016-07-30 NOTE — Progress Notes (Signed)
I have reviewed this encounter including the documentation in this note and/or discussed this patient with the provider, Cassell Smiles, AGPCNP-BC. I am certifying that I agree with the content of this note as supervising physician.  [name], [facility] 07/30/2016, 9:21 PM

## 2016-08-04 ENCOUNTER — Ambulatory Visit (INDEPENDENT_AMBULATORY_CARE_PROVIDER_SITE_OTHER): Payer: Medicare PPO

## 2016-08-04 DIAGNOSIS — Z23 Encounter for immunization: Secondary | ICD-10-CM | POA: Diagnosis not present

## 2016-08-06 ENCOUNTER — Telehealth: Payer: Self-pay

## 2016-08-06 NOTE — Telephone Encounter (Signed)
Chest Ct scan scheduled for Thursday, May 31st @ 10:00am at [facility]. She needs to arrive 15 minutes earlier for her appt. No special instructions needed. Attempted to notify pt, LM w/ husband to have the pt to call me back.

## 2016-08-06 NOTE — Telephone Encounter (Signed)
Pt notified of appt and also given information to schedule dexa scan.

## 2016-08-14 ENCOUNTER — Telehealth: Payer: Self-pay

## 2016-08-14 NOTE — Telephone Encounter (Signed)
Patient called reporting that she wants to hold off on the CT scan for a while.  She feels good and thinks she is ok.  She would like to know your thoughts.  She also canceled her MCW and wants to know your thoughts on that too.  I did explain to her the importance of the [place].    Please advise

## 2016-08-15 NOTE — Telephone Encounter (Signed)
Returned patient call.  Mutual decision-making discussion in clinic ended w/ patient going home to discuss CT scan with family.  Ultimately they decided together not to proceed w/ CT.  Decision ok w/ me.  MCW encouraged to pt.  She states she will call to reschedule this within the calendar year.  Pt also states she continues to have vaginal itching with using replens and other OTC vaginal moisturizers. Cannot use hormonal agents, so suggest more naturopathic remedy of applying coconut oil externally as moisturizer.

## 2016-08-19 ENCOUNTER — Ambulatory Visit
Admission: RE | Admit: 2016-08-19 | Discharge: 2016-08-19 | Disposition: A | Discharge status: Home / Self Care | Payer: Medicare PPO | Source: Ambulatory Visit | Attending: Nurse Practitioner | Admitting: Nurse Practitioner

## 2016-08-19 DIAGNOSIS — M810 Age-related osteoporosis without current pathological fracture: Secondary | ICD-10-CM | POA: Insufficient documentation

## 2016-08-19 DIAGNOSIS — M8589 Other specified disorders of bone density and structure, multiple sites: Secondary | ICD-10-CM

## 2016-08-19 DIAGNOSIS — M8588 Other specified disorders of bone density and structure, other site: Secondary | ICD-10-CM | POA: Diagnosis not present

## 2016-08-20 ENCOUNTER — Ambulatory Visit: Payer: Medicare PPO

## 2016-08-21 ENCOUNTER — Ambulatory Visit: Payer: Medicare PPO

## 2016-08-21 ENCOUNTER — Telehealth: Payer: Self-pay

## 2016-08-21 NOTE — Telephone Encounter (Signed)
I contacted the Scheduling Radiology dept to f/u  CT Scan of the Chest. They informed me that the pt cancelled the first CT scan order and rescheduled it for a later date, but then cancelled again and didn't want to reschedule.

## 2016-08-21 NOTE — Telephone Encounter (Signed)
Aware of this decision.  Pt confirmed no desire to rescan.

## 2016-09-04 ENCOUNTER — Other Ambulatory Visit: Payer: Self-pay | Admitting: Nurse Practitioner

## 2016-09-04 MED ORDER — LORAZEPAM 0.5 MG PO TABS: 0.5000 mg | ORAL_TABLET | Freq: Every day | ORAL | 0 refills | Status: DC

## 2016-09-04 NOTE — Telephone Encounter (Signed)
Problem not discussed with patient in past.  Will need appointment to discuss before additional refills.  Please schedule follow up appointment for last 2 weeks of September.  [facility] reviewed for prior fills. Will run out on July 5th.  Can be filled July 2nd to ensure arrives at pt's home prior to end of current medication.

## 2016-09-04 NOTE — Telephone Encounter (Signed)
Pt. called requesting a refill on Lorazepam 1 mg  Mail order  [facility].

## 2016-09-11 ENCOUNTER — Encounter: Payer: Medicare PPO | Admitting: Nurse Practitioner

## 2016-09-12 ENCOUNTER — Encounter: Payer: Self-pay | Admitting: Nurse Practitioner

## 2016-09-12 ENCOUNTER — Ambulatory Visit (INDEPENDENT_AMBULATORY_CARE_PROVIDER_SITE_OTHER): Payer: Medicare PPO | Admitting: Nurse Practitioner

## 2016-09-12 VITALS — BP 123/52 | HR 56 | Temp 97.7°F | Resp 16 | Ht 65.0 in | Wt 139.0 lb

## 2016-09-12 DIAGNOSIS — J439 Emphysema, unspecified: Secondary | ICD-10-CM | POA: Diagnosis not present

## 2016-09-12 DIAGNOSIS — J441 Chronic obstructive pulmonary disease with (acute) exacerbation: Secondary | ICD-10-CM

## 2016-09-12 MED ORDER — TIOTROPIUM BROMIDE MONOHYDRATE 18 MCG IN CAPS: 18.0000 ug | ORAL_CAPSULE | Freq: Once | RESPIRATORY_TRACT | 5 refills | Status: DC

## 2016-09-12 MED ORDER — PREDNISONE 50 MG PO TABS: 50.0000 mg | ORAL_TABLET | Freq: Every day | ORAL | 0 refills | Status: AC

## 2016-09-12 NOTE — Progress Notes (Signed)
Subjective:    [name], female    DOB: 02-28-1941, 76 y.o.   MRN: [id number]  Margaret Black is a 76 y.o. female presenting on 09/12/2016 for Cough (onset 8 days HA runny nose )   HPI  Cough Pt has had a nonproductive cough x 2 weeks.  States she can go 1 hr w/o cough then has "fits" and starts to wheeze.  She admits she is "paranoid about lungs."    Associated symptoms include an "aggravating tickle in throat" w/ occasional hay fever and a heaviness in her chest preventing a deep breath.   Pt denies fever, chills, sweats, nausea, vomiting, diarrhea and constipation. She has not had the energy to walk today and she normally walks daily.  She is not taking any OTC meds for symptoms.  Has used her albuterol inhaler a few times.   Pt has chronic bronchitis/COPD, but does not take any regular maintenance inhalers.    Social History  Substance Use Topics  . Smoking status: Former Smoker    Types: Cigarettes    Quit date: 07/04/1991  . Smokeless tobacco: Never Used     Comment: Quit in early 1980's.  . Alcohol use No    Review of Systems Per HPI unless specifically indicated above     Objective:    BP (!) 123/52   Pulse (!) 56   Temp 97.7 F (36.5 C) (Oral)   Resp 16   Ht 5\' 5"  (1.651 m)   Wt 139 lb (63 kg)   SpO2 100%   BMI 23.13 kg/m   Wt Readings from Last 3 Encounters:  09/12/16 139 lb (63 kg)  07/29/16 137 lb 6.4 oz (62.3 kg)  07/01/16 134 lb 3.2 oz (60.9 kg)    Physical Exam  Constitutional: She is oriented to person, place, and time. She appears well-developed and well-nourished. No distress.  HENT:  Head: Normocephalic and atraumatic.  Neck: Neck supple.  Cardiovascular: Normal rate, regular rhythm, normal heart sounds and intact distal pulses.   Pulmonary/Chest: Effort normal. She has wheezes. She has rhonchi. She has rales.  Adventitious breath sounds throughout all lobes.  Rhonchi clear some w/ cough.  Abdominal: Soft. Bowel sounds are  normal. She exhibits no distension and no mass. There is no tenderness.  Musculoskeletal: She exhibits no edema.  Lymphadenopathy:    She has no cervical adenopathy.  Neurological: She is alert and oriented to person, place, and time.  Skin: Skin is warm and dry.  Psychiatric: She has a normal mood and affect. Her behavior is normal. Judgment and thought content normal.  Vitals reviewed.  Results for orders placed or performed in visit on 07/25/16  COMPLETE METABOLIC PANEL WITH GFR  Result Value Ref Range   Sodium 140 135 - 146 mmol/L   Potassium 4.4 3.5 - 5.3 mmol/L   Chloride 104 98 - 110 mmol/L   CO2 26 20 - 31 mmol/L   Glucose, Bld 89 65 - 99 mg/dL   BUN 30 (H) 7 - 25 mg/dL   Creat 1.33 (H) 0.60 - 0.93 mg/dL   Total Bilirubin 0.8 0.2 - 1.2 mg/dL   Alkaline Phosphatase 77 33 - 130 U/L   AST 18 10 - 35 U/L   ALT 15 6 - 29 U/L   Total Protein 7.2 6.1 - 8.1 g/dL   Albumin 4.3 3.6 - 5.1 g/dL   Calcium 9.9 8.6 - 10.4 mg/dL   GFR, Est African American 45 (L) >=  60 mL/min   GFR, Est Non African American 39 (L) >=60 mL/min  CBC  Result Value Ref Range   WBC 5.0 3.8 - 10.8 K/uL   RBC 3.85 3.80 - 5.10 MIL/uL   Hemoglobin 12.3 11.7 - 15.5 g/dL   HCT 37.8 35.0 - 45.0 %   MCV 98.2 80.0 - 100.0 fL   MCH 31.9 27.0 - 33.0 pg   MCHC 32.5 32.0 - 36.0 g/dL   RDW 13.8 11.0 - 15.0 %   Platelets 303 140 - 400 K/uL   MPV 9.4 7.5 - 12.5 fL  Lipid panel  Result Value Ref Range   Cholesterol 179 <200 mg/dL   Triglycerides 105 <150 mg/dL   HDL 50 (L) >50 mg/dL   Total CHOL/HDL Ratio 3.6 <5.0 Ratio   VLDL 21 <30 mg/dL   LDL Cholesterol 108 (H) <100 mg/dL  TSH  Result Value Ref Range   TSH 1.01 mIU/L  T4, free  Result Value Ref Range   Free T4 1.7 0.8 - 1.8 ng/dL  Thyroglobulin antibody  Result Value Ref Range   Thyroglobulin Ab 3 (H) <2 IU/mL  Thyroglobulin Level  Result Value Ref Range   Thyroglobulin <0.1 (L) ng/mL      Assessment & Plan:   Problem List Items Addressed This  Visit      Respiratory   COPD (chronic obstructive pulmonary disease) (HCC)    Pt w/ acute exacerbation of COPD w/ bronchitis.  No active signs of systemic symptoms.  Symptoms limited to cough and increased wheezing.  Plan:  1. START spiriva one tablet once daily and continue taking daily. 2. Start prednisone 50 mg once daily x 5 days. 3. Continue albuterol 1-2 puffs Q6 hr as needed for wheezing. 4. Follow up 3 months or sooner if symptoms persist or do not improve.       Other Visit Diagnoses    COPD with acute exacerbation ([place])    -  Primary   See COPD      Meds ordered this encounter  Medications  . tiotropium (SPIRIVA) 18 MCG inhalation capsule    Sig: Place 1 capsule (18 mcg total) into inhaler and inhale once.    Dispense:  30 capsule    Refill:  5  . predniSONE (DELTASONE) 50 MG tablet    Sig: Take 1 tablet (50 mg total) by mouth daily.    Dispense:  5 tablet    Refill:  0      Follow up plan: Return in about 3 months (around 12/13/2016) for COPD.   Cassell Smiles, DNP, AGPCNP-BC Adult Gerontology Primary Care Nurse Practitioner [facility] 09/19/2016, 5:10 PM

## 2016-09-12 NOTE — Patient Instructions (Addendum)
Margaret Black, Thank you for coming in to clinic today.  You have a chronic bronchitis/COPD exacerbation. - TAKE albuterol 2 puffs up to 6 times daily as needed. - START prednisone 50 mg tablet. Take one tablet once daily for 5 days. - START Spiriva (tiotropium bromide) one puff once daily.  Please schedule a follow-up appointment with Cassell Smiles, AGNP to Return in about 3 months (around 12/13/2016) for COPD.  If you have any other questions or concerns, please feel free to call the clinic or send a message through [place]. You may also schedule an earlier appointment if necessary.  Cassell Smiles, DNP, AGNP-BC Adult Gerontology Nurse Practitioner [facility]    Chronic Obstructive Pulmonary Disease Exacerbation Chronic obstructive pulmonary disease (COPD) is a common lung condition in which airflow from the lungs is limited. COPD is a general term that can be used to describe many different lung problems that limit airflow, including chronic bronchitis and emphysema. COPD exacerbations are episodes when breathing symptoms become much worse and require extra treatment. Without treatment, COPD exacerbations can be life threatening, and frequent COPD exacerbations can cause further damage to your lungs. What are the causes?  Respiratory infections.  Exposure to smoke.  Exposure to air pollution, chemical fumes, or dust. Sometimes there is no apparent cause or trigger. What increases the risk?  Smoking cigarettes.  Older age.  Frequent prior COPD exacerbations. What are the signs or symptoms?  Increased coughing.  Increased thick spit (sputum) production.  Increased wheezing.  Increased shortness of breath.  Rapid breathing.  Chest tightness. How is this diagnosed? Your medical history, a physical exam, and tests will help your health care provider make a diagnosis. Tests may include:  A chest X-ray.  Basic lab tests.  Sputum testing.  An arterial  blood gas test.  How is this treated? Depending on the severity of your COPD exacerbation, you may need to be admitted to a hospital for treatment. Some of the treatments commonly used to treat COPD exacerbations are:  Antibiotic medicines.  Bronchodilators. These are drugs that expand the air passages. They may be given with an inhaler or nebulizer. Spacer devices may be needed to help improve drug delivery.  Corticosteroid medicines.  Supplemental oxygen therapy.  Airway clearing techniques, such as noninvasive ventilation (NIV) and positive expiratory pressure (PEP). These provide respiratory support through a mask or other noninvasive device.  Follow these instructions at home:  Do not smoke. Quitting smoking is very important to prevent COPD from getting worse and exacerbations from happening as often.  Avoid exposure to all substances that irritate the airway, especially to tobacco smoke.  If you were prescribed an antibiotic medicine, finish it all even if you start to feel better.  Take all medicines as directed by your health care provider.It is important to use correct technique with inhaled medicines.  Drink enough fluids to keep your urine clear or pale yellow (unless you have a medical condition that requires fluid restriction).  Use a cool mist vaporizer. This makes it easier to clear your chest when you cough.  If you have a home nebulizer and oxygen, continue to use them as directed.  Maintain all necessary vaccinations to prevent infections.  Exercise regularly.  Eat a healthy diet.  Keep all follow-up appointments as directed by your health care provider. Get help right away if:  You have worsening shortness of breath.  You have trouble talking.  You have severe chest pain.  You have blood in  your sputum.  You have a fever.  You have weakness, vomit repeatedly, or faint.  You feel confused.  You continue to get worse. This information is not  intended to replace advice given to you by your health care provider. Make sure you discuss any questions you have with your health care provider. Document Released: 01/05/2007 Document Revised: 08/16/2015 Document Reviewed: 11/12/2012 Elsevier Interactive Patient Education  2017 [employer].

## 2016-09-19 NOTE — Progress Notes (Signed)
I have reviewed this encounter including the documentation in this note and/or discussed this patient with the provider, Cassell Smiles, AGPCNP-BC. I am certifying that I agree with the content of this note as supervising physician.  [name], [facility] 09/19/2016, 5:20 PM

## 2016-09-19 NOTE — Assessment & Plan Note (Addendum)
Pt w/ acute exacerbation of COPD w/ bronchitis.  No active signs of systemic symptoms.  Symptoms limited to cough and increased wheezing.  Plan:  1. START spiriva one tablet once daily and continue taking daily. 2. Start prednisone 50 mg once daily x 5 days. 3. Continue albuterol 1-2 puffs Q6 hr as needed for wheezing. 4. Follow up 3 months or sooner if symptoms persist or do not improve.

## 2016-10-16 ENCOUNTER — Telehealth: Payer: Self-pay | Admitting: Nurse Practitioner

## 2016-10-16 DIAGNOSIS — I10 Essential (primary) hypertension: Secondary | ICD-10-CM

## 2016-10-16 DIAGNOSIS — K219 Gastro-esophageal reflux disease without esophagitis: Secondary | ICD-10-CM

## 2016-10-16 MED ORDER — NEBIVOLOL HCL 5 MG PO TABS: 2.5000 mg | ORAL_TABLET | Freq: Every day | ORAL | 1 refills | Status: DC

## 2016-10-16 MED ORDER — NIFEDIPINE ER OSMOTIC RELEASE 60 MG PO TB24: ORAL_TABLET | ORAL | 1 refills | Status: DC

## 2016-10-16 MED ORDER — SPIRONOLACTONE 25 MG PO TABS
25.0000 mg | ORAL_TABLET | Freq: Two times a day (BID) | ORAL | 1 refills | Status: DC
Start: 2016-10-16 — End: 2017-05-27

## 2016-10-16 MED ORDER — RANITIDINE HCL 150 MG PO CAPS: 150.0000 mg | ORAL_CAPSULE | Freq: Every day | ORAL | 1 refills | Status: DC

## 2016-10-16 NOTE — Telephone Encounter (Signed)
Pt needs refills on nifedipine, ranitidine, spironolactone, bystolic.  She uses [facility] order.  Her call back number is [phone]

## 2016-10-22 ENCOUNTER — Other Ambulatory Visit: Payer: Self-pay

## 2016-10-22 ENCOUNTER — Ambulatory Visit (INDEPENDENT_AMBULATORY_CARE_PROVIDER_SITE_OTHER): Payer: Medicare PPO | Admitting: Nurse Practitioner

## 2016-10-22 ENCOUNTER — Telehealth: Payer: Self-pay

## 2016-10-22 ENCOUNTER — Encounter: Payer: Self-pay | Admitting: Nurse Practitioner

## 2016-10-22 VITALS — BP 122/44 | HR 59 | Temp 98.1°F | Ht 65.0 in | Wt 131.0 lb

## 2016-10-22 DIAGNOSIS — R1084 Generalized abdominal pain: Secondary | ICD-10-CM

## 2016-10-22 DIAGNOSIS — K580 Irritable bowel syndrome with diarrhea: Secondary | ICD-10-CM | POA: Diagnosis not present

## 2016-10-22 DIAGNOSIS — K591 Functional diarrhea: Secondary | ICD-10-CM

## 2016-10-22 DIAGNOSIS — J439 Emphysema, unspecified: Secondary | ICD-10-CM | POA: Diagnosis not present

## 2016-10-22 MED ORDER — TIOTROPIUM BROMIDE MONOHYDRATE 18 MCG IN CAPS: 18.0000 ug | ORAL_CAPSULE | Freq: Every day | RESPIRATORY_TRACT | 5 refills | Status: DC

## 2016-10-22 MED ORDER — DICYCLOMINE HCL 10 MG PO CAPS: 10.0000 mg | ORAL_CAPSULE | Freq: Three times a day (TID) | ORAL | 1 refills | Status: DC

## 2016-10-22 MED ORDER — RIFAXIMIN 200 MG PO TABS: 200.0000 mg | ORAL_TABLET | Freq: Three times a day (TID) | ORAL | 0 refills | Status: DC

## 2016-10-22 NOTE — Assessment & Plan Note (Signed)
Pt w/ stable COPD.  Pt notices difficulty with getting full breath of air.  No recent spirometry.  Rhonchi noted in all lobes with popping alveoli at end inspiration.  Pt not taking long-acting medication, only albuterol as needed.  Plan:  1. START spiriva one capsule inhaled w/ inhaler once daily and continue taking daily. 2. Continue albuterol 1-2 puffs Q6 hr as needed for wheezing or shortness of breath. 3. In office spirometry assess function.  4. Follow up 3 months or sooner if symptoms persist or do not improve.

## 2016-10-22 NOTE — Progress Notes (Signed)
Subjective:    [name], female    DOB: 10/01/1940, 76 y.o.   MRN: [id number]  Margaret Black is a 76 y.o. female presenting on 10/22/2016 for Abdominal Pain (after eating severe abdominal pain x 2 weeks, burning ,irregular bowel movement ) and Shortness of Breath (with exertion )   HPI Abdominal Pain Pain as soon as she finishes eating feels "gnawing," sloshing of food until BM about 20 minutes after eating. Starts w/ initial constipation progresses to soft, runny, watery w/ pieces of food and continues to have bowel movement "until empty."   This episode started 2-3 weeks ago and has worsened.  Pt admits has had symptoms intermittently since diagnosis of < 1 year to live w/ lung cancer which was several years ago.   Pt notes recent relief of stress for daughter passing boards and has still had symptoms.  Crackers ok without symptoms.  Pt notes even if small breakfast w/ either soft boiled egg and melon, pancakes 3 small, or french toast x 2 slices will initiate symptoms.  Breakfast and Lunch worst symptoms as first meal of day Dinner less intense Pt notes she does not always eat 3 meals.  Has had cholecystectomy in past.  Eats low fat diet to avoid symptoms, few processed foods,   Shortness of breath Pt is not taking spiriva.  Notes she never needed it after her last bronchitis.  She is still taking albuterol as needed.  Pt notes difficulty catching her breath, taking full deep breath.  Pt has difficulty describing symptoms.   History of smoking for 15-20 years.  Never took spiriva.  Albuterol as needed.  Pt has history of lung cancer and partial lobectomy.  Pt had previously cancelled her CT chest and decides she now wants to proceed. Prior Authorization #: [id number] from her most recent order.  Unsure if still active.   Social History  Substance Use Topics  . Smoking status: Former Smoker    Types: Cigarettes    Quit date: 07/04/1991  . Smokeless tobacco: Never Used      Comment: Quit in early 1980's.  . Alcohol use No    Review of Systems Per HPI unless specifically indicated above     Objective:    BP (!) 122/44 (BP Location: Right Arm, Patient Position: Sitting, Cuff Size: Normal)   Pulse (!) 59   Temp 98.1 F (36.7 C) (Oral)   Ht 5\' 5"  (1.651 m)   Wt 131 lb (59.4 kg)   SpO2 100%   BMI 21.80 kg/m   Wt Readings from Last 3 Encounters:  10/22/16 131 lb (59.4 kg)  09/12/16 139 lb (63 kg)  07/29/16 137 lb 6.4 oz (62.3 kg)    Physical Exam  Constitutional: She is oriented to person, place, and time. She appears well-developed and well-nourished. No distress.  HENT:  Head: Normocephalic and atraumatic.  Nose: Nose normal.  Mouth/Throat: Oropharynx is clear and moist.  Neck: Normal range of motion. Neck supple. No JVD present. No tracheal deviation present.  Cardiovascular: Normal rate, regular rhythm and intact distal pulses.   Murmur heard. Pulmonary/Chest: Effort normal. She has no decreased breath sounds. She has no wheezes. She has rhonchi. She has no rales.  Abdominal: Bowel sounds are increased. There is no hepatosplenomegaly. There is tenderness in the right lower quadrant and left lower quadrant. There is no rigidity, no rebound, no guarding, no CVA tenderness, no tenderness at McBurney's point and negative Murphy's sign.  Lymphadenopathy:  She has no cervical adenopathy.  Neurological: She is alert and oriented to person, place, and time.  Skin: Skin is warm and dry.  Psychiatric: She has a normal mood and affect. Her behavior is normal. Judgment and thought content normal.  Vitals reviewed.   Results for orders placed or performed in visit on 07/25/16  COMPLETE METABOLIC PANEL WITH GFR  Result Value Ref Range   Sodium 140 135 - 146 mmol/L   Potassium 4.4 3.5 - 5.3 mmol/L   Chloride 104 98 - 110 mmol/L   CO2 26 20 - 31 mmol/L   Glucose, Bld 89 65 - 99 mg/dL   BUN 30 (H) 7 - 25 mg/dL   Creat 1.33 (H) 0.60 - 0.93 mg/dL     Total Bilirubin 0.8 0.2 - 1.2 mg/dL   Alkaline Phosphatase 77 33 - 130 U/L   AST 18 10 - 35 U/L   ALT 15 6 - 29 U/L   Total Protein 7.2 6.1 - 8.1 g/dL   Albumin 4.3 3.6 - 5.1 g/dL   Calcium 9.9 8.6 - 10.4 mg/dL   GFR, Est African American 45 (L) >=60 mL/min   GFR, Est Non African American 39 (L) >=60 mL/min  CBC  Result Value Ref Range   WBC 5.0 3.8 - 10.8 K/uL   RBC 3.85 3.80 - 5.10 MIL/uL   Hemoglobin 12.3 11.7 - 15.5 g/dL   HCT 37.8 35.0 - 45.0 %   MCV 98.2 80.0 - 100.0 fL   MCH 31.9 27.0 - 33.0 pg   MCHC 32.5 32.0 - 36.0 g/dL   RDW 13.8 11.0 - 15.0 %   Platelets 303 140 - 400 K/uL   MPV 9.4 7.5 - 12.5 fL  Lipid panel  Result Value Ref Range   Cholesterol 179 <200 mg/dL   Triglycerides 105 <150 mg/dL   HDL 50 (L) >50 mg/dL   Total CHOL/HDL Ratio 3.6 <5.0 Ratio   VLDL 21 <30 mg/dL   LDL Cholesterol 108 (H) <100 mg/dL  TSH  Result Value Ref Range   TSH 1.01 mIU/L  T4, free  Result Value Ref Range   Free T4 1.7 0.8 - 1.8 ng/dL  Thyroglobulin antibody  Result Value Ref Range   Thyroglobulin Ab 3 (H) <2 IU/mL  Thyroglobulin Level  Result Value Ref Range   Thyroglobulin <0.1 (L) ng/mL      Assessment & Plan:   Problem List Items Addressed This Visit      Respiratory   COPD (chronic obstructive pulmonary disease) ([place])    Pt w/ stable COPD.  Pt notices difficulty with getting full breath of air.  No recent spirometry.  Rhonchi noted in all lobes with popping alveoli at end inspiration.  Pt not taking long-acting medication, only albuterol as needed.  Plan:  1. START spiriva one capsule inhaled w/ inhaler once daily and continue taking daily. 2. Continue albuterol 1-2 puffs Q6 hr as needed for wheezing or shortness of breath. 3. In office spirometry assess function.  4. Follow up 3 months or sooner if symptoms persist or do not improve.      Relevant Orders   Spirometry: Pre & Post Eval     Digestive   IBS - Primary    Symptoms persist over months w/  intermittent periods of worsening abdominal cramping and diarrhea c/w  IBS-D.  Possible inflammatory bowel disease as well.  No suspicion of systemic disease or acute gastroenteritis.  Possible component of anxiety, but unlikely.  Plan: 1.  Start metamucil 1/4 dose twice daily and increase to 1 full dose twice daily. 2. Start Xifaxan tid for 2 weeks. 3. Start dicyclomine 10 mg achs prn.  Reviewed common side effects of anticholinergic drugs worse in elderly population. 4. Follow up as needed.  May need GI referral in future.      Relevant Medications   rifaximin (XIFAXAN) 200 MG tablet   dicyclomine (BENTYL) 10 MG capsule    Other Visit Diagnoses    Generalized abdominal pain       Functional diarrhea       See A/P IBS      Meds ordered this encounter  Medications  . rifaximin (XIFAXAN) 200 MG tablet    Sig: Take 1 tablet (200 mg total) by mouth 3 (three) times daily.    Dispense:  42 tablet    Refill:  0    Order Specific Question:   Supervising Provider    Answer:   Olin Hauser [[id number]]  . dicyclomine (BENTYL) 10 MG capsule    Sig: Take 1 capsule (10 mg total) by mouth 4 (four) times daily -  before meals and at bedtime.    Dispense:  120 capsule    Refill:  1    Order Specific Question:   Supervising Provider    Answer:   Olin Hauser [[id number]]      Follow up plan: Return for as needed if symptoms worsen or do not improve,  2 months for IBS - diarrhea.   Cassell Smiles, DNP, AGPCNP-BC Adult Gerontology Primary Care Nurse Practitioner [facility] 10/22/2016, 12:59 PM

## 2016-10-22 NOTE — Progress Notes (Signed)
I have reviewed this encounter including the documentation in this note and/or discussed this patient with the provider, Cassell Smiles, AGPCNP-BC. I am certifying that I agree with the content of this note as supervising physician.  [name], [facility] 10/22/2016, 2:34 PM

## 2016-10-22 NOTE — Assessment & Plan Note (Signed)
Symptoms persist over months w/ intermittent periods of worsening abdominal cramping and diarrhea c/w  IBS-D.  Possible inflammatory bowel disease as well.  No suspicion of systemic disease or acute gastroenteritis.  Possible component of anxiety, but unlikely.  Plan: 1. Start metamucil 1/4 dose twice daily and increase to 1 full dose twice daily. 2. Start Xifaxan tid for 2 weeks. 3. Start dicyclomine 10 mg achs prn.  Reviewed common side effects of anticholinergic drugs worse in elderly population. 4. Follow up as needed.  May need GI referral in future.

## 2016-10-22 NOTE — Patient Instructions (Addendum)
Margaret Black, Thank you for coming in to clinic today.  1. For your abdominal pain: - likely IBS with diarrhea. - Take Xifaxan 3 times daily for 2 weeks. - dicyclomine 10 mg up to 3 times daily and at bedtime.   2. For your breathing:  - Spirometry in clinic: any Wed or Thursday afternoon or 11 am. - continue albuterol.   Please schedule a follow-up appointment with Cassell Smiles, AGNP.  Return for as needed if symptoms worsen or do not improve,  2 months for IBS - diarrhea.   If you have any other questions or concerns, please feel free to call the clinic or send a message through [place]. You may also schedule an earlier appointment if necessary.   You will receive a survey after today's visit either digitally by e-mail or paper by [employer]. Your experiences and feedback matter to [place].  Please respond so we know how we are doing as we provide care for you.   Cassell Smiles, DNP, AGNP-BC Adult Gerontology Nurse Practitioner [place]

## 2016-10-22 NOTE — Telephone Encounter (Signed)
Pt came in the office today requesting that we resubmit her CT Scan that she cancelled back in May of this year. PA approved from August 6th - Sept. 5th. Approval # [id number]. Appt scheduled for Monday,  August 6 at 2:30pm. The pt was notified, no questions or concerns.

## 2016-10-23 ENCOUNTER — Other Ambulatory Visit: Payer: Self-pay

## 2016-10-24 ENCOUNTER — Other Ambulatory Visit: Payer: Self-pay

## 2016-10-27 ENCOUNTER — Ambulatory Visit
Admission: RE | Admit: 2016-10-27 | Discharge: 2016-10-27 | Disposition: A | Discharge status: Home / Self Care | Payer: Medicare PPO | Source: Ambulatory Visit | Attending: Nurse Practitioner | Admitting: Nurse Practitioner

## 2016-10-27 DIAGNOSIS — I700 Atherosclerosis of aorta: Secondary | ICD-10-CM | POA: Diagnosis not present

## 2016-10-27 DIAGNOSIS — J439 Emphysema, unspecified: Secondary | ICD-10-CM | POA: Insufficient documentation

## 2016-10-27 DIAGNOSIS — R918 Other nonspecific abnormal finding of lung field: Secondary | ICD-10-CM | POA: Diagnosis not present

## 2016-10-28 ENCOUNTER — Other Ambulatory Visit: Payer: Self-pay | Admitting: Nurse Practitioner

## 2016-10-28 DIAGNOSIS — R918 Other nonspecific abnormal finding of lung field: Secondary | ICD-10-CM

## 2016-10-29 ENCOUNTER — Ambulatory Visit: Payer: Medicare PPO

## 2016-10-30 ENCOUNTER — Telehealth: Payer: Self-pay | Admitting: Nurse Practitioner

## 2016-10-30 DIAGNOSIS — N183 Chronic kidney disease, stage 3 (moderate): Secondary | ICD-10-CM | POA: Diagnosis not present

## 2016-10-30 DIAGNOSIS — R1084 Generalized abdominal pain: Secondary | ICD-10-CM

## 2016-10-30 DIAGNOSIS — M329 Systemic lupus erythematosus, unspecified: Secondary | ICD-10-CM | POA: Diagnosis not present

## 2016-10-30 DIAGNOSIS — I10 Essential (primary) hypertension: Secondary | ICD-10-CM | POA: Diagnosis not present

## 2016-10-30 DIAGNOSIS — N119 Chronic tubulo-interstitial nephritis, unspecified: Secondary | ICD-10-CM | POA: Diagnosis not present

## 2016-10-30 DIAGNOSIS — D631 Anemia in chronic kidney disease: Secondary | ICD-10-CM | POA: Diagnosis not present

## 2016-10-30 DIAGNOSIS — R197 Diarrhea, unspecified: Secondary | ICD-10-CM

## 2016-10-30 NOTE — Telephone Encounter (Addendum)
Called pt to triage.  Pt still having diarrhea as soon as she eats even small very bland meals.   - Pt reports extreme dizziness w/ bentyl, so has stopped. - Is taking metamucil, but thinks it is only making her stool more watery. - Pt states she has had diverticulitis in past and these symptoms are similar in character and duration.  At this point, pt has had continuous symptoms for about 3-4 weeks.  Plan: Symptoms consistent w/ IBS-D and dumping syndrome.  Pt w/ previous cholecystectomy, appendectomy, known diverticulosis.    Check CBC, CMP, amylase, lipase STAT today to rule out diverticulitis.  Low suspicion given no fever, chills, sweats, nausea, vomiting.  Discontinue Xifaxan secondary to cost.  Consider alternative diagnosis of chronic pancreatitis.

## 2016-10-30 NOTE — Telephone Encounter (Signed)
Pt asked if another antibiotic was going to be called in.  The one that was prescribed is very expensive.  She also said the other medications were making her dizzy.  Please call [phone]

## 2016-11-25 DIAGNOSIS — N183 Chronic kidney disease, stage 3 (moderate): Secondary | ICD-10-CM | POA: Diagnosis not present

## 2016-11-25 DIAGNOSIS — M329 Systemic lupus erythematosus, unspecified: Secondary | ICD-10-CM | POA: Diagnosis not present

## 2016-11-25 DIAGNOSIS — N119 Chronic tubulo-interstitial nephritis, unspecified: Secondary | ICD-10-CM | POA: Diagnosis not present

## 2016-11-25 DIAGNOSIS — I10 Essential (primary) hypertension: Secondary | ICD-10-CM | POA: Diagnosis not present

## 2016-11-25 DIAGNOSIS — D631 Anemia in chronic kidney disease: Secondary | ICD-10-CM | POA: Diagnosis not present

## 2016-11-27 DIAGNOSIS — N183 Chronic kidney disease, stage 3 (moderate): Secondary | ICD-10-CM | POA: Diagnosis not present

## 2016-11-27 DIAGNOSIS — I10 Essential (primary) hypertension: Secondary | ICD-10-CM | POA: Diagnosis not present

## 2016-12-02 ENCOUNTER — Ambulatory Visit: Payer: Medicare PPO | Admitting: Gastroenterology

## 2016-12-02 ENCOUNTER — Ambulatory Visit (INDEPENDENT_AMBULATORY_CARE_PROVIDER_SITE_OTHER): Payer: Medicare PPO

## 2016-12-02 VITALS — BP 135/59 | HR 63 | Temp 97.8°F | Ht 65.0 in | Wt 131.0 lb

## 2016-12-02 DIAGNOSIS — J439 Emphysema, unspecified: Secondary | ICD-10-CM

## 2016-12-02 NOTE — Progress Notes (Signed)
Pre-Post Spirometry

## 2016-12-03 ENCOUNTER — Ambulatory Visit: Payer: Medicare PPO | Admitting: Gastroenterology

## 2016-12-25 ENCOUNTER — Ambulatory Visit (INDEPENDENT_AMBULATORY_CARE_PROVIDER_SITE_OTHER): Payer: Medicare PPO | Admitting: Gastroenterology

## 2016-12-25 ENCOUNTER — Encounter: Payer: Self-pay | Admitting: Gastroenterology

## 2016-12-25 ENCOUNTER — Encounter (INDEPENDENT_AMBULATORY_CARE_PROVIDER_SITE_OTHER): Payer: Self-pay

## 2016-12-25 VITALS — BP 122/59 | HR 62 | Temp 98.1°F | Ht 65.0 in | Wt 131.2 lb

## 2016-12-25 DIAGNOSIS — K581 Irritable bowel syndrome with constipation: Secondary | ICD-10-CM | POA: Diagnosis not present

## 2016-12-25 DIAGNOSIS — S13101A Dislocation of unspecified cervical vertebrae, initial encounter: Secondary | ICD-10-CM | POA: Insufficient documentation

## 2016-12-25 DIAGNOSIS — M5412 Radiculopathy, cervical region: Secondary | ICD-10-CM | POA: Insufficient documentation

## 2016-12-25 DIAGNOSIS — R29898 Other symptoms and signs involving the musculoskeletal system: Secondary | ICD-10-CM | POA: Insufficient documentation

## 2016-12-25 NOTE — Progress Notes (Signed)
Cephas Darby, MD [address]  [place]  [place], [place] [address]  Main: [phone]  Fax: [phone]    Gastroenterology Consultation  Referring Provider:     Mikey College, * Primary Care Physician:  Mikey College, NP Primary Gastroenterologist:  Dr. Cephas Darby Reason for Consultation:     IBS-constipation        HPI:   Margaret Black referred by Dr. Merrilyn Puma, Jerrel Ivory, NP  for consultation & management of IBS-constipation. She had history of lung cancer and leiomyosarcoma, currently in remission presents with several years history of IBS-constipation which has gotten worse in last few months. She has severe lower abdominal cramps that starts off in the left lower quadrant then migrates to right lower quadrant, lower down in the pelvis before she has a BM, generally goes without a BM for 3 days followed by the initial stool which is hard associated with straining followed by 7-8 episodes of soft to watery bowel movements in a single day. This has been ongoing for a few months. She is very careful on what she eats. She initially tried Prevacid and it did not help and switched to Benefiber. She is taking one bar daily and currently her bowel movements are currently once a day but hard and associated with straining. She reports that overall her symptoms are better, particularly lower abdominal cramps. She denies any rectal bleeding or upper GI symptoms. She denies any abdominal surgeries  She has history of lupus, currently off plaquenil and methotrexate by her rheumatologist.  GI Procedures: Colonoscopy in 06/2007. Reports that it was normal and she did not need another colonoscopy.  Past Medical History:  Diagnosis Date  . Anxiety    Related to HTN and vision loss  . Central retinal vein occlusion of left eye    legally blind in Left eye  . 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  Collagen vascular disease ([place])    uncharacterized.  has seen rheum.  positive RF, positive serologies for Sjogrens.  C-ANCA/P-ANCA neg, ANA  neg, anti-dsDNA neg, anti-SCL70 neg, anti-centromere Ab neg  . COPD (chronic obstructive pulmonary disease) ([place])    quit smoking 1993  . Diverticulosis   . Dysfunction of eustachian tube   . Fatigue   . GERD (gastroesophageal reflux disease)   . Goiter, unspecified   . HLD (hyperlipidemia)   . HTN (hypertension)    difficult to control, hypertensive urgency hospitalizations, normal urine/plasma metanephrines and catecholamines, renal dopplers without stenosis  . Hyperaldosteronism    serum aldo/renin ration elevated (aldo 23.7, renin <0.15), CT abd 2010 with normal adrenals, dedicated adrenal CT without adrenal adenoma,   . Hypothyroid   . Insomnia    Related to HTN and vision loss  . Irritable bowel syndrome   . Leiomyosarcoma of uterus ([place]) 09/22/2014  . Lung cancer ([place])    s/p L lobectomy 2001  . Lupus   . Osteopenia   . Pulmonary hypertension ([place])    echo [facility] 12/2008 with EF >5%, grade II diastolic dysfunction, RSVP >5mmHg, ACE level normal.  f/u echo at [place] - EF 55-60%, mild MR, mild LAE, PASP 25 + RA (no pulm HTN)  . Urge incontinence   . Uterine cancer [facility])    s/p hysterectomy 2001    Past Surgical History:  Procedure Laterality Date  . ABDOMINAL HYSTERECTOMY  06/1999  . APPENDECTOMY  [place]  . CT ABD W & PELVIS WO CM  2010   neg except diverticulosis  . CT chest  2007   enlarged thyroid with goiter  . LOBECTOMY  04/1999   right lung  . THYROIDECTOMY    . TUBAL LIGATION  1975    Prior to Admission medications   Medication Sig Start Date End Date Taking? Authorizing Provider  albuterol (PROVENTIL HFA;VENTOLIN HFA) 108 (90 Base) MCG/ACT inhaler Inhale 1-2 puffs into the lungs every 6 (six) hours as needed for shortness of breath. 07/29/16  Yes Mikey College, NP  aspirin 81 MG tablet Take  81 mg by mouth daily.   Yes [provider]  calcium carbonate (TUMS - DOSED IN MG ELEMENTAL CALCIUM) 500 MG chewable tablet Chew 2 tablets by mouth daily.   Yes [provider]  CALCIUM-VITAMIN D PO Take by mouth daily.   Yes [provider]  dicyclomine (BENTYL) 10 MG capsule Take 1 capsule (10 mg total) by mouth 4 (four) times daily -  before meals and at bedtime. 10/22/16  Yes Mikey College, NP  enalapril (VASOTEC) 2.5 MG tablet  04/30/16  Yes [provider]  levothyroxine (SYNTHROID, LEVOTHROID) 88 MCG tablet Take 1 tablet (88 mcg total) by mouth daily before breakfast. 03/05/16  Yes Tonia Ghent, MD  LORazepam (ATIVAN) 0.5 MG tablet Take 1 tablet (0.5 mg total) by mouth at bedtime. 09/22/16  Yes Mikey College, NP  Multiple Vitamin (MULTIVITAMIN) tablet Take 1 tablet by mouth daily.   Yes [provider]  nebivolol (BYSTOLIC) 5 MG tablet Take 0.5 tablets (2.5 mg total) by mouth daily. 10/16/16  Yes Mikey College, NP  NIFEdipine (NIFEDICAL XL) 60 MG 24 hr tablet TAKE 1 TABLET EVERY EVENING 10/16/16  Yes Mikey College, NP  spironolactone (ALDACTONE) 25 MG tablet Take 1 tablet (25 mg total) by mouth 2 (two) times daily. 10/16/16  Yes Mikey College, NP  vitamin C (ASCORBIC ACID) 500 MG tablet Take 500 mg by mouth daily.   Yes [provider]  clobetasol ointment (TEMOVATE) 0.05 % APPLY TO AFFECTED AREA ON THIGH TWICE DAILY UNTIL CLEAR 04/03/15   [provider]  fluticasone (CUTIVATE) 0.05 % cream  06/22/14   [provider]  ranitidine (ZANTAC) 150 MG capsule Take 1 capsule (150 mg total) by mouth daily. Patient not taking: Reported on 12/25/2016 10/16/16   Mikey College, NP  tiotropium (SPIRIVA) 18 MCG inhalation capsule Place 1 capsule (18 mcg total) into inhaler and inhale daily. Patient not taking: Reported on 12/25/2016 10/22/16   Mikey College, NP    Family History    Problem Relation Age of Onset  . Hypertension Mother   . Pneumonia Father   . Alcohol abuse Father      Social History  Substance Use Topics  . Smoking status: Former Smoker    Types: Cigarettes    Quit date: 07/04/1991  . Smokeless tobacco: Never Used     Comment: Quit in early 1980's.  . Alcohol use No    Allergies as of 12/25/2016 - Review Complete 12/25/2016  Allergen Reaction Noted  . Lactase Diarrhea 09/19/2014  . Latex Rash 11/20/2015    Review of Systems:    All systems reviewed and negative except where noted in HPI.   Physical Exam:  BP (!) 122/59   Pulse 62   Temp 98.1 F (36.7 C) (Oral)   Ht 5\' 5"  (  1.651 m)   Wt 131 lb 3.2 oz (59.5 kg)   BMI 21.83 kg/m  No LMP recorded. Patient has had a hysterectomy.  General:   Alert,  Well-developed, well-nourished, pleasant and cooperative in NAD Head:  Normocephalic and atraumatic. Eyes:  Sclera clear, no icterus.   Conjunctiva pink. Ears:  Normal auditory acuity. Nose:  No deformity, discharge, or lesions. Mouth:  No deformity or lesions,oropharynx pink & moist. Neck:  Supple; no masses or thyromegaly. Lungs:  Respirations even and unlabored.  Clear throughout to auscultation.   No wheezes, crackles, or rhonchi. No acute distress. Heart:  Regular rate and rhythm; no murmurs, clicks, rubs, or gallops. Abdomen:  Normal bowel sounds.  No bruits.  2 old scars in bilateral subcostal margins. Mild lower abdominal tenderness , non-distended without masses, hepatosplenomegaly or hernias noted.  No guarding or rebound tenderness.   Rectal: Nor performed Msk:  Symmetrical without gross deformities. Good, equal movement & strength bilaterally. Pulses:  Normal pulses noted. Extremities:  No clubbing or edema.  No cyanosis. Neurologic:  Alert and oriented x3;  grossly normal neurologically. Skin:  Intact without significant lesions or rashes. No jaundice. Lymph Nodes:  No significant cervical adenopathy. Psych:  Alert and  cooperative. Normal mood and affect.  Imaging Studies: No recent abdominal imaging  Assessment and Plan:   AISHWARYA SHIPLETT is a 76 y.o. Black with History of lung cancer, lupus currently in remission with IBS-constipation partially controlled with fiber supplements. I will add MiraLAX once a day and continue fiber supplements daily. She will follow up with me in 2 months  Follow up in 2 months   Cephas Darby, MD

## 2017-01-01 ENCOUNTER — Ambulatory Visit: Payer: Medicare PPO | Admitting: Nurse Practitioner

## 2017-01-08 ENCOUNTER — Telehealth: Payer: Self-pay

## 2017-01-08 ENCOUNTER — Other Ambulatory Visit: Payer: Self-pay

## 2017-01-08 DIAGNOSIS — F5104 Psychophysiologic insomnia: Secondary | ICD-10-CM

## 2017-01-08 DIAGNOSIS — I10 Essential (primary) hypertension: Secondary | ICD-10-CM

## 2017-01-08 MED ORDER — LORAZEPAM 0.5 MG PO TABS: 0.5000 mg | ORAL_TABLET | Freq: Every evening | ORAL | 1 refills | Status: DC | PRN

## 2017-01-08 MED ORDER — NEBIVOLOL HCL 5 MG PO TABS: 2.5000 mg | ORAL_TABLET | Freq: Every day | ORAL | 1 refills | Status: DC

## 2017-01-08 NOTE — Telephone Encounter (Signed)
Patient called requesting a refill on Bystolic 5mg  and Lorazepam 0.5mg .  This needs to be sent to [facility].

## 2017-01-08 NOTE — Telephone Encounter (Signed)
Rx send to Cassell Smiles, DNP, AGPCNP-BC for approval.

## 2017-02-26 ENCOUNTER — Encounter: Payer: Self-pay | Admitting: Nurse Practitioner

## 2017-02-26 ENCOUNTER — Ambulatory Visit: Payer: Medicare PPO | Admitting: Nurse Practitioner

## 2017-02-26 ENCOUNTER — Other Ambulatory Visit: Payer: Self-pay

## 2017-02-26 VITALS — BP 109/42 | HR 60 | Temp 97.9°F | Ht 65.0 in | Wt 129.2 lb

## 2017-02-26 DIAGNOSIS — F5104 Psychophysiologic insomnia: Secondary | ICD-10-CM | POA: Diagnosis not present

## 2017-02-26 DIAGNOSIS — M3219 Other organ or system involvement in systemic lupus erythematosus: Secondary | ICD-10-CM | POA: Diagnosis not present

## 2017-02-26 DIAGNOSIS — R768 Other specified abnormal immunological findings in serum: Secondary | ICD-10-CM

## 2017-02-26 DIAGNOSIS — E269 Hyperaldosteronism, unspecified: Secondary | ICD-10-CM

## 2017-02-26 DIAGNOSIS — J439 Emphysema, unspecified: Secondary | ICD-10-CM

## 2017-02-26 DIAGNOSIS — K582 Mixed irritable bowel syndrome: Secondary | ICD-10-CM | POA: Diagnosis not present

## 2017-02-26 MED ORDER — LORAZEPAM 0.5 MG PO TABS: 0.5000 mg | ORAL_TABLET | Freq: Every evening | ORAL | 5 refills | Status: DC | PRN

## 2017-02-26 NOTE — Progress Notes (Signed)
Subjective:    [name], female    DOB: May 13, 1940, 76 y.o.   MRN: [id number]  Margaret Black is a 76 y.o. female presenting on 02/26/2017 for Anxiety   HPI Anxiety/Insomnia Lorazepam refill requested. Takes every night for sleep.   3pm has increased reactivity as that is the time she feels her lorazepam completely wears off.  For example, more "jumpy" with loud noises, irritable with others.  COPD No shortness of breath at this time.  Pt has not continued her spiriva, but did benefit for several months when she was having an acute bronchitis exacerbation.    Arthralgia Joints "feel big and achy."  Pt states she has tested positive for rheumatoid factor in blood without prior symptoms.  Pt notes her pain is worst in am for first 30 minutes of the day, then does improve.  Pt has pain daily, but reports no fusion of joints or decreased ROM at this time.  As with other medical conditions for Margaret Black, she states she prefers to proceed with conservative management and does not desire specialist appointments.  IBS - controlled w/ benefiber and stool softener and is satisfied currently.  Osteopenia - No additional medications desired.  Pt has taken alendronate in past and has reached her max recommended years of therapy.  Pt has not taken and does not desire to take injectable medications.  Lupus - Pt reports she does not like the risks of taking plaquenil with side effect of vision/blindness as she is already completely blind in left eye.  She only has cutaneous symptoms currently and is not bothered by this cosmetically.  Declines additional therapies.  Hyperaldosteronism - Pt is not currently experiencing any symptoms as a result of this condition.  Pt is being managed for hypertension (See below) and chronic kidney disease (nephrology at [facility]).  Hypertension - She is not checking BP at home or outside of clinic.    - Current medications: nebivolol 2.5 mg once daily, nifedipine 60  mg once daily, spironolactone 25 mg twice daily, tolerating well without side effects - She is not currently symptomatic. - Pt denies headache, lightheadedness, dizziness, changes in vision, chest tightness/pressure, palpitations, leg swelling, sudden loss of speech or loss of consciousness. - She  reports an exercise routine that includes walking, 5-6 days per week. - Her diet is moderate in salt, low in fat, and moderate in carbohydrates.   Social History   Tobacco Use  . Smoking status: Former Smoker    Types: Cigarettes    Last attempt to quit: 07/04/1991    Years since quitting: 25.6  . Smokeless tobacco: Never Used  . Tobacco comment: Quit in early 1980's.  Substance Use Topics  . Alcohol use: No  . Drug use: No    Review of Systems Per HPI unless specifically indicated above     Objective:    BP (!) 109/42 (BP Location: Right Arm, Patient Position: Sitting, Cuff Size: Normal)   Pulse 60   Temp 97.9 F (36.6 C) (Oral)   Ht 5\' 5"  (1.651 m)   Wt 129 lb 3.2 oz (58.6 kg)   BMI 21.50 kg/m   Wt Readings from Last 3 Encounters:  02/26/17 129 lb 3.2 oz (58.6 kg)  12/25/16 131 lb 3.2 oz (59.5 kg)  12/02/16 131 lb (59.4 kg)    Physical Exam  General - healthy weight, well-appearing, NAD HEENT - Normocephalic, atraumatic, PERRL, EOMI, patent nares w/o congestion, oropharynx clear, MMM Neck - supple,  non-tender, no LAD, no thyroid gland palpable on exam (expected finding), no carotid bruit Heart - RRR, no murmurs heard Lungs - Clear throughout all lobes, no wheezing, crackles, or rhonchi. Normal work of breathing. Extremeties - non-tender, no edema, cap refill < 2 seconds, peripheral pulses intact +2 bilaterally, normal ROM, no spinal bony tenderness Skin - warm, dry, no rashes Neuro - awake, alert, oriented x3, intact muscle strength 5/5 bilaterally, intact distal sensation to light touch, normal coordination, normal gait Psych - Normal mood and affect, normal behavior    Results for orders placed or performed in visit on 07/25/16  COMPLETE METABOLIC PANEL WITH GFR  Result Value Ref Range   Sodium 140 135 - 146 mmol/L   Potassium 4.4 3.5 - 5.3 mmol/L   Chloride 104 98 - 110 mmol/L   CO2 26 20 - 31 mmol/L   Glucose, Bld 89 65 - 99 mg/dL   BUN 30 (H) 7 - 25 mg/dL   Creat 1.33 (H) 0.60 - 0.93 mg/dL   Total Bilirubin 0.8 0.2 - 1.2 mg/dL   Alkaline Phosphatase 77 33 - 130 U/L   AST 18 10 - 35 U/L   ALT 15 6 - 29 U/L   Total Protein 7.2 6.1 - 8.1 g/dL   Albumin 4.3 3.6 - 5.1 g/dL   Calcium 9.9 8.6 - 10.4 mg/dL   GFR, Est African American 45 (L) >=60 mL/min   GFR, Est Non African American 39 (L) >=60 mL/min  CBC  Result Value Ref Range   WBC 5.0 3.8 - 10.8 K/uL   RBC 3.85 3.80 - 5.10 MIL/uL   Hemoglobin 12.3 11.7 - 15.5 g/dL   HCT 37.8 35.0 - 45.0 %   MCV 98.2 80.0 - 100.0 fL   MCH 31.9 27.0 - 33.0 pg   MCHC 32.5 32.0 - 36.0 g/dL   RDW 13.8 11.0 - 15.0 %   Platelets 303 140 - 400 K/uL   MPV 9.4 7.5 - 12.5 fL  Lipid panel  Result Value Ref Range   Cholesterol 179 <200 mg/dL   Triglycerides 105 <150 mg/dL   HDL 50 (L) >50 mg/dL   Total CHOL/HDL Ratio 3.6 <5.0 Ratio   VLDL 21 <30 mg/dL   LDL Cholesterol 108 (H) <100 mg/dL  TSH  Result Value Ref Range   TSH 1.01 mIU/L  T4, free  Result Value Ref Range   Free T4 1.7 0.8 - 1.8 ng/dL  Thyroglobulin antibody  Result Value Ref Range   Thyroglobulin Ab 3 (H) <2 IU/mL  Thyroglobulin Level  Result Value Ref Range   Thyroglobulin <0.1 (L) ng/mL      Assessment & Plan:   Problem List Items Addressed This Visit      Respiratory   COPD (chronic obstructive pulmonary disease) ([place])    Pt w/ stable COPD and not currently requiring inhalers.  Pt used Spiriva and albuterol regularly for about 3 months, but has not required use of medication over last 4-6 weeks.  In office spirometry confirmed mild obstructive disease.  Plan:  1. Consider intermittent use of spiriva if needed in future. 2.  Continue albuterol 1-2 puffs Q6 hr as needed for wheezing or shortness of breath. 3. Follow up 6 months or sooner if symptoms worsen.        Digestive   IBS    Currently asymptomatic.  Pt continues benefiber and stool softener.  Has stopped dicyclomine.  Plan: 1. Continue dietary management and OTC medications. 2. Followup as needed.  Endocrine   HYPERALDOSTERONISM UNSPECIFIED    Pt has hyperaldosteronism as previously confirmed by labs in 2012.  Pt w/ resistant hypertension in past and is currently controlled on spironolactone 25 mg bid, nifedipine, nebivolol, ramipril. - No current complications identified.  Hypertension management is sufficient.  Plan: 1. Continue monitoring for decline in kidney function and worsening hypertension.  2. Followup once annually.        Other   Insomnia    Stable on lorazepam 0.5 mg at bedtime.  Pt requests refill.  Has taken this medication for several years and is currently satisfied with the results.  She is not experiencing dizziness or daytime drowsiness.  Plan: 1. Discussed side effects and risks of long-term benzodiazepine use.  Pt verbalizes understanding and desires to continue medication.  2. Continue lorazepam 0.5 mg once daily.  PMP aware reviewed for substances previously prescribed and is consistent w/ pt report.  Refill provided early r/t transition back to mail order pharmacy. 3. Follow up every 6 months. Pt verbalizes understanding.      Relevant Medications   LORazepam (ATIVAN) 0.5 MG tablet   Systemic lupus ([place]) - Primary    Pt has stopped taking all immunosuppressive medications.  Is no longer taking plaquenil and has completed methotrexate in past.  Pt w/ cutaneous symptoms most predominantly.  Plan 1. Reviewed probability that COPD and kidney function could be worsened by uncontrolled lupus.  Pt verbalizes understanding.  Desires to continue monitoring these disease processes and avoid high risk medications.   Most specifically, pt is concerned for blindness. 2. Followup at least once per year.      Rheumatoid factor positive    Worsening w/ daily early am arthralgias.  Pt is currently taking occasional ibuprofen.  States she does not want to have any additional specialist visits.  Plan: 1. Discussed rheumatology referral. Pt declines.  Asks about treatments and discussed that treatment of lupus and RA would be with similar medications for immunosuppression.  Pt declines these therapies at this time.  Cautioned pt about possibility of joint effusion/swelling, eventually joint fusion. 2. Continue daily activity as tolerated. 3. Continue ibuprofen 200-400 mg 2-3 times daily as needed for pain.  Pt currently taking once daily occasionally. 4. Followup as needed.         Meds ordered this encounter  Medications  . LORazepam (ATIVAN) 0.5 MG tablet    Sig: Take 1 tablet (0.5 mg total) by mouth at bedtime as needed for anxiety or sleep.    Dispense:  30 tablet    Refill:  5    Order Specific Question:   Supervising Provider    Answer:   Olin Hauser [[id number]]      Follow up plan: Return in about 6 months (around 08/27/2017) for anxiety, hypertension.  A total of 40 minutes was spent face-to-face with this patient. Greater than 50% of this time was spent in counseling and coordination of care with the patient.  Cassell Smiles, DNP, AGPCNP-BC Adult Gerontology Primary Care Nurse Practitioner [facility] 03/04/2017, 10:55 AM

## 2017-02-26 NOTE — Patient Instructions (Addendum)
[place], Thank you for coming in to clinic today.  1. No changes on your medications today.   Please schedule a follow-up appointment with Cassell Smiles, AGNP. Return in about 6 months (around 08/27/2017) for anxiety, hypertension.  If you have any other questions or concerns, please feel free to call the clinic or send a message through [place]. You may also schedule an earlier appointment if necessary.  You will receive a survey after today's visit either digitally by e-mail or paper by [employer]. Your experiences and feedback matter to [place].  Please respond so we know how we are doing as we provide care for you.   Cassell Smiles, DNP, AGNP-BC Adult Gerontology Nurse Practitioner [place]

## 2017-03-04 ENCOUNTER — Encounter: Payer: Self-pay | Admitting: Nurse Practitioner

## 2017-03-04 NOTE — Assessment & Plan Note (Signed)
Pt has hyperaldosteronism as previously confirmed by labs in 2012.  Pt w/ resistant hypertension in past and is currently controlled on spironolactone 25 mg bid, nifedipine, nebivolol, ramipril. - No current complications identified.  Hypertension management is sufficient.  Plan: 1. Continue monitoring for decline in kidney function and worsening hypertension.  2. Followup once annually.

## 2017-03-04 NOTE — Assessment & Plan Note (Signed)
Stable on lorazepam 0.5 mg at bedtime.  Pt requests refill.  Has taken this medication for several years and is currently satisfied with the results.  She is not experiencing dizziness or daytime drowsiness.  Plan: 1. Discussed side effects and risks of long-term benzodiazepine use.  Pt verbalizes understanding and desires to continue medication.  2. Continue lorazepam 0.5 mg once daily.  PMP aware reviewed for substances previously prescribed and is consistent w/ pt report.  Refill provided early r/t transition back to mail order pharmacy. 3. Follow up every 6 months. Pt verbalizes understanding.

## 2017-03-04 NOTE — Assessment & Plan Note (Signed)
Currently asymptomatic.  Pt continues benefiber and stool softener.  Has stopped dicyclomine.  Plan: 1. Continue dietary management and OTC medications. 2. Followup as needed.

## 2017-03-04 NOTE — Assessment & Plan Note (Signed)
Pt w/ stable COPD and not currently requiring inhalers.  Pt used Spiriva and albuterol regularly for about 3 months, but has not required use of medication over last 4-6 weeks.  In office spirometry confirmed mild obstructive disease.  Plan:  1. Consider intermittent use of spiriva if needed in future. 2. Continue albuterol 1-2 puffs Q6 hr as needed for wheezing or shortness of breath. 3. Follow up 6 months or sooner if symptoms worsen.

## 2017-03-04 NOTE — Assessment & Plan Note (Signed)
Pt has stopped taking all immunosuppressive medications.  Is no longer taking plaquenil and has completed methotrexate in past.  Pt w/ cutaneous symptoms most predominantly.  Plan 1. Reviewed probability that COPD and kidney function could be worsened by uncontrolled lupus.  Pt verbalizes understanding.  Desires to continue monitoring these disease processes and avoid high risk medications.  Most specifically, pt is concerned for blindness. 2. Followup at least once per year.

## 2017-03-04 NOTE — Assessment & Plan Note (Signed)
Worsening w/ daily early am arthralgias.  Pt is currently taking occasional ibuprofen.  States she does not want to have any additional specialist visits.  Plan: 1. Discussed rheumatology referral. Pt declines.  Asks about treatments and discussed that treatment of lupus and RA would be with similar medications for immunosuppression.  Pt declines these therapies at this time.  Cautioned pt about possibility of joint effusion/swelling, eventually joint fusion. 2. Continue daily activity as tolerated. 3. Continue ibuprofen 200-400 mg 2-3 times daily as needed for pain.  Pt currently taking once daily occasionally. 4. Followup as needed.

## 2017-03-25 DIAGNOSIS — M329 Systemic lupus erythematosus, unspecified: Secondary | ICD-10-CM | POA: Diagnosis not present

## 2017-03-25 DIAGNOSIS — E119 Type 2 diabetes mellitus without complications: Secondary | ICD-10-CM | POA: Diagnosis not present

## 2017-03-25 DIAGNOSIS — I10 Essential (primary) hypertension: Secondary | ICD-10-CM | POA: Diagnosis not present

## 2017-03-25 DIAGNOSIS — N183 Chronic kidney disease, stage 3 (moderate): Secondary | ICD-10-CM | POA: Diagnosis not present

## 2017-03-25 DIAGNOSIS — N119 Chronic tubulo-interstitial nephritis, unspecified: Secondary | ICD-10-CM | POA: Diagnosis not present

## 2017-03-25 DIAGNOSIS — D631 Anemia in chronic kidney disease: Secondary | ICD-10-CM | POA: Diagnosis not present

## 2017-03-31 DIAGNOSIS — I10 Essential (primary) hypertension: Secondary | ICD-10-CM | POA: Diagnosis not present

## 2017-03-31 DIAGNOSIS — N183 Chronic kidney disease, stage 3 (moderate): Secondary | ICD-10-CM | POA: Diagnosis not present

## 2017-04-20 DIAGNOSIS — M25511 Pain in right shoulder: Secondary | ICD-10-CM | POA: Diagnosis not present

## 2017-04-20 DIAGNOSIS — M7061 Trochanteric bursitis, right hip: Secondary | ICD-10-CM | POA: Diagnosis not present

## 2017-04-20 DIAGNOSIS — M1990 Unspecified osteoarthritis, unspecified site: Secondary | ICD-10-CM | POA: Diagnosis not present

## 2017-04-20 DIAGNOSIS — G8929 Other chronic pain: Secondary | ICD-10-CM | POA: Diagnosis not present

## 2017-04-20 DIAGNOSIS — M545 Low back pain: Secondary | ICD-10-CM | POA: Diagnosis not present

## 2017-04-23 ENCOUNTER — Telehealth: Payer: Self-pay | Admitting: Nurse Practitioner

## 2017-04-23 NOTE — Telephone Encounter (Signed)
Pt's husband had surgery recently so she canceled upcoming CT.  Her call back number is [phone]

## 2017-04-30 ENCOUNTER — Ambulatory Visit: Payer: Medicare PPO

## 2017-05-06 DIAGNOSIS — M7071 Other bursitis of hip, right hip: Secondary | ICD-10-CM | POA: Diagnosis not present

## 2017-05-06 DIAGNOSIS — M25511 Pain in right shoulder: Secondary | ICD-10-CM | POA: Diagnosis not present

## 2017-05-06 DIAGNOSIS — G8929 Other chronic pain: Secondary | ICD-10-CM | POA: Diagnosis not present

## 2017-05-06 DIAGNOSIS — M3500 Sicca syndrome, unspecified: Secondary | ICD-10-CM | POA: Diagnosis not present

## 2017-05-21 DIAGNOSIS — H43812 Vitreous degeneration, left eye: Secondary | ICD-10-CM | POA: Diagnosis not present

## 2017-05-21 DIAGNOSIS — H3582 Retinal ischemia: Secondary | ICD-10-CM | POA: Diagnosis not present

## 2017-05-21 DIAGNOSIS — H348122 Central retinal vein occlusion, left eye, stable: Secondary | ICD-10-CM | POA: Diagnosis not present

## 2017-05-21 DIAGNOSIS — M3219 Other organ or system involvement in systemic lupus erythematosus: Secondary | ICD-10-CM | POA: Diagnosis not present

## 2017-05-27 ENCOUNTER — Telehealth: Payer: Self-pay | Admitting: Nurse Practitioner

## 2017-05-27 MED ORDER — SPIRONOLACTONE 25 MG PO TABS: 25.0000 mg | ORAL_TABLET | Freq: Two times a day (BID) | ORAL | 1 refills | Status: DC

## 2017-05-27 NOTE — Telephone Encounter (Signed)
Pt needs a refill on spironolcatone sent to [facility] mail order pharmacy.  She is also going out of town and will be out of the lorazepam before she gets back.  She asked if she could get a prescription for 10 days worth of medication [facility] to [facility] [place].  Her call back number is [phone]

## 2017-05-27 NOTE — Telephone Encounter (Signed)
[facility] called and early authorize was approved for the patient. The pharmacist informed me that the patient would have to pay out of pocket, because she's picking the script up early. The pt was notified and she verbalize understanding, no questions or concern.

## 2017-05-27 NOTE — Telephone Encounter (Signed)
Please authorize an early refill with [facility]. Will not provide extra tablets.  Can do 10 tablets only if insurance will not cover the early fill.  Spironolactone sent.

## 2017-06-02 DIAGNOSIS — M25511 Pain in right shoulder: Secondary | ICD-10-CM | POA: Diagnosis not present

## 2017-06-02 DIAGNOSIS — G8929 Other chronic pain: Secondary | ICD-10-CM | POA: Diagnosis not present

## 2017-06-09 ENCOUNTER — Telehealth: Payer: Self-pay | Admitting: Nurse Practitioner

## 2017-06-09 MED ORDER — NIFEDIPINE ER OSMOTIC RELEASE 60 MG PO TB24: ORAL_TABLET | ORAL | 2 refills | Status: DC

## 2017-06-09 MED ORDER — LEVOTHYROXINE SODIUM 88 MCG PO TABS: 88.0000 ug | ORAL_TABLET | Freq: Every day | ORAL | 3 refills | Status: DC

## 2017-06-09 NOTE — Telephone Encounter (Signed)
Pt needs refills on nifedipine and synthroid sent to [facility] mail order.  Her call back number is [phone]

## 2017-06-12 ENCOUNTER — Ambulatory Visit: Payer: Medicare PPO | Admitting: Nurse Practitioner

## 2017-06-12 ENCOUNTER — Other Ambulatory Visit: Payer: Self-pay

## 2017-06-12 ENCOUNTER — Encounter: Payer: Self-pay | Admitting: Nurse Practitioner

## 2017-06-12 DIAGNOSIS — I10 Essential (primary) hypertension: Secondary | ICD-10-CM

## 2017-06-12 DIAGNOSIS — F419 Anxiety disorder, unspecified: Secondary | ICD-10-CM | POA: Diagnosis not present

## 2017-06-12 MED ORDER — NEBIVOLOL HCL 5 MG PO TABS: 5.0000 mg | ORAL_TABLET | Freq: Every day | ORAL | 1 refills | Status: DC

## 2017-06-12 NOTE — Patient Instructions (Addendum)
Margaret Black,   Thank you for coming in to clinic today.  INCREASE your nebivolol (Bystolic) to 5 mg tablet once daily.     Work on [employer] engineer, contracting.  Find ways to establish boundaries in various relationships and work for compromise.   Please schedule a follow-up appointment with Cassell Smiles, AGNP. Return in about 3 months (around 09/12/2017) for hypertension.  If you have any other questions or concerns, please feel free to call the clinic or send a message through [place]. You may also schedule an earlier appointment if necessary.  You will receive a survey after today's visit either digitally by e-mail or paper by [employer]. Your experiences and feedback matter to [place].  Please respond so we know how we are doing as we provide care for you.   Cassell Smiles, DNP, AGNP-BC Adult Gerontology Nurse Practitioner [facility], [facility]     Stress and Stress Management Stress is a normal reaction to life events. It is what you feel when life demands more than you are used to or more than you can handle. Some stress can be useful. For example, the stress reaction can help you catch the last bus of the day, study for a test, or meet a deadline at work. But stress that occurs too often or for too long can cause problems. It can affect your emotional health and interfere with relationships and normal daily activities. Too much stress can weaken your immune system and increase your risk for physical illness. If you already have a medical problem, stress can make it worse. What are the causes? All sorts of life events may cause stress. An event that causes stress for one person may not be stressful for another person. Major life events commonly cause stress. These may be positive or negative. Examples include losing your job, moving into a new home, getting married, having a baby, or losing a loved one. Less obvious life events may also cause stress, especially if they occur day after  day or in combination. Examples include working long hours, driving in traffic, caring for children, being in debt, or being in a difficult relationship. What are the signs or symptoms? Stress may cause emotional symptoms including, the following:  Anxiety. This is feeling worried, afraid, on edge, overwhelmed, or out of control.  Anger. This is feeling irritated or impatient.  Depression. This is feeling sad, down, helpless, or guilty.  Difficulty focusing, remembering, or making decisions.  Stress may cause physical symptoms, including the following:  Aches and pains. These may affect your head, neck, back, stomach, or other areas of your body.  Tight muscles or clenched jaw.  Low energy or trouble sleeping.  Stress may cause unhealthy behaviors, including the following:  Eating to feel better (overeating) or skipping meals.  Sleeping too little, too much, or both.  Working too much or putting off tasks (procrastination).  Smoking, drinking alcohol, or using drugs to feel better.  How is this diagnosed? Stress is diagnosed through an assessment by your health care provider. Your health care provider will ask questions about your symptoms and any stressful life events.Your health care provider will also ask about your medical history and may order blood tests or other tests. Certain medical conditions and medicine can cause physical symptoms similar to stress. Mental illness can cause emotional symptoms and unhealthy behaviors similar to stress. Your health care provider may refer you to a mental health professional for further evaluation. How is this treated? Stress  management is the recommended treatment for stress.The goals of stress management are reducing stressful life events and coping with stress in healthy ways. Techniques for reducing stressful life events include the following:  Stress identification. Self-monitor for stress and identify what causes stress for you.  These skills may help you to avoid some stressful events.  Time management. Set your priorities, keep a calendar of events, and learn to say "no." These tools can help you avoid making too many commitments.  Techniques for coping with stress include the following:  Rethinking the problem. Try to think realistically about stressful events rather than ignoring them or overreacting. Try to find the positives in a stressful situation rather than focusing on the negatives.  Exercise. Physical exercise can release both physical and emotional tension. The key is to find a form of exercise you enjoy and do it regularly.  Relaxation techniques. These relax the body and mind. Examples include yoga, meditation, tai chi, biofeedback, deep breathing, progressive muscle relaxation, listening to music, being out in nature, journaling, and other hobbies. Again, the key is to find one or more that you enjoy and can do regularly.  Healthy lifestyle. Eat a balanced diet, get plenty of sleep, and do not smoke. Avoid using alcohol or drugs to relax.  Strong support network. Spend time with family, friends, or other people you enjoy being around.Express your feelings and talk things over with someone you trust.  Counseling or talktherapy with a mental health professional may be helpful if you are having difficulty managing stress on your own. Medicine is typically not recommended for the treatment of stress.Talk to your health care provider if you think you need medicine for symptoms of stress. Follow these instructions at home:  Keep all follow-up visits as directed by your health care provider.  Take all medicines as directed by your health care provider. Contact a health care provider if:  Your symptoms get worse or you start having new symptoms.  You feel overwhelmed by your problems and can no longer manage them on your own. Get help right away if:  You feel like hurting yourself or someone else. This  information is not intended to replace advice given to you by your health care provider. Make sure you discuss any questions you have with your health care provider. Document Released: 09/03/2000 Document Revised: 08/16/2015 Document Reviewed: 11/02/2012 Elsevier Interactive Patient Education  2017 [employer].

## 2017-06-12 NOTE — Assessment & Plan Note (Signed)
Situational worsening of anxiety.  Pt continues to take Xanax 0.5 mg once daily at bedtime for anxiety and sleep.  She is currently having worsening anxiety 2/2 multiple strains on emotional support of family members.  Husband with increased caregiving needs and daughter needing support as well.  Pt without adequate coping and restorative time.  Plan: 1. Encouraged pt to keep her own social support system ([facility] - no attendance in 3 months) 2. Encouraged regular physical activity. 3. Establish boundaries for relationships as needed.  Compromise when needed. 4. Encouraged pt to consider counseling if needed. 5. Continue only Xanax at bedtime. Pt declined additional medication at this time. 6. Followup 3 months or sooner if needed.

## 2017-06-12 NOTE — Assessment & Plan Note (Signed)
Currently elevated and uncontrolled hypertension with readings higher at home than in clinic.  BP goal < 130/80.  Pt is continuing to work on lifestyle modifications, but has had some dietary indiscretions in the last 2 weeks. Taking medications and tolerating well without side effects.  Complicated by CKD III, prior retinal detachment with uncontrolled htn.  Plan: 1. Continue taking medications: enalapril, nifedipine - Increase Bystolic to 5 mg once daily instead of 2.5 mg once daily. 2. Obtain labs with next appointment  3. Encouraged heart healthy diet and increasing exercise to 30 minutes most days of the week. 4. Check BP 1-2 x per week at home, keep log, and bring to clinic at next appointment. 5. Follow up 3 months and sooner if needed.

## 2017-06-12 NOTE — Progress Notes (Signed)
Subjective:    [name], female    DOB: [birth year], 77 y.o.   MRN: [id number]  Margaret Black is a 77 y.o. female presenting on 06/12/2017 for Hypertension (elevated bp, pt not sure if it could be related to stress. intermitent slight palpations x 1.5 weeks, pt not sure it could be related to her diet. 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  Patient notes she has just completed a visit with her daughter who lives in [place].  Her daughter is a Marine scientist and finds stress relief and recharging when she spends time debriefing with Margaret Black.  This visit, pt notes she was unable to successfully manage the stress of listening to her daughter's grievances the same as in past visits.  Margaret Black found great fulfillment with her time with granddaughters, however, so the visit did provide some benefit. - She has had associated symptoms of intermittent heart palpitations, disorientation, ligtheadedness, and decreased concentration.  She has had more caffeine than in past, but is otherwise doing well with diet. - She notes disruption of social support system with [facility] three times a week since January when her husband started having more medical problems. - Pt walks regularly and notes that walking helps control anxiety and moods.  Hypertension - She is checking BP at home or outside of clinic.  Readings 170/90.   - Current medications: enalapril 2.5 mg once daily, nebivolol 2.5 mg once daily, nifedipine XL 60 mg once daily, tolerating well without side effects.  Nebivolol was reduced from 5mg  to 2.5 mg once daily after dizziness/lightneadedness several months ago. - She is symptomatic with mild headache over last several days. - Pt denies dizziness, changes in vision, chest tightness/pressure, leg swelling,  sudden loss of speech or loss of consciousness. - She  reports an exercise routine that includes walking 5-6 days per week - Her diet is moderate in salt, moderate in fat, and moderate in carbohydrates.  - Pt remains concerned about her symptoms because she previously had retinal detachment as an adverse effect of uncontrolled hypertension  Social History   Tobacco Use  . Smoking status: Former Smoker    Types: Cigarettes    Last attempt to quit: 07/04/1991    Years since quitting: 25.9  . Smokeless tobacco: Never Used  . Tobacco comment: Quit in early 1980's.  Substance Use Topics  . Alcohol use: No  . Drug use: No    Review of Systems Per HPI unless specifically indicated above     Objective:    BP (!) 148/46   Pulse 64   Temp 98 F (36.7 C) (Oral)   Ht 5\' 5"  (1.651 m)   Wt 130 lb 12.8 oz (59.3 kg)   BMI 21.77 kg/m   Wt Readings from Last 3 Encounters:  06/12/17 130 lb 12.8 oz (59.3 kg)  02/26/17 129 lb 3.2 oz (58.6 kg)  12/25/16 131 lb 3.2 oz (59.5 kg)    Physical Exam  Constitutional: She is oriented to person, place, and time. Vital signs are normal. She appears well-developed and well-nourished. No distress.  HENT:  Head: Normocephalic and atraumatic.  Neck: Normal range of motion. Neck supple. Carotid bruit is not present.  Cardiovascular: Normal rate, regular rhythm, S1 normal, S2 normal, normal heart sounds and intact distal pulses.  Pulmonary/Chest: Effort  normal and breath sounds normal. No respiratory distress.  Musculoskeletal: She exhibits no edema (pedal).  Neurological: She is alert and oriented to person, place, and time.  Skin: Skin is warm and dry.  Psychiatric: She has a normal mood and affect. Her behavior is normal.  Vitals reviewed.    Results for orders placed or performed in visit on 07/25/16  COMPLETE METABOLIC PANEL WITH GFR  Result Value Ref Range   Sodium 140 135 - 146 mmol/L   Potassium 4.4 3.5 - 5.3 mmol/L   Chloride 104 98 - 110  mmol/L   CO2 26 20 - 31 mmol/L   Glucose, Bld 89 65 - 99 mg/dL   BUN 30 (H) 7 - 25 mg/dL   Creat 1.33 (H) 0.60 - 0.93 mg/dL   Total Bilirubin 0.8 0.2 - 1.2 mg/dL   Alkaline Phosphatase 77 33 - 130 U/L   AST 18 10 - 35 U/L   ALT 15 6 - 29 U/L   Total Protein 7.2 6.1 - 8.1 g/dL   Albumin 4.3 3.6 - 5.1 g/dL   Calcium 9.9 8.6 - 10.4 mg/dL   GFR, Est African American 45 (L) >=60 mL/min   GFR, Est Non African American 39 (L) >=60 mL/min  CBC  Result Value Ref Range   WBC 5.0 3.8 - 10.8 K/uL   RBC 3.85 3.80 - 5.10 MIL/uL   Hemoglobin 12.3 11.7 - 15.5 g/dL   HCT 37.8 35.0 - 45.0 %   MCV 98.2 80.0 - 100.0 fL   MCH 31.9 27.0 - 33.0 pg   MCHC 32.5 32.0 - 36.0 g/dL   RDW 13.8 11.0 - 15.0 %   Platelets 303 140 - 400 K/uL   MPV 9.4 7.5 - 12.5 fL  Lipid panel  Result Value Ref Range   Cholesterol 179 <200 mg/dL   Triglycerides 105 <150 mg/dL   HDL 50 (L) >50 mg/dL   Total CHOL/HDL Ratio 3.6 <5.0 Ratio   VLDL 21 <30 mg/dL   LDL Cholesterol 108 (H) <100 mg/dL  TSH  Result Value Ref Range   TSH 1.01 mIU/L  T4, free  Result Value Ref Range   Free T4 1.7 0.8 - 1.8 ng/dL  Thyroglobulin antibody  Result Value Ref Range   Thyroglobulin Ab 3 (H) <2 IU/mL  Thyroglobulin Level  Result Value Ref Range   Thyroglobulin <0.1 (L) ng/mL      Assessment & Plan:   Problem List Items Addressed This Visit      Cardiovascular and Mediastinum   Essential hypertension    Currently elevated and uncontrolled hypertension with readings higher at home than in clinic.  BP goal < 130/80.  Pt is continuing to work on lifestyle modifications, but has had some dietary indiscretions in the last 2 weeks. Taking medications and tolerating well without side effects.  Complicated by CKD III, prior retinal detachment with uncontrolled htn.  Plan: 1. Continue taking medications: enalapril, nifedipine - Increase Bystolic to 5 mg once daily instead of 2.5 mg once daily. 2. Obtain labs with next appointment  3.  Encouraged heart healthy diet and increasing exercise to 30 minutes most days of the week. 4. Check BP 1-2 x per week at home, keep log, and bring to clinic at next appointment. 5. Follow up 3 months and sooner if needed.        Relevant Medications   nebivolol (BYSTOLIC) 5 MG tablet     Other   Anxiety    Situational worsening of  anxiety.  Pt continues to take Xanax 0.5 mg once daily at bedtime for anxiety and sleep.  She is currently having worsening anxiety 2/2 multiple strains on emotional support of family members.  Husband with increased caregiving needs and daughter needing support as well.  Pt without adequate coping and restorative time.  Plan: 1. Encouraged pt to keep her own social support system ([facility] - no attendance in 3 months) 2. Encouraged regular physical activity. 3. Establish boundaries for relationships as needed.  Compromise when needed. 4. Encouraged pt to consider counseling if needed. 5. Continue only Xanax at bedtime. Pt declined additional medication at this time. 6. Followup 3 months or sooner if needed.         Meds ordered this encounter  Medications  . nebivolol (BYSTOLIC) 5 MG tablet    Sig: Take 1 tablet (5 mg total) by mouth daily.    Dispense:  90 tablet    Refill:  1    Order Specific Question:   Supervising Provider    Answer:   Olin Hauser [[id number]]    Follow up plan: Return in about 3 months (around 09/12/2017) for hypertension.  A total of 25 minutes was spent face-to-face with this patient. Greater than 50% of this time was spent in counseling and coordination of care with the patient.   Cassell Smiles, DNP, AGPCNP-BC Adult Gerontology Primary Care Nurse Practitioner [facility] 06/12/2017, 7:22 PM

## 2017-07-20 DIAGNOSIS — M329 Systemic lupus erythematosus, unspecified: Secondary | ICD-10-CM | POA: Diagnosis not present

## 2017-07-20 DIAGNOSIS — I10 Essential (primary) hypertension: Secondary | ICD-10-CM | POA: Diagnosis not present

## 2017-07-20 DIAGNOSIS — N183 Chronic kidney disease, stage 3 (moderate): Secondary | ICD-10-CM | POA: Diagnosis not present

## 2017-07-20 DIAGNOSIS — D631 Anemia in chronic kidney disease: Secondary | ICD-10-CM | POA: Diagnosis not present

## 2017-07-20 DIAGNOSIS — N119 Chronic tubulo-interstitial nephritis, unspecified: Secondary | ICD-10-CM | POA: Diagnosis not present

## 2017-07-23 DIAGNOSIS — I10 Essential (primary) hypertension: Secondary | ICD-10-CM | POA: Diagnosis not present

## 2017-07-23 DIAGNOSIS — N183 Chronic kidney disease, stage 3 (moderate): Secondary | ICD-10-CM | POA: Diagnosis not present

## 2017-08-07 ENCOUNTER — Other Ambulatory Visit: Payer: Self-pay

## 2017-08-07 ENCOUNTER — Ambulatory Visit: Payer: Medicare PPO | Admitting: Nurse Practitioner

## 2017-08-07 ENCOUNTER — Encounter: Payer: Self-pay | Admitting: Nurse Practitioner

## 2017-08-07 VITALS — BP 108/47 | HR 61 | Ht 65.0 in | Wt 130.8 lb

## 2017-08-07 DIAGNOSIS — L900 Lichen sclerosus et atrophicus: Secondary | ICD-10-CM

## 2017-08-07 MED ORDER — CLOBETASOL PROPIONATE 0.05 % EX OINT: TOPICAL_OINTMENT | CUTANEOUS | 1 refills | Status: DC

## 2017-08-07 NOTE — Patient Instructions (Addendum)
[name],   Thank you for coming in to clinic today.  1. START clobetasol 0.05% ointment.  Apply thin layer with fingertip amount nightly.  Use for 6-12 weeks.  Then, return to clinic for re-evaluation.    2. Continue good hygiene as you have been.  Please schedule a follow-up appointment with Cassell Smiles, AGNP. Return in about 3 months (around 8/98/4210) for lichen sclerosus.  If you have any other questions or concerns, please feel free to call the clinic or send a message through [place]. You may also schedule an earlier appointment if necessary.  You will receive a survey after today's visit either digitally by e-mail or paper by [employer]. Your experiences and feedback matter to [place].  Please respond so we know how we are doing as we provide care for you.   Cassell Smiles, DNP, AGNP-BC Adult Gerontology Nurse Practitioner [place]

## 2017-08-07 NOTE — Progress Notes (Signed)
Subjective:    [name], female    DOB: [birth year], 77 y.o.   MRN: [id number]  Margaret Black is a 77 y.o. female presenting on 08/07/2017 for Vaginal Itching (x 3weeks, treated w/ monistat no improvement. )   HPI Vaginal itching Has tried monistat because of itching.  Has not had thick white discharge.  Now, is using topical cream again without improvement.  Patient is tired of itching and states she feels she is becoming raw 2/2 scratching.    Patient is changing underwear twice daily and is keeping good perineal hygiene.  Social History   Tobacco Use  . Smoking status: Former Smoker    Types: Cigarettes    Last attempt to quit: 07/04/1991    Years since quitting: 26.1  . Smokeless tobacco: Never Used  . Tobacco comment: Quit in early 1980's.  Substance Use Topics  . Alcohol use: No  . Drug use: No    Review of Systems Per HPI unless specifically indicated above     Objective:    BP (!) 108/47 (BP Location: Right Arm, Patient Position: Sitting, Cuff Size: Normal)   Pulse 61   Ht 5\' 5"  (1.651 m)   Wt 130 lb 12.8 oz (59.3 kg)   BMI 21.77 kg/m   Wt Readings from Last 3 Encounters:  08/07/17 130 lb 12.8 oz (59.3 kg)  06/12/17 130 lb 12.8 oz (59.3 kg)  02/26/17 129 lb 3.2 oz (58.6 kg)    Physical Exam  Constitutional: She is oriented to person, place, and time. She appears well-developed and well-nourished. No distress.  HENT:  Head: Normocephalic and atraumatic.  Cardiovascular: Normal rate, regular rhythm, S1 normal, S2 normal, normal heart sounds and intact distal pulses.  Pulmonary/Chest: Effort normal and breath sounds normal. No respiratory distress.  Genitourinary: No labial fusion. There is rash on the right labia. There is no tenderness, lesion or injury on the right labia. There is rash on the left labia. There is no tenderness, lesion or injury on the left labia.  Genitourinary Comments: Labial folds with mild erythema and white patches c/w  lichen sclerosis. Atrophic vaginal tissues, no lesions or injury.  Pathophysiologic discharge, but very little discharge present - tissues dry.  Neurological: She is alert and oriented to person, place, and time.  Skin: Skin is warm and dry.  Psychiatric: She has a normal mood and affect. Her behavior is normal.  Vitals reviewed.    Results for orders placed or performed in visit on 07/25/16  COMPLETE METABOLIC PANEL WITH GFR  Result Value Ref Range   Sodium 140 135 - 146 mmol/L   Potassium 4.4 3.5 - 5.3 mmol/L   Chloride 104 98 - 110 mmol/L   CO2 26 20 - 31 mmol/L   Glucose, Bld 89 65 - 99 mg/dL   BUN 30 (H) 7 - 25 mg/dL   Creat 1.33 (H) 0.60 - 0.93 mg/dL   Total Bilirubin 0.8 0.2 - 1.2 mg/dL   Alkaline Phosphatase 77 33 - 130 U/L   AST 18 10 - 35 U/L   ALT 15 6 - 29 U/L   Total Protein 7.2 6.1 - 8.1 g/dL   Albumin 4.3 3.6 - 5.1 g/dL   Calcium 9.9 8.6 - 10.4 mg/dL   GFR, Est African American 45 (L) >=60 mL/min   GFR, Est Non African American 39 (L) >=60 mL/min  CBC  Result Value Ref Range   WBC 5.0 3.8 - 10.8 K/uL  RBC 3.85 3.80 - 5.10 MIL/uL   Hemoglobin 12.3 11.7 - 15.5 g/dL   HCT 37.8 35.0 - 45.0 %   MCV 98.2 80.0 - 100.0 fL   MCH 31.9 27.0 - 33.0 pg   MCHC 32.5 32.0 - 36.0 g/dL   RDW 13.8 11.0 - 15.0 %   Platelets 303 140 - 400 K/uL   MPV 9.4 7.5 - 12.5 fL  Lipid panel  Result Value Ref Range   Cholesterol 179 <200 mg/dL   Triglycerides 105 <150 mg/dL   HDL 50 (L) >50 mg/dL   Total CHOL/HDL Ratio 3.6 <5.0 Ratio   VLDL 21 <30 mg/dL   LDL Cholesterol 108 (H) <100 mg/dL  TSH  Result Value Ref Range   TSH 1.01 mIU/L  T4, free  Result Value Ref Range   Free T4 1.7 0.8 - 1.8 ng/dL  Thyroglobulin antibody  Result Value Ref Range   Thyroglobulin Ab 3 (H) <2 IU/mL  Thyroglobulin Level  Result Value Ref Range   Thyroglobulin <0.1 (L) ng/mL      Assessment & Plan:   Problem List Items Addressed This Visit    None    Visit Diagnoses    Lichen sclerosus     -  Primary   Relevant Medications   clobetasol ointment (TEMOVATE) 0.05 %    Mildly severe lichen sclerosus with pruritus.  Patient currently declines GYN referral.  Will start clobetasol ointment application nightly x 1-63 weeks.  Reviewed risks of topical steroid skin atrophy.  Then return for re-evaluation. May need long-term use or cessation of clobetasol.  Continue good perineal hygiene.  Follow-up 3 months.    Meds ordered this encounter  Medications  . clobetasol ointment (TEMOVATE) 0.05 %    Sig: Apply a fingertip amount to your vagina nightly for 6-12 weeks.    Dispense:  45 g    Refill:  1    Order Specific Question:   Supervising Provider    Answer:   Olin Hauser [[id number]]    Follow up plan: Return in about 3 months (around 8/45/3646) for lichen sclerosus.  Cassell Smiles, DNP, AGPCNP-BC Adult Gerontology Primary Care Nurse Practitioner [facility] 08/07/2017, 2:18 PM

## 2017-08-31 ENCOUNTER — Telehealth: Payer: Self-pay | Admitting: Nurse Practitioner

## 2017-08-31 DIAGNOSIS — F5104 Psychophysiologic insomnia: Secondary | ICD-10-CM

## 2017-08-31 MED ORDER — LORAZEPAM 0.5 MG PO TABS: 0.5000 mg | ORAL_TABLET | Freq: Every evening | ORAL | 5 refills | Status: DC | PRN

## 2017-08-31 NOTE — Telephone Encounter (Signed)
Pt. Called requesting refill Lorazepam mail order. Pt also called back if medication was called in. Also wanted to know if she needed to follow up pass appt.she states that she was still taken  Medication and was  better Pt. Call back  # is  [phone]

## 2017-08-31 NOTE — Telephone Encounter (Signed)
Called pt to clarify.  Refill for lorazepam is provided.  Followup in August to September. - Pt also had question about clobetasol cream.  Continue for next 2 months unless symptoms have completely resolved.  Then she may stop. Followup in clinic in 2 months for additional evaluation

## 2017-09-20 ENCOUNTER — Encounter: Payer: Self-pay | Admitting: Nurse Practitioner

## 2017-11-10 ENCOUNTER — Other Ambulatory Visit: Payer: Self-pay

## 2017-11-10 MED ORDER — SPIRONOLACTONE 25 MG PO TABS: 25.0000 mg | ORAL_TABLET | Freq: Two times a day (BID) | ORAL | 2 refills | Status: DC

## 2017-11-19 DIAGNOSIS — N119 Chronic tubulo-interstitial nephritis, unspecified: Secondary | ICD-10-CM | POA: Diagnosis not present

## 2017-11-19 DIAGNOSIS — M329 Systemic lupus erythematosus, unspecified: Secondary | ICD-10-CM | POA: Diagnosis not present

## 2017-11-19 DIAGNOSIS — D631 Anemia in chronic kidney disease: Secondary | ICD-10-CM | POA: Diagnosis not present

## 2017-11-19 DIAGNOSIS — I10 Essential (primary) hypertension: Secondary | ICD-10-CM | POA: Diagnosis not present

## 2017-11-19 DIAGNOSIS — N183 Chronic kidney disease, stage 3 (moderate): Secondary | ICD-10-CM | POA: Diagnosis not present

## 2017-11-26 DIAGNOSIS — N183 Chronic kidney disease, stage 3 (moderate): Secondary | ICD-10-CM | POA: Diagnosis not present

## 2017-11-26 DIAGNOSIS — I10 Essential (primary) hypertension: Secondary | ICD-10-CM | POA: Diagnosis not present

## 2017-12-01 DIAGNOSIS — G8929 Other chronic pain: Secondary | ICD-10-CM | POA: Diagnosis not present

## 2017-12-01 DIAGNOSIS — M25511 Pain in right shoulder: Secondary | ICD-10-CM | POA: Diagnosis not present

## 2017-12-01 DIAGNOSIS — M545 Low back pain: Secondary | ICD-10-CM | POA: Diagnosis not present

## 2017-12-02 ENCOUNTER — Other Ambulatory Visit (HOSPITAL_COMMUNITY): Payer: Self-pay | Admitting: Rheumatology

## 2017-12-02 ENCOUNTER — Other Ambulatory Visit: Payer: Self-pay | Admitting: Rheumatology

## 2017-12-02 DIAGNOSIS — M545 Low back pain, unspecified: Secondary | ICD-10-CM

## 2017-12-02 DIAGNOSIS — M455 Ankylosing spondylitis of thoracolumbar region: Secondary | ICD-10-CM

## 2017-12-02 DIAGNOSIS — G8929 Other chronic pain: Secondary | ICD-10-CM

## 2017-12-15 ENCOUNTER — Encounter: Payer: Self-pay | Admitting: Nurse Practitioner

## 2017-12-15 ENCOUNTER — Ambulatory Visit (INDEPENDENT_AMBULATORY_CARE_PROVIDER_SITE_OTHER): Payer: Medicare PPO | Admitting: Nurse Practitioner

## 2017-12-15 ENCOUNTER — Other Ambulatory Visit: Payer: Self-pay

## 2017-12-15 VITALS — BP 117/46 | HR 57 | Temp 98.4°F | Ht 65.0 in | Wt 130.2 lb

## 2017-12-15 DIAGNOSIS — E039 Hypothyroidism, unspecified: Secondary | ICD-10-CM

## 2017-12-15 DIAGNOSIS — L900 Lichen sclerosus et atrophicus: Secondary | ICD-10-CM

## 2017-12-15 DIAGNOSIS — I10 Essential (primary) hypertension: Secondary | ICD-10-CM | POA: Diagnosis not present

## 2017-12-15 DIAGNOSIS — E890 Postprocedural hypothyroidism: Secondary | ICD-10-CM

## 2017-12-15 MED ORDER — NEBIVOLOL HCL 5 MG PO TABS
5.0000 mg | ORAL_TABLET | Freq: Every day | ORAL | 1 refills | Status: DC
Start: 2017-12-15 — End: 2018-06-01

## 2017-12-15 MED ORDER — NIFEDIPINE ER OSMOTIC RELEASE 60 MG PO TB24: ORAL_TABLET | ORAL | 2 refills | Status: DC

## 2017-12-15 NOTE — Patient Instructions (Addendum)
Margaret Black,   Thank you for coming in to clinic today.  1. Continue Blood pressure medications without change today  2. STOP clobetasol ointment for now.  Use a vaginal moisturizer or coconut oil for lubrication.  Please schedule a follow-up appointment with Cassell Smiles, AGNP. Return in about 6 months (around 06/15/2018) for hypertension, vaginitis.    If you have any other questions or concerns, please feel free to call the clinic or send a message through [place]. You may also schedule an earlier appointment if necessary.  You will receive a survey after today's visit either digitally by e-mail or paper by [employer]. Your experiences and feedback matter to [place].  Please respond so we know how we are doing as we provide care for you.   Cassell Smiles, DNP, AGNP-BC Adult Gerontology Nurse Practitioner [place]

## 2017-12-15 NOTE — Progress Notes (Signed)
Subjective:    [name], female    DOB: 1940/07/24, 77 y.o.   MRN: [id number]  Margaret Black is a 77 y.o. female presenting on 12/15/2017 for Hypertension   HPI Hypertension - She is not checking BP at home or outside of clinic.    - Current medications: nebivolol 5 mg daily, nifedipine 50 mg daily, spironolactone 25 mg once daily (keep 2/2 Addison's Disease), tolerating well without side effects - She is not currently symptomatic. - Pt denies headache, lightheadedness, dizziness, changes in vision, chest tightness/pressure, palpitations, leg swelling, sudden loss of speech or loss of consciousness.  Atrophic Vaginitis Patient reports significant improvement in vaginal irritation and itching.   Thyroid - Taking levothyroxine 88 mcg once daily. - Is having very dry skin  - otherwise, feels euthyroid.  Social History   Tobacco Use  . Smoking status: Former Smoker    Types: Cigarettes    Last attempt to quit: 07/04/1991    Years since quitting: 26.4  . Smokeless tobacco: Never Used  . Tobacco comment: Quit in early 1980's.  Substance Use Topics  . Alcohol use: No  . Drug use: No    Review of Systems Per HPI unless specifically indicated above     Objective:    BP (!) 117/46 (BP Location: Right Arm, Patient Position: Sitting, Cuff Size: Normal)   Pulse (!) 57   Temp 98.4 F (36.9 C) (Oral)   Ht 5\' 5"  (1.651 m)   Wt 130 lb 3.2 oz (59.1 kg)   BMI 21.67 kg/m    Wt Readings from Last 3 Encounters:  12/15/17 130 lb 3.2 oz (59.1 kg)  08/07/17 130 lb 12.8 oz (59.3 kg)  06/12/17 130 lb 12.8 oz (59.3 kg)    Physical Exam  Constitutional: She is oriented to person, place, and time. She appears well-developed and well-nourished. No distress.  HENT:  Head: Normocephalic and atraumatic.  Neck: Normal range of motion. Neck supple. Carotid bruit is not present. No thyromegaly present.  Cardiovascular: Normal rate, regular rhythm, S1 normal, S2 normal, normal  heart sounds and intact distal pulses.  Pulmonary/Chest: Effort normal and breath sounds normal. No respiratory distress.  Musculoskeletal: She exhibits no edema (pedal).  Neurological: She is alert and oriented to person, place, and time.  Skin: Skin is warm and dry. Capillary refill takes less than 2 seconds.  Psychiatric: She has a normal mood and affect. Her behavior is normal. Judgment and thought content normal.  Vitals reviewed.  Results for orders placed or performed in visit on 07/25/16  COMPLETE METABOLIC PANEL WITH GFR  Result Value Ref Range   Sodium 140 135 - 146 mmol/L   Potassium 4.4 3.5 - 5.3 mmol/L   Chloride 104 98 - 110 mmol/L   CO2 26 20 - 31 mmol/L   Glucose, Bld 89 65 - 99 mg/dL   BUN 30 (H) 7 - 25 mg/dL   Creat 1.33 (H) 0.60 - 0.93 mg/dL   Total Bilirubin 0.8 0.2 - 1.2 mg/dL   Alkaline Phosphatase 77 33 - 130 U/L   AST 18 10 - 35 U/L   ALT 15 6 - 29 U/L   Total Protein 7.2 6.1 - 8.1 g/dL   Albumin 4.3 3.6 - 5.1 g/dL   Calcium 9.9 8.6 - 10.4 mg/dL   GFR, Est African American 45 (L) >=60 mL/min   GFR, Est Non African American 39 (L) >=60 mL/min  CBC  Result Value Ref Range  WBC 5.0 3.8 - 10.8 K/uL   RBC 3.85 3.80 - 5.10 MIL/uL   Hemoglobin 12.3 11.7 - 15.5 g/dL   HCT 37.8 35.0 - 45.0 %   MCV 98.2 80.0 - 100.0 fL   MCH 31.9 27.0 - 33.0 pg   MCHC 32.5 32.0 - 36.0 g/dL   RDW 13.8 11.0 - 15.0 %   Platelets 303 140 - 400 K/uL   MPV 9.4 7.5 - 12.5 fL  Lipid panel  Result Value Ref Range   Cholesterol 179 <200 mg/dL   Triglycerides 105 <150 mg/dL   HDL 50 (L) >50 mg/dL   Total CHOL/HDL Ratio 3.6 <5.0 Ratio   VLDL 21 <30 mg/dL   LDL Cholesterol 108 (H) <100 mg/dL  TSH  Result Value Ref Range   TSH 1.01 mIU/L  T4, free  Result Value Ref Range   Free T4 1.7 0.8 - 1.8 ng/dL  Thyroglobulin antibody  Result Value Ref Range   Thyroglobulin Ab 3 (H) <2 IU/mL  Thyroglobulin Level  Result Value Ref Range   Thyroglobulin <0.1 (L) ng/mL      Assessment  & Plan:   Problem List Items Addressed This Visit      Cardiovascular and Mediastinum   Essential hypertension Controlled hypertension.  BP goal < 130/80.  Pt is working on lifestyle modifications.  Taking medications tolerating well without side effects. Complicated by Addison's disease.  Plan: 1. Continue taking meds without change 2. Obtain labs next visit - regular checks with nephrology and upcoming visit. 3. Encouraged heart healthy diet and increasing exercise to 30 minutes most days of the week. 4. Check BP 1-2 x per week at home, keep log, and bring to clinic at next appointment. 5. Follow up 6 months.     Relevant Medications   nebivolol (BYSTOLIC) 5 MG tablet   NIFEdipine (NIFEDICAL XL) 60 MG 24 hr tablet     Endocrine   Postsurgical hypothyroidism Stable levothyroxine dose.  Continue current levothyroxine. Recheck labs.  Follow-up 6 months.   Relevant Medications   nebivolol (BYSTOLIC) 5 MG tablet   Other Relevant Orders   T3, free    Other Visit Diagnoses    Acquired hypothyroidism    -  Primary   Relevant Medications   nebivolol (BYSTOLIC) 5 MG tablet   Lichen sclerosus     Stable with improvement using clobetasol.  Stop to allow steroid rest and use vaginal moisturizer.  Can use topical estrogen if needed for recurrence.  FOLLOW-UP prn.      Meds ordered this encounter  Medications  . nebivolol (BYSTOLIC) 5 MG tablet    Sig: Take 1 tablet (5 mg total) by mouth daily.    Dispense:  90 tablet    Refill:  1  . NIFEdipine (NIFEDICAL XL) 60 MG 24 hr tablet    Sig: TAKE 1 TABLET EVERY EVENING    Dispense:  90 tablet    Refill:  2    Follow up plan: Return in about 6 months (around 06/15/2018) for hypertension, vaginitis.  Cassell Smiles, DNP, AGPCNP-BC Adult Gerontology Primary Care Nurse Practitioner [facility] 12/15/2017, 11:06 AM

## 2017-12-16 ENCOUNTER — Ambulatory Visit
Admission: RE | Admit: 2017-12-16 | Discharge: 2017-12-16 | Disposition: A | Discharge status: Home / Self Care | Payer: Medicare PPO | Source: Ambulatory Visit | Attending: Rheumatology | Admitting: Rheumatology

## 2017-12-16 DIAGNOSIS — G8929 Other chronic pain: Secondary | ICD-10-CM | POA: Diagnosis not present

## 2017-12-16 DIAGNOSIS — M545 Low back pain, unspecified: Secondary | ICD-10-CM

## 2017-12-16 DIAGNOSIS — M47897 Other spondylosis, lumbosacral region: Secondary | ICD-10-CM | POA: Insufficient documentation

## 2017-12-16 DIAGNOSIS — M5126 Other intervertebral disc displacement, lumbar region: Secondary | ICD-10-CM | POA: Insufficient documentation

## 2017-12-21 NOTE — Progress Notes (Signed)
Received fax from [place] that patient received Flu shot.

## 2017-12-25 DIAGNOSIS — M25511 Pain in right shoulder: Secondary | ICD-10-CM | POA: Diagnosis not present

## 2017-12-25 DIAGNOSIS — G8929 Other chronic pain: Secondary | ICD-10-CM | POA: Diagnosis not present

## 2017-12-25 DIAGNOSIS — M19011 Primary osteoarthritis, right shoulder: Secondary | ICD-10-CM | POA: Diagnosis not present

## 2017-12-25 DIAGNOSIS — M7541 Impingement syndrome of right shoulder: Secondary | ICD-10-CM | POA: Diagnosis not present

## 2017-12-29 ENCOUNTER — Ambulatory Visit: Payer: Medicare PPO

## 2018-01-12 ENCOUNTER — Ambulatory Visit: Payer: Medicare PPO | Admitting: Physical Therapy

## 2018-01-13 ENCOUNTER — Encounter: Payer: Self-pay | Admitting: Physical Therapy

## 2018-01-13 ENCOUNTER — Ambulatory Visit: Payer: Medicare PPO | Attending: Rheumatology

## 2018-01-13 ENCOUNTER — Other Ambulatory Visit: Payer: Self-pay

## 2018-01-13 DIAGNOSIS — M62838 Other muscle spasm: Secondary | ICD-10-CM | POA: Diagnosis not present

## 2018-01-13 DIAGNOSIS — M5442 Lumbago with sciatica, left side: Secondary | ICD-10-CM | POA: Diagnosis not present

## 2018-01-13 DIAGNOSIS — M6281 Muscle weakness (generalized): Secondary | ICD-10-CM | POA: Diagnosis not present

## 2018-01-13 DIAGNOSIS — G8929 Other chronic pain: Secondary | ICD-10-CM | POA: Diagnosis not present

## 2018-01-13 DIAGNOSIS — M25511 Pain in right shoulder: Secondary | ICD-10-CM | POA: Diagnosis not present

## 2018-01-13 DIAGNOSIS — M5441 Lumbago with sciatica, right side: Secondary | ICD-10-CM | POA: Diagnosis not present

## 2018-01-13 NOTE — Therapy (Signed)
[facility] [address] [place], [address] Phone: [phone]   Fax:  [phone]  Physical Therapy Evaluation  Patient Details  Name: Margaret Black MRN: [id number] Date of Birth: Jun 16, 1940 Referring Provider (PT): Julieanne Manson Leeanne Mannan   Encounter Date: 01/13/2018  PT End of Session - 01/13/18 1636    Visit Number  1    Number of Visits  16    Date for PT Re-Evaluation  03/10/18    Authorization Type  medicare 1/10    PT Start Time  1423    PT Stop Time  1530    PT Time Calculation (min)  67 min    Activity Tolerance  Patient tolerated treatment well    Behavior During Therapy  [facility] for tasks assessed/performed       Past Medical History:  Diagnosis Date  . Anxiety    Related to HTN and vision loss  . Central retinal vein occlusion of left eye    legally blind in Left eye  . 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 Collagen vascular disease ([place])    uncharacterized.  has seen rheum.  positive RF, positive serologies for Sjogrens.  C-ANCA/P-ANCA neg, ANA  neg, anti-dsDNA neg, anti-SCL70 neg, anti-centromere Ab neg  . COPD (chronic obstructive pulmonary disease) ([place])    quit smoking 1993  . Diverticulosis   . Dysfunction of eustachian tube   . Fatigue   . GERD (gastroesophageal reflux disease)   . Goiter, unspecified   . HLD (hyperlipidemia)   . HTN (hypertension)    difficult to control, hypertensive urgency hospitalizations, normal urine/plasma metanephrines and catecholamines, renal dopplers without stenosis  . Hyperaldosteronism    serum aldo/renin ration elevated (aldo 23.7, renin <0.15), CT abd 2010 with normal adrenals, dedicated adrenal CT without adrenal adenoma,   . Hypothyroid   . Insomnia    Related to HTN and vision loss  . Irritable bowel syndrome   . Leiomyosarcoma of uterus ([place]) 09/22/2014  . Lung cancer ([place])    s/p L lobectomy 2001   . Lupus ([place])   . Osteopenia   . Pulmonary hypertension ([place])    echo [facility] 12/2008 with EF >5%, grade II diastolic dysfunction, RSVP >63mmHg, ACE level normal.  f/u echo at [facility] - EF 55-60%, mild MR, mild LAE, PASP 25 + RA (no pulm HTN)  . Urge incontinence   . Uterine cancer [facility])    s/p hysterectomy 2001    Past Surgical History:  Procedure Laterality Date  . ABDOMINAL HYSTERECTOMY  06/1999  . APPENDECTOMY  1958  . CHOLECYSTECTOMY  1982  . CT ABD W & PELVIS WO CM  2010   neg except diverticulosis  . CT chest  2007   enlarged thyroid with goiter  . LOBECTOMY  04/1999   right lung  . THYROIDECTOMY    . TUBAL LIGATION  1975    There were no vitals filed for this visit.   Subjective Assessment - 01/13/18 1442    Subjective  Patient reports her main complaint is her R shoulder. Currently has some posterior hip and midline low back pain.    Pertinent History   Patient is 77 yo female that complains of acute on chronic posterior hip/LBP. Stated that most of her pain is in her posterior hips, but recently has progressed to include midline of low back around lumbar spine. Patient's main complaint is R  shoulder pain and feels that her shoulder is her most limiting factor, PT has informed pt to obtain an order from her physician to include the shoulder moving forward. PMH significant and involved, including hx of thyroid, uterine, and lung cancer, COPD, lupus, IBS, and chronic kidney disease, L eye blindness.    Limitations  Sitting;Standing;Walking    How long can you sit comfortably?  less than 2 hrs    How long can you stand comfortably?  NA    How long can you walk comfortably?  NA    Diagnostic tests  MRI: Mildly progressive disc bulging at L4-5 and mildly progressive facet arthrosis at L5-S1    Patient Stated Goals  main complaint is pain with sitting, new back pain with repositioning in bed    Currently in Pain?  Yes    Pain Score  4     Pain Location  Back    Pain  Orientation  Right;Left;Mid    Pain Descriptors / Indicators  Dull;Aching    Pain Type  Chronic pain    Pain Onset  More than a month ago    Pain Frequency  Intermittent    Aggravating Factors   sitting, prolonged standing     Pain Relieving Factors  medications    Effect of Pain on Daily Activities  impedes daily activities         01/13/18 0001  Assessment  Medical Diagnosis chronic low back pain without sciatica  Referring Provider (PT) Julieanne Manson Leeanne Mannan  Onset Date/Surgical Date 11/02/17  Hand Dominance Right  Next MD Visit    Prior Therapy yes  Precautions  Precautions None  Restrictions  Weight Bearing Restrictions No  Balance Screen  Has the patient fallen in the past 6 months No  Has the patient had a decrease in activity level because of a fear of falling?  Yes (due to pain)  Is the patient reluctant to leave their home because of a fear of falling?  No  Prior Function  Level of Independence Independent  Cognition  Overall Cognitive Status Within Functional Limits for tasks assessed  Sensation  Light Touch Appears Intact  ROM / Strength  AROM / PROM / Strength AROM;Strength;PROM  AROM  AROM Assessment Site Hip;Lumbar  Lumbar Flexion 100%, painful peripheralizes pain  Lumbar Extension 60% centralizes pain  Lumbar - Right Side Bend 100%  Lumbar - Left Side Bend 100%  Lumbar - Right Rotation 100%  Lumbar - Left Rotation 100%, painful on R  PROM  PROM Assessment Site Hip  Right/Left Hip Left  Left Hip Internal Rotation   (painful)  Strength  Strength Assessment Site Knee;Hip;Ankle  Right/Left Hip Right;Left  Right/Left Knee Right;Left  Right/Left Ankle  (WFLs)  Right Hip Flexion 4/5  Right Hip Extension 4-/5  Right Hip External Rotation  4-/5  Right Hip Internal Rotation 4-/5  Right Hip ABduction 3+/5  Right Hip ADduction 3+/5  Left Hip Flexion 4+/5  Left Hip Extension 4-/5  Left Hip External Rotation 4-/5  Left Hip Internal Rotation 4-/5   Left Hip ABduction 3+/5  Left Hip ADduction 3/5  Right Knee Flexion 4+/5  Right Knee Extension 4+/5  Left Knee Flexion 4+/5  Left Knee Extension 4+/5  Flexibility  Soft Tissue Assessment /Muscle Length y  Hamstrings normal  Piriformis mod BLE  Palpation  Spinal mobility hypomobility L5-T6  Palpation comment TTP lumbar paraspinals, glute med/max, piriformis BLE  Special Tests  Other special tests -FADDIR, SLUMP, SLR on LLE. +  slump on RLE. -FADDIR on R, +FABER on L     Objective measurements completed on examination: See above findings.   Treatment:  Therapeutic Exercise: Patient performed with verbal and tactile cues from PT with good carry over with repetition: goal: increased soft tissue length, improve mobility, centralize symptoms.    Supine Piriformis Stretch with Foot on Ground - 3 reps  30 hold  Supine Lower Trunk Rotation - 10 reps 2 sets  Standing Lumbar Extension with Counter - 10 reps  Scapular Retraction with Resistance x10 Shoulder External Rotation and Scapular Retraction with Resistance x10  PT and patient spent extensive time reviewing POC, condition, expectations of therapy, role of therapy.      PT Education - 01/13/18 1635    Education Details  therex technique/form, POC, condition    Person(s) Educated  Patient    Methods  Explanation;Demonstration;Tactile cues;Handout    Comprehension  Verbalized understanding;Tactile cues required;Returned demonstration;Verbal cues required       PT Short Term Goals - 01/13/18 1717      PT SHORT TERM GOAL #1   Title  Patient will be adherent to initial HEP at least 3x a week to improve functional strength and balance for better safety at home.    Baseline  administered today    Time  4    Period  Weeks    Status  New    Target Date  02/10/18        PT Long Term Goals - 01/13/18 1717      PT LONG TERM GOAL #1   Title  Patient will increase BLE gross strength to 4+/5 as to improve functional strength  for independent gait, increased standing tolerance and increased ADL ability.    Baseline  see current objective flowsheet for MMT grade    Time  8    Period  Weeks    Status  New    Target Date  03/10/18      PT LONG TERM GOAL #2   Title  Patient will tolerate sitting unsupported demonstrating erect sitting posture with minimal thoracic kyphosis for 15+ minutes with maximum of 3/10 back pain to demonstrate improved back extensor strength and improved sitting tolerance.     Baseline  poor tolerance to sitting, dependent on the day, poor posture    Time  8    Period  Weeks    Status  New    Target Date  03/10/18      PT LONG TERM GOAL #3   Title  Patient will demonstrate at least 10 point improvement in FOTO score indicating improvement in ability to perform functional activities    Time  8    Period  Weeks    Status  New    Target Date  03/10/18             Plan - 01/13/18 1651    Clinical Impression Statement  Patient is 77 yo female that presented with acute on chronic LBP that has worsened in the last 2.5 months to include posterior hip pain and lumbar pain. Upon assessment patient demonstrated decreased LE strength, impaired soft tissue integrity, postural abnormalities, decreased activity tolerance and endurance as well as impaired mobility of the spine. Patient responded well to extension based exercises. The patient would benefit from further skilled PT to address current limitations to improve quality of life and ability to perform functional activities.     History and Personal Factors relevant to plan of care:  hx of thyroid, uterine, and lung cancer, COPD, lupus, IBS, and chronic kidney disease, HTN, L eye blindness, LATEX ALLERGY    Clinical Presentation  Evolving    Clinical Presentation due to:  Symptoms in back are worsening with time    Clinical Decision Making  Moderate    Rehab Potential  Good    Clinical Impairments Affecting Rehab Potential  + motivation,  symptom duration -, comorbidities    PT Frequency  2x / week    PT Duration  8 weeks    PT Treatment/Interventions  Electrical Stimulation;Therapeutic activities;Joint Manipulations;Spinal Manipulations;Taping;Vestibular;Passive range of motion;Dry needling;Patient/family education;Manual techniques;Therapeutic exercise;Balance training;Neuromuscular re-education;Stair training;Functional mobility training;Cryotherapy;Canalith Repostioning;Moist Heat;Gait training    PT Next Visit Plan  assess shoulder? progress back exercises, assess response to extension based exercises    PT Home Exercise Plan  see patient instruction section: piriformis stretch, LTR, prone lying, standing extension, scapular retraction, ER with band Access Code: [email or web address]     Consulted and Agree with Plan of Care  Patient       Patient will benefit from skilled therapeutic intervention in order to improve the following deficits and impairments:  Decreased endurance, Decreased mobility, Difficulty walking, Increased muscle spasms, Improper body mechanics, Decreased range of motion, Cardiopulmonary status limiting activity, Decreased activity tolerance, Decreased strength, Pain, Postural dysfunction  Visit Diagnosis: Chronic bilateral low back pain with bilateral sciatica  Muscle weakness (generalized)  Other muscle spasm     Problem List Patient Active Problem List   Diagnosis Date Noted  . Anxiety 06/12/2017  . Rheumatoid factor positive 02/26/2017  . Brachial radiculitis 12/25/2016  . Closed traumatic dislocation of cervical vertebra 12/25/2016  . Weakness of limb 12/25/2016  . Insomnia 02/08/2015  . Carotid stenosis 02/08/2015  . Solitary pulmonary nodule 03/31/2014  . Systemic lupus ([place]) 12/15/2013  . Near syncope 05/03/2013  . Vertigo 08/09/2012  . History of lung cancer 07/14/2012  . Interstitial nephritis, acute 07/14/2012  . History of thyroid cancer 06/30/2012  . Livedo reticularis 06/11/2012  .  Antiphospholipid antibody positive 06/11/2012  . CRVO (central retinal vein occlusion) 05/25/2012  . Rash 04/06/2012  . Syncope 09/29/2011  . Stage III chronic kidney disease ([place]) 10/09/2010  . HYPERALDOSTERONISM UNSPECIFIED 01/12/2009  . HYPERPLASIA, ADRENAL 01/12/2009  . HYPERTENSION, PULMONARY 01/04/2009  . COPD (chronic obstructive pulmonary disease) ([place]) 01/04/2009  . Blindness of left eye 12/12/2008  . IBS 07/12/2007  . Hypercholesterolemia 04/27/2007  . INCONTINENCE, URGE 04/23/2007  . Postsurgical hypothyroidism 06/22/2006  . Essential hypertension 06/22/2006  . GERD 06/22/2006  . Osteopenia 06/22/2006  . UTERINE CANCER, HX OF 06/22/2006  . DIVERTICULITIS, HX OF 06/22/2006    Lieutenant Diego PT, DPT 8:17 AM,01/14/18 [facility] [address] [place], [address] Phone: [phone]   Fax:  [phone]  Name: TYANNE DEROCHER MRN: [id number] Date of Birth: [birth year]

## 2018-01-13 NOTE — Patient Instructions (Signed)
Access Code: [email or web address]  URL: [email or web address]  Date: 01/13/2018  Prepared by: Lieutenant Diego   Exercises  Supine Piriformis Stretch with Foot on Ground - 3 reps - 1 sets - 30 hold - 3x daily - 7x weekly  Supine Lower Trunk Rotation - 10 reps - 2 sets - 2x daily - 7x weekly  Standing Lumbar Extension with Counter - 10 reps - 1 sets - 3x daily - 7x weekly  Lying Prone - 26mins hold - 1x daily - 7x weekly  Scapular Retraction with Resistance - 10 reps - 2 sets - 1x daily - 7x weekly  Shoulder External Rotation and Scapular Retraction with Resistance - 10 reps - 2 sets - 1x daily - 7x weekly

## 2018-01-14 ENCOUNTER — Ambulatory Visit: Payer: Medicare PPO | Admitting: Physical Therapy

## 2018-01-15 ENCOUNTER — Ambulatory Visit: Payer: Medicare PPO

## 2018-01-15 ENCOUNTER — Encounter: Payer: Self-pay | Admitting: Physical Therapy

## 2018-01-15 DIAGNOSIS — M62838 Other muscle spasm: Secondary | ICD-10-CM | POA: Diagnosis not present

## 2018-01-15 DIAGNOSIS — M6281 Muscle weakness (generalized): Secondary | ICD-10-CM

## 2018-01-15 DIAGNOSIS — G8929 Other chronic pain: Secondary | ICD-10-CM

## 2018-01-15 DIAGNOSIS — M25511 Pain in right shoulder: Secondary | ICD-10-CM | POA: Diagnosis not present

## 2018-01-15 DIAGNOSIS — M5441 Lumbago with sciatica, right side: Secondary | ICD-10-CM | POA: Diagnosis not present

## 2018-01-15 DIAGNOSIS — M5442 Lumbago with sciatica, left side: Secondary | ICD-10-CM | POA: Diagnosis not present

## 2018-01-15 NOTE — Therapy (Signed)
[facility] [address], [place], [address] Phone: [phone]   Fax:  [phone]  Physical Therapy Treatment  Patient Details  Name: Margaret Black MRN: [id number] Date of Birth: 13-Nov-1940 Referring Provider (PT): Julieanne Manson Leeanne Mannan   Encounter Date: 01/15/2018  PT End of Session - 01/15/18 0913    Visit Number  2    Number of Visits  16    Date for PT Re-Evaluation  03/10/18    Authorization Type  medicare 2/10    PT Start Time  0758    PT Stop Time  0902    PT Time Calculation (min)  64 min    Activity Tolerance  Patient tolerated treatment well       Past Medical History:  Diagnosis Date  . Anxiety    Related to HTN and vision loss  . Central retinal vein occlusion of left eye    legally blind in Left eye  . 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 Collagen vascular disease ([place])    uncharacterized.  has seen rheum.  positive RF, positive serologies for Sjogrens.  C-ANCA/P-ANCA neg, ANA  neg, anti-dsDNA neg, anti-SCL70 neg, anti-centromere Ab neg  . COPD (chronic obstructive pulmonary disease) ([place])    quit smoking 1993  . Diverticulosis   . Dysfunction of eustachian tube   . Fatigue   . GERD (gastroesophageal reflux disease)   . Goiter, unspecified   . HLD (hyperlipidemia)   . HTN (hypertension)    difficult to control, hypertensive urgency hospitalizations, normal urine/plasma metanephrines and catecholamines, renal dopplers without stenosis  . Hyperaldosteronism    serum aldo/renin ration elevated (aldo 23.7, renin <0.15), CT abd 2010 with normal adrenals, dedicated adrenal CT without adrenal adenoma,   . Hypothyroid   . Insomnia    Related to HTN and vision loss  . Irritable bowel syndrome   . Leiomyosarcoma of uterus ([facility]) 09/22/2014  . Lung cancer ([place])    s/p L lobectomy 2001  . Lupus ([place])   . Osteopenia   . Pulmonary hypertension ([place])     echo [facility] 12/2008 with EF >5%, grade II diastolic dysfunction, RSVP >52mmHg, ACE level normal.  f/u echo at [facility] - EF 55-60%, mild MR, mild LAE, PASP 25 + RA (no pulm HTN)  . Urge incontinence   . Uterine cancer [facility])    s/p hysterectomy 2001    Past Surgical History:  Procedure Laterality Date  . ABDOMINAL HYSTERECTOMY  06/1999  . APPENDECTOMY  1958  . CHOLECYSTECTOMY  1982  . CT ABD W & PELVIS WO CM  2010   neg except diverticulosis  . CT chest  2007   enlarged thyroid with goiter  . LOBECTOMY  04/1999   right lung  . THYROIDECTOMY    . TUBAL LIGATION  1975    There were no vitals filed for this visit.  Subjective Assessment - 01/15/18 0904    Subjective  PT recieved order for RUE. Patient reports that today is not too bad of a day for her R shoulder, has some questions about her HEP.    Pertinent History   Patient is 77 yo female that complains of acute on chronic posterior hip/LBP. Stated that most of her pain is in her posterior hips, but recently has progressed to include midline of low back around lumbar spine. Patient's main complaint is R shoulder pain and feels that  her shoulder is her most limiting factor, PT has informed pt to obtain an order from her physician to include the shoulder moving forward. PMH significant and involved, including hx of thyroid, uterine, and lung cancer, COPD, lupus, IBS, and chronic kidney disease, L eye blindness. Patient complains of RUE pain that started several years ago after pushing/pulling heavy furniture, has had several shoulder injections in the past. Functional activities such as pushing/pulling/lifting/reaching aggravate her pain, rest relieves.     Limitations  Sitting;Walking;House hold activities;Lifting    Currently in Pain?  Yes    Pain Score  4     Pain Location  Back    Pain Orientation  Right;Lower    Pain Descriptors / Indicators  Dull;Aching;Sore    Pain Type  Chronic pain    Pain Onset  More than a month ago     Aggravating Factors   sitting, prolonged, standing, reaching/lifting/pushing/pulling    Pain Relieving Factors  medications    Effect of Pain on Daily Activities  impedes    Multiple Pain Sites  Yes    Pain Score  4    Pain Location  Shoulder    Pain Orientation  Right    Pain Descriptors / Indicators  Aching;Sore    Pain Type  Chronic pain    Pain Onset  More than a month ago    Pain Frequency  Intermittent    Aggravating Factors   lifting/reaching/pulling/pushing    Pain Relieving Factors  rest    Effect of Pain on Daily Activities  impedes daily activities        [facility] PT Assessment - 01/15/18 0001      Posture/Postural Control   Posture Comments  fwd head/rounded shoulders      ROM / Strength   AROM / PROM / Strength  AROM;Strength      AROM   AROM Assessment Site  Cervical;Shoulder    Cervical Flexion  100%    Cervical Extension  100%    Cervical - Right Side Bend  100%    Cervical - Left Side Bend  100%   no stretch pain   Cervical - Right Rotation  100%    Cervical - Left Rotation  100%      Strength   Strength Assessment Site  Shoulder;Cervical    Right/Left Shoulder  Right;Left    Right Shoulder Flexion  3+/5    Right Shoulder Extension  4/5    Right Shoulder ABduction  4+/5    Right Shoulder Internal Rotation  4/5    Right Shoulder External Rotation  4/5    Left Shoulder Flexion  4+/5    Left Shoulder Extension  4+/5    Left Shoulder ABduction  4+/5    Left Shoulder Internal Rotation  4+/5    Left Shoulder External Rotation  4+/5    Cervical Flexion  5/5    Cervical Extension  5/5    Cervical - Right Side Bend  5/5    Cervical - Left Side Bend  5/5    Cervical - Right Rotation  5/5    Cervical - Left Rotation  5/5      Palpation   Palpation comment  TTP bicep long head, bicipital groove, bicep belly, infraspinatus, coracoid process,  ant deltoid      Special Tests   Other special tests  R: -ER lag, drop horn, resisted ER, -painful arc, -AC joint  palpation, +hawkins kennedy        Treatment:  Access Code: [email or web address]  Therapeutic exercises:  Supine Piriformis Stretch with Foot on Ground - 3 reps - 1 sets - 30  Supine Lower Trunk Rotation - 10 reps - 2 sets - 2x daily - 7x weekly  Bicep Stretch at Table, bicep stretch on wall, patient unable to maintain proper positioning/reported did not feel stretch, re attempt next session Seated Isometric Elbow Flexion - 10 reps - 1 sets - 3 hold  Doorway Pec Stretch at 60 Elevation - 3 reps - 1 sets - 30 hold Supine over half foam roll shoulders stretch 3x30s holds  Kinesiotape applied to b/l piriformis, KT use, application, signs/symptoms indicating removal and skin test. Patient expressed understanding. No skin irritation noted after 5 minutes.  Manual therapy: to address impaired soft tissue integrity, increased tissue length, decrease pain STM and intermittent trigger pointing to R bicep, pec major, bicep belly, b/l posterior hip (glute med/piriformis)  Patient response to treatment: Patient reported improved lumbar/hip pain at end of session, no increase in RUE pain or symptoms.    PT Education - 01/15/18 0908    Education Details  therex technique/form, POC, condition    Person(s) Educated  Patient    Methods  Explanation;Demonstration;Tactile cues;Handout    Comprehension  Verbalized understanding;Returned demonstration;Tactile cues required       PT Short Term Goals - 01/13/18 1717      PT SHORT TERM GOAL #1   Title  Patient will be adherent to initial HEP at least 3x a week to improve functional strength and balance for better safety at home.    Baseline  administered today    Time  4    Period  Weeks    Status  New    Target Date  02/10/18        PT Long Term Goals - 01/15/18 0908      PT LONG TERM GOAL #1   Title  Patient will increase BLE gross strength to 4+/5 as to improve functional strength for independent gait, increased standing tolerance and increased ADL  ability.    Baseline  see current objective flowsheet for MMT grade    Time  8    Period  Weeks    Status  New    Target Date  03/10/18      PT LONG TERM GOAL #2   Title  Patient will tolerate sitting unsupported demonstrating erect sitting posture with minimal thoracic kyphosis for 15+ minutes with maximum of 3/10 back pain to demonstrate improved back extensor strength and improved sitting tolerance.     Baseline  poor tolerance to sitting, dependent on the day, poor posture    Time  8    Period  Weeks    Status  New    Target Date  03/10/18      PT LONG TERM GOAL #3   Title  Patient will demonstrate at least 10 point improvement in FOTO score indicating improvement in ability to perform functional activities    Baseline  01/13/2018: 54    Period  Weeks    Status  New    Target Date  03/10/18      PT LONG TERM GOAL #4   Title  Patient will demonstrate bilateral UE MMT grade of at least 4+/5 to improve ability to perform functional activities    Time  8    Period  Weeks    Status  New    Target Date  03/10/18      PT LONG  TERM GOAL #5   Title  Patient will report RUE pain of 3/10 or less with functional activities such as lifting, reaching, dressing, etc to demonstrate improved tolerance to activities.    Time  8    Period  Weeks    Status  New    Target Date  03/10/18            Plan - 01/15/18 6237    Clinical Impression Statement  Patient demonstrated decreased RUE strength, activity tolerance, ROM and postural abnormalities. Patient also has impaired soft tissue integrity and trigger points in R bicep, pec major/minor, and scpaular weakness. The patient would benefit from further skilled PT to address deficits in lumbar and RUE to improve ease with functional activities.    Clinical Impairments Affecting Rehab Potential  + motivation, symptom duration -, comorbidities    PT Frequency  2x / week    PT Duration  8 weeks    PT Treatment/Interventions  Electrical  Stimulation;Therapeutic activities;Joint Manipulations;Spinal Manipulations;Taping;Vestibular;Passive range of motion;Dry needling;Patient/family education;Manual techniques;Therapeutic exercise;Balance training;Neuromuscular re-education;Stair training;Functional mobility training;Cryotherapy;Canalith Repostioning;Moist Heat;Gait training    PT Next Visit Plan  progress back exercsies, STM to hips and R bicep, add scapular strengthening    PT Home Exercise Plan  see patient instruction section: piriformis stretch, LTR, prone lying, standing extension, scapular retraction, ER with band Access Code: [email or web address]     Consulted and Agree with Plan of Care  Patient       Patient will benefit from skilled therapeutic intervention in order to improve the following deficits and impairments:  Decreased endurance, Decreased mobility, Difficulty walking, Increased muscle spasms, Improper body mechanics, Decreased range of motion, Cardiopulmonary status limiting activity, Decreased activity tolerance, Decreased strength, Pain, Postural dysfunction  Visit Diagnosis: Chronic bilateral low back pain with bilateral sciatica  Muscle weakness (generalized)  Other muscle spasm  Chronic right shoulder pain     Problem List Patient Active Problem List   Diagnosis Date Noted  . Anxiety 06/12/2017  . Rheumatoid factor positive 02/26/2017  . Brachial radiculitis 12/25/2016  . Closed traumatic dislocation of cervical vertebra 12/25/2016  . Weakness of limb 12/25/2016  . Insomnia 02/08/2015  . Carotid stenosis 02/08/2015  . Solitary pulmonary nodule 03/31/2014  . Systemic lupus ([place]) 12/15/2013  . Near syncope 05/03/2013  . Vertigo 08/09/2012  . History of lung cancer 07/14/2012  . Interstitial nephritis, acute 07/14/2012  . History of thyroid cancer 06/30/2012  . Livedo reticularis 06/11/2012  . Antiphospholipid antibody positive 06/11/2012  . CRVO (central retinal vein occlusion) 05/25/2012  . Rash  04/06/2012  . Syncope 09/29/2011  . Stage III chronic kidney disease ([place]) 10/09/2010  . HYPERALDOSTERONISM UNSPECIFIED 01/12/2009  . HYPERPLASIA, ADRENAL 01/12/2009  . HYPERTENSION, PULMONARY 01/04/2009  . COPD (chronic obstructive pulmonary disease) ([place]) 01/04/2009  . Blindness of left eye 12/12/2008  . IBS 07/12/2007  . Hypercholesterolemia 04/27/2007  . INCONTINENCE, URGE 04/23/2007  . Postsurgical hypothyroidism 06/22/2006  . Essential hypertension 06/22/2006  . GERD 06/22/2006  . Osteopenia 06/22/2006  . UTERINE CANCER, HX OF 06/22/2006  . DIVERTICULITIS, HX OF 06/22/2006    Lieutenant Diego PT, DPT 9:22 AM,01/15/18 [phone]  [facility] [address], [place], [address] Phone: [phone]   Fax:  [phone]  Name: Margaret Black MRN: [id number] Date of Birth: [birth year]

## 2018-01-18 ENCOUNTER — Encounter: Payer: Self-pay | Admitting: Physical Therapy

## 2018-01-18 ENCOUNTER — Ambulatory Visit: Payer: Medicare PPO | Admitting: Physical Therapy

## 2018-01-18 ENCOUNTER — Ambulatory Visit: Payer: Medicare PPO

## 2018-01-18 DIAGNOSIS — M5441 Lumbago with sciatica, right side: Secondary | ICD-10-CM | POA: Diagnosis not present

## 2018-01-18 DIAGNOSIS — M25511 Pain in right shoulder: Secondary | ICD-10-CM

## 2018-01-18 DIAGNOSIS — G8929 Other chronic pain: Secondary | ICD-10-CM | POA: Diagnosis not present

## 2018-01-18 DIAGNOSIS — M5442 Lumbago with sciatica, left side: Principal | ICD-10-CM

## 2018-01-18 DIAGNOSIS — M6281 Muscle weakness (generalized): Secondary | ICD-10-CM | POA: Diagnosis not present

## 2018-01-18 DIAGNOSIS — M62838 Other muscle spasm: Secondary | ICD-10-CM | POA: Diagnosis not present

## 2018-01-18 NOTE — Therapy (Signed)
[place] [facility] [address], [place], [address] Phone: [phone]   Fax:  [phone]  Physical Therapy Treatment  Patient Details  Name: Margaret Black MRN: [id number] Date of Birth: 16-Sep-1940 Referring Provider (PT): Julieanne Manson Leeanne Mannan   Encounter Date: 01/18/2018  PT End of Session - 01/18/18 1558    Visit Number  3    Number of Visits  16    Date for PT Re-Evaluation  03/10/18    Authorization Type  medicare 3/10    PT Start Time  1601    PT Stop Time  1647    PT Time Calculation (min)  46 min    Activity Tolerance  Patient tolerated treatment well    Behavior During Therapy  [facility] for tasks assessed/performed       Past Medical History:  Diagnosis Date  . Anxiety    Related to HTN and vision loss  . Central retinal vein occlusion of left eye    legally blind in Left eye  . 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 Collagen vascular disease ([place])    uncharacterized.  has seen rheum.  positive RF, positive serologies for Sjogrens.  C-ANCA/P-ANCA neg, ANA  neg, anti-dsDNA neg, anti-SCL70 neg, anti-centromere Ab neg  . COPD (chronic obstructive pulmonary disease) ([place])    quit smoking 1993  . Diverticulosis   . Dysfunction of eustachian tube   . Fatigue   . GERD (gastroesophageal reflux disease)   . Goiter, unspecified   . HLD (hyperlipidemia)   . HTN (hypertension)    difficult to control, hypertensive urgency hospitalizations, normal urine/plasma metanephrines and catecholamines, renal dopplers without stenosis  . Hyperaldosteronism    serum aldo/renin ration elevated (aldo 23.7, renin <0.15), CT abd 2010 with normal adrenals, dedicated adrenal CT without adrenal adenoma,   . Hypothyroid   . Insomnia    Related to HTN and vision loss  . Irritable bowel syndrome   . Leiomyosarcoma of uterus ([place]) 09/22/2014  . Lung cancer ([place])    s/p L lobectomy 2001  .  Lupus ([place])   . Osteopenia   . Pulmonary hypertension ([place])    echo [facility] 12/2008 with EF >5%, grade II diastolic dysfunction, RSVP >22mmHg, ACE level normal.  f/u echo at [facility] - EF 55-60%, mild MR, mild LAE, PASP 25 + RA (no pulm HTN)  . Urge incontinence   . Uterine cancer [facility])    s/p hysterectomy 2001    Past Surgical History:  Procedure Laterality Date  . ABDOMINAL HYSTERECTOMY  06/1999  . APPENDECTOMY  1958  . CHOLECYSTECTOMY  1982  . CT ABD W & PELVIS WO CM  2010   neg except diverticulosis  . CT chest  2007   enlarged thyroid with goiter  . LOBECTOMY  04/1999   right lung  . THYROIDECTOMY    . TUBAL LIGATION  1975    There were no vitals filed for this visit.  Subjective Assessment - 01/18/18 1629    Subjective  Patient reports that overall she feels she is improving, but continues to be sore after PT.     Currently in Pain?  Yes    Pain Score  4     Pain Location  Back    Pain Orientation  Right    Pain Descriptors / Indicators  Aching    Pain Onset  More than a month ago  Pain Score  0    Pain Location  Shoulder    Pain Orientation  Right       Therapeutic exercises:  Supine Piriformis Stretch with Foot on Ground - 3 reps - 1 sets - 30  Seated piriformis stretch 1x30s Unable to "feel" bicep stretches on RUE. Bridges x15  Doorway Pec Stretch at 60 Elevation - 3 reps - 1 sets - 30 hold Supine over half foam roll shoulders stretch 3x30s holds Scapular retraction with RTB 2x10 with tactile cues Scapular retraction advanced with RTB x15 with tactile/visual cues ER with RTB bilaterally with verbal/tactile cues (Shoulder extension 2x10 with RTB next visit)  Kinesiotape applied to b/l piriformis, explanation of skin irritation signs/symptoms indicating removal. Patient expressed understanding.   Manual therapy: to address impaired soft tissue integrity, increased tissue length, decrease pain STM and intermittent trigger pointing to R bicep, anterior  deltoid bicep belly, b/l posterior hip (glute med/piriformis)  Patient response to treatment: Patient reported some posterior hip soreness at end of session.    PT Education - 01/18/18 1558    Education Details  therex technique/form    Person(s) Educated  Patient    Methods  Explanation;Demonstration;Verbal cues;Tactile cues    Comprehension  Verbalized understanding;Returned demonstration;Verbal cues required;Tactile cues required       PT Short Term Goals - 01/13/18 1717      PT SHORT TERM GOAL #1   Title  Patient will be adherent to initial HEP at least 3x a week to improve functional strength and balance for better safety at home.    Baseline  administered today    Time  4    Period  Weeks    Status  New    Target Date  02/10/18        PT Long Term Goals - 01/15/18 0908      PT LONG TERM GOAL #1   Title  Patient will increase BLE gross strength to 4+/5 as to improve functional strength for independent gait, increased standing tolerance and increased ADL ability.    Baseline  see current objective flowsheet for MMT grade    Time  8    Period  Weeks    Status  New    Target Date  03/10/18      PT LONG TERM GOAL #2   Title  Patient will tolerate sitting unsupported demonstrating erect sitting posture with minimal thoracic kyphosis for 15+ minutes with maximum of 3/10 back pain to demonstrate improved back extensor strength and improved sitting tolerance.     Baseline  poor tolerance to sitting, dependent on the day, poor posture    Time  8    Period  Weeks    Status  New    Target Date  03/10/18      PT LONG TERM GOAL #3   Title  Patient will demonstrate at least 10 point improvement in FOTO score indicating improvement in ability to perform functional activities    Baseline  01/13/2018: 54    Period  Weeks    Status  New    Target Date  03/10/18      PT LONG TERM GOAL #4   Title  Patient will demonstrate bilateral UE MMT grade of at least 4+/5 to improve  ability to perform functional activities    Time  8    Period  Weeks    Status  New    Target Date  03/10/18      PT LONG  TERM GOAL #5   Title  Patient will report RUE pain of 3/10 or less with functional activities such as lifting, reaching, dressing, etc to demonstrate improved tolerance to activities.    Time  8    Period  Weeks    Status  New    Target Date  03/10/18            Plan - 01/18/18 1650    Clinical Impression Statement  Patient demonstrated improved soft tissue integrity (~25%) s/p STM, with improvement in sensitivity to palpation as well of both posterior hips and RUE. Patient continues to need verbal and tactile cues to maintain therapeutic exercise form to optimize movements.     PT Frequency  2x / week    PT Duration  8 weeks    PT Treatment/Interventions  Electrical Stimulation;Therapeutic activities;Joint Manipulations;Spinal Manipulations;Taping;Vestibular;Passive range of motion;Dry needling;Patient/family education;Manual techniques;Therapeutic exercise;Balance training;Neuromuscular re-education;Stair training;Functional mobility training;Cryotherapy;Canalith Repostioning;Moist Heat;Gait training    PT Next Visit Plan  progress back exercsies, STM to hips and R bicep, add scapular strengthening    PT Home Exercise Plan  see patient instruction section: piriformis stretch, LTR, prone lying, standing extension, scapular retraction, ER with band Access Code: [email or web address]     Consulted and Agree with Plan of Care  Patient       Patient will benefit from skilled therapeutic intervention in order to improve the following deficits and impairments:  Decreased endurance, Decreased mobility, Difficulty walking, Increased muscle spasms, Improper body mechanics, Decreased range of motion, Cardiopulmonary status limiting activity, Decreased activity tolerance, Decreased strength, Pain, Postural dysfunction  Visit Diagnosis: Chronic bilateral low back pain with bilateral  sciatica  Muscle weakness (generalized)  Other muscle spasm  Chronic right shoulder pain     Problem List Patient Active Problem List   Diagnosis Date Noted  . Anxiety 06/12/2017  . Rheumatoid factor positive 02/26/2017  . Brachial radiculitis 12/25/2016  . Closed traumatic dislocation of cervical vertebra 12/25/2016  . Weakness of limb 12/25/2016  . Insomnia 02/08/2015  . Carotid stenosis 02/08/2015  . Solitary pulmonary nodule 03/31/2014  . Systemic lupus ([place]) 12/15/2013  . Near syncope 05/03/2013  . Vertigo 08/09/2012  . History of lung cancer 07/14/2012  . Interstitial nephritis, acute 07/14/2012  . History of thyroid cancer 06/30/2012  . Livedo reticularis 06/11/2012  . Antiphospholipid antibody positive 06/11/2012  . CRVO (central retinal vein occlusion) 05/25/2012  . Rash 04/06/2012  . Syncope 09/29/2011  . Stage III chronic kidney disease ([place]) 10/09/2010  . HYPERALDOSTERONISM UNSPECIFIED 01/12/2009  . HYPERPLASIA, ADRENAL 01/12/2009  . HYPERTENSION, PULMONARY 01/04/2009  . COPD (chronic obstructive pulmonary disease) ([place]) 01/04/2009  . Blindness of left eye 12/12/2008  . IBS 07/12/2007  . Hypercholesterolemia 04/27/2007  . INCONTINENCE, URGE 04/23/2007  . Postsurgical hypothyroidism 06/22/2006  . Essential hypertension 06/22/2006  . GERD 06/22/2006  . Osteopenia 06/22/2006  . UTERINE CANCER, HX OF 06/22/2006  . DIVERTICULITIS, HX OF 06/22/2006    Lieutenant Diego PT, DPT 4:53 PM,01/18/18 [facility] [address], [place], [address] Phone: [phone]   Fax:  [phone]  Name: Margaret Black MRN: [id number] Date of Birth: Nov 11, 1940

## 2018-01-19 ENCOUNTER — Ambulatory Visit: Payer: Medicare PPO | Admitting: Physical Therapy

## 2018-01-20 ENCOUNTER — Ambulatory Visit: Payer: Medicare PPO

## 2018-01-20 ENCOUNTER — Encounter: Payer: Self-pay | Admitting: Physical Therapy

## 2018-01-20 DIAGNOSIS — M62838 Other muscle spasm: Secondary | ICD-10-CM | POA: Diagnosis not present

## 2018-01-20 DIAGNOSIS — M25511 Pain in right shoulder: Secondary | ICD-10-CM | POA: Diagnosis not present

## 2018-01-20 DIAGNOSIS — M6281 Muscle weakness (generalized): Secondary | ICD-10-CM

## 2018-01-20 DIAGNOSIS — G8929 Other chronic pain: Secondary | ICD-10-CM

## 2018-01-20 DIAGNOSIS — M5441 Lumbago with sciatica, right side: Secondary | ICD-10-CM | POA: Diagnosis not present

## 2018-01-20 DIAGNOSIS — M5442 Lumbago with sciatica, left side: Secondary | ICD-10-CM | POA: Diagnosis not present

## 2018-01-20 NOTE — Therapy (Signed)
[facility] [address], [place], [address] Phone: [phone]   Fax:  [phone]  Physical Therapy Treatment  Patient Details  Name: Margaret Black MRN: [id number] Date of Birth: 01/30/1941 Referring Provider (PT): Julieanne Manson Leeanne Mannan   Encounter Date: 01/20/2018  PT End of Session - 01/20/18 1554    Visit Number  4    Number of Visits  16    Date for PT Re-Evaluation  03/10/18    Authorization Type  medicare 4/10    PT Start Time  7893    PT Stop Time  1643    PT Time Calculation (min)  46 min    Activity Tolerance  Patient tolerated treatment well    Behavior During Therapy  [facility] for tasks assessed/performed       Past Medical History:  Diagnosis Date  . Anxiety    Related to HTN and vision loss  . Central retinal vein occlusion of left eye    legally blind in Left eye  . 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 Collagen vascular disease ([place])    uncharacterized.  has seen rheum.  positive RF, positive serologies for Sjogrens.  C-ANCA/P-ANCA neg, ANA  neg, anti-dsDNA neg, anti-SCL70 neg, anti-centromere Ab neg  . COPD (chronic obstructive pulmonary disease) ([place])    quit smoking 1993  . Diverticulosis   . Dysfunction of eustachian tube   . Fatigue   . GERD (gastroesophageal reflux disease)   . Goiter, unspecified   . HLD (hyperlipidemia)   . HTN (hypertension)    difficult to control, hypertensive urgency hospitalizations, normal urine/plasma metanephrines and catecholamines, renal dopplers without stenosis  . Hyperaldosteronism    serum aldo/renin ration elevated (aldo 23.7, renin <0.15), CT abd 2010 with normal adrenals, dedicated adrenal CT without adrenal adenoma,   . Hypothyroid   . Insomnia    Related to HTN and vision loss  . Irritable bowel syndrome   . Leiomyosarcoma of uterus ([place]) 09/22/2014  . Lung cancer ([place])    s/p L lobectomy 2001  .  Lupus ([place])   . Osteopenia   . Pulmonary hypertension ([place])    echo [facility] 12/2008 with EF >5%, grade II diastolic dysfunction, RSVP >68mmHg, ACE level normal.  f/u echo at [place] - EF 55-60%, mild MR, mild LAE, PASP 25 + RA (no pulm HTN)  . Urge incontinence   . Uterine cancer [facility])    s/p hysterectomy 2001    Past Surgical History:  Procedure Laterality Date  . ABDOMINAL HYSTERECTOMY  06/1999  . APPENDECTOMY  1958  . CHOLECYSTECTOMY  1982  . CT ABD W & PELVIS WO CM  2010   neg except diverticulosis  . CT chest  2007   enlarged thyroid with goiter  . LOBECTOMY  04/1999   right lung  . THYROIDECTOMY    . TUBAL LIGATION  1975    There were no vitals filed for this visit.  Subjective Assessment - 01/20/18 1557    Subjective  Patient reports that her back was really bothering her yesterday.    Pertinent History   Patient is 77 yo female that complains of acute on chronic posterior hip/LBP. Stated that most of her pain is in her posterior hips, but recently has progressed to include midline of low back around lumbar spine. Patient's main complaint is R shoulder pain and feels that her shoulder is her most  limiting factor, PT has informed pt to obtain an order from her physician to include the shoulder moving forward. PMH significant and involved, including hx of thyroid, uterine, and lung cancer, COPD, lupus, IBS, and chronic kidney disease, L eye blindness. Patient complains of RUE pain that started several years ago after pushing/pulling heavy furniture, has had several shoulder injections in the past. Functional activities such as pushing/pulling/lifting/reaching aggravate her pain, rest relieves.     Currently in Pain?  Yes    Pain Score  2     Pain Location  Back    Pain Orientation  Right;Left    Pain Descriptors / Indicators  Aching    Pain Type  Chronic pain    Pain Onset  More than a month ago    Pain Score  2    Pain Location  Shoulder    Pain Orientation  Right    Pain  Descriptors / Indicators  Aching;Sore    Pain Onset  More than a month ago       Therapeutic exercises: Standing lumbar extensions x15 Doorway Pec Stretch at 90 Elevation - 3 reps x30s holds, difficulty with performing form, multimodal cues especially tactile cues needed throughout session.  Scapular retraction with RTB 2x10 with tactile cues Scapular retraction advanced with RTB x20 with tactile/visual cues ER with RTB bilaterally with verbal/tactile cues tricep extension with RTB x20 b/l prone press ups from hands x10   Manual therapy: to address impaired soft tissue integrity, increased tissue length, decrease pain STM and intermittent trigger pointing to R bicep, anterior deltoid bicep belly, b/l posterior hip (glute med/piriformis) CPA to lumbar spine x41mins with improvement in symptoms post STM and CPAs    Patient response to treatment: Patient with improvement in hip symptoms at end of session, stated she was able to sit straight on for the first time in a long time.    PT Education - 01/20/18 1554    Education Details  therex technique/form    Person(s) Educated  Patient    Methods  Explanation;Demonstration;Verbal cues    Comprehension  Verbalized understanding;Returned demonstration;Verbal cues required       PT Short Term Goals - 01/13/18 1717      PT SHORT TERM GOAL #1   Title  Patient will be adherent to initial HEP at least 3x a week to improve functional strength and balance for better safety at home.    Baseline  administered today    Time  4    Period  Weeks    Status  New    Target Date  02/10/18        PT Long Term Goals - 01/15/18 0908      PT LONG TERM GOAL #1   Title  Patient will increase BLE gross strength to 4+/5 as to improve functional strength for independent gait, increased standing tolerance and increased ADL ability.    Baseline  see current objective flowsheet for MMT grade    Time  8    Period  Weeks    Status  New    Target Date   03/10/18      PT LONG TERM GOAL #2   Title  Patient will tolerate sitting unsupported demonstrating erect sitting posture with minimal thoracic kyphosis for 15+ minutes with maximum of 3/10 back pain to demonstrate improved back extensor strength and improved sitting tolerance.     Baseline  poor tolerance to sitting, dependent on the day, poor posture  Time  8    Period  Weeks    Status  New    Target Date  03/10/18      PT LONG TERM GOAL #3   Title  Patient will demonstrate at least 10 point improvement in FOTO score indicating improvement in ability to perform functional activities    Baseline  01/13/2018: 54    Period  Weeks    Status  New    Target Date  03/10/18      PT LONG TERM GOAL #4   Title  Patient will demonstrate bilateral UE MMT grade of at least 4+/5 to improve ability to perform functional activities    Time  8    Period  Weeks    Status  New    Target Date  03/10/18      PT LONG TERM GOAL #5   Title  Patient will report RUE pain of 3/10 or less with functional activities such as lifting, reaching, dressing, etc to demonstrate improved tolerance to activities.    Time  8    Period  Weeks    Status  New    Target Date  03/10/18            Plan - 01/20/18 1645    Clinical Impression Statement  Patient reported significant improvement in lumbar/hip pain s/p STM and extension based exercises, instructed and demonstrated how to perform those exercises at home. Patient with difficulty with coordinating exercises today, needed multimodal cues to perform properly. The patient would benefit from further skilled PT to continue to progress towards goals.    PT Frequency  2x / week    PT Duration  8 weeks    PT Treatment/Interventions  Electrical Stimulation;Therapeutic activities;Joint Manipulations;Spinal Manipulations;Taping;Vestibular;Passive range of motion;Dry needling;Patient/family education;Manual techniques;Therapeutic exercise;Balance  training;Neuromuscular re-education;Stair training;Functional mobility training;Cryotherapy;Canalith Repostioning;Moist Heat;Gait training    PT Next Visit Plan  progress back exercsies, STM to hips and R bicep, add scapular strengthening    PT Home Exercise Plan  see patient instruction section: piriformis stretch, LTR, prone lying, standing extension, scapular retraction, ER with band Access Code: [email or web address] add prone press ups and standing extensions    Consulted and Agree with Plan of Care  Patient       Patient will benefit from skilled therapeutic intervention in order to improve the following deficits and impairments:  Decreased endurance, Decreased mobility, Difficulty walking, Increased muscle spasms, Improper body mechanics, Decreased range of motion, Cardiopulmonary status limiting activity, Decreased activity tolerance, Decreased strength, Pain, Postural dysfunction  Visit Diagnosis: Chronic bilateral low back pain with bilateral sciatica  Muscle weakness (generalized)  Other muscle spasm  Chronic right shoulder pain     Problem List Patient Active Problem List   Diagnosis Date Noted  . Anxiety 06/12/2017  . Rheumatoid factor positive 02/26/2017  . Brachial radiculitis 12/25/2016  . Closed traumatic dislocation of cervical vertebra 12/25/2016  . Weakness of limb 12/25/2016  . Insomnia 02/08/2015  . Carotid stenosis 02/08/2015  . Solitary pulmonary nodule 03/31/2014  . Systemic lupus ([place]) 12/15/2013  . Near syncope 05/03/2013  . Vertigo 08/09/2012  . History of lung cancer 07/14/2012  . Interstitial nephritis, acute 07/14/2012  . History of thyroid cancer 06/30/2012  . Livedo reticularis 06/11/2012  . Antiphospholipid antibody positive 06/11/2012  . CRVO (central retinal vein occlusion) 05/25/2012  . Rash 04/06/2012  . Syncope 09/29/2011  . Stage III chronic kidney disease ([place]) 10/09/2010  . HYPERALDOSTERONISM UNSPECIFIED 01/12/2009  . HYPERPLASIA, ADRENAL  01/12/2009  .  HYPERTENSION, PULMONARY 01/04/2009  . COPD (chronic obstructive pulmonary disease) ([place]) 01/04/2009  . Blindness of left eye 12/12/2008  . IBS 07/12/2007  . Hypercholesterolemia 04/27/2007  . INCONTINENCE, URGE 04/23/2007  . Postsurgical hypothyroidism 06/22/2006  . Essential hypertension 06/22/2006  . GERD 06/22/2006  . Osteopenia 06/22/2006  . UTERINE CANCER, HX OF 06/22/2006  . DIVERTICULITIS, HX OF 06/22/2006    Lieutenant Diego PT, DPT 4:46 PM,01/20/18 [place] [facility] [address] [place], [address] Phone: [phone]   Fax:  [phone]  Name: Margaret Black MRN: [id number] Date of Birth: 03-17-1941

## 2018-01-21 ENCOUNTER — Ambulatory Visit: Payer: Medicare PPO | Admitting: Physical Therapy

## 2018-01-21 ENCOUNTER — Encounter: Payer: Medicare PPO | Admitting: Physical Therapy

## 2018-01-26 ENCOUNTER — Encounter: Payer: Self-pay | Admitting: Physical Therapy

## 2018-01-26 ENCOUNTER — Ambulatory Visit: Payer: Medicare PPO | Attending: Rheumatology | Admitting: Physical Therapy

## 2018-01-26 DIAGNOSIS — M6281 Muscle weakness (generalized): Secondary | ICD-10-CM | POA: Diagnosis not present

## 2018-01-26 DIAGNOSIS — M25511 Pain in right shoulder: Secondary | ICD-10-CM | POA: Diagnosis not present

## 2018-01-26 DIAGNOSIS — M5441 Lumbago with sciatica, right side: Secondary | ICD-10-CM | POA: Diagnosis not present

## 2018-01-26 DIAGNOSIS — M62838 Other muscle spasm: Secondary | ICD-10-CM | POA: Insufficient documentation

## 2018-01-26 DIAGNOSIS — G8929 Other chronic pain: Secondary | ICD-10-CM | POA: Diagnosis not present

## 2018-01-26 DIAGNOSIS — M5442 Lumbago with sciatica, left side: Secondary | ICD-10-CM | POA: Diagnosis not present

## 2018-01-26 NOTE — Therapy (Signed)
[place] [facility] [address] [place], [address] Phone: [phone]   Fax:  [phone]  Physical Therapy Treatment  Patient Details  Name: Margaret Black MRN: [id number] Date of Birth: February 02, 1941 Referring Provider (PT): Julieanne Manson Leeanne Mannan   Encounter Date: 01/26/2018  PT End of Session - 01/26/18 1214    Visit Number  5    Number of Visits  16    Date for PT Re-Evaluation  03/10/18    PT Start Time  1115    PT Stop Time  1200    PT Time Calculation (min)  45 min    Activity Tolerance  Patient tolerated treatment well    Behavior During Therapy  [facility] for tasks assessed/performed       Past Medical History:  Diagnosis Date  . Anxiety    Related to HTN and vision loss  . Central retinal vein occlusion of left eye    legally blind in Left eye  . 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 Collagen vascular disease ([place])    uncharacterized.  has seen rheum.  positive RF, positive serologies for Sjogrens.  C-ANCA/P-ANCA neg, ANA  neg, anti-dsDNA neg, anti-SCL70 neg, anti-centromere Ab neg  . COPD (chronic obstructive pulmonary disease) ([place])    quit smoking 1993  . Diverticulosis   . Dysfunction of eustachian tube   . Fatigue   . GERD (gastroesophageal reflux disease)   . Goiter, unspecified   . HLD (hyperlipidemia)   . HTN (hypertension)    difficult to control, hypertensive urgency hospitalizations, normal urine/plasma metanephrines and catecholamines, renal dopplers without stenosis  . Hyperaldosteronism    serum aldo/renin ration elevated (aldo 23.7, renin <0.15), CT abd 2010 with normal adrenals, dedicated adrenal CT without adrenal adenoma,   . Hypothyroid   . Insomnia    Related to HTN and vision loss  . Irritable bowel syndrome   . Leiomyosarcoma of uterus ([place]) 09/22/2014  . Lung cancer ([place])    s/p L lobectomy 2001  . Lupus ([place])   . Osteopenia   .  Pulmonary hypertension ([place])    echo [facility] 12/2008 with EF >5%, grade II diastolic dysfunction, RSVP >38mmHg, ACE level normal.  f/u echo at [place] - EF 55-60%, mild MR, mild LAE, PASP 25 + RA (no pulm HTN)  . Urge incontinence   . Uterine cancer [facility])    s/p hysterectomy 2001    Past Surgical History:  Procedure Laterality Date  . ABDOMINAL HYSTERECTOMY  06/1999  . APPENDECTOMY  1958  . CHOLECYSTECTOMY  1982  . CT ABD W & PELVIS WO CM  2010   neg except diverticulosis  . CT chest  2007   enlarged thyroid with goiter  . LOBECTOMY  04/1999   right lung  . THYROIDECTOMY    . TUBAL LIGATION  1975    There were no vitals filed for this visit.  Subjective Assessment - 01/26/18 1121    Subjective  Patient reports shoulder is feeling better today. Patient reports following last session she felt "really good" then her pain began again in bilat deep buttock R>L that increases to 5/10 when she sits.     Pertinent History   Patient is 77 yo female that complains of acute on chronic posterior hip/LBP. Stated that most of her pain is in her posterior hips, but recently has progressed to include midline of low back around lumbar  spine. Patient's main complaint is R shoulder pain and feels that her shoulder is her most limiting factor, PT has informed pt to obtain an order from her physician to include the shoulder moving forward. PMH significant and involved, including hx of thyroid, uterine, and lung cancer, COPD, lupus, IBS, and chronic kidney disease, L eye blindness. Patient complains of RUE pain that started several years ago after pushing/pulling heavy furniture, has had several shoulder injections in the past. Functional activities such as pushing/pulling/lifting/reaching aggravate her pain, rest relieves.     Limitations  Sitting;Walking;House hold activities;Lifting    How long can you sit comfortably?  less than 2 hrs    How long can you stand comfortably?  NA    How long can you walk  comfortably?  NA    Diagnostic tests  MRI: Mildly progressive disc bulging at L4-5 and mildly progressive facet arthrosis at L5-S1    Patient Stated Goals  main complaint is pain with sitting, new back pain with repositioning in bed    Pain Onset  More than a month ago    Pain Onset  More than a month ago       Therapeutic exercises: - Prone hip ext x10 each; followed by prone frog pump 2x 10 with min cuing initially for glute activation without lumbar ext with good carry over following - Bridge with small ball squeeze throughout 3x 10 with min cuing initially to prevent lumbar ext with good carry over - STS with glute activation for carry over x10 - Attempted doorway pec stretch (which patient reports she is unable to complete at home d/t pain, with multiple arm positions, with patient continuing to report discomfort without stretch. Discontinued  - Supine towel roll pec stretch 30sec hold with proper outcome (replaced HEP) - Attempted supine pirifomis/glute stretches multiple ways with discomfort. Discontinued - Seated piriformis stretch 30sec hold with addition of opposite  - HEP review with update of prone prop > prone lying  Manual therapy: STM and intermittent trigger pointing to bilat glute med/piriformis R>L  [email or web address]                     PT Education - 01/26/18 1212    Education Details  exercise form, HEP update    Person(s) Educated  Patient    Methods  Explanation;Demonstration;Verbal cues    Comprehension  Verbalized understanding;Returned demonstration;Verbal cues required       PT Short Term Goals - 01/13/18 1717      PT SHORT TERM GOAL #1   Title  Patient will be adherent to initial HEP at least 3x a week to improve functional strength and balance for better safety at home.    Baseline  administered today    Time  4    Period  Weeks    Status  New    Target Date  02/10/18        PT Long Term Goals - 01/15/18 0908      PT LONG TERM GOAL  #1   Title  Patient will increase BLE gross strength to 4+/5 as to improve functional strength for independent gait, increased standing tolerance and increased ADL ability.    Baseline  see current objective flowsheet for MMT grade    Time  8    Period  Weeks    Status  New    Target Date  03/10/18      PT LONG TERM GOAL #2   Title  Patient will  tolerate sitting unsupported demonstrating erect sitting posture with minimal thoracic kyphosis for 15+ minutes with maximum of 3/10 back pain to demonstrate improved back extensor strength and improved sitting tolerance.     Baseline  poor tolerance to sitting, dependent on the day, poor posture    Time  8    Period  Weeks    Status  New    Target Date  03/10/18      PT LONG TERM GOAL #3   Title  Patient will demonstrate at least 10 point improvement in FOTO score indicating improvement in ability to perform functional activities    Baseline  01/13/2018: 54    Period  Weeks    Status  New    Target Date  03/10/18      PT LONG TERM GOAL #4   Title  Patient will demonstrate bilateral UE MMT grade of at least 4+/5 to improve ability to perform functional activities    Time  8    Period  Weeks    Status  New    Target Date  03/10/18      PT LONG TERM GOAL #5   Title  Patient will report RUE pain of 3/10 or less with functional activities such as lifting, reaching, dressing, etc to demonstrate improved tolerance to activities.    Time  8    Period  Weeks    Status  New    Target Date  03/10/18            Plan - 01/26/18 1238    Clinical Impression Statement  Patient cheif complaint of bilat deep glute pain (R>L) this sesison, which was primary focus, as patient reports home shoulder exercises are "working". Patient responded well to manual techniques, reporting no pain with sitting following. PT spent time educating patient on glute activation to maintain pain relief at home for prime mover activation, instead of deep hip ER  compensation. Patient verbalized understanding of education and was able to complete therex with proper glute activation following PT cuing. PT will continue to work toward pain reduction and correcting movement patterns for long term pain relief.     Rehab Potential  Good    Clinical Impairments Affecting Rehab Potential  + motivation, symptom duration -, comorbidities    PT Frequency  2x / week    PT Duration  8 weeks    PT Treatment/Interventions  Electrical Stimulation;Therapeutic activities;Joint Manipulations;Spinal Manipulations;Taping;Vestibular;Passive range of motion;Dry needling;Patient/family education;Manual techniques;Therapeutic exercise;Balance training;Neuromuscular re-education;Stair training;Functional mobility training;Cryotherapy;Canalith Repostioning;Moist Heat;Gait training    PT Next Visit Plan  progress back exercsies, STM to hips and R bicep, add scapular strengthening    PT Home Exercise Plan  see patient instruction section: piriformis stretch, LTR, prone lying, standing extension, scapular retraction, ER with band Access Code: [email or web address] add prone press ups and standing extensions    Consulted and Agree with Plan of Care  Patient       Patient will benefit from skilled therapeutic intervention in order to improve the following deficits and impairments:  Decreased endurance, Decreased mobility, Difficulty walking, Increased muscle spasms, Improper body mechanics, Decreased range of motion, Cardiopulmonary status limiting activity, Decreased activity tolerance, Decreased strength, Pain, Postural dysfunction  Visit Diagnosis: Chronic bilateral low back pain with bilateral sciatica     Problem List Patient Active Problem List   Diagnosis Date Noted  . Anxiety 06/12/2017  . Rheumatoid factor positive 02/26/2017  . Brachial radiculitis 12/25/2016  . Closed traumatic dislocation of cervical vertebra 12/25/2016  .  Weakness of limb 12/25/2016  . Insomnia 02/08/2015  .  Carotid stenosis 02/08/2015  . Solitary pulmonary nodule 03/31/2014  . Systemic lupus ([place]) 12/15/2013  . Near syncope 05/03/2013  . Vertigo 08/09/2012  . History of lung cancer 07/14/2012  . Interstitial nephritis, acute 07/14/2012  . History of thyroid cancer 06/30/2012  . Livedo reticularis 06/11/2012  . Antiphospholipid antibody positive 06/11/2012  . CRVO (central retinal vein occlusion) 05/25/2012  . Rash 04/06/2012  . Syncope 09/29/2011  . Stage III chronic kidney disease ([place]) 10/09/2010  . HYPERALDOSTERONISM UNSPECIFIED 01/12/2009  . HYPERPLASIA, ADRENAL 01/12/2009  . HYPERTENSION, PULMONARY 01/04/2009  . COPD (chronic obstructive pulmonary disease) ([place]) 01/04/2009  . Blindness of left eye 12/12/2008  . IBS 07/12/2007  . Hypercholesterolemia 04/27/2007  . INCONTINENCE, URGE 04/23/2007  . Postsurgical hypothyroidism 06/22/2006  . Essential hypertension 06/22/2006  . GERD 06/22/2006  . Osteopenia 06/22/2006  . UTERINE CANCER, HX OF 06/22/2006  . DIVERTICULITIS, HX OF 06/22/2006   Shelton Silvas PT, DPT Shelton Silvas 01/26/2018, 12:44 PM  [facility] [facility] [address], [place], [address] Phone: [phone]   Fax:  [phone]  Name: MERDITH ADAN MRN: [id number] Date of Birth: 29-Mar-1940

## 2018-01-28 ENCOUNTER — Ambulatory Visit: Payer: Medicare PPO | Admitting: Physical Therapy

## 2018-01-28 ENCOUNTER — Encounter: Payer: Medicare PPO | Admitting: Physical Therapy

## 2018-01-28 ENCOUNTER — Encounter: Payer: Self-pay | Admitting: Physical Therapy

## 2018-01-28 DIAGNOSIS — M5441 Lumbago with sciatica, right side: Secondary | ICD-10-CM | POA: Diagnosis not present

## 2018-01-28 DIAGNOSIS — G8929 Other chronic pain: Secondary | ICD-10-CM | POA: Diagnosis not present

## 2018-01-28 DIAGNOSIS — M6281 Muscle weakness (generalized): Secondary | ICD-10-CM | POA: Diagnosis not present

## 2018-01-28 DIAGNOSIS — M5442 Lumbago with sciatica, left side: Principal | ICD-10-CM

## 2018-01-28 DIAGNOSIS — M62838 Other muscle spasm: Secondary | ICD-10-CM | POA: Diagnosis not present

## 2018-01-28 DIAGNOSIS — M25511 Pain in right shoulder: Secondary | ICD-10-CM | POA: Diagnosis not present

## 2018-01-28 NOTE — Therapy (Addendum)
[facility] [address] [place], [address] Phone: [phone]   Fax:  [phone]  Physical Therapy Treatment  Patient Details  Name: Margaret Black MRN: [id number] Date of Birth: [birth year] Referring Provider (PT): Julieanne Manson Leeanne Mannan   Encounter Date: 01/28/2018  PT End of Session - 01/28/18 1039    Visit Number  6    Number of Visits  16    Date for PT Re-Evaluation  03/10/18    Authorization Type  medicare 4/10    PT Start Time  1030    PT Stop Time  1115    PT Time Calculation (min)  45 min    Activity Tolerance  Patient tolerated treatment well    Behavior During Therapy  [facility] for tasks assessed/performed       Past Medical History:  Diagnosis Date  . Anxiety    Related to HTN and vision loss  . Central retinal vein occlusion of left eye    legally blind in Left eye  . 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 Collagen vascular disease ([place])    uncharacterized.  has seen rheum.  positive RF, positive serologies for Sjogrens.  C-ANCA/P-ANCA neg, ANA  neg, anti-dsDNA neg, anti-SCL70 neg, anti-centromere Ab neg  . COPD (chronic obstructive pulmonary disease) ([place])    quit smoking 1993  . Diverticulosis   . Dysfunction of eustachian tube   . Fatigue   . GERD (gastroesophageal reflux disease)   . Goiter, unspecified   . HLD (hyperlipidemia)   . HTN (hypertension)    difficult to control, hypertensive urgency hospitalizations, normal urine/plasma metanephrines and catecholamines, renal dopplers without stenosis  . Hyperaldosteronism    serum aldo/renin ration elevated (aldo 23.7, renin <0.15), CT abd 2010 with normal adrenals, dedicated adrenal CT without adrenal adenoma,   . Hypothyroid   . Insomnia    Related to HTN and vision loss  . Irritable bowel syndrome   . Leiomyosarcoma of uterus ([place]) 09/22/2014  . Lung cancer ([place])    s/p L lobectomy 2001  .  Lupus ([place])   . Osteopenia   . Pulmonary hypertension ([place])    echo [facility] 12/2008 with EF >5%, grade II diastolic dysfunction, RSVP >67mmHg, ACE level normal.  f/u echo at [facility] - EF 55-60%, mild MR, mild LAE, PASP 25 + RA (no pulm HTN)  . Urge incontinence   . Uterine cancer [facility])    s/p hysterectomy 2001    Past Surgical History:  Procedure Laterality Date  . ABDOMINAL HYSTERECTOMY  06/1999  . APPENDECTOMY  1958  . CHOLECYSTECTOMY  1982  . CT ABD W & PELVIS WO CM  2010   neg except diverticulosis  . CT chest  2007   enlarged thyroid with goiter  . LOBECTOMY  04/1999   right lung  . THYROIDECTOMY    . TUBAL LIGATION  1975    There were no vitals filed for this visit.  Subjective Assessment - 01/28/18 1034    Subjective  Patient reports her back feels much better and rates her pain today 0/10, but continues to  report some tension in bilat "deep glutes". Patient reports she thinks her pain is better from "PT magic fingers" with massage. Patient reports some "catching" in her shoulder today that she reports is 2/10 pain. Patient reports compliance with her HEP with questions or concerns.     Pertinent History  Patient is 77 yo female that complains of acute on chronic posterior hip/LBP. Stated that most of her pain is in her posterior hips, but recently has progressed to include midline of low back around lumbar spine. Patient's main complaint is R shoulder pain and feels that her shoulder is her most limiting factor, PT has informed pt to obtain an order from her physician to include the shoulder moving forward. PMH significant and involved, including hx of thyroid, uterine, and lung cancer, COPD, lupus, IBS, and chronic kidney disease, L eye blindness. Patient complains of RUE pain that started several years ago after pushing/pulling heavy furniture, has had several shoulder injections in the past. Functional activities such as pushing/pulling/lifting/reaching aggravate her pain, rest  relieves.     Limitations  Sitting;Walking;House hold activities;Lifting    How long can you sit comfortably?  less than 2 hrs    How long can you stand comfortably?  NA    How long can you walk comfortably?  NA    Diagnostic tests  MRI: Mildly progressive disc bulging at L4-5 and mildly progressive facet arthrosis at L5-S1    Patient Stated Goals  main complaint is pain with sitting, new back pain with repositioning in bed    Pain Onset  More than a month ago    Pain Onset  More than a month ago       Therapeutic exercises: - Frog pump 3x10 with min cuing for setup with good carry over following -pt reports no pain with good glute activation - Bridge with small ball squeeze throughout 3x 10 with min cuing initially for proper form with good carry over following -pt reports no pain with good glute activation   Manual therapy: -STM and intermittent trigger pointing to bilat glute med/piriformis R>L, and R bicep, pec minor, and ant deltoid,  with decreased muscle tension following - GHJ AP mob grade I-II 30sec bouts 6 bouts, moving into abd for 6 bouts for pain reduction with motion                        PT Education - 01/28/18 1038    Education Details  Exercise form    Person(s) Educated  Patient    Methods  Explanation;Verbal cues;Demonstration    Comprehension  Verbalized understanding;Verbal cues required;Returned demonstration       PT Short Term Goals - 01/13/18 1717      PT SHORT TERM GOAL #1   Title  Patient will be adherent to initial HEP at least 3x a week to improve functional strength and balance for better safety at home.    Baseline  administered today    Time  4    Period  Weeks    Status  New    Target Date  02/10/18        PT Long Term Goals - 01/15/18 0908      PT LONG TERM GOAL #1   Title  Patient will increase BLE gross strength to 4+/5 as to improve functional strength for independent gait, increased standing tolerance and  increased ADL ability.    Baseline  see current objective flowsheet for MMT grade    Time  8    Period  Weeks    Status  New    Target Date  03/10/18      PT LONG TERM GOAL #2   Title  Patient will tolerate sitting unsupported demonstrating erect sitting posture with minimal thoracic kyphosis for  15+ minutes with maximum of 3/10 back pain to demonstrate improved back extensor strength and improved sitting tolerance.     Baseline  poor tolerance to sitting, dependent on the day, poor posture    Time  8    Period  Weeks    Status  New    Target Date  03/10/18      PT LONG TERM GOAL #3   Title  Patient will demonstrate at least 10 point improvement in FOTO score indicating improvement in ability to perform functional activities    Baseline  01/13/2018: 54    Period  Weeks    Status  New    Target Date  03/10/18      PT LONG TERM GOAL #4   Title  Patient will demonstrate bilateral UE MMT grade of at least 4+/5 to improve ability to perform functional activities    Time  8    Period  Weeks    Status  New    Target Date  03/10/18      PT LONG TERM GOAL #5   Title  Patient will report RUE pain of 3/10 or less with functional activities such as lifting, reaching, dressing, etc to demonstrate improved tolerance to activities.    Time  8    Period  Weeks    Status  New    Target Date  03/10/18            Plan - 01/28/18 1309    Clinical Impression Statement  Patient continuing to report tension relief following manual techniques. Patient reports decreased shoulder pain to 0/10 following manual techniques. PT continued therex for glute max activation for carry over, with patient demonstrating good form and carry over with min cuing needed. PT educated patient on TDN for b/l deep hip tension for possible intervention. PT will follow up as able.     Rehab Potential  Good    Clinical Impairments Affecting Rehab Potential  + motivation, symptom duration -, comorbidities    PT  Frequency  2x / week    PT Duration  8 weeks    PT Treatment/Interventions  Electrical Stimulation;Therapeutic activities;Joint Manipulations;Spinal Manipulations;Taping;Vestibular;Passive range of motion;Dry needling;Patient/family education;Manual techniques;Therapeutic exercise;Balance training;Neuromuscular re-education;Stair training;Functional mobility training;Cryotherapy;Canalith Repostioning;Moist Heat;Gait training    PT Next Visit Plan  Possible TDN to b/l piriformis/deep hip ER, progress back exercsies, STM to hips and R bicep, add scapular strengthening    PT Home Exercise Plan  see patient instruction section: piriformis stretch, LTR, prone lying, standing extension, scapular retraction, ER with band Access Code: [email or web address] add prone press ups and standing extensions    Consulted and Agree with Plan of Care  Patient       Patient will benefit from skilled therapeutic intervention in order to improve the following deficits and impairments:  Decreased endurance, Decreased mobility, Difficulty walking, Increased muscle spasms, Improper body mechanics, Decreased range of motion, Cardiopulmonary status limiting activity, Decreased activity tolerance, Decreased strength, Pain, Postural dysfunction  Visit Diagnosis: Chronic bilateral low back pain with bilateral sciatica     Problem List Patient Active Problem List   Diagnosis Date Noted  . Anxiety 06/12/2017  . Rheumatoid factor positive 02/26/2017  . Brachial radiculitis 12/25/2016  . Closed traumatic dislocation of cervical vertebra 12/25/2016  . Weakness of limb 12/25/2016  . Insomnia 02/08/2015  . Carotid stenosis 02/08/2015  . Solitary pulmonary nodule 03/31/2014  . Systemic lupus ([place]) 12/15/2013  . Near syncope 05/03/2013  . Vertigo 08/09/2012  . History  of lung cancer 07/14/2012  . Interstitial nephritis, acute 07/14/2012  . History of thyroid cancer 06/30/2012  . Livedo reticularis 06/11/2012  . Antiphospholipid  antibody positive 06/11/2012  . CRVO (central retinal vein occlusion) 05/25/2012  . Rash 04/06/2012  . Syncope 09/29/2011  . Stage III chronic kidney disease ([place]) 10/09/2010  . HYPERALDOSTERONISM UNSPECIFIED 01/12/2009  . HYPERPLASIA, ADRENAL 01/12/2009  . HYPERTENSION, PULMONARY 01/04/2009  . COPD (chronic obstructive pulmonary disease) ([place]) 01/04/2009  . Blindness of left eye 12/12/2008  . IBS 07/12/2007  . Hypercholesterolemia 04/27/2007  . INCONTINENCE, URGE 04/23/2007  . Postsurgical hypothyroidism 06/22/2006  . Essential hypertension 06/22/2006  . GERD 06/22/2006  . Osteopenia 06/22/2006  . UTERINE CANCER, HX OF 06/22/2006  . DIVERTICULITIS, HX OF 06/22/2006   Shelton Silvas PT, DPT Shelton Silvas 01/29/2018, 9:02 AM  [facility] [address], [place], [address] Phone: [phone]   Fax:  [phone]  Name: Margaret Black MRN: [id number] Date of Birth: [birth year]

## 2018-02-01 ENCOUNTER — Encounter: Payer: Medicare PPO | Admitting: Physical Therapy

## 2018-02-02 ENCOUNTER — Ambulatory Visit: Payer: Medicare PPO | Admitting: Physical Therapy

## 2018-02-02 ENCOUNTER — Encounter: Payer: Self-pay | Admitting: Physical Therapy

## 2018-02-02 DIAGNOSIS — M5442 Lumbago with sciatica, left side: Principal | ICD-10-CM

## 2018-02-02 DIAGNOSIS — M5441 Lumbago with sciatica, right side: Secondary | ICD-10-CM | POA: Diagnosis not present

## 2018-02-02 DIAGNOSIS — M6281 Muscle weakness (generalized): Secondary | ICD-10-CM

## 2018-02-02 DIAGNOSIS — G8929 Other chronic pain: Secondary | ICD-10-CM | POA: Diagnosis not present

## 2018-02-02 DIAGNOSIS — M62838 Other muscle spasm: Secondary | ICD-10-CM | POA: Diagnosis not present

## 2018-02-02 DIAGNOSIS — M25511 Pain in right shoulder: Secondary | ICD-10-CM | POA: Diagnosis not present

## 2018-02-02 NOTE — Therapy (Signed)
[facility] [address] [place], [address] Phone: [phone]   Fax:  [phone]  Physical Therapy Treatment  Patient Details  Name: Margaret Black MRN: [id number] Date of Birth: April 09, 1940 Referring Provider (PT): Julieanne Manson Leeanne Mannan   Encounter Date: 02/02/2018  PT End of Session - 02/02/18 1557    Visit Number  7    Number of Visits  16    Date for PT Re-Evaluation  03/10/18    Authorization Type  medicare 5/10    PT Start Time  0330    PT Stop Time  0415    PT Time Calculation (min)  45 min    Activity Tolerance  Patient tolerated treatment well    Behavior During Therapy  [facility] for tasks assessed/performed       Past Medical History:  Diagnosis Date  . Anxiety    Related to HTN and vision loss  . Central retinal vein occlusion of left eye    legally blind in Left eye  . 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 Collagen vascular disease ([place])    uncharacterized.  has seen rheum.  positive RF, positive serologies for Sjogrens.  C-ANCA/P-ANCA neg, ANA  neg, anti-dsDNA neg, anti-SCL70 neg, anti-centromere Ab neg  . COPD (chronic obstructive pulmonary disease) ([place])    quit smoking 1993  . Diverticulosis   . Dysfunction of eustachian tube   . Fatigue   . GERD (gastroesophageal reflux disease)   . Goiter, unspecified   . HLD (hyperlipidemia)   . HTN (hypertension)    difficult to control, hypertensive urgency hospitalizations, normal urine/plasma metanephrines and catecholamines, renal dopplers without stenosis  . Hyperaldosteronism    serum aldo/renin ration elevated (aldo 23.7, renin <0.15), CT abd 2010 with normal adrenals, dedicated adrenal CT without adrenal adenoma,   . Hypothyroid   . Insomnia    Related to HTN and vision loss  . Irritable bowel syndrome   . Leiomyosarcoma of uterus ([place]) 09/22/2014  . Lung cancer ([place])    s/p L lobectomy 2001  .  Lupus ([place])   . Osteopenia   . Pulmonary hypertension ([place])    echo [facility] 12/2008 with EF >5%, grade II diastolic dysfunction, RSVP >60mmHg, ACE level normal.  f/u echo at [facility] - EF 55-60%, mild MR, mild LAE, PASP 25 + RA (no pulm HTN)  . Urge incontinence   . Uterine cancer [facility])    s/p hysterectomy 2001    Past Surgical History:  Procedure Laterality Date  . ABDOMINAL HYSTERECTOMY  06/1999  . APPENDECTOMY  1958  . CHOLECYSTECTOMY  1982  . CT ABD W & PELVIS WO CM  2010   neg except diverticulosis  . CT chest  2007   enlarged thyroid with goiter  . LOBECTOMY  04/1999   right lung  . THYROIDECTOMY    . TUBAL LIGATION  1975    There were no vitals filed for this visit.  Subjective Assessment - 02/02/18 1534    Subjective  Patient reports no pain in L glute today, and 4/10 pain in R glute, and R shoulder pain 4/10 as well. Patient reports over the weekend she only completed stretching on Sunday and that is "somewhat helped". Patient reports that overall her 'exercises must be helping because I was able to sit through the drive here with no pain", which she is pleased with.     Pertinent  History   Patient is 77 yo female that complains of acute on chronic posterior hip/LBP. Stated that most of her pain is in her posterior hips, but recently has progressed to include midline of low back around lumbar spine. Patient's main complaint is R shoulder pain and feels that her shoulder is her most limiting factor, PT has informed pt to obtain an order from her physician to include the shoulder moving forward. PMH significant and involved, including hx of thyroid, uterine, and lung cancer, COPD, lupus, IBS, and chronic kidney disease, L eye blindness. Patient complains of RUE pain that started several years ago after pushing/pulling heavy furniture, has had several shoulder injections in the past. Functional activities such as pushing/pulling/lifting/reaching aggravate her pain, rest relieves.      Limitations  Sitting;Walking;House hold activities;Lifting    How long can you sit comfortably?  less than 2 hrs    How long can you stand comfortably?  NA    How long can you walk comfortably?  NA    Diagnostic tests  MRI: Mildly progressive disc bulging at L4-5 and mildly progressive facet arthrosis at L5-S1    Patient Stated Goals  main complaint is pain with sitting, new back pain with repositioning in bed    Pain Onset  More than a month ago       Therapeutic exercises: - Bridge with small ball squeeze throughout 3x 10 with min cuing initially for proper form with good carry over following -pt reports no pain with good glute activation - OMEGA standing rows 5# 3x 10 with TC initially for proper form with good carry over following      - Education on postural muscle strengthening carry over to proper posture without shoulder hiking/froward head/rounded shoulders for shoulder pain.  - Educated patient on stress and tension and on how stress/pyschological factors incorporate into pain perception. Encouraged patient to spend time focusing on deep breathing and decreasing stressors.                                                           Manual therapy: -STM and intermittent trigger pointing toRglute med/piriformis, and R bicep, pec minor, and ant deltoid,  with decreased muscle tension following - GHJ AP mob grade I-II 30sec bouts 6 bout                        PT Education - 02/02/18 1547    Education Details  Exercise form    Person(s) Educated  Patient    Methods  Explanation;Demonstration;Verbal cues    Comprehension  Verbalized understanding;Returned demonstration;Verbal cues required       PT Short Term Goals - 01/13/18 1717      PT SHORT TERM GOAL #1   Title  Patient will be adherent to initial HEP at least 3x a week to improve functional strength and balance for better safety at home.    Baseline  administered today    Time  4    Period  Weeks     Status  New    Target Date  02/10/18        PT Long Term Goals - 01/15/18 0908      PT LONG TERM GOAL #1   Title  Patient will increase BLE gross  strength to 4+/5 as to improve functional strength for independent gait, increased standing tolerance and increased ADL ability.    Baseline  see current objective flowsheet for MMT grade    Time  8    Period  Weeks    Status  New    Target Date  03/10/18      PT LONG TERM GOAL #2   Title  Patient will tolerate sitting unsupported demonstrating erect sitting posture with minimal thoracic kyphosis for 15+ minutes with maximum of 3/10 back pain to demonstrate improved back extensor strength and improved sitting tolerance.     Baseline  poor tolerance to sitting, dependent on the day, poor posture    Time  8    Period  Weeks    Status  New    Target Date  03/10/18      PT LONG TERM GOAL #3   Title  Patient will demonstrate at least 10 point improvement in FOTO score indicating improvement in ability to perform functional activities    Baseline  01/13/2018: 54    Period  Weeks    Status  New    Target Date  03/10/18      PT LONG TERM GOAL #4   Title  Patient will demonstrate bilateral UE MMT grade of at least 4+/5 to improve ability to perform functional activities    Time  8    Period  Weeks    Status  New    Target Date  03/10/18      PT LONG TERM GOAL #5   Title  Patient will report RUE pain of 3/10 or less with functional activities such as lifting, reaching, dressing, etc to demonstrate improved tolerance to activities.    Time  8    Period  Weeks    Status  New    Target Date  03/10/18            Plan - 02/02/18 2056    Clinical Impression Statement  Patient is continuint to report reduced pain post session. PT continued postural strengthening therex with education on carry over into ADLs. Patient reports that she is very "high strung" and PT educated her on stress and it's implications on pain resposne; advising  patient to try to destress with massage, walking, meditation, etc. Patient verbalized understanding of all provided education.     Rehab Potential  Good    Clinical Impairments Affecting Rehab Potential  + motivation, symptom duration -, comorbidities    PT Frequency  2x / week    PT Duration  8 weeks    PT Treatment/Interventions  Electrical Stimulation;Therapeutic activities;Joint Manipulations;Spinal Manipulations;Taping;Vestibular;Passive range of motion;Dry needling;Patient/family education;Manual techniques;Therapeutic exercise;Balance training;Neuromuscular re-education;Stair training;Functional mobility training;Cryotherapy;Canalith Repostioning;Moist Heat;Gait training    PT Next Visit Plan  Possible TDN to b/l piriformis/deep hip ER, progress back exercsies, STM to hips and R bicep, add scapular strengthening    PT Home Exercise Plan  see patient instruction section: piriformis stretch, LTR, prone lying, standing extension, scapular retraction, ER with band Access Code: [email or web address] add prone press ups and standing extensions    Consulted and Agree with Plan of Care  Patient       Patient will benefit from skilled therapeutic intervention in order to improve the following deficits and impairments:  Decreased endurance, Decreased mobility, Difficulty walking, Increased muscle spasms, Improper body mechanics, Decreased range of motion, Cardiopulmonary status limiting activity, Decreased activity tolerance, Decreased strength, Pain, Postural dysfunction  Visit Diagnosis: Chronic bilateral low back  pain with bilateral sciatica  Muscle weakness (generalized)     Problem List Patient Active Problem List   Diagnosis Date Noted  . Anxiety 06/12/2017  . Rheumatoid factor positive 02/26/2017  . Brachial radiculitis 12/25/2016  . Closed traumatic dislocation of cervical vertebra 12/25/2016  . Weakness of limb 12/25/2016  . Insomnia 02/08/2015  . Carotid stenosis 02/08/2015  . Solitary  pulmonary nodule 03/31/2014  . Systemic lupus ([place]) 12/15/2013  . Near syncope 05/03/2013  . Vertigo 08/09/2012  . History of lung cancer 07/14/2012  . Interstitial nephritis, acute 07/14/2012  . History of thyroid cancer 06/30/2012  . Livedo reticularis 06/11/2012  . Antiphospholipid antibody positive 06/11/2012  . CRVO (central retinal vein occlusion) 05/25/2012  . Rash 04/06/2012  . Syncope 09/29/2011  . Stage III chronic kidney disease ([place]) 10/09/2010  . HYPERALDOSTERONISM UNSPECIFIED 01/12/2009  . HYPERPLASIA, ADRENAL 01/12/2009  . HYPERTENSION, PULMONARY 01/04/2009  . COPD (chronic obstructive pulmonary disease) ([place]) 01/04/2009  . Blindness of left eye 12/12/2008  . IBS 07/12/2007  . Hypercholesterolemia 04/27/2007  . INCONTINENCE, URGE 04/23/2007  . Postsurgical hypothyroidism 06/22/2006  . Essential hypertension 06/22/2006  . GERD 06/22/2006  . Osteopenia 06/22/2006  . UTERINE CANCER, HX OF 06/22/2006  . DIVERTICULITIS, HX OF 06/22/2006   Shelton Silvas PT, DPT Shelton Silvas 02/02/2018, 9:19 PM  [facility] [address] [place], [address] Phone: [phone]   Fax:  [phone]  Name: Margaret Black MRN: [id number] Date of Birth: January 01, 1941

## 2018-02-03 ENCOUNTER — Encounter: Payer: Self-pay | Admitting: Nurse Practitioner

## 2018-02-03 ENCOUNTER — Ambulatory Visit: Payer: Medicare PPO | Admitting: Nurse Practitioner

## 2018-02-03 ENCOUNTER — Encounter: Payer: Medicare PPO | Admitting: Physical Therapy

## 2018-02-03 ENCOUNTER — Telehealth: Payer: Self-pay | Admitting: Nurse Practitioner

## 2018-02-03 VITALS — BP 113/51 | HR 57 | Temp 98.2°F | Ht 65.0 in | Wt 133.4 lb

## 2018-02-03 DIAGNOSIS — E039 Hypothyroidism, unspecified: Secondary | ICD-10-CM

## 2018-02-03 DIAGNOSIS — R300 Dysuria: Secondary | ICD-10-CM | POA: Diagnosis not present

## 2018-02-03 DIAGNOSIS — N3001 Acute cystitis with hematuria: Secondary | ICD-10-CM | POA: Diagnosis not present

## 2018-02-03 LAB — POCT URINALYSIS DIPSTICK
Bilirubin, UA: NEGATIVE
Glucose, UA: NEGATIVE
Ketones, UA: NEGATIVE
Nitrite, UA: NEGATIVE
Protein, UA: POSITIVE — AB
Spec Grav, UA: 1.01 (ref 1.010–1.025)
Urobilinogen, UA: 0.2 E.U./dL
pH, UA: 5 (ref 5.0–8.0)

## 2018-02-03 LAB — TSH+FREE T4: TSH W/REFLEX TO FT4: 1.64 mIU/L (ref 0.40–4.50)

## 2018-02-03 MED ORDER — CEPHALEXIN 500 MG PO CAPS: 500.0000 mg | ORAL_CAPSULE | Freq: Three times a day (TID) | ORAL | 0 refills | Status: AC

## 2018-02-03 NOTE — Patient Instructions (Addendum)
Margaret Black,   Thank you for coming in to clinic today.  1. You have a UTI. - START Keflex 500 mg three times daily (about every 8 hours) for 5 days. - Please also start taking a probiotic.  Antibiotics kill good and bad bacteria.  A probiotic helps to replace your good bacteria. Probiotic pills can be found over the counter.  One brand is Florastor, but you can use any brand you prefer.  You can also get good bacteria from foods.  These foods are yogurt, kefir, kombucha, and fresh, refrigerated and uncooked sauerkraut or kimchi. Yogurt is usually preferred and is a great option. - May take Azo if needed for 2-3 days for urinary discomfort.  Please schedule a follow-up appointment with Cassell Smiles, AGNP. Return in about 4 months (around 06/04/2018), or 5-7 days if symptoms worsen or fail to improve, for hypothyroidism, hypertension.  If you have any other questions or concerns, please feel free to call the clinic or send a message through [place]. You may also schedule an earlier appointment if necessary.  You will receive a survey after today's visit either digitally by e-mail or paper by [employer]. Your experiences and feedback matter to [place].  Please respond so we know how we are doing as we provide care for you.   Cassell Smiles, DNP, AGNP-BC Adult Gerontology Nurse Practitioner [place]

## 2018-02-03 NOTE — Telephone Encounter (Signed)
.  mychart

## 2018-02-03 NOTE — Telephone Encounter (Signed)
Done at todays visit

## 2018-02-03 NOTE — Telephone Encounter (Signed)
Pt asked to have a lab order to check thyroid.  Her call back number is [phone]

## 2018-02-03 NOTE — Progress Notes (Signed)
Subjective:    [name], female    DOB: 1941/03/06, 77 y.o.   MRN: [id number]  Margaret Black is a 77 y.o. female presenting on 02/03/2018 for Dysuria (urinary frequency, urinary hesitancy x  1 week) and Fatigue   HPI Dysuria Over last 1 week has had fatigue of unknown cause.  She had onset of urinary frequency/urgency, urinary hesitancy, burning on urination about 2 weeks ago.  She states with UTI she normally has episode of hematuria, which she has not had over the last 2 weeks.  She currently denies fever, chills, or sweats.  She regularly has dysuria associated with atrophic vaginitis, well controlled today per patient report.  Inner lip/buccal tissue lesions  - Patient notes she has had these x 5 years off and on. These are not painful.  Fatigue Patient also has concerns that her fatigue could be from thyroid.  States she was not able to get TSH checked with Dr. Holley Raring as planned at last visit and would like it to be rechecked.  Social History   Tobacco Use  . Smoking status: Former Smoker    Types: Cigarettes    Last attempt to quit: 07/04/1991    Years since quitting: 26.6  . Smokeless tobacco: Never Used  . Tobacco comment: Quit in early 1980's.  Substance Use Topics  . Alcohol use: No  . Drug use: No    Review of Systems Per HPI unless specifically indicated above     Objective:    BP (!) 113/51 (BP Location: Right Arm, Patient Position: Sitting, Cuff Size: Normal)   Pulse (!) 57   Temp 98.2 F (36.8 C) (Oral)   Ht 5\' 5"  (1.651 m)   Wt 133 lb 6.4 oz (60.5 kg)   BMI 22.20 kg/m   Wt Readings from Last 3 Encounters:  02/03/18 133 lb 6.4 oz (60.5 kg)  12/15/17 130 lb 3.2 oz (59.1 kg)  08/07/17 130 lb 12.8 oz (59.3 kg)    Physical Exam  Constitutional: She is oriented to person, place, and time. She appears well-developed and well-nourished. No distress.  HENT:  Head: Normocephalic and atraumatic.  Cardiovascular: Normal rate, regular rhythm,  S1 normal, S2 normal, normal heart sounds and intact distal pulses.  Pulmonary/Chest: Effort normal and breath sounds normal. No respiratory distress.  Abdominal: Soft. Bowel sounds are normal. There is no tenderness (no suprapubic tenderness). There is no CVA tenderness.  Neurological: She is alert and oriented to person, place, and time.  Skin: Skin is warm and dry.  Psychiatric: She has a normal mood and affect. Her behavior is normal.  Vitals reviewed.  Results for orders placed or performed in visit on 02/03/18  POCT Urinalysis Dipstick  Result Value Ref Range   Color, UA amber    Clarity, UA clear    Glucose, UA Negative Negative   Bilirubin, UA negative    Ketones, UA negative    Spec Grav, UA 1.010 1.010 - 1.025   Blood, UA large    pH, UA 5.0 5.0 - 8.0   Protein, UA Positive (A) Negative   Urobilinogen, UA 0.2 0.2 or 1.0 E.U./dL   Nitrite, UA negative    Leukocytes, UA Large (3+) (A) Negative   Appearance     Odor foul       Assessment & Plan:   Problem List Items Addressed This Visit    None    Visit Diagnoses    Dysuria    -  Primary   Relevant Orders   POCT Urinalysis Dipstick (Completed)   Urine Culture   Acute cystitis with hematuria     Acute cystitis with hematuria.  Pt symptomatic currently with increased urinary frequency, urgency x 14 days. Currently without systemic signs or symptoms of infection other than fatigue.   - No current risk of concurrent STI.  Plan: 1. START Keflex 500mg  3 times daily for next 5 days.   - Send Urine culture  - May also take AZO prn for next 2-3 days OTC. 2. Provided non-pharm measures for UTI prevention for good hygiene. 3. Drink plenty of fluids and improve hydration over next 1 week. 4. Provided precautions for severe symptoms requiring ED visit to include no urine in 24-48 hours. 5. Followup 2-5 days as needed for worsening or persistent symptoms.   Relevant Medications   cephALEXin (KEFLEX) 500 MG capsule    Acquired hypothyroidism     Status unknown.  Recheck labs.  Continue meds without changes today.  Refills provided. Followup in 4 months for regular 6 month followup.    Relevant Orders   TSH + free T4      Meds ordered this encounter  Medications  . cephALEXin (KEFLEX) 500 MG capsule    Sig: Take 1 capsule (500 mg total) by mouth 3 (three) times daily for 5 days.    Dispense:  15 capsule    Refill:  0    Order Specific Question:   Supervising Provider    Answer:   Olin Hauser [[id number]]    Follow up plan: Return in about 4 months (around 06/04/2018), or 5-7 days if symptoms worsen or fail to improve, for hypothyroidism, hypertension.  Cassell Smiles, DNP, AGPCNP-BC Adult Gerontology Primary Care Nurse Practitioner [facility] 02/03/2018, 11:47 AM

## 2018-02-04 ENCOUNTER — Encounter: Payer: Self-pay | Admitting: Physical Therapy

## 2018-02-04 ENCOUNTER — Ambulatory Visit: Payer: Medicare PPO | Admitting: Physical Therapy

## 2018-02-04 ENCOUNTER — Other Ambulatory Visit: Payer: Self-pay | Admitting: Nurse Practitioner

## 2018-02-04 DIAGNOSIS — M25511 Pain in right shoulder: Secondary | ICD-10-CM | POA: Diagnosis not present

## 2018-02-04 DIAGNOSIS — E890 Postprocedural hypothyroidism: Secondary | ICD-10-CM

## 2018-02-04 DIAGNOSIS — M5442 Lumbago with sciatica, left side: Principal | ICD-10-CM

## 2018-02-04 DIAGNOSIS — M62838 Other muscle spasm: Secondary | ICD-10-CM

## 2018-02-04 DIAGNOSIS — G8929 Other chronic pain: Secondary | ICD-10-CM | POA: Diagnosis not present

## 2018-02-04 DIAGNOSIS — M6281 Muscle weakness (generalized): Secondary | ICD-10-CM | POA: Diagnosis not present

## 2018-02-04 DIAGNOSIS — M5441 Lumbago with sciatica, right side: Principal | ICD-10-CM

## 2018-02-04 MED ORDER — SYNTHROID 88 MCG PO TABS: 88.0000 ug | ORAL_TABLET | Freq: Every day | ORAL | 1 refills | Status: DC

## 2018-02-04 NOTE — Therapy (Signed)
[facility] [address] [place], [address] Phone: [phone]   Fax:  [phone]  Physical Therapy Treatment  Patient Details  Name: Margaret Black MRN: [id number] Date of Birth: [birth year] Referring Provider (PT): Julieanne Manson Leeanne Mannan   Encounter Date: 02/04/2018  PT End of Session - 02/04/18 1257    Visit Number  8    Number of Visits  16    Date for PT Re-Evaluation  03/10/18    Authorization Type  medicare 6/10    PT Start Time  3785    PT Stop Time  1100    PT Time Calculation (min)  45 min    Activity Tolerance  Patient tolerated treatment well    Behavior During Therapy  [facility] for tasks assessed/performed       Past Medical History:  Diagnosis Date  . Anxiety    Related to HTN and vision loss  . Central retinal vein occlusion of left eye    legally blind in Left eye  . 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 Collagen vascular disease ([place])    uncharacterized.  has seen rheum.  positive RF, positive serologies for Sjogrens.  C-ANCA/P-ANCA neg, ANA  neg, anti-dsDNA neg, anti-SCL70 neg, anti-centromere Ab neg  . COPD (chronic obstructive pulmonary disease) ([place])    quit smoking 1993  . Diverticulosis   . Dysfunction of eustachian tube   . Fatigue   . GERD (gastroesophageal reflux disease)   . Goiter, unspecified   . HLD (hyperlipidemia)   . HTN (hypertension)    difficult to control, hypertensive urgency hospitalizations, normal urine/plasma metanephrines and catecholamines, renal dopplers without stenosis  . Hyperaldosteronism    serum aldo/renin ration elevated (aldo 23.7, renin <0.15), CT abd 2010 with normal adrenals, dedicated adrenal CT without adrenal adenoma,   . Hypothyroid   . Insomnia    Related to HTN and vision loss  . Irritable bowel syndrome   . Leiomyosarcoma of uterus ([place]) 09/22/2014  . Lung cancer ([place])    s/p L lobectomy 2001  .  Lupus ([place])   . Osteopenia   . Pulmonary hypertension ([place])    echo [facility] 12/2008 with EF >5%, grade II diastolic dysfunction, RSVP >66mmHg, ACE level normal.  f/u echo at [facility] - EF 55-60%, mild MR, mild LAE, PASP 25 + RA (no pulm HTN)  . Urge incontinence   . Uterine cancer [facility])    s/p hysterectomy 2001    Past Surgical History:  Procedure Laterality Date  . ABDOMINAL HYSTERECTOMY  06/1999  . APPENDECTOMY  1958  . CHOLECYSTECTOMY  1982  . CT ABD W & PELVIS WO CM  2010   neg except diverticulosis  . CT chest  2007   enlarged thyroid with goiter  . LOBECTOMY  04/1999   right lung  . THYROIDECTOMY    . TUBAL LIGATION  1975    There were no vitals filed for this visit.  Subjective Assessment - 02/04/18 1022    Subjective  Patient states that she is feeling better than when she started. She continues to not trust her R shoulder/arm to lift anything of weight, but notes that she is pain free at rest. Patient is able sit upright without too much discomfort through her back and hips.    Pertinent History   Patient is 77 yo female that complains of acute on chronic posterior hip/LBP. Stated that  most of her pain is in her posterior hips, but recently has progressed to include midline of low back around lumbar spine. Patient's main complaint is R shoulder pain and feels that her shoulder is her most limiting factor, PT has informed pt to obtain an order from her physician to include the shoulder moving forward. PMH significant and involved, including hx of thyroid, uterine, and lung cancer, COPD, lupus, IBS, and chronic kidney disease, L eye blindness. Patient complains of RUE pain that started several years ago after pushing/pulling heavy furniture, has had several shoulder injections in the past. Functional activities such as pushing/pulling/lifting/reaching aggravate her pain, rest relieves.     Limitations  Sitting;Walking;House hold activities;Lifting    How long can you sit comfortably?   less than 2 hrs    How long can you stand comfortably?  NA    How long can you walk comfortably?  NA    Diagnostic tests  MRI: Mildly progressive disc bulging at L4-5 and mildly progressive facet arthrosis at L5-S1    Patient Stated Goals  main complaint is pain with sitting, new back pain with repositioning in bed    Currently in Pain?  Yes    Pain Score  1     Pain Location  Back    Pain Orientation  Right;Left    Pain Descriptors / Indicators  Aching    Pain Type  Chronic pain    Multiple Pain Sites  No    Pain Onset  More than a month ago       TREATMENT  Therapeutic Exercise: Bridge 3 x 10 x 5 sec hold. VCs for hip/knee/ankle alignment; and elbow contact point for force distribution. Bridge L B-Stance 2x5 Seated cat-cow/slouch-overcorrect x5  Patient provided with extensive education on the benefits of strengthening now that her pain level has remained decreased over multiple sessions. Patient also educated on activity modifications and adaptations including: standing breaks and seated cat-cow during bridge games/plane rides, washcloth roll grip for resistance band scapular/postural strengthening, and the importance of maintaining and creating more body awareness as a way to incorporate more movement throughout the day. Additionally, patient was educated on the progression of PT and PT as a tool to empower independent and active management of her pain and body.  Manual Therapy: Patient notes that overall she feels much less tension in her upper and lower body, but still finds STM and TPR to be an important adjunct to her treatment. STM B glute max/ glute med/ piriformis STM R periscapular musculature and upper trapezius      PT Education - 02/04/18 1256    Education Details  activity modification, body mechanics, and exercise progression in the absence of pain    Person(s) Educated  Patient    Methods  Explanation;Demonstration;Tactile cues;Verbal cues    Comprehension   Verbalized understanding;Returned demonstration;Need further instruction       PT Short Term Goals - 01/13/18 1717      PT SHORT TERM GOAL #1   Title  Patient will be adherent to initial HEP at least 3x a week to improve functional strength and balance for better safety at home.    Baseline  administered today    Time  4    Period  Weeks    Status  New    Target Date  02/10/18        PT Long Term Goals - 01/15/18 0908      PT LONG TERM GOAL #1   Title  Patient will increase BLE gross strength to 4+/5 as to improve functional strength for independent gait, increased standing tolerance and increased ADL ability.    Baseline  see current objective flowsheet for MMT grade    Time  8    Period  Weeks    Status  New    Target Date  03/10/18      PT LONG TERM GOAL #2   Title  Patient will tolerate sitting unsupported demonstrating erect sitting posture with minimal thoracic kyphosis for 15+ minutes with maximum of 3/10 back pain to demonstrate improved back extensor strength and improved sitting tolerance.     Baseline  poor tolerance to sitting, dependent on the day, poor posture    Time  8    Period  Weeks    Status  New    Target Date  03/10/18      PT LONG TERM GOAL #3   Title  Patient will demonstrate at least 10 point improvement in FOTO score indicating improvement in ability to perform functional activities    Baseline  01/13/2018: 54    Period  Weeks    Status  New    Target Date  03/10/18      PT LONG TERM GOAL #4   Title  Patient will demonstrate bilateral UE MMT grade of at least 4+/5 to improve ability to perform functional activities    Time  8    Period  Weeks    Status  New    Target Date  03/10/18      PT LONG TERM GOAL #5   Title  Patient will report RUE pain of 3/10 or less with functional activities such as lifting, reaching, dressing, etc to demonstrate improved tolerance to activities.    Time  8    Period  Weeks    Status  New    Target Date   03/10/18            Plan - 02/04/18 1259    Clinical Impression Statement  Patient presents to clinic with maintained lower pain levels and improving activity tolerance and is amenable to therapy. Patient benefits from education on the trajectory of PT interventions in relation to her goals and overall function. Patient demonstrates improving body awareness and strength as evidenced by her ability to maintain proper alignment during glute strengthening with minimal VCs and no TCs. Patient will continue to benefit from skilled therapeutic intervention to address remaining deficits in strength and activity tolerance in order to return to her PLOF and improver overall QOL.   Rehab Potential  Good    Clinical Impairments Affecting Rehab Potential  + motivation, symptom duration -, comorbidities    PT Frequency  2x / week    PT Duration  8 weeks    PT Treatment/Interventions  Electrical Stimulation;Therapeutic activities;Joint Manipulations;Spinal Manipulations;Taping;Vestibular;Passive range of motion;Dry needling;Patient/family education;Manual techniques;Therapeutic exercise;Balance training;Neuromuscular re-education;Stair training;Functional mobility training;Cryotherapy;Canalith Repostioning;Moist Heat;Gait training    PT Next Visit Plan  Possible TDN to b/l piriformis/deep hip ER, progress back exercsies, STM to hips and R bicep, add scapular strengthening    PT Home Exercise Plan  see patient instruction section: piriformis stretch, LTR, prone lying, standing extension, scapular retraction, ER with band Access Code: [email or web address] add prone press ups and standing extensions    Consulted and Agree with Plan of Care  Patient       Patient will benefit from skilled therapeutic intervention in order to improve the following deficits and impairments:  Decreased  endurance, Decreased mobility, Difficulty walking, Increased muscle spasms, Improper body mechanics, Decreased range of motion, Cardiopulmonary  status limiting activity, Decreased activity tolerance, Decreased strength, Pain, Postural dysfunction  Visit Diagnosis: Chronic bilateral low back pain with bilateral sciatica  Muscle weakness (generalized)  Other muscle spasm  Chronic right shoulder pain     Problem List Patient Active Problem List   Diagnosis Date Noted  . Anxiety 06/12/2017  . Rheumatoid factor positive 02/26/2017  . Brachial radiculitis 12/25/2016  . Closed traumatic dislocation of cervical vertebra 12/25/2016  . Weakness of limb 12/25/2016  . Insomnia 02/08/2015  . Carotid stenosis 02/08/2015  . Solitary pulmonary nodule 03/31/2014  . Systemic lupus ([place]) 12/15/2013  . Near syncope 05/03/2013  . Vertigo 08/09/2012  . History of lung cancer 07/14/2012  . Interstitial nephritis, acute 07/14/2012  . History of thyroid cancer 06/30/2012  . Livedo reticularis 06/11/2012  . Antiphospholipid antibody positive 06/11/2012  . CRVO (central retinal vein occlusion) 05/25/2012  . Rash 04/06/2012  . Syncope 09/29/2011  . Stage III chronic kidney disease ([place]) 10/09/2010  . HYPERALDOSTERONISM UNSPECIFIED 01/12/2009  . HYPERPLASIA, ADRENAL 01/12/2009  . HYPERTENSION, PULMONARY 01/04/2009  . COPD (chronic obstructive pulmonary disease) ([place]) 01/04/2009  . Blindness of left eye 12/12/2008  . IBS 07/12/2007  . Hypercholesterolemia 04/27/2007  . INCONTINENCE, URGE 04/23/2007  . Postsurgical hypothyroidism 06/22/2006  . Essential hypertension 06/22/2006  . GERD 06/22/2006  . Osteopenia 06/22/2006  . UTERINE CANCER, HX OF 06/22/2006  . DIVERTICULITIS, HX OF 06/22/2006   Myles Gip PT, DPT [phone] 02/04/2018, 1:01 PM  [facility] [address] [place], [address] Phone: [phone]   Fax:  [phone]  Name: AMRI LIEN MRN: [id number] Date of Birth: 1940-10-29

## 2018-02-05 LAB — URINE CULTURE
MICRO NUMBER:: 91367396
SPECIMEN QUALITY:: ADEQUATE

## 2018-02-08 ENCOUNTER — Encounter: Payer: Medicare PPO | Admitting: Physical Therapy

## 2018-02-10 ENCOUNTER — Encounter: Payer: Medicare PPO | Admitting: Physical Therapy

## 2018-02-11 ENCOUNTER — Encounter: Payer: Medicare PPO | Admitting: Physical Therapy

## 2018-02-12 ENCOUNTER — Encounter: Payer: Self-pay | Admitting: Nurse Practitioner

## 2018-02-15 ENCOUNTER — Encounter: Payer: Medicare PPO | Admitting: Physical Therapy

## 2018-02-16 ENCOUNTER — Ambulatory Visit: Payer: Medicare PPO | Admitting: Physical Therapy

## 2018-02-17 ENCOUNTER — Encounter: Payer: Medicare PPO | Admitting: Physical Therapy

## 2018-02-22 ENCOUNTER — Encounter: Payer: Medicare PPO | Admitting: Physical Therapy

## 2018-02-23 ENCOUNTER — Ambulatory Visit: Payer: Medicare PPO | Admitting: Physical Therapy

## 2018-02-24 ENCOUNTER — Encounter: Payer: Medicare PPO | Admitting: Physical Therapy

## 2018-02-25 ENCOUNTER — Ambulatory Visit: Payer: Medicare PPO | Admitting: Physical Therapy

## 2018-03-01 ENCOUNTER — Encounter: Payer: Medicare PPO | Admitting: Physical Therapy

## 2018-03-01 ENCOUNTER — Other Ambulatory Visit: Payer: Self-pay | Admitting: Nurse Practitioner

## 2018-03-01 DIAGNOSIS — F5104 Psychophysiologic insomnia: Secondary | ICD-10-CM

## 2018-03-01 MED ORDER — SPIRONOLACTONE 25 MG PO TABS: 25.0000 mg | ORAL_TABLET | Freq: Two times a day (BID) | ORAL | 2 refills | Status: DC

## 2018-03-01 MED ORDER — LORAZEPAM 0.5 MG PO TABS: 0.5000 mg | ORAL_TABLET | Freq: Every evening | ORAL | 4 refills | Status: DC | PRN

## 2018-03-01 NOTE — Telephone Encounter (Signed)
Pt needs refills on spironolactone and lorazepam sent to [facility].

## 2018-03-02 ENCOUNTER — Encounter: Payer: Self-pay | Admitting: Physical Therapy

## 2018-03-02 ENCOUNTER — Ambulatory Visit: Payer: Medicare PPO | Attending: Rheumatology | Admitting: Physical Therapy

## 2018-03-02 DIAGNOSIS — G8929 Other chronic pain: Secondary | ICD-10-CM | POA: Insufficient documentation

## 2018-03-02 DIAGNOSIS — M5442 Lumbago with sciatica, left side: Secondary | ICD-10-CM | POA: Diagnosis not present

## 2018-03-02 DIAGNOSIS — M5441 Lumbago with sciatica, right side: Secondary | ICD-10-CM | POA: Insufficient documentation

## 2018-03-02 NOTE — Therapy (Signed)
[facility] [address] [place], [address] Phone: [phone]   Fax:  [phone]  Physical Therapy Treatment  Patient Details  Name: Margaret Black MRN: [id number] Date of Birth: Dec 01, 1940 Referring Provider (PT): Julieanne Manson Leeanne Mannan   Encounter Date: 03/02/2018  PT End of Session - 03/02/18 1423    Visit Number  9    Number of Visits  16    Date for PT Re-Evaluation  03/10/18    Authorization Type  medicare 1/10    PT Start Time  0945    PT Stop Time  1030    PT Time Calculation (min)  45 min    Activity Tolerance  Patient tolerated treatment well    Behavior During Therapy  [facility] for tasks assessed/performed       Past Medical History:  Diagnosis Date  . Anxiety    Related to HTN and vision loss  . Central retinal vein occlusion of left eye    legally blind in Left eye  . 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 Collagen vascular disease ([place])    uncharacterized.  has seen rheum.  positive RF, positive serologies for Sjogrens.  C-ANCA/P-ANCA neg, ANA  neg, anti-dsDNA neg, anti-SCL70 neg, anti-centromere Ab neg  . COPD (chronic obstructive pulmonary disease) ([place])    quit smoking 1993  . Diverticulosis   . Dysfunction of eustachian tube   . Fatigue   . GERD (gastroesophageal reflux disease)   . Goiter, unspecified   . HLD (hyperlipidemia)   . HTN (hypertension)    difficult to control, hypertensive urgency hospitalizations, normal urine/plasma metanephrines and catecholamines, renal dopplers without stenosis  . Hyperaldosteronism    serum aldo/renin ration elevated (aldo 23.7, renin <0.15), CT abd 2010 with normal adrenals, dedicated adrenal CT without adrenal adenoma,   . Hypothyroid   . Insomnia    Related to HTN and vision loss  . Irritable bowel syndrome   . Leiomyosarcoma of uterus ([place]) 09/22/2014  . Lung cancer ([place])    s/p L lobectomy 2001  .  Lupus ([place])   . Osteopenia   . Pulmonary hypertension ([place])    echo [facility] 12/2008 with EF >5%, grade II diastolic dysfunction, RSVP >15mmHg, ACE level normal.  f/u echo at [facility] - EF 55-60%, mild MR, mild LAE, PASP 25 + RA (no pulm HTN)  . Urge incontinence   . Uterine cancer [facility])    s/p hysterectomy 2001    Past Surgical History:  Procedure Laterality Date  . ABDOMINAL HYSTERECTOMY  06/1999  . APPENDECTOMY  1958  . CHOLECYSTECTOMY  1982  . CT ABD W & PELVIS WO CM  2010   neg except diverticulosis  . CT chest  2007   enlarged thyroid with goiter  . LOBECTOMY  04/1999   right lung  . THYROIDECTOMY    . TUBAL LIGATION  1975    There were no vitals filed for this visit.  Subjective Assessment - 03/02/18 0951    Subjective  Patient reports that over the past 3 weeks she has not been feeling well. She reports she has been feeling better (physically) but she feels as though she is having increased pain with increased movement at the L buttock, and in the front of the shoulder. Patient reports she is having no pain at rest with 4/10 pain in the shoulder with movement and reports 6/10 pain in R  buttock with prolonged sitting. Patient reports her back feels great which she is happy with. patient reports min compliance with HEP. Patient reports she would like to hold PT until after Christmas    Pertinent History   Patient is 77 yo female that complains of acute on chronic posterior hip/LBP. Stated that most of her pain is in her posterior hips, but recently has progressed to include midline of low back around lumbar spine. Patient's main complaint is R shoulder pain and feels that her shoulder is her most limiting factor, PT has informed pt to obtain an order from her physician to include the shoulder moving forward. PMH significant and involved, including hx of thyroid, uterine, and lung cancer, COPD, lupus, IBS, and chronic kidney disease, L eye blindness. Patient complains of RUE pain that  started several years ago after pushing/pulling heavy furniture, has had several shoulder injections in the past. Functional activities such as pushing/pulling/lifting/reaching aggravate her pain, rest relieves.     Limitations  Sitting;Walking;House hold activities;Lifting    How long can you sit comfortably?  less than 2 hrs    How long can you stand comfortably?  NA    How long can you walk comfortably?  NA    Diagnostic tests  MRI: Mildly progressive disc bulging at L4-5 and mildly progressive facet arthrosis at L5-S1    Patient Stated Goals  main complaint is pain with sitting, new back pain with repositioning in bed    Pain Onset  More than a month ago    Pain Onset  More than a month ago               Therapeutic exercises: - Patient reports she is "not sure if she is doing her exercises correctly" and that she is confused as to why she feels better after therapy and feels as though pain returns. PT educated patient on compliance with HEP and carry over to postural correction. Patient remarks that she feels as though her posture is very poor and she is worried she is not correcting it well.   -    Review of bridge exercise x10 with good carry over and proper form; education for carry over into STS transfer -    Review of low row with patient reporting pain in shoulders. Following demo and cuing for scapular retraction > shoulder ext, prevention of shoulder hiking, and inc resistance to GTB; patient is able to complete x10 with GTB with no shoulder pain and proper form - Review of doorway pec stretch, changing UE position to close to 90/90 which patient reports she "feels" more with relief after 60sec hold - Review of seated stretch for deep hip ER, which patient reports causes pain in her shoulder, changed to supine which patient reports she can complete with proper "stretch" and no shoulder pain for 60sec hold - PT printed HEP for less confusion and changed wording to remind patient  of proper form with therex. Spent extensive time educating patient on posture for carry over, which patient demonstrates and verbalizes understanding. PT educated patient on purpose of stretching/strengthening to restore proper postural length/tension relationship.                                           Manual therapy: -STM and intermittent trigger pointing toRglute med/piriformis, and R bicep, pec minor, and ant deltoid, with decreased muscle tension  following - GHJ AP mob grade I-II 30sec bouts 6 bout                 PT Education - 03/02/18 1422    Education Details  HEP review, postural education, exercise technique/form    Person(s) Educated  Patient    Methods  Explanation;Verbal cues    Comprehension  Verbalized understanding;Verbal cues required       PT Short Term Goals - 01/13/18 1717      PT SHORT TERM GOAL #1   Title  Patient will be adherent to initial HEP at least 3x a week to improve functional strength and balance for better safety at home.    Baseline  administered today    Time  4    Period  Weeks    Status  New    Target Date  02/10/18        PT Long Term Goals - 03/02/18 0956      PT LONG TERM GOAL #1   Title  Patient will increase BLE gross strength to 4+/5 as to improve functional strength for independent gait, increased standing tolerance and increased ADL ability.    Baseline  03/02/18 no change to date d/t decreased consistency d/t illness    Time  8    Period  Weeks    Status  On-going      PT LONG TERM GOAL #2   Title  Patient will tolerate sitting unsupported demonstrating erect sitting posture with minimal thoracic kyphosis for 15+ minutes with maximum of 3/10 buttock pain to demonstrate improved back extensor strength and improved sitting tolerance.     Baseline  12/10 able to sit for increased time with no back pain, buttock pain 6/10    Time  8    Period  Weeks    Status  Revised      PT LONG TERM GOAL #3   Title   Patient will demonstrate at least 10 point improvement in FOTO score indicating improvement in ability to perform functional activities    Baseline  03/02/18 44    Time  8    Period  Weeks    Status  On-going      PT LONG TERM GOAL #4   Title  Patient will demonstrate bilateral UE MMT grade of at least 4+/5 to improve ability to perform functional activities    Baseline  03/02/18 no change to date d/t decreased consistency d/t illness    Time  8    Period  Weeks    Status  On-going      PT LONG TERM GOAL #5   Title  Patient will report RUE pain of 3/10 or less with functional activities such as lifting, reaching, dressing, etc to demonstrate improved tolerance to activities.    Baseline  03/02/18 reports 4/10 pain with ADLs     Time  8    Period  Weeks    Status  On-going            Plan - 03/02/18 1425    Clinical Impression Statement  Patient returns to PT after 3 week absence d/t illness. PT re-assessed goals for medicare compliance, where patient has made minimal progress d/t non-compliance d/t illness. Patient is reporting no LBP, with pain localized to R glute max/med/hip ER and R ant shoulder musculature. Most of patients pain seems to be muscle tension that is relieved through manual techniques. PT and patient reviewed HEP to encourage compliance and ensure  carry over at home, which patient is able to complete correctly following demo and cuing. PT educated patient on therex purpose for carry over into posture and ADLs for increased time of pain relief- patient verbalized understanding of. Patient will continue to benefit from skilled PT services to address remainind deficits.     Rehab Potential  Good    Clinical Impairments Affecting Rehab Potential  + motivation, symptom duration -, comorbidities    PT Frequency  2x / week    PT Duration  8 weeks    PT Treatment/Interventions  Electrical Stimulation;Therapeutic activities;Joint Manipulations;Spinal  Manipulations;Taping;Vestibular;Passive range of motion;Dry needling;Patient/family education;Manual techniques;Therapeutic exercise;Balance training;Neuromuscular re-education;Stair training;Functional mobility training;Cryotherapy;Canalith Repostioning;Moist Heat;Gait training    PT Next Visit Plan  Possible TDN to b/l piriformis/deep hip ER, progress back exercsies, STM to hips and R bicep, add scapular strengthening    PT Home Exercise Plan  see patient instruction section: piriformis stretch, LTR, prone lying, standing extension, scapular retraction, ER with band Access Code: [email or web address] add prone press ups and standing extensions    Consulted and Agree with Plan of Care  Patient       Patient will benefit from skilled therapeutic intervention in order to improve the following deficits and impairments:  Decreased endurance, Decreased mobility, Difficulty walking, Increased muscle spasms, Improper body mechanics, Decreased range of motion, Cardiopulmonary status limiting activity, Decreased activity tolerance, Decreased strength, Pain, Postural dysfunction  Visit Diagnosis: Chronic bilateral low back pain with bilateral sciatica     Problem List Patient Active Problem List   Diagnosis Date Noted  . Anxiety 06/12/2017  . Rheumatoid factor positive 02/26/2017  . Brachial radiculitis 12/25/2016  . Closed traumatic dislocation of cervical vertebra 12/25/2016  . Weakness of limb 12/25/2016  . Insomnia 02/08/2015  . Carotid stenosis 02/08/2015  . Solitary pulmonary nodule 03/31/2014  . Systemic lupus ([place]) 12/15/2013  . Near syncope 05/03/2013  . Vertigo 08/09/2012  . History of lung cancer 07/14/2012  . Interstitial nephritis, acute 07/14/2012  . History of thyroid cancer 06/30/2012  . Livedo reticularis 06/11/2012  . Antiphospholipid antibody positive 06/11/2012  . CRVO (central retinal vein occlusion) 05/25/2012  . Rash 04/06/2012  . Syncope 09/29/2011  . Stage III chronic kidney  disease ([place]) 10/09/2010  . HYPERALDOSTERONISM UNSPECIFIED 01/12/2009  . HYPERPLASIA, ADRENAL 01/12/2009  . HYPERTENSION, PULMONARY 01/04/2009  . COPD (chronic obstructive pulmonary disease) ([place]) 01/04/2009  . Blindness of left eye 12/12/2008  . IBS 07/12/2007  . Hypercholesterolemia 04/27/2007  . INCONTINENCE, URGE 04/23/2007  . Postsurgical hypothyroidism 06/22/2006  . Essential hypertension 06/22/2006  . GERD 06/22/2006  . Osteopenia 06/22/2006  . UTERINE CANCER, HX OF 06/22/2006  . DIVERTICULITIS, HX OF 06/22/2006   Shelton Silvas PT, DPT Shel[place] Silvas 03/02/2018, 2:32 PM  [facility] [address], [place], [address] Phone: [phone]   Fax:  [phone]  Name: TRAMAINE SNELL MRN: [id number] Date of Birth: 12/03/1940

## 2018-03-03 ENCOUNTER — Encounter: Payer: Medicare PPO | Admitting: Physical Therapy

## 2018-03-04 ENCOUNTER — Ambulatory Visit: Payer: Medicare PPO | Admitting: Physical Therapy

## 2018-03-08 ENCOUNTER — Encounter: Payer: Medicare PPO | Admitting: Physical Therapy

## 2018-03-10 ENCOUNTER — Encounter: Payer: Medicare PPO | Admitting: Physical Therapy

## 2018-03-15 ENCOUNTER — Telehealth: Payer: Self-pay | Admitting: Nurse Practitioner

## 2018-03-15 NOTE — Telephone Encounter (Signed)
Attempted to contact the pt to notify her that she needs to contact her pharmacy, because refills are already available.

## 2018-03-15 NOTE — Telephone Encounter (Signed)
Pt needs synthrroid sent to [facility] mail order.  Her call back  Is [phone]

## 2018-04-28 DIAGNOSIS — D631 Anemia in chronic kidney disease: Secondary | ICD-10-CM | POA: Diagnosis not present

## 2018-04-28 DIAGNOSIS — N183 Chronic kidney disease, stage 3 (moderate): Secondary | ICD-10-CM | POA: Diagnosis not present

## 2018-04-28 DIAGNOSIS — M329 Systemic lupus erythematosus, unspecified: Secondary | ICD-10-CM | POA: Diagnosis not present

## 2018-04-28 DIAGNOSIS — N119 Chronic tubulo-interstitial nephritis, unspecified: Secondary | ICD-10-CM | POA: Diagnosis not present

## 2018-04-28 DIAGNOSIS — I10 Essential (primary) hypertension: Secondary | ICD-10-CM | POA: Diagnosis not present

## 2018-05-03 ENCOUNTER — Other Ambulatory Visit: Payer: Self-pay | Admitting: Nurse Practitioner

## 2018-05-03 ENCOUNTER — Telehealth: Payer: Self-pay | Admitting: Family Medicine

## 2018-05-03 DIAGNOSIS — J439 Emphysema, unspecified: Secondary | ICD-10-CM

## 2018-05-03 MED ORDER — ALBUTEROL SULFATE HFA 108 (90 BASE) MCG/ACT IN AERS: 1.0000 | INHALATION_SPRAY | Freq: Four times a day (QID) | RESPIRATORY_TRACT | 0 refills | Status: DC | PRN

## 2018-05-03 NOTE — Progress Notes (Unsigned)
Received message from Dr. Otilio Miu that patient has requested albuterol refill over the weekend.  Patient with known COPD.  Rarely needs use of albuterol.  Recommend patient come for evaluation in 1-2 days if symptoms worsen.  Refill of albuterol placed.

## 2018-05-03 NOTE — Telephone Encounter (Signed)
Pt needs a refill on albuterol sent to [facility]

## 2018-05-14 DIAGNOSIS — L932 Other local lupus erythematosus: Secondary | ICD-10-CM | POA: Diagnosis not present

## 2018-05-26 DIAGNOSIS — R809 Proteinuria, unspecified: Secondary | ICD-10-CM | POA: Diagnosis not present

## 2018-05-26 DIAGNOSIS — N183 Chronic kidney disease, stage 3 (moderate): Secondary | ICD-10-CM | POA: Diagnosis not present

## 2018-05-26 DIAGNOSIS — I10 Essential (primary) hypertension: Secondary | ICD-10-CM | POA: Diagnosis not present

## 2018-06-01 ENCOUNTER — Other Ambulatory Visit: Payer: Self-pay | Admitting: Nurse Practitioner

## 2018-06-01 DIAGNOSIS — I10 Essential (primary) hypertension: Secondary | ICD-10-CM

## 2018-06-01 MED ORDER — NEBIVOLOL HCL 5 MG PO TABS: 5.0000 mg | ORAL_TABLET | Freq: Every day | ORAL | 0 refills | Status: DC

## 2018-06-01 NOTE — Telephone Encounter (Signed)
Pt called requesting refill on Bystploic 5 mg [facility] mail order

## 2018-06-04 ENCOUNTER — Encounter: Payer: Self-pay | Admitting: Nurse Practitioner

## 2018-06-04 ENCOUNTER — Ambulatory Visit: Payer: Medicare PPO | Admitting: Nurse Practitioner

## 2018-06-04 ENCOUNTER — Other Ambulatory Visit: Payer: Self-pay

## 2018-06-04 VITALS — BP 130/57 | HR 60 | Temp 98.7°F | Wt 134.0 lb

## 2018-06-04 DIAGNOSIS — J029 Acute pharyngitis, unspecified: Secondary | ICD-10-CM | POA: Diagnosis not present

## 2018-06-04 DIAGNOSIS — K219 Gastro-esophageal reflux disease without esophagitis: Secondary | ICD-10-CM | POA: Diagnosis not present

## 2018-06-04 MED ORDER — LANSOPRAZOLE 15 MG PO CPDR: 15.0000 mg | DELAYED_RELEASE_CAPSULE | Freq: Every day | ORAL | 4 refills | Status: DC

## 2018-06-04 NOTE — Progress Notes (Signed)
Subjective:    [name], female    DOB: 06-21-1940, 78 y.o.   MRN: [id number]  Margaret Black is a 78 y.o. female presenting on 06/04/2018 for Cough (Patient c/o persistent cough X 1 month. She reports that it is non productive. Denies fever. She has taken OTC allergy meds with no relief. )  HPI Cough Persistent for 1 month.  Had a very small tinge of blood with gargle, but otherwise no production.  Dr. Holley Raring nephrologist he listened to his lungs without any problems.  Esophageal bronchitis - was suggested as possible diagnosis by Dr. Holley Raring.  Patient presents today for eval and recommendations for treatment. - Patient has had no increased shortness of breath with COPD history.  - Patient notes no increased sinus congestion more than usual.  Has mild chronic rhinitis managed off medications. - Patient has had GERD diagnosis in past, but not recently taking PPI as previously prescribed.  Denies heartburn or nocturnal reflux.  Social History   Tobacco Use  . Smoking status: Former Smoker    Types: Cigarettes    Last attempt to quit: 07/04/1991    Years since quitting: 26.9  . Smokeless tobacco: Never Used  . Tobacco comment: Quit in early 1980's.  Substance Use Topics  . Alcohol use: No  . Drug use: No    Review of Systems Per HPI unless specifically indicated above     Objective:    BP (!) 130/57 (BP Location: Left Arm, Patient Position: Sitting, Cuff Size: Normal)   Pulse 60   Temp 98.7 F (37.1 C)   Wt 134 lb (60.8 kg)   BMI 22.30 kg/m   Wt Readings from Last 3 Encounters:  06/04/18 134 lb (60.8 kg)  02/03/18 133 lb 6.4 oz (60.5 kg)  12/15/17 130 lb 3.2 oz (59.1 kg)    Physical Exam Vitals signs reviewed.  Constitutional:      General: She is not in acute distress.    Appearance: She is well-developed.  HENT:     Head: Normocephalic and atraumatic.     Mouth/Throat:     Lips: Pink.     Mouth: Mucous membranes are moist.     Pharynx: Posterior  oropharyngeal erythema (mildly injected with erythema of mucous membranes) present. No pharyngeal swelling, oropharyngeal exudate or uvula swelling.     Tonsils: No tonsillar exudate. 0 on the right. 0 on the left.  Cardiovascular:     Rate and Rhythm: Normal rate and regular rhythm.     Pulses:          Radial pulses are 2+ on the right side and 2+ on the left side.       Posterior tibial pulses are 1+ on the right side and 1+ on the left side.     Heart sounds: Normal heart sounds, S1 normal and S2 normal.  Pulmonary:     Effort: Pulmonary effort is normal. No respiratory distress.     Breath sounds: Normal breath sounds and air entry.  Abdominal:     General: Bowel sounds are normal. There is no distension.     Palpations: Abdomen is soft.     Tenderness: There is abdominal tenderness in the epigastric area.     Hernia: No hernia is present.  Musculoskeletal:     Right lower leg: No edema.     Left lower leg: No edema.  Skin:    General: Skin is warm and dry.  Capillary Refill: Capillary refill takes less than 2 seconds.  Neurological:     General: No focal deficit present.     Mental Status: She is alert and oriented to person, place, and time. Mental status is at baseline.  Psychiatric:        Attention and Perception: Attention normal.        Mood and Affect: Mood and affect normal.        Behavior: Behavior normal. Behavior is cooperative.        Thought Content: Thought content normal.        Judgment: Judgment normal.    Results for orders placed or performed in visit on 02/03/18  Urine Culture  Result Value Ref Range   MICRO NUMBER: [id number]    SPECIMEN QUALITY: ADEQUATE    Sample Source URINE    STATUS: FINAL    ISOLATE 1: Escherichia coli (A)       Susceptibility   Escherichia coli - URINE CULTURE, REFLEX    AMOX/CLAVULANIC 8 Sensitive     AMPICILLIN >=32 Resistant     AMPICILLIN/SULBACTAM 16 Intermediate     CEFAZOLIN* <=4 Not Reportable      * For  infections other than uncomplicated UTIcaused by E. coli, K. pneumoniae or P. mirabilis:Cefazolin is resistant if MIC > or = 8 mcg/mL.(Distinguishing susceptible versus intermediatefor isolates with MIC < or = 4 mcg/mL requiresadditional testing.)For uncomplicated UTI caused by E. coli,K. pneumoniae or P. mirabilis: Cefazolin issusceptible if MIC <32 mcg/mL and predictssusceptible to the oral agents cefaclor, cefdinir,cefpodoxime, cefprozil, cefuroxime, cephalexinand loracarbef.    CEFEPIME <=1 Sensitive     CEFTRIAXONE <=1 Sensitive     CIPROFLOXACIN <=0.25 Sensitive     LEVOFLOXACIN <=0.12 Sensitive     ERTAPENEM <=0.5 Sensitive     GENTAMICIN <=1 Sensitive     IMIPENEM <=0.25 Sensitive     NITROFURANTOIN <=16 Sensitive     PIP/TAZO <=4 Sensitive     TOBRAMYCIN <=1 Sensitive     TRIMETH/SULFA* <=20 Sensitive      * For infections other than uncomplicated UTIcaused by E. coli, K. pneumoniae or P. mirabilis:Cefazolin is resistant if MIC > or = 8 mcg/mL.(Distinguishing susceptible versus intermediatefor isolates with MIC < or = 4 mcg/mL requiresadditional testing.)For uncomplicated UTI caused by E. coli,K. pneumoniae or P. mirabilis: Cefazolin issusceptible if MIC <32 mcg/mL and predictssusceptible to the oral agents cefaclor, cefdinir,cefpodoxime, cefprozil, cefuroxime, cephalexinand loracarbef.Legend:S = Susceptible  I = IntermediateR = Resistant  NS = Not susceptible* = Not tested  NR = Not reported**NN = See antimicrobic comments  TSH + free T4  Result Value Ref Range   TSH W/REFLEX TO FT4 1.64 0.40 - 4.50 mIU/L  POCT Urinalysis Dipstick  Result Value Ref Range   Color, UA amber    Clarity, UA clear    Glucose, UA Negative Negative   Bilirubin, UA negative    Ketones, UA negative    Spec Grav, UA 1.010 1.010 - 1.025   Blood, UA large    pH, UA 5.0 5.0 - 8.0   Protein, UA Positive (A) Negative   Urobilinogen, UA 0.2 0.2 or 1.0 E.U./dL   Nitrite, UA negative    Leukocytes, UA Large (3+)  (A) Negative   Appearance     Odor foul       Assessment & Plan:   Problem List Items Addressed This Visit      Digestive   GERD - Primary   Relevant Medications   lansoprazole (PREVACID) 15  MG capsule    Other Visit Diagnoses    Reflux pharyngitis        Currently uncontrolled with reflux pharyngitis.  Likely silent LES reflux contributing.  Previously controlled on lansoprazole 15 mg once daily.  No current evidence of COPD exacerbation.  Plan: 1. Resume lansoprazole 15 mg once daily. Side effects discussed. 2. Avoid diet triggers. Reviewed need to seek care if globus sensation, difficulty swallowing, s/sx of GI bleed. 3. Follow up as needed and in 2 weeks.  If persistent, may need prednisone to help with COPD control.    Meds ordered this encounter  Medications  . lansoprazole (PREVACID) 15 MG capsule    Sig: Take 1 capsule (15 mg total) by mouth daily at 12 noon.    Dispense:  90 capsule    Refill:  4    Order Specific Question:   Supervising Provider    Answer:   Olin Hauser [[id number]]   Follow up plan: Return in about 5 weeks (around 07/09/2018), or if symptoms worsen or fail to improve, for anxiety, COPD, GERD.  Cassell Smiles, DNP, AGPCNP-BC Adult Gerontology Primary Care Nurse Practitioner [facility] 06/04/2018, 11:13 AM

## 2018-06-04 NOTE — Patient Instructions (Addendum)
Margaret Black,   Thank you for coming in to clinic today.  1. Your cough is likely esophageal bronchitis.   - RESTART lansoprazole (Prevacid) 15 mg one capusle daily about 30 minutes before or with breakfast.  2. If not improving in 2 weeks, call clinic - we will consider prednisone.  Please schedule a follow-up appointment with Cassell Smiles, AGNP. Return in about 5 weeks (around 07/09/2018), or if symptoms worsen or fail to improve, for anxiety, COPD, GERD.  If you have any other questions or concerns, please feel free to call the clinic or send a message through [place]. You may also schedule an earlier appointment if necessary.  You will receive a survey after today's visit either digitally by e-mail or paper by [employer]. Your experiences and feedback matter to [place].  Please respond so we know how we are doing as we provide care for you.   Cassell Smiles, DNP, AGNP-BC Adult Gerontology Nurse Practitioner [place]

## 2018-06-10 ENCOUNTER — Encounter: Payer: Self-pay | Admitting: Nurse Practitioner

## 2018-06-14 ENCOUNTER — Other Ambulatory Visit: Payer: Self-pay

## 2018-06-14 ENCOUNTER — Other Ambulatory Visit: Payer: Self-pay | Admitting: Nurse Practitioner

## 2018-06-14 DIAGNOSIS — E269 Hyperaldosteronism, unspecified: Secondary | ICD-10-CM

## 2018-06-14 DIAGNOSIS — F5104 Psychophysiologic insomnia: Secondary | ICD-10-CM

## 2018-06-14 MED ORDER — SPIRONOLACTONE 25 MG PO TABS: 25.0000 mg | ORAL_TABLET | Freq: Two times a day (BID) | ORAL | 2 refills | Status: DC

## 2018-06-14 MED ORDER — LORAZEPAM 0.5 MG PO TABS: 0.5000 mg | ORAL_TABLET | Freq: Every evening | ORAL | 4 refills | Status: DC | PRN

## 2018-06-14 NOTE — Telephone Encounter (Signed)
Pt called requesting refill on  Aldactone 25 mg  Lorazepam 0.5 mg  Called into  Mail order  Pt  Call back  # is  [phone].

## 2018-07-09 ENCOUNTER — Encounter: Payer: Self-pay | Admitting: Nurse Practitioner

## 2018-07-09 ENCOUNTER — Ambulatory Visit (INDEPENDENT_AMBULATORY_CARE_PROVIDER_SITE_OTHER): Payer: Medicare PPO | Admitting: Nurse Practitioner

## 2018-07-09 ENCOUNTER — Other Ambulatory Visit: Payer: Self-pay

## 2018-07-09 DIAGNOSIS — E269 Hyperaldosteronism, unspecified: Secondary | ICD-10-CM

## 2018-07-09 DIAGNOSIS — K219 Gastro-esophageal reflux disease without esophagitis: Secondary | ICD-10-CM | POA: Diagnosis not present

## 2018-07-09 DIAGNOSIS — E890 Postprocedural hypothyroidism: Secondary | ICD-10-CM

## 2018-07-09 DIAGNOSIS — I10 Essential (primary) hypertension: Secondary | ICD-10-CM | POA: Diagnosis not present

## 2018-07-09 DIAGNOSIS — F5104 Psychophysiologic insomnia: Secondary | ICD-10-CM

## 2018-07-09 MED ORDER — LORAZEPAM 0.5 MG PO TABS: 0.5000 mg | ORAL_TABLET | Freq: Every evening | ORAL | 5 refills | Status: DC | PRN

## 2018-07-09 MED ORDER — NIFEDIPINE ER OSMOTIC RELEASE 60 MG PO TB24: ORAL_TABLET | ORAL | 2 refills | Status: DC

## 2018-07-09 MED ORDER — NEBIVOLOL HCL 5 MG PO TABS: 5.0000 mg | ORAL_TABLET | Freq: Every day | ORAL | 2 refills | Status: DC

## 2018-07-09 MED ORDER — SYNTHROID 88 MCG PO TABS: 88.0000 ug | ORAL_TABLET | Freq: Every day | ORAL | 2 refills | Status: DC

## 2018-07-09 MED ORDER — LANSOPRAZOLE 15 MG PO CPDR: 15.0000 mg | DELAYED_RELEASE_CAPSULE | Freq: Every day | ORAL | 2 refills | Status: DC

## 2018-07-09 MED ORDER — SPIRONOLACTONE 25 MG PO TABS: 25.0000 mg | ORAL_TABLET | Freq: Two times a day (BID) | ORAL | 2 refills | Status: DC

## 2018-07-09 NOTE — Progress Notes (Signed)
Telemedicine Encounter: Disclosed to patient at start of encounter that we will provide appropriate telemedicine services.  Patient consents to be treated via phone prior to discussion. - Patient is at her home and is accessed via telephone. - Services are provided by Cassell Smiles from [facility].  Subjective:    [name], female    DOB: 06/12/1940, 78 y.o.   MRN: [id number]  Margaret Black is a 78 y.o. female presenting on 07/09/2018 for Anxiety and Allergies (ITCHY WATERY EYES, MILD COUGH)  HPI Hypothyroidism  Patient continues Synthroid 88 mcg  - Skin and hair continues to be very dry - not eating her regular diet as well.  - Patient is also having some mild palpitations - Patient is becoming overtired, only 1-2 lbs with anxiety  Anxiety Patient continues only at bedtime.  Anxiety is mildly increased, feels lack of family/social support system.   Is taking long walk in mornings on a walking track at park.  Daughter is working with Covid+ patients as Therapist, sports.  Patient notes she feels helpless to the situation  Allergies  Very mild, but notes she is controlling symptoms with carpenter's mask for pollen blowing days.  Is avoiding OTC meds.  No complication. - Lupus rash has reappeared 2/2 increased stress.  She is using creams to help control that are provided by her dermatologist.  Hypertension with hyperaldosteronism BP reading about 3-4 weeks ago and BP was within range 115/65 approx. Continues management primarily with Dr. Holley Raring at [facility]. Last visit approx 1 month ago.   - Patient continues medications and tolerates well without side effects. - Pt denies headache, lightheadedness, dizziness, changes in vision, chest tightness/pressure, palpitations, leg swelling, sudden loss of speech or loss of consciousness.   COPD Used albuterol only once with increased allergy response. Has not had any continued use of prior maintenance inhaler.  Has no regular  shortness of breath or wheezing.  Continues to be former smoker.   Social History   Tobacco Use  . Smoking status: Former Smoker    Types: Cigarettes    Last attempt to quit: 07/04/1991    Years since quitting: 27.0  . Smokeless tobacco: Never Used  . Tobacco comment: Quit in early 1980's.  Substance Use Topics  . Alcohol use: No  . Drug use: No   Review of Systems Per HPI unless specifically indicated above    Objective:    There were no vitals taken for this visit.  Wt Readings from Last 3 Encounters:  06/04/18 134 lb (60.8 kg)  02/03/18 133 lb 6.4 oz (60.5 kg)  12/15/17 130 lb 3.2 oz (59.1 kg)    Physical Exam Patient remotely monitored.  Verbal communication appropriate.  Cognition normal.   Results for orders placed or performed in visit on 02/03/18  Urine Culture  Result Value Ref Range   MICRO NUMBER: [id number]    SPECIMEN QUALITY: ADEQUATE    Sample Source URINE    STATUS: FINAL    ISOLATE 1: Escherichia coli (A)       Susceptibility   Escherichia coli - URINE CULTURE, REFLEX    AMOX/CLAVULANIC 8 Sensitive     AMPICILLIN >=32 Resistant     AMPICILLIN/SULBACTAM 16 Intermediate     CEFAZOLIN* <=4 Not Reportable      * For infections other than uncomplicated UTIcaused by E. coli, K. pneumoniae or P. mirabilis:Cefazolin is resistant if MIC > or = 8 mcg/mL.(Distinguishing susceptible versus intermediatefor isolates with MIC <  or = 4 mcg/mL requiresadditional testing.)For uncomplicated UTI caused by E. coli,K. pneumoniae or P. mirabilis: Cefazolin issusceptible if MIC <32 mcg/mL and predictssusceptible to the oral agents cefaclor, cefdinir,cefpodoxime, cefprozil, cefuroxime, cephalexinand loracarbef.    CEFEPIME <=1 Sensitive     CEFTRIAXONE <=1 Sensitive     CIPROFLOXACIN <=0.25 Sensitive     LEVOFLOXACIN <=0.12 Sensitive     ERTAPENEM <=0.5 Sensitive     GENTAMICIN <=1 Sensitive     IMIPENEM <=0.25 Sensitive     NITROFURANTOIN <=16 Sensitive     PIP/TAZO <=4  Sensitive     TOBRAMYCIN <=1 Sensitive     TRIMETH/SULFA* <=20 Sensitive      * For infections other than uncomplicated UTIcaused by E. coli, K. pneumoniae or P. mirabilis:Cefazolin is resistant if MIC > or = 8 mcg/mL.(Distinguishing susceptible versus intermediatefor isolates with MIC < or = 4 mcg/mL requiresadditional testing.)For uncomplicated UTI caused by E. coli,K. pneumoniae or P. mirabilis: Cefazolin issusceptible if MIC <32 mcg/mL and predictssusceptible to the oral agents cefaclor, cefdinir,cefpodoxime, cefprozil, cefuroxime, cephalexinand loracarbef.Legend:S = Susceptible  I = IntermediateR = Resistant  NS = Not susceptible* = Not tested  NR = Not reported**NN = See antimicrobic comments  TSH + free T4  Result Value Ref Range   TSH W/REFLEX TO FT4 1.64 0.40 - 4.50 mIU/L  POCT Urinalysis Dipstick  Result Value Ref Range   Color, UA amber    Clarity, UA clear    Glucose, UA Negative Negative   Bilirubin, UA negative    Ketones, UA negative    Spec Grav, UA 1.010 1.010 - 1.025   Blood, UA large    pH, UA 5.0 5.0 - 8.0   Protein, UA Positive (A) Negative   Urobilinogen, UA 0.2 0.2 or 1.0 E.U./dL   Nitrite, UA negative    Leukocytes, UA Large (3+) (A) Negative   Appearance     Odor foul       Assessment & Plan:   Problem List Items Addressed This Visit      Cardiovascular and Mediastinum   Essential hypertension Stable today on remote evaluation with home BP readings at goal.  Medications tolerated without side effects.  Continue at current doses.  Refills provided. Followup 6 months.    Relevant Medications   nebivolol (BYSTOLIC) 5 MG tablet   NIFEdipine (NIFEDICAL XL) 60 MG 24 hr tablet   spironolactone (ALDACTONE) 25 MG tablet     Digestive   GERD Stable today on remote evaluation.  No signs and symptoms of GI bleeding noted. Medications tolerated without side effects.  Continue at current doses.  Refills provided. Followup 6 months.    Relevant Medications    lansoprazole (PREVACID) 15 MG capsule     Endocrine   Postsurgical hypothyroidism Stable today on remote evaluation.  Medications tolerated without side effects.  Continue at current doses.  Refills provided. Needs labs if TSH was not collected by Dr. Holley Raring.  Request for lab results has been submitted. Followup 6 months.    Relevant Medications   SYNTHROID 88 MCG tablet   nebivolol (BYSTOLIC) 5 MG tablet   HYPERALDOSTERONISM UNSPECIFIED Stable today on remote evaluation.  Medications tolerated without side effects.  Continue at current doses.  Refills provided. Followup 6 months. Continue regular nephrology follow-up.   Relevant Medications   spironolactone (ALDACTONE) 25 MG tablet     Other   Insomnia Stable today on remote evaluation.  Medications tolerated without side effects.  Continue at current doses.  Refills provided.  Followup 6 months.    Relevant Medications   LORazepam (ATIVAN) 0.5 MG tablet (Start on 07/11/2018)        Meds ordered this encounter  Medications  . SYNTHROID 88 MCG tablet    Sig: Take 1 tablet (88 mcg total) by mouth daily before breakfast.    Dispense:  90 tablet    Refill:  2  . LORazepam (ATIVAN) 0.5 MG tablet    Sig: Take 1 tablet (0.5 mg total) by mouth at bedtime as needed for anxiety or sleep.    Dispense:  30 tablet    Refill:  5    Order Specific Question:   Supervising Provider    Answer:   Olin Hauser [[id number]]  . lansoprazole (PREVACID) 15 MG capsule    Sig: Take 1 capsule (15 mg total) by mouth daily at 12 noon.    Dispense:  90 capsule    Refill:  2    Order Specific Question:   Supervising Provider    Answer:   Olin Hauser [[id number]]  . nebivolol (BYSTOLIC) 5 MG tablet    Sig: Take 1 tablet (5 mg total) by mouth daily.    Dispense:  90 tablet    Refill:  2    Order Specific Question:   Supervising Provider    Answer:   Olin Hauser [[id number]]  . NIFEdipine (NIFEDICAL XL) 60 MG 24 hr tablet    Sig: TAKE  1 TABLET EVERY EVENING    Dispense:  90 tablet    Refill:  2    Order Specific Question:   Supervising Provider    Answer:   Olin Hauser [[id number]]  . spironolactone (ALDACTONE) 25 MG tablet    Sig: Take 1 tablet (25 mg total) by mouth 2 (two) times daily.    Dispense:  180 tablet    Refill:  2    Order Specific Question:   Supervising Provider    Answer:   Olin Hauser [[id number]]    - Time spent in direct consultation with patient via telemedicine about above concerns: 14 minutes  Follow up plan: Return in about 6 months (around 01/08/2019) for hypertension, thyroid, anxiety.  Cassell Smiles, DNP, AGPCNP-BC Adult Gerontology Primary Care Nurse Practitioner [facility] 07/09/2018, 1:20 PM

## 2018-08-02 ENCOUNTER — Telehealth: Payer: Self-pay

## 2018-08-02 ENCOUNTER — Other Ambulatory Visit: Payer: Medicare PPO

## 2018-08-02 ENCOUNTER — Other Ambulatory Visit: Payer: Self-pay

## 2018-08-02 DIAGNOSIS — M3219 Other organ or system involvement in systemic lupus erythematosus: Secondary | ICD-10-CM

## 2018-08-02 DIAGNOSIS — E890 Postprocedural hypothyroidism: Secondary | ICD-10-CM | POA: Diagnosis not present

## 2018-08-02 DIAGNOSIS — N183 Chronic kidney disease, stage 3 unspecified: Secondary | ICD-10-CM

## 2018-08-02 NOTE — Telephone Encounter (Signed)
The pt was notified. She verbalize understanding, no questions or concerns. 

## 2018-08-02 NOTE — Telephone Encounter (Signed)
The pt called with concerning about a episode that happen x3 days ago where she experienced  profuse sweating, heart palpations and hot flashes that lasted about 30-45 minutes. The pt states after the hot flashes subsided she developed chills. She denied chest pain, numbness or tingling, fever, and body aches. She also denies SOB or cough. She feel like her symptoms are more related to her thyroid been out of range and is requesting blood work today and an office visit tomorrow. Please advise

## 2018-08-02 NOTE — Telephone Encounter (Signed)
Covering inbox for Margaret Black, AGPCNP-BC while she is out of office on maternity.  Briefly reviewed chart, looks like she was last seen by [name] nearly 1 month ago in mid April 2020, she was due for Thyroid labs at that time. [name] requested labs at that time, but I do not see that they were ever received.  She can come in today if she prefer for labs or tomorrow, I have placed Future Lab orders for her here.  She can schedule within 24-48 hours. Even if we don't have lab results back yet, we can discuss her symptoms and follow up once her result is ready with any dose adjustment of Thyroid.  I am concerned by her symptom and if any other significant episode occurs that does not resolve promptly < 30 min or is more severe or new symptoms, particularly chest pain or tightness, she should seek care more immediately at hospital ED.  [name], DO [facility] 08/02/2018, 8:45 AM

## 2018-08-03 ENCOUNTER — Other Ambulatory Visit: Payer: Self-pay

## 2018-08-03 ENCOUNTER — Ambulatory Visit (INDEPENDENT_AMBULATORY_CARE_PROVIDER_SITE_OTHER): Payer: Medicare PPO | Admitting: Family Medicine

## 2018-08-03 ENCOUNTER — Telehealth: Payer: Self-pay

## 2018-08-03 ENCOUNTER — Encounter: Payer: Self-pay | Admitting: Family Medicine

## 2018-08-03 VITALS — BP 137/57 | HR 60

## 2018-08-03 DIAGNOSIS — E269 Hyperaldosteronism, unspecified: Secondary | ICD-10-CM | POA: Diagnosis not present

## 2018-08-03 DIAGNOSIS — E890 Postprocedural hypothyroidism: Secondary | ICD-10-CM

## 2018-08-03 DIAGNOSIS — R232 Flushing: Secondary | ICD-10-CM

## 2018-08-03 LAB — CBC WITH DIFFERENTIAL/PLATELET
Absolute Monocytes: 559 cells/uL (ref 200–950)
Basophils Absolute: 83 cells/uL (ref 0–200)
Basophils Relative: 1.2 %
Eosinophils Absolute: 359 cells/uL (ref 15–500)
Eosinophils Relative: 5.2 %
HCT: 36.9 % (ref 35.0–45.0)
Hemoglobin: 12.3 g/dL (ref 11.7–15.5)
Lymphs Abs: 1125 cells/uL (ref 850–3900)
MCH: 31.8 pg (ref 27.0–33.0)
MCHC: 33.3 g/dL (ref 32.0–36.0)
MCV: 95.3 fL (ref 80.0–100.0)
MPV: 10.3 fL (ref 7.5–12.5)
Monocytes Relative: 8.1 %
Neutro Abs: 4775 cells/uL (ref 1500–7800)
Neutrophils Relative %: 69.2 %
Platelets: 323 10*3/uL (ref 140–400)
RBC: 3.87 10*6/uL (ref 3.80–5.10)
RDW: 12.3 % (ref 11.0–15.0)
Total Lymphocyte: 16.3 %
WBC: 6.9 10*3/uL (ref 3.8–10.8)

## 2018-08-03 LAB — COMPLETE METABOLIC PANEL WITH GFR
AG Ratio: 1.4 (calc) (ref 1.0–2.5)
ALT: 16 U/L (ref 6–29)
AST: 18 U/L (ref 10–35)
Albumin: 4.1 g/dL (ref 3.6–5.1)
Alkaline phosphatase (APISO): 87 U/L (ref 37–153)
BUN/Creatinine Ratio: 17 (calc) (ref 6–22)
BUN: 23 mg/dL (ref 7–25)
CO2: 27 mmol/L (ref 20–32)
Calcium: 10.2 mg/dL (ref 8.6–10.4)
Chloride: 105 mmol/L (ref 98–110)
Creat: 1.36 mg/dL — ABNORMAL HIGH (ref 0.60–0.93)
GFR, Est African American: 43 mL/min/{1.73_m2} — ABNORMAL LOW (ref >=60)
GFR, Est Non African American: 37 mL/min/{1.73_m2} — ABNORMAL LOW (ref >=60)
Globulin: 3 g/dL (calc) (ref 1.9–3.7)
Glucose, Bld: 101 mg/dL — ABNORMAL HIGH (ref 65–99)
Potassium: 5.3 mmol/L (ref 3.5–5.3)
Sodium: 142 mmol/L (ref 135–146)
Total Bilirubin: 0.8 mg/dL (ref 0.2–1.2)
Total Protein: 7.1 g/dL (ref 6.1–8.1)

## 2018-08-03 LAB — TSH: TSH: 1.92 mIU/L (ref 0.40–4.50)

## 2018-08-03 LAB — T4, FREE: Free T4: 1.6 ng/dL (ref 0.8–1.8)

## 2018-08-03 LAB — THYROGLOBULIN LEVEL: Thyroglobulin: <0.1 ng/mL — ABNORMAL LOW

## 2018-08-03 LAB — THYROGLOBULIN ANTIBODY: Thyroglobulin Ab: 1 IU/mL (ref <=1)

## 2018-08-03 NOTE — Patient Instructions (Addendum)
AVS info given to patient by phone. No mychart access.  She was asked to follow-up as planned with Dr Holley Raring Nephrology

## 2018-08-03 NOTE — Telephone Encounter (Signed)
The pt was seen in the office today for a office visit.

## 2018-08-03 NOTE — Progress Notes (Signed)
Virtual Visit via Telephone The purpose of this virtual visit is to provide medical care while limiting exposure to the novel coronavirus (COVID19) for both patient and office staff.  Consent was obtained for phone visit:  Yes.   Answered questions that patient had about telehealth interaction:  Yes.   I discussed the limitations, risks, security and privacy concerns of performing an evaluation and management service by telephone. I also discussed with the patient that there may be a patient responsible charge related to this service. The patient expressed understanding and agreed to proceed.  Patient Location: Home Provider Location: [facility] (Office)  PCP is Cassell Smiles, AGPCNP-BC - I am currently covering during her maternity leave.   ---------------------------------------------------------------------- Chief Complaint  Patient presents with  . Palpitations    episode that happen x 5  days ago where she experienced profuse sweating, heart palpations and hot flashes that lasted about 30-45 minutes. The pt states after the hot flashes subsided she developed chills. She denied chest pain, numbness or tingling, fever, and body aches. She also denies SOB or cough.     S: Reviewed CMA documentation. I have called patient and gathered additional HPI as follows:   Follow-up Hypothyroidism HTN with CKD - III / Acute episode of diaphoresis and palpitations - Last visit with PCP 07/09/18, for same problems discussed, recommended return for labs, see prior notes for background information. - Interval update with now resulted thyroid and chemistry labs, reviewed see below, had normal TSH and Free T4, and appropriate < 0.1 thyroglobulin, chemistry with persistent elevated Cr stable with CKD and normal electrolytes  - Today patient reports new concern 5 days ago with acute episode of sweating diaphoresis heart palpitations rapid heart beat and hot flash lasted >30 min, and then  gradually improving by today. Resolved sweating. Has improved her strength today. Still has occasional palpitations. Had issue with CKD-III followed by [facility] Dr Holley Raring History of primary hyperaldosteronism, she was not as familiar with this diagnosis when discussed - Continues on Synthroid 32mcg - She was asking if thyroid was off since had been while since last check Admits still has occasional palpitations which is normal for her   Denies any high risk travel to areas of current concern for COVID19. Denies any known or suspected exposure to person with or possibly with COVID19.  Denies any fevers, chills, sweats, body ache, cough, shortness of breath, sinus pain or pressure, headache, abdominal pain, diarrhea  Past Medical History:  Diagnosis Date  . Anxiety    Related to HTN and vision loss  . Central retinal vein occlusion of left eye    legally blind in Left eye  . 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 Collagen vascular disease ([place])    uncharacterized.  has seen rheum.  positive RF, positive serologies for Sjogrens.  C-ANCA/P-ANCA neg, ANA  neg, anti-dsDNA neg, anti-SCL70 neg, anti-centromere Ab neg  . COPD (chronic obstructive pulmonary disease) ([place])    quit smoking 1993  . Diverticulosis   . Dysfunction of eustachian tube   . Fatigue   . GERD (gastroesophageal reflux disease)   . Goiter, unspecified   . HLD (hyperlipidemia)   . HTN (hypertension)    difficult to control, hypertensive urgency hospitalizations, normal urine/plasma metanephrines and catecholamines, renal dopplers without stenosis  . Hyperaldosteronism    serum aldo/renin ration elevated (aldo 23.7, renin <0.15), CT abd 2010 with normal adrenals, dedicated adrenal CT without  adrenal adenoma,   . Hypothyroid   . Insomnia    Related to HTN and vision loss  . Irritable bowel syndrome   . Leiomyosarcoma of uterus ([place]) 09/22/2014  . Lung cancer ([place])    s/p  L lobectomy 2001  . Lupus ([place])   . Osteopenia   . Pulmonary hypertension ([place])    echo [facility] 12/2008 with EF >5%, grade II diastolic dysfunction, RSVP >24mmHg, ACE level normal.  f/u echo at [place] - EF 55-60%, mild MR, mild LAE, PASP 25 + RA (no pulm HTN)  . Urge incontinence   . Uterine cancer [facility])    s/p hysterectomy 2001   Social History   Tobacco Use  . Smoking status: Former Smoker    Types: Cigarettes    Last attempt to quit: 07/04/1991    Years since quitting: 27.1  . Smokeless tobacco: Never Used  . Tobacco comment: Quit in early 1980's.  Substance Use Topics  . Alcohol use: No  . Drug use: No    Current Outpatient Medications:  .  albuterol (PROVENTIL HFA;VENTOLIN HFA) 108 (90 Base) MCG/ACT inhaler, Inhale 1-2 puffs into the lungs every 6 (six) hours as needed for shortness of breath., Disp: 1 Inhaler, Rfl: 0 .  amLODipine (NORVASC) 2.5 MG tablet, amlodipine 2.5 mg tablet, Disp: , Rfl:  .  aspirin 81 MG tablet, Take 81 mg by mouth daily., Disp: , Rfl:  .  Aspirin-Calcium Carbonate 81-777 MG TABS, Take by mouth., Disp: , Rfl:  .  calcium carbonate (TUMS - DOSED IN MG ELEMENTAL CALCIUM) 500 MG chewable tablet, Chew 2 tablets by mouth daily., Disp: , Rfl:  .  CALCIUM-VITAMIN D PO, Take by mouth daily., Disp: , Rfl:  .  clobetasol ointment (TEMOVATE) 0.05 %, Apply a fingertip amount to your vagina nightly for 6-12 weeks., Disp: 45 g, Rfl: 1 .  enalapril (VASOTEC) 2.5 MG tablet, , Disp: , Rfl:  .  fluticasone (CUTIVATE) 0.05 % cream, , Disp: , Rfl:  .  LORazepam (ATIVAN) 0.5 MG tablet, Take 1 tablet (0.5 mg total) by mouth at bedtime as needed for anxiety or sleep., Disp: 30 tablet, Rfl: 5 .  Multiple Vitamin (MULTIVITAMIN) tablet, Take 1 tablet by mouth daily., Disp: , Rfl:  .  nebivolol (BYSTOLIC) 5 MG tablet, Take 1 tablet (5 mg total) by mouth daily., Disp: 90 tablet, Rfl: 2 .  NIFEdipine (NIFEDICAL XL) 60 MG 24 hr tablet, TAKE 1 TABLET EVERY EVENING, Disp: 90 tablet,  Rfl: 2 .  spironolactone (ALDACTONE) 25 MG tablet, Take 1 tablet (25 mg total) by mouth 2 (two) times daily., Disp: 180 tablet, Rfl: 2 .  SYNTHROID 88 MCG tablet, Take 1 tablet (88 mcg total) by mouth daily before breakfast., Disp: 90 tablet, Rfl: 2 .  vitamin C (ASCORBIC ACID) 500 MG tablet, Take 500 mg by mouth daily., Disp: , Rfl:   Depression screen [facility] 2/9 07/09/2018 12/15/2017 07/01/2016  Decreased Interest 0 0 0  Down, Depressed, Hopeless 0 0 0  PHQ - 2 Score 0 0 0  Altered sleeping 0 - -  Tired, decreased energy 0 - -  Change in appetite 0 - -  Feeling bad or failure about yourself  0 - -  Trouble concentrating 0 - -  Moving slowly or fidgety/restless 0 - -  Suicidal thoughts 0 - -  PHQ-9 Score 0 - -  Difficult doing work/chores Not difficult at all - -  Some recent data might be hidden  GAD 7 : Generalized Anxiety Score 07/09/2018 07/01/2016  Nervous, Anxious, on Edge 1 0  Control/stop worrying 0 1  Worry too much - different things 0 0  Trouble relaxing 0 0  Restless 0 0  Easily annoyed or irritable 0 1  Afraid - awful might happen 0 0  Total GAD 7 Score 1 2  Anxiety Difficulty - Not difficult at all    -------------------------------------------------------------------------- O: No physical exam performed due to remote telephone encounter.  Lab results reviewed.  Recent Results (from the past 2160 hour(s))  Thyroglobulin antibody     Status: None   Collection Time: 08/02/18  9:34 AM  Result Value Ref Range   Thyroglobulin Ab 1 < or = 1 IU/mL  CBC with Differential/Platelet     Status: None   Collection Time: 08/02/18  9:34 AM  Result Value Ref Range   WBC 6.9 3.8 - 10.8 Thousand/uL   RBC 3.87 3.80 - 5.10 Million/uL   Hemoglobin 12.3 11.7 - 15.5 g/dL   HCT 36.9 35.0 - 45.0 %   MCV 95.3 80.0 - 100.0 fL   MCH 31.8 27.0 - 33.0 pg   MCHC 33.3 32.0 - 36.0 g/dL   RDW 12.3 11.0 - 15.0 %   Platelets 323 140 - 400 Thousand/uL   MPV 10.3 7.5 - 12.5 fL   Neutro Abs  4,775 1,500 - 7,800 cells/uL   Lymphs Abs 1,125 850 - 3,900 cells/uL   Absolute Monocytes 559 200 - 950 cells/uL   Eosinophils Absolute 359 15 - 500 cells/uL   Basophils Absolute 83 0 - 200 cells/uL   Neutrophils Relative % 69.2 %   Total Lymphocyte 16.3 %   Monocytes Relative 8.1 %   Eosinophils Relative 5.2 %   Basophils Relative 1.2 %  Thyroglobulin Level     Status: Abnormal   Collection Time: 08/02/18  9:34 AM  Result Value Ref Range   Thyroglobulin <0.1 (L) ng/mL    Comment:       Reference Range:       Intact Thyroid   2.8-40.9       Athyrotic        <0.1 .       Note: Abnormal flagging is based       on the reference interval for        patients with intact thyroid. . . This test was performed using the Beckman Coulter  chemiluminescent method. Values obtained from different assay methods cannot be used interchangeably. Thyroglobulin levels, regardless of value, should not be interpreted as absolute evidence of the presence or absence of disease. .    Comment      Comment: . Thyroglobulin antibodies (TGAB) interfere with thyroglobulin (TG) assays; therefore, TGAB assay should always be performed in conjunction with a TG assay. . . For additional information, please refer to  http://education.questdiagnostics.com/faq/FAQ202  (This link is being provided for informational/ educational purposes only.) .   T4, free     Status: None   Collection Time: 08/02/18  9:34 AM  Result Value Ref Range   Free T4 1.6 0.8 - 1.8 ng/dL  TSH     Status: None   Collection Time: 08/02/18  9:34 AM  Result Value Ref Range   TSH 1.92 0.40 - 4.50 mIU/L  COMPLETE METABOLIC PANEL WITH GFR     Status: Abnormal   Collection Time: 08/02/18  9:34 AM  Result Value Ref Range   Glucose, Bld 101 (H) 65 - 99 mg/dL  Comment: .            Fasting reference interval . For someone without known diabetes, a glucose value between 100 and 125 mg/dL is consistent with prediabetes and  should be confirmed with a follow-up test. .    BUN 23 7 - 25 mg/dL   Creat 1.36 (H) 0.60 - 0.93 mg/dL    Comment: For patients >43 years of age, the reference limit for Creatinine is approximately 13% higher for people identified as African-American. .    GFR, Est Non African American 37 (L) > OR = 60 mL/min/1.2m2   GFR, Est African American 43 (L) > OR = 60 mL/min/1.10m2   BUN/Creatinine Ratio 17 6 - 22 (calc)   Sodium 142 135 - 146 mmol/L   Potassium 5.3 3.5 - 5.3 mmol/L   Chloride 105 98 - 110 mmol/L   CO2 27 20 - 32 mmol/L   Calcium 10.2 8.6 - 10.4 mg/dL   Total Protein 7.1 6.1 - 8.1 g/dL   Albumin 4.1 3.6 - 5.1 g/dL   Globulin 3.0 1.9 - 3.7 g/dL (calc)   AG Ratio 1.4 1.0 - 2.5 (calc)   Total Bilirubin 0.8 0.2 - 1.2 mg/dL   Alkaline phosphatase (APISO) 87 37 - 153 U/L   AST 18 10 - 35 U/L   ALT 16 6 - 29 U/L    -------------------------------------------------------------------------- A&P:  Problem List Items Addressed This Visit    HYPERALDOSTERONISM UNSPECIFIED   Postsurgical hypothyroidism - Primary    Other Visit Diagnoses    Hot flashes         Clinically acute episode constellation of hot flashes / palpitation sweating symptoms, has slowly improved - seems to be resolving. No clear exact etiology, suspect related to likely hormonal concern at this time w/ chronic medical problems, related to med side effect / primary hyperaldosteronism, hypothyroidism / anxiety as well.  Continue current course No change of medications today She should continue to follow-up with Nephrology at this time If ultimately persistent issue with palpitations or other related concerns, consider other cause such as anxiety or may warrant future cardiac work-up if new concerning cardiac symptoms chest pain or pressure or other exertional concerns.  No orders of the defined types were placed in this encounter.   Follow-up: - Return in 3 months as anticipated w/ PCP for routine  follow-up HTN CKD Thyroid HyperAldo  Patient verbalizes understanding with the above medical recommendations including the limitation of remote medical advice.  Specific follow-up and call-back criteria were given for patient to follow-up or seek medical care more urgently if needed.  - Time spent in direct consultation with patient on phone: 11 minutes  [name], [facility] 08/03/2018, 2:58 PM

## 2018-08-24 ENCOUNTER — Ambulatory Visit: Payer: Medicare PPO | Admitting: Family Medicine

## 2018-08-24 ENCOUNTER — Other Ambulatory Visit: Payer: Self-pay

## 2018-09-17 DIAGNOSIS — N183 Chronic kidney disease, stage 3 (moderate): Secondary | ICD-10-CM | POA: Diagnosis not present

## 2018-09-17 DIAGNOSIS — R809 Proteinuria, unspecified: Secondary | ICD-10-CM | POA: Diagnosis not present

## 2018-09-17 DIAGNOSIS — I10 Essential (primary) hypertension: Secondary | ICD-10-CM | POA: Diagnosis not present

## 2018-12-09 ENCOUNTER — Telehealth: Payer: Self-pay | Admitting: Nurse Practitioner

## 2018-12-09 NOTE — Telephone Encounter (Signed)
Masking on airplanes is required by all on board.  Social distancing is not encouraged by some airlines, so I would encourage choosing one that is limiting capacity.  Flying does put you at higher risk overall for Covid-19 transmission.  I would not encourage flying, but if you determine that the risk of higher Covid-19 transmission is worth the benefits of the trip flying is okay.   She may receive her flu shot any time.  Schedule a vaccine visit if possible.

## 2018-12-09 NOTE — Telephone Encounter (Signed)
Pt wanted your advise about flying to [place] with her health condition. She also wanted to know if she need to wait until appt in Oct to get her flu Shot. Please advise pt.

## 2018-12-09 NOTE — Telephone Encounter (Signed)
The pt was notified. She verbalize understanding, no questions or concern.

## 2019-01-06 ENCOUNTER — Ambulatory Visit (INDEPENDENT_AMBULATORY_CARE_PROVIDER_SITE_OTHER): Payer: Medicare PPO | Admitting: Nurse Practitioner

## 2019-01-06 ENCOUNTER — Other Ambulatory Visit: Payer: Self-pay

## 2019-01-06 ENCOUNTER — Encounter: Payer: Self-pay | Admitting: Nurse Practitioner

## 2019-01-06 DIAGNOSIS — F5104 Psychophysiologic insomnia: Secondary | ICD-10-CM | POA: Diagnosis not present

## 2019-01-06 DIAGNOSIS — L900 Lichen sclerosus et atrophicus: Secondary | ICD-10-CM | POA: Diagnosis not present

## 2019-01-06 DIAGNOSIS — I10 Essential (primary) hypertension: Secondary | ICD-10-CM | POA: Diagnosis not present

## 2019-01-06 DIAGNOSIS — E269 Hyperaldosteronism, unspecified: Secondary | ICD-10-CM

## 2019-01-06 DIAGNOSIS — E890 Postprocedural hypothyroidism: Secondary | ICD-10-CM | POA: Diagnosis not present

## 2019-01-06 MED ORDER — SYNTHROID 88 MCG PO TABS: 88.0000 ug | ORAL_TABLET | Freq: Every day | ORAL | 1 refills | Status: DC

## 2019-01-06 MED ORDER — CLOBETASOL PROPIONATE 0.05 % EX OINT: TOPICAL_OINTMENT | CUTANEOUS | 1 refills | Status: DC

## 2019-01-06 MED ORDER — NEBIVOLOL HCL 5 MG PO TABS: 5.0000 mg | ORAL_TABLET | Freq: Every day | ORAL | 1 refills | Status: DC

## 2019-01-06 MED ORDER — SPIRONOLACTONE 25 MG PO TABS: 25.0000 mg | ORAL_TABLET | Freq: Two times a day (BID) | ORAL | 1 refills | Status: DC

## 2019-01-06 MED ORDER — NIFEDIPINE ER OSMOTIC RELEASE 60 MG PO TB24: ORAL_TABLET | ORAL | 1 refills | Status: DC

## 2019-01-06 MED ORDER — LORAZEPAM 0.5 MG PO TABS: 0.5000 mg | ORAL_TABLET | Freq: Every evening | ORAL | 5 refills | Status: DC | PRN

## 2019-01-06 NOTE — Progress Notes (Signed)
Subjective:    [name], female    DOB: [birth year], 78 y.o.   MRN: [id number]  Margaret Black is a 78 y.o. female presenting on 01/06/2019 for Hypertension (cholesterol)  HPI Hypothyroidism  Patient continues Synthroid 88 mcg.  Voice hoarseness, dry cough since thyroidectomy. - Skin and hair continues to be very dry.  - Patient is also having some mild palpitations - Patient is becoming overtired, only 1-2 lbs with anxiety.  Anxiety Patient continues only at bedtime.  Anxiety is mildly increased, feels lack of family/social support system.  - Coping skills: Is taking long walk in mornings on a walking track at park.  Daughter is working with Covid+ patients as Therapist, sports.  Patient notes she feels helpless to the situation and wants to visit, but is being asked to stay away due to children fearing passing Covid to their mother/father causing death if they were to contract Covid.  Allergies  Very mild allergy congestion.  Is avoiding OTC meds, but has not needed them.  No complication.  Hypertension with hyperaldosteronism Next visit with Dr. Holley Raring at [facility] in 2 weeks.   - Patient continues medications and tolerates well without side effects. - Pt denies headache, lightheadedness, dizziness, changes in vision, chest tightness/pressure, palpitations, leg swelling, sudden loss of speech or loss of consciousness.   COPD Used albuterol rarely.  Is having slight increased allergy response. Former smoker.    Social History   Tobacco Use  . Smoking status: Former Smoker    Types: Cigarettes    Quit date: 07/04/1991    Years since quitting: 27.5  . Smokeless tobacco: Never Used  . Tobacco comment: Quit in early 1980's.  Substance Use Topics  . Alcohol use: No  . Drug use: No    Review of Systems Per HPI unless specifically indicated above     Objective:    BP 111/69 (BP Location: Right Arm, Patient Position: Sitting, Cuff Size: Normal)   Pulse (!) 58   Ht 5\' 5"  (1.651 m)    Wt 132 lb 12.8 oz (60.2 kg)   BMI 22.10 kg/m   Wt Readings from Last 3 Encounters:  01/06/19 132 lb 12.8 oz (60.2 kg)  06/04/18 134 lb (60.8 kg)  02/03/18 133 lb 6.4 oz (60.5 kg)    Physical Exam Vitals signs reviewed.  Constitutional:      General: She is awake. She is not in acute distress.    Appearance: She is well-developed.  HENT:     Head: Normocephalic and atraumatic.  Neck:     Musculoskeletal: Normal range of motion and neck supple.     Vascular: No carotid bruit.  Cardiovascular:     Rate and Rhythm: Normal rate and regular rhythm.     Pulses:          Radial pulses are 2+ on the right side and 2+ on the left side.       Posterior tibial pulses are 1+ on the right side and 1+ on the left side.     Heart sounds: Normal heart sounds, S1 normal and S2 normal. No murmur. No friction rub. No gallop.   Pulmonary:     Effort: Pulmonary effort is normal. No respiratory distress.     Breath sounds: Normal breath sounds and air entry.  Skin:    General: Skin is warm and dry.     Capillary Refill: Capillary refill takes less than 2 seconds.  Neurological:     General:  No focal deficit present.     Mental Status: She is alert and oriented to person, place, and time. Mental status is at baseline.  Psychiatric:        Attention and Perception: Attention normal.        Mood and Affect: Mood and affect normal.        Behavior: Behavior normal. Behavior is cooperative.        Thought Content: Thought content normal.        Judgment: Judgment normal.    Results for orders placed or performed in visit on 08/02/18  Thyroglobulin antibody  Result Value Ref Range   Thyroglobulin Ab 1 < or = 1 IU/mL  CBC with Differential/Platelet  Result Value Ref Range   WBC 6.9 3.8 - 10.8 Thousand/uL   RBC 3.87 3.80 - 5.10 Million/uL   Hemoglobin 12.3 11.7 - 15.5 g/dL   HCT 36.9 35.0 - 45.0 %   MCV 95.3 80.0 - 100.0 fL   MCH 31.8 27.0 - 33.0 pg   MCHC 33.3 32.0 - 36.0 g/dL   RDW 12.3  11.0 - 15.0 %   Platelets 323 140 - 400 Thousand/uL   MPV 10.3 7.5 - 12.5 fL   Neutro Abs 4,775 1,500 - 7,800 cells/uL   Lymphs Abs 1,125 850 - 3,900 cells/uL   Absolute Monocytes 559 200 - 950 cells/uL   Eosinophils Absolute 359 15 - 500 cells/uL   Basophils Absolute 83 0 - 200 cells/uL   Neutrophils Relative % 69.2 %   Total Lymphocyte 16.3 %   Monocytes Relative 8.1 %   Eosinophils Relative 5.2 %   Basophils Relative 1.2 %  Thyroglobulin Level  Result Value Ref Range   Thyroglobulin <0.1 (L) ng/mL   Comment    T4, free  Result Value Ref Range   Free T4 1.6 0.8 - 1.8 ng/dL  TSH  Result Value Ref Range   TSH 1.92 0.40 - 4.50 mIU/L  COMPLETE METABOLIC PANEL WITH GFR  Result Value Ref Range   Glucose, Bld 101 (H) 65 - 99 mg/dL   BUN 23 7 - 25 mg/dL   Creat 1.36 (H) 0.60 - 0.93 mg/dL   GFR, Est Non African American 37 (L) > OR = 60 mL/min/1.7m2   GFR, Est African American 43 (L) > OR = 60 mL/min/1.28m2   BUN/Creatinine Ratio 17 6 - 22 (calc)   Sodium 142 135 - 146 mmol/L   Potassium 5.3 3.5 - 5.3 mmol/L   Chloride 105 98 - 110 mmol/L   CO2 27 20 - 32 mmol/L   Calcium 10.2 8.6 - 10.4 mg/dL   Total Protein 7.1 6.1 - 8.1 g/dL   Albumin 4.1 3.6 - 5.1 g/dL   Globulin 3.0 1.9 - 3.7 g/dL (calc)   AG Ratio 1.4 1.0 - 2.5 (calc)   Total Bilirubin 0.8 0.2 - 1.2 mg/dL   Alkaline phosphatase (APISO) 87 37 - 153 U/L   AST 18 10 - 35 U/L   ALT 16 6 - 29 U/L      Assessment & Plan:   Problem List Items Addressed This Visit      Cardiovascular and Mediastinum   Essential hypertension Stable today on exam.  Medications tolerated without side effects.  Continue at current doses.  Refills provided.  Labs to be checked w Dr. Holley Raring in about 2 weeks at appointment [facility]. Followup 6 months.    Relevant Medications   nebivolol (BYSTOLIC) 5 MG tablet   NIFEdipine (NIFEDICAL  XL) 60 MG 24 hr tablet   spironolactone (ALDACTONE) 25 MG tablet     Endocrine   Postsurgical hypothyroidism  Stable today on exam.  Medications tolerated without side effects.  Continue at current doses.  Refills provided.  Check TSH w labs at Dr. Holley Raring. Followup 6 months.   Relevant Medications   nebivolol (BYSTOLIC) 5 MG tablet   SYNTHROID 88 MCG tablet   HYPERALDOSTERONISM UNSPECIFIED Continue labs and follow-up with Dr. Holley Raring [facility].  Follow-up prn.   Relevant Medications   spironolactone (ALDACTONE) 25 MG tablet     Other   Insomnia Stable on exam today.  Good sleep with lorazepam nightly.  Continue medication without changes.  Refills provided.  Follow-up 6 months.   Relevant Medications   LORazepam (ATIVAN) 0.5 MG tablet    Other Visit Diagnoses    Lichen sclerosus     Patient reports is stable with using clobetasol.  Continue prn for flares. Follow-up 6 months.   Relevant Medications   clobetasol ointment (TEMOVATE) 0.05 %      Meds ordered this encounter  Medications  . clobetasol ointment (TEMOVATE) 0.05 %    Sig: Apply a fingertip amount to your vagina nightly for 6-12 weeks.    Dispense:  45 g    Refill:  1    Order Specific Question:   Supervising Provider    Answer:   Olin Hauser [[id number]]  . LORazepam (ATIVAN) 0.5 MG tablet    Sig: Take 1 tablet (0.5 mg total) by mouth at bedtime as needed for anxiety or sleep.    Dispense:  30 tablet    Refill:  5    Order Specific Question:   Supervising Provider    Answer:   Olin Hauser [[id number]]  . nebivolol (BYSTOLIC) 5 MG tablet    Sig: Take 1 tablet (5 mg total) by mouth daily.    Dispense:  90 tablet    Refill:  1    Order Specific Question:   Supervising Provider    Answer:   Olin Hauser [[id number]]  . NIFEdipine (NIFEDICAL XL) 60 MG 24 hr tablet    Sig: TAKE 1 TABLET EVERY EVENING    Dispense:  90 tablet    Refill:  1    Order Specific Question:   Supervising Provider    Answer:   Olin Hauser [[id number]]  . spironolactone (ALDACTONE) 25 MG tablet    Sig: Take 1 tablet (25 mg total)  by mouth 2 (two) times daily.    Dispense:  180 tablet    Refill:  1    Order Specific Question:   Supervising Provider    Answer:   Olin Hauser [[id number]]  . SYNTHROID 88 MCG tablet    Sig: Take 1 tablet (88 mcg total) by mouth daily before breakfast.    Dispense:  90 tablet    Refill:  1    Order Specific Question:   Supervising Provider    Answer:   Olin Hauser [[id number]]    Follow up plan: Return in about 6 months (around 07/07/2019) for insomnia, copd, hypertension.  Cassell Smiles, DNP, AGPCNP-BC Adult Gerontology Primary Care Nurse Practitioner [facility] 01/06/2019, 11:33 AM

## 2019-01-08 ENCOUNTER — Encounter: Payer: Self-pay | Admitting: Nurse Practitioner

## 2019-01-14 DIAGNOSIS — D631 Anemia in chronic kidney disease: Secondary | ICD-10-CM | POA: Diagnosis not present

## 2019-01-14 DIAGNOSIS — I10 Essential (primary) hypertension: Secondary | ICD-10-CM | POA: Diagnosis not present

## 2019-01-14 DIAGNOSIS — M329 Systemic lupus erythematosus, unspecified: Secondary | ICD-10-CM | POA: Diagnosis not present

## 2019-01-14 DIAGNOSIS — N183 Chronic kidney disease, stage 3 unspecified: Secondary | ICD-10-CM | POA: Diagnosis not present

## 2019-01-14 DIAGNOSIS — N119 Chronic tubulo-interstitial nephritis, unspecified: Secondary | ICD-10-CM | POA: Diagnosis not present

## 2019-01-19 DIAGNOSIS — D631 Anemia in chronic kidney disease: Secondary | ICD-10-CM | POA: Insufficient documentation

## 2019-01-20 DIAGNOSIS — N1832 Chronic kidney disease, stage 3b: Secondary | ICD-10-CM | POA: Diagnosis not present

## 2019-01-20 DIAGNOSIS — R809 Proteinuria, unspecified: Secondary | ICD-10-CM | POA: Diagnosis not present

## 2019-01-20 DIAGNOSIS — I10 Essential (primary) hypertension: Secondary | ICD-10-CM | POA: Diagnosis not present

## 2019-02-10 DIAGNOSIS — H34812 Central retinal vein occlusion, left eye, with macular edema: Secondary | ICD-10-CM | POA: Diagnosis not present

## 2019-02-10 DIAGNOSIS — H40013 Open angle with borderline findings, low risk, bilateral: Secondary | ICD-10-CM | POA: Diagnosis not present

## 2019-02-10 DIAGNOSIS — H43812 Vitreous degeneration, left eye: Secondary | ICD-10-CM | POA: Diagnosis not present

## 2019-03-24 DIAGNOSIS — R809 Proteinuria, unspecified: Secondary | ICD-10-CM | POA: Diagnosis not present

## 2019-03-24 DIAGNOSIS — N1832 Chronic kidney disease, stage 3b: Secondary | ICD-10-CM | POA: Diagnosis not present

## 2019-03-24 DIAGNOSIS — I10 Essential (primary) hypertension: Secondary | ICD-10-CM | POA: Diagnosis not present

## 2019-04-19 ENCOUNTER — Other Ambulatory Visit: Payer: Self-pay

## 2019-04-19 DIAGNOSIS — F5104 Psychophysiologic insomnia: Secondary | ICD-10-CM

## 2019-04-19 DIAGNOSIS — I10 Essential (primary) hypertension: Secondary | ICD-10-CM

## 2019-04-19 DIAGNOSIS — E269 Hyperaldosteronism, unspecified: Secondary | ICD-10-CM

## 2019-04-19 MED ORDER — NEBIVOLOL HCL 5 MG PO TABS: 5.0000 mg | ORAL_TABLET | Freq: Every day | ORAL | 1 refills | Status: DC

## 2019-04-19 MED ORDER — SPIRONOLACTONE 25 MG PO TABS: 25.0000 mg | ORAL_TABLET | Freq: Two times a day (BID) | ORAL | 1 refills | Status: DC

## 2019-04-19 MED ORDER — LORAZEPAM 0.5 MG PO TABS: 0.5000 mg | ORAL_TABLET | Freq: Every evening | ORAL | 5 refills | Status: DC | PRN

## 2019-04-19 MED ORDER — NIFEDIPINE ER OSMOTIC RELEASE 60 MG PO TB24: ORAL_TABLET | ORAL | 1 refills | Status: DC

## 2019-04-20 ENCOUNTER — Telehealth: Payer: Self-pay

## 2019-04-20 NOTE — Telephone Encounter (Signed)
Pt called stating that she reached out to [facility] and spoke with them about her Lorazepam.  She said they informed her that she can get a 90 day script vs 30 day. She is requesting that you send over a 90 day script. I informed that a PA has been submitted with a pending status. I also informed her that the 90 day is up to the provider discretion to prescribe a 30 or 90 for a control medication. Please advise

## 2019-04-20 NOTE — Telephone Encounter (Signed)
The pt was notified and she verbalize understanding, no questions or concerns.

## 2019-04-20 NOTE — Telephone Encounter (Signed)
Completed form for PA Lorazepam, it was ordered 30 day supply with refills. At this point - we would prefer to submit this PA now to get approval and in future can re order for 90 if needed, but since this has been maintained a long time at a 30 day supply, I would not change that until it is approved and she can discuss it further with Elmyra Ricks in the future.  [name], [facility] 04/20/2019, 4:43 PM

## 2019-04-25 ENCOUNTER — Ambulatory Visit (INDEPENDENT_AMBULATORY_CARE_PROVIDER_SITE_OTHER): Payer: Medicare Other | Admitting: Family Medicine

## 2019-04-25 ENCOUNTER — Encounter: Payer: Self-pay | Admitting: Family Medicine

## 2019-04-25 ENCOUNTER — Other Ambulatory Visit: Payer: Self-pay

## 2019-04-25 VITALS — BP 110/50 | HR 57 | Temp 97.3°F | Ht 65.0 in | Wt 132.6 lb

## 2019-04-25 DIAGNOSIS — I10 Essential (primary) hypertension: Secondary | ICD-10-CM | POA: Diagnosis not present

## 2019-04-25 DIAGNOSIS — K219 Gastro-esophageal reflux disease without esophagitis: Secondary | ICD-10-CM

## 2019-04-25 MED ORDER — ATENOLOL 25 MG PO TABS: 25.0000 mg | ORAL_TABLET | Freq: Every day | ORAL | 1 refills | Status: DC

## 2019-04-25 MED ORDER — LANSOPRAZOLE 15 MG PO CPDR: 15.0000 mg | DELAYED_RELEASE_CAPSULE | Freq: Every day | ORAL | 3 refills | Status: DC

## 2019-04-25 NOTE — Progress Notes (Signed)
Subjective:    [name], female    DOB: December 12, 1940, 79 y.o.   MRN: [id number]  Margaret Black is a 79 y.o. female presenting on 04/25/2019 for Hypertension (the pt would like to discuss switching her bp medication over to generic because of the cost. ) and Gastroesophageal Reflux (pt would like to go back on her lansoprazole. She complains of more frequent eposides of heartburns. )   HPI   CHRONIC HTN: Reports chronic history in past with difficulty managing her HTN with Cardiologist and Nephrologist in past, she has history of "CVA behind eye". She has been currently managed on current regimen with good results over past 4 years, now she has new drug company through insurance and says Bystolic was no longer covered. Now they recommended Atenolol as comparable med change covered Current Meds -  Spironolactone 25mg  BID, Bystolic 5mg  daily, Enalapril 2.5mg  daily, Amlodipine 2.5mg  daily Reports good compliance, took meds today. Tolerating well, w/o complaints. Denies CP, dyspnea, HA, edema, dizziness / lightheadedness  GERD Chronic problem. She was on Lansoeprazole 15mg  daily previously, but eventually came off this within past year, and did well off it, she did trial on her own to see if she still needed it and eventually within past few week decided flare up GERD heartburn request to resume, needs new rx.  Insomnia Chronic problem with insomnia, on BDZ Lorazepam for >10+ years, she used to be on higher dose, was originally rx by Cardiology years back due to high BP and anxiety perhaps at night, and it has helped her sleep, she does well now on low dose Lorazepam 0.5mg  nightly, she usually doesn't miss dose but if she does can occasionally keep her awake. No serious history of withdrawal symptoms. Does very well on med, no side effects or grogginess drowsiness or daytime concerns.    Depression screen [facility] 2/9 01/06/2019 07/09/2018 12/15/2017  Decreased Interest 0 0 0  Down,  Depressed, Hopeless 1 0 0  PHQ - 2 Score 1 0 0  Altered sleeping - 0 -  Tired, decreased energy - 0 -  Change in appetite - 0 -  Feeling bad or failure about yourself  - 0 -  Trouble concentrating - 0 -  Moving slowly or fidgety/restless - 0 -  Suicidal thoughts - 0 -  PHQ-9 Score - 0 -  Difficult doing work/chores - Not difficult at all -  Some recent data might be hidden    Social History   Tobacco Use  . Smoking status: Former Smoker    Types: Cigarettes    Quit date: 07/04/1991    Years since quitting: 27.8  . Smokeless tobacco: Never Used  . Tobacco comment: Quit in early 1980's.  Substance Use Topics  . Alcohol use: No  . Drug use: No    Review of Systems Per HPI unless specifically indicated above     Objective:    BP (!) 110/50 (BP Location: Left Arm, Patient Position: Sitting, Cuff Size: Normal)   Pulse (!) 57   Temp (!) 97.3 F (36.3 C) (Oral)   Ht 5\' 5"  (1.651 m)   Wt 132 lb 9.6 oz (60.1 kg)   BMI 22.07 kg/m   Wt Readings from Last 3 Encounters:  04/25/19 132 lb 9.6 oz (60.1 kg)  01/06/19 132 lb 12.8 oz (60.2 kg)  06/04/18 134 lb (60.8 kg)    Physical Exam Vitals and nursing note reviewed.  Constitutional:      General:  She is not in acute distress.    Appearance: She is well-developed. She is not diaphoretic.     Comments: Well-appearing, comfortable, cooperative  HENT:     Head: Normocephalic and atraumatic.  Eyes:     General:        Right eye: No discharge.        Left eye: No discharge.     Conjunctiva/sclera: Conjunctivae normal.  Cardiovascular:     Rate and Rhythm: Normal rate.  Pulmonary:     Effort: Pulmonary effort is normal.  Skin:    General: Skin is warm and dry.     Findings: No erythema or rash.  Neurological:     Mental Status: She is alert and oriented to person, place, and time.  Psychiatric:        Behavior: Behavior normal.     Comments: Well groomed, good eye contact, normal speech and thoughts        Results  for orders placed or performed in visit on 08/02/18  Thyroglobulin antibody  Result Value Ref Range   Thyroglobulin Ab 1 < or = 1 IU/mL  CBC with Differential/Platelet  Result Value Ref Range   WBC 6.9 3.8 - 10.8 Thousand/uL   RBC 3.87 3.80 - 5.10 Million/uL   Hemoglobin 12.3 11.7 - 15.5 g/dL   HCT 36.9 35.0 - 45.0 %   MCV 95.3 80.0 - 100.0 fL   MCH 31.8 27.0 - 33.0 pg   MCHC 33.3 32.0 - 36.0 g/dL   RDW 12.3 11.0 - 15.0 %   Platelets 323 140 - 400 Thousand/uL   MPV 10.3 7.5 - 12.5 fL   Neutro Abs 4,775 1,500 - 7,800 cells/uL   Lymphs Abs 1,125 850 - 3,900 cells/uL   Absolute Monocytes 559 200 - 950 cells/uL   Eosinophils Absolute 359 15 - 500 cells/uL   Basophils Absolute 83 0 - 200 cells/uL   Neutrophils Relative % 69.2 %   Total Lymphocyte 16.3 %   Monocytes Relative 8.1 %   Eosinophils Relative 5.2 %   Basophils Relative 1.2 %  Thyroglobulin Level  Result Value Ref Range   Thyroglobulin <0.1 (L) ng/mL   Comment    T4, free  Result Value Ref Range   Free T4 1.6 0.8 - 1.8 ng/dL  TSH  Result Value Ref Range   TSH 1.92 0.40 - 4.50 mIU/L  COMPLETE METABOLIC PANEL WITH GFR  Result Value Ref Range   Glucose, Bld 101 (H) 65 - 99 mg/dL   BUN 23 7 - 25 mg/dL   Creat 1.36 (H) 0.60 - 0.93 mg/dL   GFR, Est Non African American 37 (L) > OR = 60 mL/min/1.76m2   GFR, Est African American 43 (L) > OR = 60 mL/min/1.73m2   BUN/Creatinine Ratio 17 6 - 22 (calc)   Sodium 142 135 - 146 mmol/L   Potassium 5.3 3.5 - 5.3 mmol/L   Chloride 105 98 - 110 mmol/L   CO2 27 20 - 32 mmol/L   Calcium 10.2 8.6 - 10.4 mg/dL   Total Protein 7.1 6.1 - 8.1 g/dL   Albumin 4.1 3.6 - 5.1 g/dL   Globulin 3.0 1.9 - 3.7 g/dL (calc)   AG Ratio 1.4 1.0 - 2.5 (calc)   Total Bilirubin 0.8 0.2 - 1.2 mg/dL   Alkaline phosphatase (APISO) 87 37 - 153 U/L   AST 18 10 - 35 U/L   ALT 16 6 - 29 U/L      Assessment &  Plan:   Problem List Items Addressed This Visit    GERD - Primary  Chronic problem, now  flare up off PPI Restart Lansoprazole 15mg  daily before 1st meal in AM, new rx 90 day Future if need can do BID vs switch PPI    Relevant Medications   lansoprazole (PREVACID) 15 MG capsule   Essential hypertension Chronic HTN followed by Cardiology Nephrology Has been on variety of meds, specific regimen has worked well, now had to change due to insurance.  DC Bystolic 5mg  Start Atenolol 25mg  daily - both are beta 1 selective Continue other BP medications No other change Monitor BP 1-2 weeks f/u if concerns, can adjust dose Atenolol    Relevant Medications   atenolol (TENORMIN) 25 MG tablet      Meds ordered this encounter  Medications  . lansoprazole (PREVACID) 15 MG capsule    Sig: Take 1 capsule (15 mg total) by mouth daily before breakfast.    Dispense:  90 capsule    Refill:  3  . atenolol (TENORMIN) 25 MG tablet    Sig: Take 1 tablet (25 mg total) by mouth daily.    Dispense:  90 tablet    Refill:  1    Discontinue Bystolic, switch to Atenolol due to insurance cost/coverage    Follow up plan: Return in about 3 months (around 07/23/2019) for HTN, GERD.   [name], DO [facility] 04/25/2019, 10:51 AM

## 2019-04-25 NOTE — Patient Instructions (Addendum)
Thank you for coming to the office today.  Stop Bystolic switch to Atenolol 25mg  once daily, new rx sent - check BP closely 1-2 times a day for next 1-2 weeks, call or message with results, if all is well, continue current dose, otherwise may adjust further  Re ordered the Lansoprazole take in morning before 1st meal.  Lorazepam was filled on 04/24/19 - stay tuned for it to arrive, 30 day supply several refills, in future if you prefer the 90 day can discuss it next time.   Please schedule a Follow-up Appointment to: Return in about 3 months (around 07/23/2019) for HTN, GERD.  If you have any other questions or concerns, please feel free to call the office or send a message through [facility]. You may also schedule an earlier appointment if necessary.  Additionally, you may be receiving a survey about your experience at our office within a few days to 1 week by e-mail or mail. We value your feedback.  [name], DO [place]

## 2019-05-11 ENCOUNTER — Other Ambulatory Visit: Payer: Self-pay | Admitting: Sports Medicine

## 2019-05-11 DIAGNOSIS — G8929 Other chronic pain: Secondary | ICD-10-CM

## 2019-05-11 DIAGNOSIS — M25511 Pain in right shoulder: Secondary | ICD-10-CM

## 2019-05-11 DIAGNOSIS — M7541 Impingement syndrome of right shoulder: Secondary | ICD-10-CM

## 2019-05-23 ENCOUNTER — Ambulatory Visit
Admission: RE | Admit: 2019-05-23 | Discharge: 2019-05-23 | Disposition: A | Discharge status: Home / Self Care | Payer: Medicare Other | Source: Ambulatory Visit | Attending: Sports Medicine | Admitting: Sports Medicine

## 2019-05-23 ENCOUNTER — Other Ambulatory Visit: Payer: Self-pay

## 2019-05-23 DIAGNOSIS — M25511 Pain in right shoulder: Secondary | ICD-10-CM | POA: Insufficient documentation

## 2019-05-23 DIAGNOSIS — G8929 Other chronic pain: Secondary | ICD-10-CM | POA: Insufficient documentation

## 2019-05-23 DIAGNOSIS — M7541 Impingement syndrome of right shoulder: Secondary | ICD-10-CM | POA: Diagnosis present

## 2019-05-23 DIAGNOSIS — M5441 Lumbago with sciatica, right side: Secondary | ICD-10-CM | POA: Diagnosis present

## 2019-06-07 ENCOUNTER — Other Ambulatory Visit: Payer: Self-pay

## 2019-06-07 ENCOUNTER — Encounter: Payer: Self-pay | Admitting: Family Medicine

## 2019-06-07 ENCOUNTER — Ambulatory Visit (INDEPENDENT_AMBULATORY_CARE_PROVIDER_SITE_OTHER): Payer: Medicare Other | Admitting: Family Medicine

## 2019-06-07 VITALS — BP 116/45 | HR 57 | Temp 97.1°F | Ht 65.0 in | Wt 135.2 lb

## 2019-06-07 DIAGNOSIS — F419 Anxiety disorder, unspecified: Secondary | ICD-10-CM | POA: Diagnosis not present

## 2019-06-07 NOTE — Assessment & Plan Note (Signed)
Increased anxiety with husband's depression at home.  Reviewed lifestyle modifications and will be in touch with patient's husband for further assessment.  To continue medications as directed, continue to utilize family/friends for support, return to clinic as needed, call with any questions/concerns/needs

## 2019-06-07 NOTE — Progress Notes (Signed)
Subjective:    [name], female    DOB: April 06, 1940, 79 y.o.   MRN: [id number]  Margaret Black is a 79 y.o. female presenting on 06/07/2019 for Consult (pt want to discuss concerns about her husbands showing signs of depression. )   HPI  Margaret Black presents to clinic for discussion of increased concern for husband and his withdrawal in social settings, events, and increased concern for his depression.  She has increased anxiety related to changes in him over the past few years.    Depression screen [facility] 2/9 01/06/2019 07/09/2018 12/15/2017  Decreased Interest 0 0 0  Down, Depressed, Hopeless 1 0 0  PHQ - 2 Score 1 0 0  Altered sleeping - 0 -  Tired, decreased energy - 0 -  Change in appetite - 0 -  Feeling bad or failure about yourself  - 0 -  Trouble concentrating - 0 -  Moving slowly or fidgety/restless - 0 -  Suicidal thoughts - 0 -  PHQ-9 Score - 0 -  Difficult doing work/chores - Not difficult at all -  Some recent data might be hidden    Social History   Tobacco Use  . Smoking status: Former Smoker    Types: Cigarettes    Quit date: 07/04/1991    Years since quitting: 27.9  . Smokeless tobacco: Never Used  . Tobacco comment: Quit in early 1980's.  Substance Use Topics  . Alcohol use: No  . Drug use: No    Review of Systems  Constitutional: Negative.   HENT: Negative.   Eyes: Negative.   Respiratory: Negative.   Cardiovascular: Negative.   Gastrointestinal: Negative.   Endocrine: Negative.   Genitourinary: Negative.   Musculoskeletal: Negative.   Skin: Negative.   Allergic/Immunologic: Negative.   Neurological: Negative.   Hematological: Negative.   Psychiatric/Behavioral: Positive for dysphoric mood. Negative for agitation, behavioral problems, confusion, decreased concentration, hallucinations, self-injury, sleep disturbance and suicidal ideas. The patient is nervous/anxious. The patient is not hyperactive.    Per HPI unless specifically  indicated above     Objective:    BP (!) 116/45   Pulse (!) 57   Temp (!) 97.1 F (36.2 C) (Temporal)   Ht 5\' 5"  (1.651 m)   Wt 135 lb 3.2 oz (61.3 kg)   BMI 22.50 kg/m   Wt Readings from Last 3 Encounters:  06/07/19 135 lb 3.2 oz (61.3 kg)  04/25/19 132 lb 9.6 oz (60.1 kg)  01/06/19 132 lb 12.8 oz (60.2 kg)    Physical Exam Vitals reviewed.  Constitutional:      General: She is not in acute distress.    Appearance: Normal appearance. She is normal weight. She is not ill-appearing or toxic-appearing.  HENT:     Head: Normocephalic.  Eyes:     General: Lids are normal. Vision grossly intact.        Right eye: No discharge.        Left eye: No discharge.     Extraocular Movements: Extraocular movements intact.     Conjunctiva/sclera: Conjunctivae normal.     Pupils: Pupils are equal, round, and reactive to light.  Pulmonary:     Effort: Pulmonary effort is normal.  Skin:    General: Skin is dry.     Capillary Refill: Capillary refill takes less than 2 seconds.  Neurological:     General: No focal deficit present.     Mental Status: She is alert and oriented to  person, place, and time.     Coordination: Coordination normal.     Gait: Gait normal.  Psychiatric:        Attention and Perception: Attention and perception normal.        Mood and Affect: Mood is depressed. Affect is tearful.        Speech: Speech normal.        Behavior: Behavior normal. Behavior is cooperative.        Thought Content: Thought content normal.        Cognition and Memory: Cognition and memory normal.        Judgment: Judgment normal.     Results for orders placed or performed in visit on 08/02/18  Thyroglobulin antibody  Result Value Ref Range   Thyroglobulin Ab 1 < or = 1 IU/mL  CBC with Differential/Platelet  Result Value Ref Range   WBC 6.9 3.8 - 10.8 Thousand/uL   RBC 3.87 3.80 - 5.10 Million/uL   Hemoglobin 12.3 11.7 - 15.5 g/dL   HCT 36.9 35.0 - 45.0 %   MCV 95.3 80.0 -  100.0 fL   MCH 31.8 27.0 - 33.0 pg   MCHC 33.3 32.0 - 36.0 g/dL   RDW 12.3 11.0 - 15.0 %   Platelets 323 140 - 400 Thousand/uL   MPV 10.3 7.5 - 12.5 fL   Neutro Abs 4,775 1,500 - 7,800 cells/uL   Lymphs Abs 1,125 850 - 3,900 cells/uL   Absolute Monocytes 559 200 - 950 cells/uL   Eosinophils Absolute 359 15 - 500 cells/uL   Basophils Absolute 83 0 - 200 cells/uL   Neutrophils Relative % 69.2 %   Total Lymphocyte 16.3 %   Monocytes Relative 8.1 %   Eosinophils Relative 5.2 %   Basophils Relative 1.2 %  Thyroglobulin Level  Result Value Ref Range   Thyroglobulin <0.1 (L) ng/mL   Comment    T4, free  Result Value Ref Range   Free T4 1.6 0.8 - 1.8 ng/dL  TSH  Result Value Ref Range   TSH 1.92 0.40 - 4.50 mIU/L  COMPLETE METABOLIC PANEL WITH GFR  Result Value Ref Range   Glucose, Bld 101 (H) 65 - 99 mg/dL   BUN 23 7 - 25 mg/dL   Creat 1.36 (H) 0.60 - 0.93 mg/dL   GFR, Est Non African American 37 (L) > OR = 60 mL/min/1.54m2   GFR, Est African American 43 (L) > OR = 60 mL/min/1.72m2   BUN/Creatinine Ratio 17 6 - 22 (calc)   Sodium 142 135 - 146 mmol/L   Potassium 5.3 3.5 - 5.3 mmol/L   Chloride 105 98 - 110 mmol/L   CO2 27 20 - 32 mmol/L   Calcium 10.2 8.6 - 10.4 mg/dL   Total Protein 7.1 6.1 - 8.1 g/dL   Albumin 4.1 3.6 - 5.1 g/dL   Globulin 3.0 1.9 - 3.7 g/dL (calc)   AG Ratio 1.4 1.0 - 2.5 (calc)   Total Bilirubin 0.8 0.2 - 1.2 mg/dL   Alkaline phosphatase (APISO) 87 37 - 153 U/L   AST 18 10 - 35 U/L   ALT 16 6 - 29 U/L      Assessment & Plan:   Problem List Items Addressed This Visit      Other   Anxiety - Primary    Increased anxiety with husband's depression at home.  Reviewed lifestyle modifications and will be in touch with patient's husband for further assessment.  To continue medications as directed,  continue to utilize family/friends for support, return to clinic as needed, call with any questions/concerns/needs         No orders of the defined types  were placed in this encounter.     Follow up plan: Return if symptoms worsen or fail to improve.   Harlin Rain, [place] [facility] Nurse Practitioner [facility] 06/07/2019, 4:37 PM

## 2019-06-07 NOTE — Patient Instructions (Signed)
As we discussed, will work on lifestyle modifications with Mr. Croke and working together as a team towards your concerns will hopefully lessen your anxiety.  Please contact with any questions/concerns/needs and I will try my best to make myself available.  You will receive a survey after today's visit either digitally by e-mail or paper by [employer]. Your experiences and feedback matter to [place].  Please respond so we know how we are doing as we provide care for you.  Call us with any questions/concerns/needs.  It is my goal to be available to you for your health concerns.  Thanks for choosing me to be a partner in your healthcare needs!  Harlin Rain, FNP-C Family Nurse Practitioner [facility] Phone: [phone]

## 2019-06-21 ENCOUNTER — Ambulatory Visit (INDEPENDENT_AMBULATORY_CARE_PROVIDER_SITE_OTHER): Payer: Medicare Other | Admitting: Family Medicine

## 2019-06-21 ENCOUNTER — Encounter: Payer: Self-pay | Admitting: Family Medicine

## 2019-06-21 ENCOUNTER — Other Ambulatory Visit: Payer: Self-pay

## 2019-06-21 DIAGNOSIS — K5792 Diverticulitis of intestine, part unspecified, without perforation or abscess without bleeding: Secondary | ICD-10-CM | POA: Diagnosis not present

## 2019-06-21 DIAGNOSIS — N760 Acute vaginitis: Secondary | ICD-10-CM

## 2019-06-21 DIAGNOSIS — R1084 Generalized abdominal pain: Secondary | ICD-10-CM | POA: Diagnosis not present

## 2019-06-21 DIAGNOSIS — R109 Unspecified abdominal pain: Secondary | ICD-10-CM | POA: Insufficient documentation

## 2019-06-21 MED ORDER — CIPROFLOXACIN HCL 250 MG PO TABS: 250.0000 mg | ORAL_TABLET | Freq: Two times a day (BID) | ORAL | 0 refills | Status: DC

## 2019-06-21 MED ORDER — FLUCONAZOLE 150 MG PO TABS: ORAL_TABLET | ORAL | 0 refills | Status: DC

## 2019-06-21 MED ORDER — METRONIDAZOLE 500 MG PO TABS: 500.0000 mg | ORAL_TABLET | Freq: Two times a day (BID) | ORAL | 0 refills | Status: DC

## 2019-06-21 NOTE — Progress Notes (Signed)
Virtual Visit via Telephone  The purpose of this virtual visit is to provide medical care while limiting exposure to the novel coronavirus (COVID19) for both patient and office staff.  Consent was obtained for phone visit:  Yes.   Answered questions that patient had about telehealth interaction:  Yes.   I discussed the limitations, risks, security and privacy concerns of performing an evaluation and management service by telephone. I also discussed with the patient that there may be a patient responsible charge related to this service. The patient expressed understanding and agreed to proceed.  Patient is at home and is accessed via telephone Services are provided by Harlin Rain, FNP-C from [facility])  ---------------------------------------------------------------------- Chief Complaint  Patient presents with  . Abdominal Pain    severe abdominal cramping pain 25 minutes after eating strawberries, irregular bm w/ mucus blood mixed in stool, vomiting, chills, and nauseated x 12 hrs. Pt state the symptoms have since improved, but not resolved.      S: Reviewed CMA documentation. I have called patient and gathered additional HPI as follows:  Ms. Carlo reports that last evening she had eaten some strawberries and approximately 25-30 minutes after eating them she had severe abdominal cramping pain with irregular bowel movements that had blood mixed into her stool.  Had episodes of n/v and chills since, that her symptoms have since improved but have not resolved.  Has a history of diverticulitis and followed with gastroenterology for years.  States was told years ago that she did not have to limit her diet unless a certain food would trigger her symptoms and has eaten strawberries a few times over the past few years without issue.  Patient is currently home Denies any high risk travel to areas of current concern for COVID19. Denies any known or suspected exposure  to person with or possibly with COVID19.  Denies any fevers, sweats, body ache, cough, shortness of breath, sinus pain or pressure, headache  Past Medical History:  Diagnosis Date  . Anxiety    Related to HTN and vision loss  . Central retinal vein occlusion of left eye    legally blind in Left eye  . 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 Collagen vascular disease ([place])    uncharacterized.  has seen rheum.  positive RF, positive serologies for Sjogrens.  C-ANCA/P-ANCA neg, ANA  neg, anti-dsDNA neg, anti-SCL70 neg, anti-centromere Ab neg  . COPD (chronic obstructive pulmonary disease) ([place])    quit smoking 1993  . Diverticulosis   . Dysfunction of eustachian tube   . Fatigue   . GERD (gastroesophageal reflux disease)   . Goiter, unspecified   . HLD (hyperlipidemia)   . HTN (hypertension)    difficult to control, hypertensive urgency hospitalizations, normal urine/plasma metanephrines and catecholamines, renal dopplers without stenosis  . Hyperaldosteronism    serum aldo/renin ration elevated (aldo 23.7, renin <0.15), CT abd 2010 with normal adrenals, dedicated adrenal CT without adrenal adenoma,   . Hypothyroid   . Insomnia    Related to HTN and vision loss  . Irritable bowel syndrome   . Leiomyosarcoma of uterus ([place]) 09/22/2014  . Lung cancer ([place])    s/p L lobectomy 2001  . Lupus ([place])   . Osteopenia   . Pulmonary hypertension ([place])    echo [facility] 12/2008 with EF >5%, grade II diastolic dysfunction, RSVP >27mmHg, ACE level normal.  f/u echo at [facility] - EF 55-60%,  mild MR, mild LAE, PASP 25 + RA (no pulm HTN)  . Urge incontinence   . Uterine cancer [facility])    s/p hysterectomy 2001   Social History   Tobacco Use  . Smoking status: Former Smoker    Types: Cigarettes    Quit date: 07/04/1991    Years since quitting: 27.9  . Smokeless tobacco: Never Used  . Tobacco comment: Quit in early 1980's.  Substance Use Topics  . Alcohol use:  No  . Drug use: No    Current Outpatient Medications:  .  aspirin 81 MG tablet, Take 81 mg by mouth daily., Disp: , Rfl:  .  CALCIUM-VITAMIN D PO, Take by mouth daily., Disp: , Rfl:  .  enalapril (VASOTEC) 2.5 MG tablet, , Disp: , Rfl:  .  LORazepam (ATIVAN) 0.5 MG tablet, Take 1 tablet (0.5 mg total) by mouth at bedtime as needed for anxiety or sleep., Disp: 30 tablet, Rfl: 5 .  Multiple Vitamin (MULTIVITAMIN) tablet, Take 1 tablet by mouth daily., Disp: , Rfl:  .  nebivolol (BYSTOLIC) 5 MG tablet, Take 5 mg by mouth daily., Disp: , Rfl:  .  NIFEdipine (NIFEDICAL XL) 60 MG 24 hr tablet, TAKE 1 TABLET EVERY EVENING, Disp: 90 tablet, Rfl: 1 .  spironolactone (ALDACTONE) 25 MG tablet, Take 1 tablet (25 mg total) by mouth 2 (two) times daily., Disp: 180 tablet, Rfl: 1 .  SYNTHROID 88 MCG tablet, Take 1 tablet (88 mcg total) by mouth daily before breakfast., Disp: 90 tablet, Rfl: 1 .  vitamin C (ASCORBIC ACID) 500 MG tablet, Take 500 mg by mouth daily., Disp: , Rfl:  .  Aspirin-Calcium Carbonate 81-777 MG TABS, Take by mouth., Disp: , Rfl:  .  ciprofloxacin (CIPRO) 250 MG tablet, Take 1 tablet (250 mg total) by mouth 2 (two) times daily., Disp: 20 tablet, Rfl: 0 .  fluconazole (DIFLUCAN) 150 MG tablet, Take 1 tablet now and repeat dose in 72 hours if having continued symptoms., Disp: 2 tablet, Rfl: 0 .  metroNIDAZOLE (FLAGYL) 500 MG tablet, Take 1 tablet (500 mg total) by mouth 2 (two) times daily., Disp: 20 tablet, Rfl: 0  Depression screen [facility] 2/9 01/06/2019 07/09/2018 12/15/2017  Decreased Interest 0 0 0  Down, Depressed, Hopeless 1 0 0  PHQ - 2 Score 1 0 0  Altered sleeping - 0 -  Tired, decreased energy - 0 -  Change in appetite - 0 -  Feeling bad or failure about yourself  - 0 -  Trouble concentrating - 0 -  Moving slowly or fidgety/restless - 0 -  Suicidal thoughts - 0 -  PHQ-9 Score - 0 -  Difficult doing work/chores - Not difficult at all -  Some recent data might be hidden     GAD 7 : Generalized Anxiety Score 07/09/2018 07/01/2016  Nervous, Anxious, on Edge 1 0  Control/stop worrying 0 1  Worry too much - different things 0 0  Trouble relaxing 0 0  Restless 0 0  Easily annoyed or irritable 0 1  Afraid - awful might happen 0 0  Total GAD 7 Score 1 2  Anxiety Difficulty - Not difficult at all    -------------------------------------------------------------------------- O: No physical exam performed due to remote telephone encounter.  Physical Exam: Patient remotely monitored without video.  Verbal communication appropriate.  Cognition normal.  No results found for this or any previous visit (from the past 2160 hour(s)).  -------------------------------------------------------------------------- A&P:  Problem List Items Addressed This Visit  Other   Abdominal pain    Based on history, reported symptoms, and previous diagnosis of diverticulitis, likely an acute diverticulitis flare.  Plan: 1) Treating with Ciprofloxacin 250mg  BID x 10 days due to creatinine clearance of 33mL/min as staged by Cockroft-Gault 2) Treating with Metronidazole 500mg  BID x 10 days due to decreased kidney function 3) Sent in Rx for Diflucan for reported yeast infections after antibiotics, take as directed 4) To increase fluids as tolerated 5) Can drink bone broth mixed with gelatin for help with soothing the gut 6) To contact us with any worsening of symptoms, questions/concerns/needs       Other Visit Diagnoses    Diverticulitis    -  Primary   Relevant Medications   ciprofloxacin (CIPRO) 250 MG tablet   metroNIDAZOLE (FLAGYL) 500 MG tablet   Acute vaginitis       Relevant Medications   fluconazole (DIFLUCAN) 150 MG tablet      Meds ordered this encounter  Medications  . ciprofloxacin (CIPRO) 250 MG tablet    Sig: Take 1 tablet (250 mg total) by mouth 2 (two) times daily.    Dispense:  20 tablet    Refill:  0  . metroNIDAZOLE (FLAGYL) 500 MG tablet     Sig: Take 1 tablet (500 mg total) by mouth 2 (two) times daily.    Dispense:  20 tablet    Refill:  0  . fluconazole (DIFLUCAN) 150 MG tablet    Sig: Take 1 tablet now and repeat dose in 72 hours if having continued symptoms.    Dispense:  2 tablet    Refill:  0    Follow-up: - Return as needed if symptoms worsen or not resolving with following current treatment plan  Patient verbalizes understanding with the above medical recommendations including the limitation of remote medical advice.  Specific follow-up and call-back criteria were given for patient to follow-up or seek medical care more urgently if needed.   - Time spent in direct consultation with patient on phone: 7 minutes  Harlin Rain, [facility] 06/21/2019, 9:22 AM

## 2019-06-21 NOTE — Patient Instructions (Addendum)
As we discussed, based on your reported history, symptoms and previous diagnosis, is likely that you have acute diverticulitis.  You are able to tolerate fluids by mouth so should be stable to remain home for treatment.  I have sent in a prescription for Ciprofloxacin to take 1 tablet every 12 hours for the next 10 days and for metronidazole to take 1 tablet every 12 hours for the next 10 days.  Since unable to take NSAIDs with current kidney GFR, can try bone broth mixed with some gelatin (found in the baking aisle at the grocery store) and sip this to help with gut inflammation/repair.  Be sure to take it easy while taking these antibiotics as they have been associated with tendon rupture - please let us know if you begin to have any pain, swelling, inflammation if your feet, ankles, knees, hands, etc.  I have also sent in a prescription for Diflucan, to take as directed, for reported yeast infections after taking antibiotics.  We will plan to see you back in as needed for this, if you have any worsening of symptoms, or no symptom improvement with current treatment plan.  You will receive a survey after today's visit either digitally by e-mail or paper by [employer]. Your experiences and feedback matter to [place].  Please respond so we know how we are doing as we provide care for you.  Call us with any questions/concerns/needs.  It is my goal to be available to you for your health concerns.  Thanks for choosing me to be a partner in your healthcare needs!  Harlin Rain, FNP-C Family Nurse Practitioner [facility] Phone: [phone]

## 2019-06-21 NOTE — Assessment & Plan Note (Signed)
Based on history, reported symptoms, and previous diagnosis of diverticulitis, likely an acute diverticulitis flare.  Plan: 1) Treating with Ciprofloxacin 250mg  BID x 10 days due to creatinine clearance of 81mL/min as staged by Cockroft-Gault 2) Treating with Metronidazole 500mg  BID x 10 days due to decreased kidney function 3) Sent in Rx for Diflucan for reported yeast infections after antibiotics, take as directed 4) To increase fluids as tolerated 5) Can drink bone broth mixed with gelatin for help with soothing the gut 6) To contact us with any worsening of symptoms, questions/concerns/needs

## 2019-08-12 ENCOUNTER — Ambulatory Visit (INDEPENDENT_AMBULATORY_CARE_PROVIDER_SITE_OTHER): Payer: Medicare Other | Admitting: Family Medicine

## 2019-08-12 ENCOUNTER — Encounter: Payer: Self-pay | Admitting: Family Medicine

## 2019-08-12 ENCOUNTER — Other Ambulatory Visit: Payer: Self-pay

## 2019-08-12 VITALS — BP 126/59 | HR 61 | Temp 97.3°F | Resp 16 | Ht 65.0 in | Wt 130.0 lb

## 2019-08-12 DIAGNOSIS — T7840XA Allergy, unspecified, initial encounter: Secondary | ICD-10-CM

## 2019-08-12 DIAGNOSIS — I10 Essential (primary) hypertension: Secondary | ICD-10-CM

## 2019-08-12 NOTE — Patient Instructions (Addendum)
Thank you for coming to the office today.  Discontinue Atenolol now.  Increasing Enalapril 2.5mg  from one a day, now to take 2 instead of 1 - for total dose of 5mg  - If good but not great - still need a little stronger boost for BP. Can always try 3 pills for 7.5 or maximum dose would be 4 pills for 10mg .  Call or message when ready for a new order on Enalapril - let us know which dose you like, either 5mg  or 7.5mg  or 10mg .  Keep track of the allergic reaction if it does not go completely away let us know. Otherwise can take benadryl as needed for itching in evening.  Check BP regularly once a daily would work, can increase if need to check more often.  Last resort would be to try to get a Tier Exception approval for Bystolic   Please schedule a Follow-up Appointment to: Return in about 4 weeks (around 09/09/2019), or if symptoms worsen or fail to improve, for blood pressure.  If you have any other questions or concerns, please feel free to call the office or send a message through [place]. You may also schedule an earlier appointment if necessary.  Additionally, you may be receiving a survey about your experience at our office within a few days to 1 week by e-mail or mail. We value your feedback.  [name], DO [place]

## 2019-08-12 NOTE — Progress Notes (Signed)
Subjective:    [name], female    DOB: 26-Oct-1940, 79 y.o.   MRN: [id number]  Margaret Black is a 79 y.o. female presenting on 08/12/2019 for Rash (onset 2 weeks---patient was advised in 04/2019 visit to DC Bystolic 5mg  and start Start Atenolol 25mg  daily. The changed was made by patient only 2 weeks ago and now noticed Rash.)  PCP Cyndia Skeeters, FNP  HPI   CHRONIC HTN: Reports chronic history in past with difficulty managing her HTN with Cardiologist and Nephrologist in past, she has history of CVA behind eye. She has been managed on prior regimen with good results over past 4 years, back in 04/2019 her insurance stopped covering Bystolic or higher tier cost, so we switched her to Atenolol 25mg  daily instead of Bystolic 5mg  daily. - She had done fairly well, did not actually start this change until about 2 weeks ago. Since that time of the switch she has noticed red urticarial raised itchy rash and generalized itching. Worse last night with acute stressful situation with family/child. Seemed to provoke it more. Rash seems to be left sided face and cheek and neck only, also left arm - Today says she still took dose last night Atenolol, thinks it is only med change. Other than antibiotic cipro/flagyl back in 06/21/19 for diverticulitis. She has dermatology as well. - Current Meds -  Spironolactone 25mg  BID, Atenolol 5mg  daily, Enalapril 2.5mg  daily, Nifedipine XL 60mg  daily - No longer on Amlodipine or Bystolic Reports good compliance, took meds today. Tolerating well, w/o complaints. - Admits generalized itching for past few weeks. Asking if she could take a benadryl worse at night hard time sleeping other night Denies CP, dyspnea, HA, edema, dizziness / lightheadedness  PMH Discoid lupus  Health Maintenance: UTD COVID19 vaccine 03/2019 Pfizer  Depression screen [facility] 2/9 01/06/2019 07/09/2018 12/15/2017  Decreased Interest 0 0 0  Down, Depressed, Hopeless 1 0 0  PHQ - 2 Score 1 0  0  Altered sleeping - 0 -  Tired, decreased energy - 0 -  Change in appetite - 0 -  Feeling bad or failure about yourself  - 0 -  Trouble concentrating - 0 -  Moving slowly or fidgety/restless - 0 -  Suicidal thoughts - 0 -  PHQ-9 Score - 0 -  Difficult doing work/chores - Not difficult at all -  Some recent data might be hidden    Social History   Tobacco Use  . Smoking status: Former Smoker    Types: Cigarettes    Quit date: 07/04/1991    Years since quitting: 28.1  . Smokeless tobacco: Former Systems developer  . Tobacco comment: Quit in early 1980's.  Substance Use Topics  . Alcohol use: No  . Drug use: No    Review of Systems Per HPI unless specifically indicated above     Objective:    BP (!) 126/59   Pulse 61   Temp (!) 97.3 F (36.3 C) (Temporal)   Resp 16   Ht 5\' 5"  (1.651 m)   Wt 130 lb (59 kg)   SpO2 100%   BMI 21.63 kg/m   Wt Readings from Last 3 Encounters:  08/12/19 130 lb (59 kg)  06/07/19 135 lb 3.2 oz (61.3 kg)  04/25/19 132 lb 9.6 oz (60.1 kg)    Physical Exam Vitals and nursing note reviewed.  Constitutional:      General: She is not in acute distress.    Appearance: She is  well-developed. She is not diaphoretic.     Comments: Well-appearing, comfortable, cooperative  HENT:     Head: Normocephalic and atraumatic.  Eyes:     General:        Right eye: No discharge.        Left eye: No discharge.     Conjunctiva/sclera: Conjunctivae normal.  Cardiovascular:     Rate and Rhythm: Normal rate.  Pulmonary:     Effort: Pulmonary effort is normal.  Skin:    General: Skin is warm and dry.     Findings: Rash (red raised urticarial rash distinct patches left face, ear lobe, neck, and left arm) present. No erythema.  Neurological:     Mental Status: She is alert and oriented to person, place, and time.  Psychiatric:        Behavior: Behavior normal.     Comments: Well groomed, good eye contact, normal speech and thoughts    Results for orders placed  or performed in visit on 08/02/18  Thyroglobulin antibody  Result Value Ref Range   Thyroglobulin Ab 1 < or = 1 IU/mL  CBC with Differential/Platelet  Result Value Ref Range   WBC 6.9 3.8 - 10.8 Thousand/uL   RBC 3.87 3.80 - 5.10 Million/uL   Hemoglobin 12.3 11.7 - 15.5 g/dL   HCT 36.9 35.0 - 45.0 %   MCV 95.3 80.0 - 100.0 fL   MCH 31.8 27.0 - 33.0 pg   MCHC 33.3 32.0 - 36.0 g/dL   RDW 12.3 11.0 - 15.0 %   Platelets 323 140 - 400 Thousand/uL   MPV 10.3 7.5 - 12.5 fL   Neutro Abs 4,775 1,500 - 7,800 cells/uL   Lymphs Abs 1,125 850 - 3,900 cells/uL   Absolute Monocytes 559 200 - 950 cells/uL   Eosinophils Absolute 359 15 - 500 cells/uL   Basophils Absolute 83 0 - 200 cells/uL   Neutrophils Relative % 69.2 %   Total Lymphocyte 16.3 %   Monocytes Relative 8.1 %   Eosinophils Relative 5.2 %   Basophils Relative 1.2 %  Thyroglobulin Level  Result Value Ref Range   Thyroglobulin <0.1 (L) ng/mL   Comment    T4, free  Result Value Ref Range   Free T4 1.6 0.8 - 1.8 ng/dL  TSH  Result Value Ref Range   TSH 1.92 0.40 - 4.50 mIU/L  COMPLETE METABOLIC PANEL WITH GFR  Result Value Ref Range   Glucose, Bld 101 (H) 65 - 99 mg/dL   BUN 23 7 - 25 mg/dL   Creat 1.36 (H) 0.60 - 0.93 mg/dL   GFR, Est Non African American 37 (L) > OR = 60 mL/min/1.30m2   GFR, Est African American 43 (L) > OR = 60 mL/min/1.21m2   BUN/Creatinine Ratio 17 6 - 22 (calc)   Sodium 142 135 - 146 mmol/L   Potassium 5.3 3.5 - 5.3 mmol/L   Chloride 105 98 - 110 mmol/L   CO2 27 20 - 32 mmol/L   Calcium 10.2 8.6 - 10.4 mg/dL   Total Protein 7.1 6.1 - 8.1 g/dL   Albumin 4.1 3.6 - 5.1 g/dL   Globulin 3.0 1.9 - 3.7 g/dL (calc)   AG Ratio 1.4 1.0 - 2.5 (calc)   Total Bilirubin 0.8 0.2 - 1.2 mg/dL   Alkaline phosphatase (APISO) 87 37 - 153 U/L   AST 18 10 - 35 U/L   ALT 16 6 - 29 U/L      Assessment & Plan:  Problem List Items Addressed This Visit    Essential hypertension - Primary    Other Visit Diagnoses     Allergic reaction to drug, initial encounter         #Suspected acute allergic due to medication HTN  Likely trigger Atenolol based on timing, onset 2 weeks, generalized itching but now with rash Last dose last night DC likely agent, stop Atenolol tonight.  Increase Enalapril from 2.5mg  daily up to 5mg  daily - take 2 instead of 1 tab, no new rx yet, she will notify us when ready for new dosage preferred. May even slightly increase further up to 7.5 or 10mg  in future if needed. Agree to avoid new medication today. See if allergic response improves. Can take Benadryl overnight OTC PRN for itching and rash if need.  Reviewed BP recommendations, check BP daily, more if abnormal readings, goal < 140 consistently  Consider submit tier exception request for Bystolic 5mg  in future  No orders of the defined types were placed in this encounter.     Follow up plan: Return in about 4 weeks (around 09/09/2019), or if symptoms worsen or fail to improve, for blood pressure.   [name], DO [facility] 08/12/2019, 9:54 AM

## 2019-08-23 ENCOUNTER — Other Ambulatory Visit: Payer: Self-pay

## 2019-08-23 ENCOUNTER — Ambulatory Visit (INDEPENDENT_AMBULATORY_CARE_PROVIDER_SITE_OTHER): Payer: Medicare Other | Admitting: Family Medicine

## 2019-08-23 ENCOUNTER — Encounter: Payer: Self-pay | Admitting: Family Medicine

## 2019-08-23 VITALS — BP 116/58 | HR 60 | Ht 65.0 in | Wt 130.0 lb

## 2019-08-23 DIAGNOSIS — I129 Hypertensive chronic kidney disease with stage 1 through stage 4 chronic kidney disease, or unspecified chronic kidney disease: Secondary | ICD-10-CM | POA: Diagnosis not present

## 2019-08-23 DIAGNOSIS — N183 Chronic kidney disease, stage 3 unspecified: Secondary | ICD-10-CM | POA: Diagnosis not present

## 2019-08-23 DIAGNOSIS — F5104 Psychophysiologic insomnia: Secondary | ICD-10-CM | POA: Diagnosis not present

## 2019-08-23 DIAGNOSIS — I6523 Occlusion and stenosis of bilateral carotid arteries: Secondary | ICD-10-CM

## 2019-08-23 MED ORDER — BYSTOLIC 5 MG PO TABS: 5.0000 mg | ORAL_TABLET | Freq: Every day | ORAL | 3 refills | Status: AC

## 2019-08-23 NOTE — Progress Notes (Signed)
Virtual Visit via Telephone The purpose of this virtual visit is to provide medical care while limiting exposure to the novel coronavirus (COVID19) for both patient and office staff.  Consent was obtained for phone visit:  Yes.   Answered questions that patient had about telehealth interaction:  Yes.   I discussed the limitations, risks, security and privacy concerns of performing an evaluation and management service by telephone. I also discussed with the patient that there may be a patient responsible charge related to this service. The patient expressed understanding and agreed to proceed.  Patient Location: Home Provider Location: [facility])  ---------------------------------------------------------------------- Chief Complaint  Patient presents with  . Hypertension    medication change Saturday 185/94----taken 3 Enalapril also getting palpitation   Previous PCP Cassell Smiles, AGPCNP-BC. She has since seen myself and also Cyndia Skeeters, FNP, however she will plan to transition her care to me going forward.  S: Reviewed CMA documentation. I have called patient and gathered additional HPI as follows:  CHRONIC HTN: - Last visit with me on 08/12/19, for follow up of same issue with HTN uncontrolled. This problem ultimately started in 04/2019 when her insurance changed Tier of Bystolic she had been stable on, then had worsening with control of BP on current meds. She was started on Atenolol at that time. However she had enough Bystolic for 2-3 months, she tried Atenolol for short period and ultimately had reaction to it. It was discontinued 08/12/19 and she was increased on Enalapril from 2.5 up to 5mg . At that time. - She has significant past history of CAD, PAD with carotid stenosis, she had history of Left eye central retinal vein occlusion. She lost vision in Left eye. She saw Cardiology and Nephrology in past for BP difficult to manage. She has ophthalmology as  well. - Today she reports concerns with Enalapril may not be controlling BP as she would like. She has had episodic raise in BP and palpitations. She had issues sleeping due to palpitations.  - Current changes. Thursday last week. BP reading 140-150. Then out of blue this past weekend she had acute BP readings up to SBP 190. Since then she has taken double BP med with the Enalapril. Her BP - Her home BP 116/60, doing well, then it did increase up to 145/73 - She is currently taking Enalapril 2.5mg  x 3 tabs now = 7.5mg  daily with some improvement but she is very concerned on her Bp overall due to history of eye occlusion. - She still sees Nephrology Dr Holley Raring last seen 08/12/19. They were good with the plan she was on previously and did not make any changes but her BP was good at that time. - She no longer sees Cardiologist - Current Meds -Spironolactone 25mg  BID, Atenolol 5mg  daily, Enalapril 2.5mg  daily, Nifedipine XL 60mg  daily - No longer on Amlodipine or Bystolic Denies CP, dyspnea, HA, edema, dizziness / lightheadedness  [facility] Tier 2, and price point Tier 4 - Tier exception - [place] dept  Insomnia / Anxiety Additional topic today. She has history of anxiety and insomnia was taking Lorazepam as needed, 0.5mg  nightly PRN. Not regularly, she has self weaned down on this med, and has come down from 1 whole tablet to half tab to intermittent to off. Has not had side effect or withdrawal symptoms off med. But did admit anxious recently with this BP issue   Past Medical History:  Diagnosis Date  . Anxiety    Related to HTN  and vision loss  . Central retinal vein occlusion of left eye    legally blind in Left eye  . 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 Collagen vascular disease ([place])    uncharacterized.  has seen rheum.  positive RF, positive serologies for Sjogrens.  C-ANCA/P-ANCA neg, ANA  neg, anti-dsDNA neg, anti-SCL70 neg,  anti-centromere Ab neg  . COPD (chronic obstructive pulmonary disease) ([place])    quit smoking 1993  . Diverticulosis   . Dysfunction of eustachian tube   . Fatigue   . GERD (gastroesophageal reflux disease)   . Goiter, unspecified   . HLD (hyperlipidemia)   . HTN (hypertension)    difficult to control, hypertensive urgency hospitalizations, normal urine/plasma metanephrines and catecholamines, renal dopplers without stenosis  . Hyperaldosteronism    serum aldo/renin ration elevated (aldo 23.7, renin <0.15), CT abd 2010 with normal adrenals, dedicated adrenal CT without adrenal adenoma,   . Hypothyroid   . Insomnia    Related to HTN and vision loss  . Irritable bowel syndrome   . Leiomyosarcoma of uterus ([place]) 09/22/2014  . Lung cancer ([place])    s/p L lobectomy 2001  . Lupus ([place])   . Osteopenia   . Pulmonary hypertension ([place])    echo [facility] 12/2008 with EF >5%, grade II diastolic dysfunction, RSVP >39mmHg, ACE level normal.  f/u echo at [facility] - EF 55-60%, mild MR, mild LAE, PASP 25 + RA (no pulm HTN)  . Urge incontinence   . Uterine cancer [facility])    s/p hysterectomy 2001   Social History   Tobacco Use  . Smoking status: Former Smoker    Types: Cigarettes    Quit date: 07/04/1991    Years since quitting: 28.1  . Smokeless tobacco: Former Systems developer  . Tobacco comment: Quit in early 1980's.  Substance Use Topics  . Alcohol use: No  . Drug use: No    Current Outpatient Medications:  .  aspirin 81 MG tablet, Take 81 mg by mouth daily., Disp: , Rfl:  .  CALCIUM-VITAMIN D PO, Take by mouth daily., Disp: , Rfl:  .  enalapril (VASOTEC) 2.5 MG tablet, Take 5 mg by mouth daily. Taking 3 tablets of 2.5 mg, Disp: , Rfl:  .  Multiple Vitamin (MULTIVITAMIN) tablet, Take 1 tablet by mouth daily., Disp: , Rfl:  .  NIFEdipine (NIFEDICAL XL) 60 MG 24 hr tablet, TAKE 1 TABLET EVERY EVENING, Disp: 90 tablet, Rfl: 1 .  spironolactone (ALDACTONE) 25 MG tablet, Take 1 tablet (25 mg total) by mouth 2  (two) times daily., Disp: 180 tablet, Rfl: 1 .  SYNTHROID 88 MCG tablet, Take 1 tablet (88 mcg total) by mouth daily before breakfast., Disp: 90 tablet, Rfl: 1 .  vitamin C (ASCORBIC ACID) 500 MG tablet, Take 500 mg by mouth daily., Disp: , Rfl:  .  Aspirin-Calcium Carbonate 81-777 MG TABS, Take by mouth., Disp: , Rfl:  .  BYSTOLIC 5 MG tablet, Take 1 tablet (5 mg total) by mouth daily., Disp: 90 tablet, Rfl: 3 .  LORazepam (ATIVAN) 0.5 MG tablet, Take 1 tablet (0.5 mg total) by mouth at bedtime as needed for anxiety or sleep. (Patient not taking: Reported on 08/23/2019), Disp: 30 tablet, Rfl: 5  Depression screen [facility] 2/9 08/23/2019 01/06/2019 07/09/2018  Decreased Interest 0 0 0  Down, Depressed, Hopeless 0 1 0  PHQ - 2 Score 0 1 0  Altered sleeping - - 0  Tired, decreased  energy - - 0  Change in appetite - - 0  Feeling bad or failure about yourself  - - 0  Trouble concentrating - - 0  Moving slowly or fidgety/restless - - 0  Suicidal thoughts - - 0  PHQ-9 Score - - 0  Difficult doing work/chores - - Not difficult at all  Some recent data might be hidden    GAD 7 : Generalized Anxiety Score 07/09/2018 07/01/2016  Nervous, Anxious, on Edge 1 0  Control/stop worrying 0 1  Worry too much - different things 0 0  Trouble relaxing 0 0  Restless 0 0  Easily annoyed or irritable 0 1  Afraid - awful might happen 0 0  Total GAD 7 Score 1 2  Anxiety Difficulty - Not difficult at all    -------------------------------------------------------------------------- O: No physical exam performed due to remote telephone encounter.  Lab results reviewed.  No results found for this or any previous visit (from the past 2160 hour(s)).  -------------------------------------------------------------------------- A&P:  Problem List Items Addressed This Visit    Insomnia    Stable to improved now off regular dosing Lorazepam 0.5mg  nightly PRN Has improved anxiety overall. Not having withdrawal from BDZ  at this time. Discussed this may have contributed to her acute elevated BP but based on history seems less likely      Carotid stenosis    History of bilateral mild to moderate stenosis < 50%      Relevant Medications   BYSTOLIC 5 MG tablet   Other Relevant Orders   Ambulatory referral to Cardiology   Benign hypertension with CKD (chronic kidney disease) stage III - Primary    BP is improved on higher dose Enalapril up to 7.5mg  now, on other med regimen. However concern with still having episodic elevated BP at times recently had acute elevation palpitations - Home BP readings reviewed  Complication with CKD III Followed by [facility] Dr Holley Raring Nephrology History of L central retinal vein occlusion vision loss   Plan:  1. Will attempt to re-order Bystolic 5mg  daily as this worked best for patient, even though she does have a mild low baseline HR. Will call her [facility] and work on Tier exception - ultimately unsuccessful - called and got a verbal denial after physician review, for lowering Tier, it is already at lowest cost sharing price for Tier 4 pricing for a Tier 2 med is what I am told, it was denied 2. Would keep Enalapril 7.5mg  daily for now x 3 pills - she may consider paying higher price for Bystolic or in future maybe generic would be good option - Keep other HTN regimen the same - Spironolactone 25mg  BID, Nifedipine XL 60mg  2. Encourage improved lifestyle - low sodium diet, regular exercise 3. Continue monitor BP outside office, bring readings to next visit, if persistently >140/90 or new symptoms notify office sooner  Referral to [facility] for evaluation as well given history of CAD, PAD, and labile BP recently since BP med change off Bystolic. She already has Nephrology asked her to keep updating them and follow-up as needed.  Called her back, sounds like previous quote was $100 a month or $300 for 3 months, for now she will call them to clarify and DC  med if price is too high or appeal.      Relevant Medications   BYSTOLIC 5 MG tablet   Other Relevant Orders   Ambulatory referral to Cardiology     Orders Placed This Encounter  Procedures  . Ambulatory referral to Cardiology    Referral Priority:   Routine    Referral Type:   Consultation    Referral Reason:   Specialty Services Required    Requested Specialty:   Cardiology    Number of Visits Requested:   1     Meds ordered this encounter  Medications  . BYSTOLIC 5 MG tablet    Sig: Take 1 tablet (5 mg total) by mouth daily.    Dispense:  90 tablet    Refill:  3    Requesting Lower Tier exception    Follow-up: - Return in 1-3 months as needed  Patient verbalizes understanding with the above medical recommendations including the limitation of remote medical advice.  Specific follow-up and call-back criteria were given for patient to follow-up or seek medical care more urgently if needed.   - Time spent in direct consultation with patient on phone: 21 minutes  [name], [facility] 08/23/2019, 4:10 PM

## 2019-08-23 NOTE — Assessment & Plan Note (Signed)
History of bilateral mild to moderate stenosis < 50%

## 2019-08-23 NOTE — Assessment & Plan Note (Addendum)
Stable to improved now off regular dosing Lorazepam 0.5mg  nightly PRN Has improved anxiety overall. Not having withdrawal from BDZ at this time. Discussed this may have contributed to her acute elevated BP but based on history seems less likely

## 2019-08-23 NOTE — Assessment & Plan Note (Addendum)
BP is improved on higher dose Enalapril up to 7.5mg  now, on other med regimen. However concern with still having episodic elevated BP at times recently had acute elevation palpitations - Home BP readings reviewed  Complication with CKD III Followed by [facility] Dr Holley Raring Nephrology History of L central retinal vein occlusion vision loss   Plan:  1. Will attempt to re-order Bystolic 5mg  daily as this worked best for patient, even though she does have a mild low baseline HR. Will call her [facility] and work on Tier exception - ultimately unsuccessful - called and got a verbal denial after physician review, for lowering Tier, it is already at lowest cost sharing price for Tier 4 pricing for a Tier 2 med is what I am told, it was denied 2. Would keep Enalapril 7.5mg  daily for now x 3 pills - she may consider paying higher price for Bystolic or in future maybe generic would be good option - Keep other HTN regimen the same - Spironolactone 25mg  BID, Nifedipine XL 60mg  2. Encourage improved lifestyle - low sodium diet, regular exercise 3. Continue monitor BP outside office, bring readings to next visit, if persistently >140/90 or new symptoms notify office sooner  Referral to [place] for evaluation as well given history of CAD, PAD, and labile BP recently since BP med change off Bystolic. She already has Nephrology asked her to keep updating them and follow-up as needed.  Called her back, sounds like previous quote was $100 a month or $300 for 3 months, for now she will call them to clarify and DC med if price is too high or appeal.

## 2019-08-23 NOTE — Patient Instructions (Addendum)
Unable to get Bystolic at lower tier May be generic in future Keep on enalapril 2.5mg  x 3 for now call if need anything else  [facility]) [facility]Care at [place], [place] [address] Main: [phone]   Referral in to Cardiology stay tuned.  Please schedule a Follow-up Appointment to: Return in about 3 months (around 11/23/2019) for HTN.  If you have any other questions or concerns, please feel free to call the office or send a message through [place]. You may also schedule an earlier appointment if necessary.  Additionally, you may be receiving a survey about your experience at our office within a few days to 1 week by e-mail or mail. We value your feedback.  [name], DO [place]

## 2019-08-25 ENCOUNTER — Telehealth: Payer: Self-pay | Admitting: Family Medicine

## 2019-08-25 DIAGNOSIS — N183 Chronic kidney disease, stage 3 unspecified: Secondary | ICD-10-CM

## 2019-08-25 MED ORDER — BYSTOLIC 5 MG PO TABS: 5.0000 mg | ORAL_TABLET | Freq: Every day | ORAL | 0 refills | Status: DC

## 2019-08-25 NOTE — Telephone Encounter (Signed)
Sent 7 day Bystolic 5mg  to [place], [place] [facility] 08/25/2019, 11:34 AM

## 2019-08-25 NOTE — Telephone Encounter (Signed)
Patient will notified by Apolonio Schneiders.

## 2019-08-25 NOTE — Telephone Encounter (Signed)
Pt is requesting a 7 day supply Bystolic 5 MG called into  [facility].... pt said that she was waiting on  Mail order (which had not mailed out  Medication) Per  Pt  Ins. Will approve 7 days only at local Drug  Store.

## 2019-09-01 ENCOUNTER — Telehealth: Payer: Self-pay

## 2019-09-01 NOTE — Telephone Encounter (Signed)
I did receive the fax. It was information on the denial and had a form for the Appeal for the patient to submit her appeal and her concerns. There was not a section for her healthcare provider to complete. It said that she could include a letter from her doctor if she wanted. But I will have to talk with the patient first to see what she wanted to do, as I have already completed a verbal appeal that was denied on the phone last week with [facility]. I can try calling them back at this number when they are open. I am confused as to what they need next. Patient has already purchased medication and is taking it.  [name], [facility] 09/01/2019, 4:23 PM

## 2019-09-01 NOTE — Telephone Encounter (Signed)
Pam wanted to know if [facility] receive fax for denial, informed her not recently but something was received last week for Bystolic.

## 2019-09-01 NOTE — Telephone Encounter (Signed)
Copied from [place] [id number]. Topic: General - Call Back - No Documentation >> Sep 01, 2019 12:18 PM Erick Blinks wrote: Margaret Black from [employer] [facility] called to follow up on fax that was sent (08/30/2019) She is a case worker  Best contact: [phone] direct extension [phone] (M-F 7am-3:30 EST)

## 2019-09-02 NOTE — Telephone Encounter (Signed)
I called Margaret Black today after hours. Discussed this situation. Ultimately she had issues getting the med filled recently and they kept asking her to re-submit the 7 day rx, eventually she got the full shipment of medicine and is doing well now. She does have to pay a higher tier price on it but overall is very happy to be back on the med and doing well.  She said that she did not file any new appeal. She complained to them. But they did not have any record of my Tier Exception request that I submitted verbally through a clinical peer to peer phone call with a Case # of [id number] about a week ago.  Ultimately we both agreed to not pursue any further changes or requests at this time. She will take the med and follow up in future. We can consider a new request or appeal in the future if needed or perhaps it may become generic and cost will lower.  I can try to talk with [facility] in future if there is any other issue that comes up. Otherwise, no further actions needed now.  [name], [facility] 09/02/2019, 6:51 PM

## 2019-09-15 ENCOUNTER — Telehealth: Payer: Self-pay | Admitting: Family Medicine

## 2019-09-15 ENCOUNTER — Other Ambulatory Visit: Payer: Self-pay | Admitting: Family Medicine

## 2019-09-15 DIAGNOSIS — F5104 Psychophysiologic insomnia: Secondary | ICD-10-CM

## 2019-09-15 DIAGNOSIS — I10 Essential (primary) hypertension: Secondary | ICD-10-CM

## 2019-09-15 DIAGNOSIS — E890 Postprocedural hypothyroidism: Secondary | ICD-10-CM

## 2019-09-15 MED ORDER — SYNTHROID 88 MCG PO TABS: 88.0000 ug | ORAL_TABLET | Freq: Every day | ORAL | 3 refills | Status: AC

## 2019-09-15 MED ORDER — ENALAPRIL MALEATE 2.5 MG PO TABS: 2.5000 mg | ORAL_TABLET | Freq: Every day | ORAL | 3 refills | Status: DC

## 2019-09-15 NOTE — Telephone Encounter (Signed)
enalapril (VASOTEC) 2.5 MG tablet  SYNTHROID 88 MCG tablet  [place], [place] Phone:  [phone]  Fax:  [phone]     PT states that she wants a FU stating that this has been sent in as that her medications are not received in a timely manner by [facility]. Pt was told that she could verify with [facility] but she stated that she had rather work with Apolonio Schneiders. Pt was assured that we would take care of this in a timely fashion.

## 2019-09-15 NOTE — Telephone Encounter (Signed)
Requested medication (s) are due for refill today: Enalapril, yes  Requested medication (s) are on the active medication list: yes  Last refill:  ?  Future visit scheduled: no  Notes to clinic:  historical provider  Requested medication (s) are due for refill today: Synthroid, yes  Requested medication (s) are on the active medication list: yes  Last refill: 01/06/2019  Future visit scheduled: no  Notes to clinic:  insurance request alternate medication; see pt note in patient calls  Requested Prescriptions  Pending Prescriptions Disp Refills   enalapril (VASOTEC) 2.5 MG tablet      Sig: Take 1 tablet (2.5 mg total) by mouth daily. Taking 1 tablet of 2.5 mg (reduced dose back to 2.5 start on 08/24/19)      Cardiovascular:  ACE Inhibitors Failed - 09/15/2019  8:41 AM      Failed - Cr in normal range and within 180 days    Creat  Date Value Ref Range Status  08/02/2018 1.36 (H) 0.60 - 0.93 mg/dL Final    Comment:    For patients >24 years of age, the reference limit for Creatinine is approximately 13% higher for people identified as African-American. .    Creatinine,U  Date Value Ref Range Status  03/30/2014 81.9 mg/dL Final          Failed - K in normal range and within 180 days    Potassium  Date Value Ref Range Status  08/02/2018 5.3 3.5 - 5.3 mmol/L Final          Passed - Patient is not pregnant      Passed - Last BP in normal range    BP Readings from Last 1 Encounters:  08/23/19 (!) 116/58          Passed - Valid encounter within last 6 months    Recent Outpatient Visits           3 weeks ago Benign hypertension with CKD (chronic kidney disease) stage III   [place], Devonne Doughty, DO   1 month ago Essential hypertension   [facility] [place], Devonne Doughty, DO   2 months ago Diverticulitis   [place], FNP   3 months ago [facility] [place],  Lupita Raider, FNP   4 months ago Gastroesophageal reflux disease, unspecified whether esophagitis present   [place], DO       Future Appointments             In 2 weeks End, Harrell Gave, MD [facility], [facility]              SYNTHROID 88 MCG tablet 90 tablet 1    Sig: Take 1 tablet (88 mcg total) by mouth daily before breakfast.      Endocrinology:  Hypothyroid Agents Failed - 09/15/2019  8:41 AM      Failed - TSH needs to be rechecked within 3 months after an abnormal result. Refill until TSH is due.      Failed - TSH in normal range and within 360 days    TSH  Date Value Ref Range Status  08/02/2018 1.92 0.40 - 4.50 mIU/L Final          Passed - Valid encounter within last 12 months    Recent Outpatient Visits           3 weeks ago Benign hypertension with  CKD (chronic kidney disease) stage III   [place], DO   1 month ago Essential hypertension   [place], DO   2 months ago Diverticulitis   [place], FNP   3 months ago [facility] [place], Lupita Raider, FNP   4 months ago Gastroesophageal reflux disease, unspecified whether esophagitis present   [place], DO       Future Appointments             In 2 weeks End, Harrell Gave, MD [facility], [place]

## 2019-09-15 NOTE — Telephone Encounter (Signed)
This is a duplicate request I believe. I sent this rx earlier today.  Here is copy. It was sent DAW for brand name only.  SYNTHROID 88 MCG tablet [[id number]]   Order Details Dose: 88 mcg Route: Oral Frequency: Daily before breakfast  Dispense Quantity: 90 tablet Refills: 3       Sig: Take 1 tablet (88 mcg total) by mouth daily before breakfast.      Start Date: 09/15/19 End Date: --  Written Date: 09/15/19 Expiration Date: 09/14/20    Diagnosis Association: Postsurgical hypothyroidism (E89.0)  Original Order:  SYNTHROID 88 MCG tablet [[id number]]  Providers  Authorizing Provider: Olin Hauser, DO NPI: [id number]  DEA #: [id number]  Ordering User:  Olin Hauser, [place], [place]  [address] [place] [address]  Phone:  [phone] Fax:  [phone]  DEA #:  --    [name], DO [facility] 09/15/2019, 5:29 PM

## 2019-09-15 NOTE — Telephone Encounter (Signed)
Patient states that [facility] needs new RX sent for Synthroid 88 MCG tablet. It specifically needs to be sent "as written", as patient is unable to take generic.    [place], [place] Phone:  [phone]  Fax:  [phone]

## 2019-09-22 NOTE — Addendum Note (Signed)
Addended by: Olin Hauser on: 09/22/2019 06:39 PM   Modules accepted: Orders

## 2019-10-05 ENCOUNTER — Ambulatory Visit: Payer: Medicare Other | Admitting: Internal Medicine

## 2019-10-20 ENCOUNTER — Telehealth: Payer: Self-pay | Admitting: Family Medicine

## 2019-10-20 NOTE — Telephone Encounter (Signed)
Medication Refill - Medication: Lorazepam  Has the patient contacted their pharmacy? Yes.   (Agent: If no, request that the patient contact the pharmacy for the refill.) (Agent: If yes, when and what did the pharmacy advise?)  Preferred Pharmacy (with phone number or street name): [facility]  Agent: Please be advised that RX refills may take up to 3 business days. We ask that you follow-up with your pharmacy.

## 2019-10-24 ENCOUNTER — Other Ambulatory Visit: Payer: Self-pay

## 2019-10-24 ENCOUNTER — Telehealth (INDEPENDENT_AMBULATORY_CARE_PROVIDER_SITE_OTHER): Payer: Medicare Other | Admitting: Family Medicine

## 2019-10-24 ENCOUNTER — Encounter: Payer: Self-pay | Admitting: Family Medicine

## 2019-10-24 VITALS — BP 113/49 | HR 72 | Temp 98.5°F | Resp 16 | Ht 65.0 in | Wt 133.0 lb

## 2019-10-24 DIAGNOSIS — J0100 Acute maxillary sinusitis, unspecified: Secondary | ICD-10-CM | POA: Diagnosis not present

## 2019-10-24 DIAGNOSIS — I700 Atherosclerosis of aorta: Secondary | ICD-10-CM | POA: Diagnosis not present

## 2019-10-24 DIAGNOSIS — R918 Other nonspecific abnormal finding of lung field: Secondary | ICD-10-CM

## 2019-10-24 DIAGNOSIS — J441 Chronic obstructive pulmonary disease with (acute) exacerbation: Secondary | ICD-10-CM

## 2019-10-24 DIAGNOSIS — J432 Centrilobular emphysema: Secondary | ICD-10-CM | POA: Insufficient documentation

## 2019-10-24 DIAGNOSIS — Z85118 Personal history of other malignant neoplasm of bronchus and lung: Secondary | ICD-10-CM

## 2019-10-24 MED ORDER — PREDNISONE 50 MG PO TABS: 50.0000 mg | ORAL_TABLET | Freq: Every day | ORAL | 0 refills | Status: DC

## 2019-10-24 MED ORDER — ALBUTEROL SULFATE HFA 108 (90 BASE) MCG/ACT IN AERS: 2.0000 | INHALATION_SPRAY | RESPIRATORY_TRACT | 2 refills | Status: DC | PRN

## 2019-10-24 MED ORDER — LEVOFLOXACIN 500 MG PO TABS: 500.0000 mg | ORAL_TABLET | Freq: Every day | ORAL | 0 refills | Status: DC

## 2019-10-24 NOTE — Assessment & Plan Note (Signed)
Identified on imaging CT, last 2018 Due for repeat imaging On ASA 81mg  daily Not on statin therapy

## 2019-10-24 NOTE — Progress Notes (Signed)
Subjective:    [name], female    DOB: [birth year], 79 y.o.   MRN: [id number]  Margaret Black is a 79 y.o. female presenting on 10/24/2019 for Cough (sinusitis, fever din't knw, throat hurts onset 3 weeks)   HPI   COPD EXACERBATION / SINUSITIS / Centrilobular Emphysema  History of Lung Cancer, Right Lobe S/p partial lobectomy R side, 04/1999 Former smoker, quit in 1993 History of benign lesions in left lobe, has had pulmonary nodules followed in past by CT imaging, last 2018 Today reports new concerns with sinus for 3 weeks with congestion and drainage affecting her and now causing worsening cough, worse at night and having cough spells, old albuterol PRN, needs new order now. In past has used antibiotics and prednisone, has had some side effect on these but is interested in resolving this problem promptly. - She no longer sees anyone for CT scans of nodules Admits cough, dyspnea at times Denies nausea vomiting, loss of taste smell, hemoptysis  Health Maintenance: UTD COVID19 vaccine.  Depression screen [facility] 2/9 08/23/2019 01/06/2019 07/09/2018  Decreased Interest 0 0 0  Down, Depressed, Hopeless 0 1 0  PHQ - 2 Score 0 1 0  Altered sleeping - - 0  Tired, decreased energy - - 0  Change in appetite - - 0  Feeling bad or failure about yourself  - - 0  Trouble concentrating - - 0  Moving slowly or fidgety/restless - - 0  Suicidal thoughts - - 0  PHQ-9 Score - - 0  Difficult doing work/chores - - Not difficult at all  Some recent data might be hidden   Past Surgical History:  Procedure Laterality Date  . ABDOMINAL HYSTERECTOMY  06/1999  . APPENDECTOMY  1958  . CHOLECYSTECTOMY  1982  . CT ABD W & PELVIS WO CM  2010   neg except diverticulosis  . CT chest  2007   enlarged thyroid with goiter  . LOBECTOMY  04/1999   right lung  . THYROIDECTOMY    . TUBAL LIGATION  1975     Social History   Tobacco Use  . Smoking status: Former Smoker    Types: Cigarettes     Quit date: 07/04/1991    Years since quitting: 28.3  . Smokeless tobacco: Former Systems developer  . Tobacco comment: Quit in early 1980's.  Vaping Use  . Vaping Use: Never used  Substance Use Topics  . Alcohol use: No  . Drug use: No    Review of Systems Per HPI unless specifically indicated above     Objective:    BP (!) 113/49   Pulse 72   Temp 98.5 F (36.9 C) (Temporal)   Resp 16   Ht 5\' 5"  (1.651 m)   Wt 133 lb (60.3 kg)   SpO2 98%   BMI 22.13 kg/m   Wt Readings from Last 3 Encounters:  10/24/19 133 lb (60.3 kg)  08/23/19 130 lb (59 kg)  08/12/19 130 lb (59 kg)    Physical Exam Vitals and nursing note reviewed.  Constitutional:      General: She is not in acute distress.    Appearance: She is well-developed. She is not diaphoretic.     Comments: Well-appearing, comfortable, cooperative  HENT:     Head: Normocephalic and atraumatic.  Eyes:     General:        Right eye: No discharge.        Left eye: No discharge.  Conjunctiva/sclera: Conjunctivae normal.  Neck:     Thyroid: No thyromegaly.  Cardiovascular:     Rate and Rhythm: Normal rate and regular rhythm.     Heart sounds: Normal heart sounds. No murmur heard.   Pulmonary:     Effort: Pulmonary effort is normal. No respiratory distress.     Breath sounds: Wheezing present. No rales.     Comments: Diffuse non high pitched coarse breath sounds consistent with wheezing bilateral, frequent coughing. Musculoskeletal:        General: Normal range of motion.     Cervical back: Normal range of motion and neck supple.  Lymphadenopathy:     Cervical: No cervical adenopathy.  Skin:    General: Skin is warm and dry.     Findings: No erythema or rash.  Neurological:     Mental Status: She is alert and oriented to person, place, and time.  Psychiatric:        Behavior: Behavior normal.     Comments: Well groomed, good eye contact, normal speech and thoughts      EXAM: CT CHEST WITHOUT  CONTRAST  TECHNIQUE: Multidetector CT imaging of the chest was performed following the standard protocol without IV contrast.  COMPARISON:  03/01/2015  FINDINGS: Cardiovascular: Atherosclerotic calcifications of the thoracic aorta are noted without aneurysmal dilatation. Coronary calcifications are seen. No significant cardiac enlargement is noted.  Mediastinum/Nodes: The thoracic inlet is within normal limits. No significant hilar or mediastinal adenopathy is noted. Prominent pericardial reflection is noted superiorly. The esophagus as visualized is within normal limits.  Lungs/Pleura: Emphysematous changes are again identified. Areas of chronic scarring are noted in the medial right apex stable from the prior exam. Stable postoperative changes are again noted on the right. Few scattered small subpleural nodules are noted on the right stable from the prior exam.  In the left lower lobe, there is an 11 mm nodular density stable from the prior exam. Some scarring is noted in the left lower lobe as well as mild pleural calcifications. These are stable when compared with the prior exam. Stable less than 5 mm nodules are noted in the left lower lobe best seen on image number 91 of series 3. Ground-glass density upper portion of the lower lobe on the left is mildly improved when compared with the prior exam. Some persistent subpleural nodular changes are seen best noted on image number are 57 of series 3. The left upper lobe nodule seen previously has increased somewhat in size and bulk now measuring 8 mm.  Upper Abdomen: Visualized upper abdomen demonstrates changes of prior cholecystectomy. Hypodensity is again noted within the right lobe of the liver likely representing a cyst.  Musculoskeletal: Degenerative changes of the thoracic spine are noted. No acute bony abnormality is seen.  IMPRESSION: Multiple bilateral nodules and ground-glass densities similar to that  seen on the prior exam.  The left upper lobe nodule appears somewhat more bulky and slightly increased in size when compared with the prior exam although this may be a function of thinner slices. Short-term follow-up in 3-6 months is recommended with thin slice (38mm) sections.  Postsurgical and post radiation changes in the right lung.  Aortic Atherosclerosis (ICD10-I70.0) and Emphysema (ICD10-J43.9).  These results will be called to the ordering clinician or representative by the Radiologist Assistant, and communication documented in the PACS or zVision Dashboard.   Electronically Signed   By: Inez Catalina M.D.   On: 10/27/2016 16:06  Results for orders placed or performed  in visit on 08/02/18  Thyroglobulin antibody  Result Value Ref Range   Thyroglobulin Ab 1 < or = 1 IU/mL  CBC with Differential/Platelet  Result Value Ref Range   WBC 6.9 3.8 - 10.8 Thousand/uL   RBC 3.87 3.80 - 5.10 Million/uL   Hemoglobin 12.3 11.7 - 15.5 g/dL   HCT 36.9 35 - 45 %   MCV 95.3 80.0 - 100.0 fL   MCH 31.8 27.0 - 33.0 pg   MCHC 33.3 32.0 - 36.0 g/dL   RDW 12.3 11.0 - 15.0 %   Platelets 323 140 - 400 Thousand/uL   MPV 10.3 7.5 - 12.5 fL   Neutro Abs 4,775 1,500 - 7,800 cells/uL   Lymphs Abs 1,125 850 - 3,900 cells/uL   Absolute Monocytes 559 200 - 950 cells/uL   Eosinophils Absolute 359 15 - 500 cells/uL   Basophils Absolute 83 0 - 200 cells/uL   Neutrophils Relative % 69.2 %   Total Lymphocyte 16.3 %   Monocytes Relative 8.1 %   Eosinophils Relative 5.2 %   Basophils Relative 1.2 %  Thyroglobulin Level  Result Value Ref Range   Thyroglobulin <0.1 (L) ng/mL   Comment    T4, free  Result Value Ref Range   Free T4 1.6 0.8 - 1.8 ng/dL  TSH  Result Value Ref Range   TSH 1.92 0.40 - 4.50 mIU/L  COMPLETE METABOLIC PANEL WITH GFR  Result Value Ref Range   Glucose, Bld 101 (H) 65 - 99 mg/dL   BUN 23 7 - 25 mg/dL   Creat 1.36 (H) 0.60 - 0.93 mg/dL   GFR, Est Non African  American 37 (L) > OR = 60 mL/min/1.56m2   GFR, Est African American 43 (L) > OR = 60 mL/min/1.25m2   BUN/Creatinine Ratio 17 6 - 22 (calc)   Sodium 142 135 - 146 mmol/L   Potassium 5.3 3.5 - 5.3 mmol/L   Chloride 105 98 - 110 mmol/L   CO2 27 20 - 32 mmol/L   Calcium 10.2 8.6 - 10.4 mg/dL   Total Protein 7.1 6.1 - 8.1 g/dL   Albumin 4.1 3.6 - 5.1 g/dL   Globulin 3.0 1.9 - 3.7 g/dL (calc)   AG Ratio 1.4 1.0 - 2.5 (calc)   Total Bilirubin 0.8 0.2 - 1.2 mg/dL   Alkaline phosphatase (APISO) 87 37 - 153 U/L   AST 18 10 - 35 U/L   ALT 16 6 - 29 U/L      Assessment & Plan:   Problem List Items Addressed This Visit    History of lung cancer   Centrilobular emphysema (HCC)    Acute exacerbation COPD today Setting of recent sinusitis Cover for COPD with:  Start taking Levaquin antibiotic 500mg  daily x 7 days Start Prednisone 50mg  daily x 5 days Refill Albuterol PRN  May warrant further maintenance inhaler in future      Relevant Medications   predniSONE (DELTASONE) 50 MG tablet   albuterol (VENTOLIN HFA) 108 (90 Base) MCG/ACT inhaler   Other Relevant Orders   Ambulatory referral to Pulmonology   Aortic atherosclerosis ([place])    Identified on imaging CT, last 2018 Due for repeat imaging On ASA 81mg  daily Not on statin therapy       Other Visit Diagnoses    Acute non-recurrent maxillary sinusitis    -  Primary   Relevant Medications   levofloxacin (LEVAQUIN) 500 MG tablet   predniSONE (DELTASONE) 50 MG tablet   COPD with acute  exacerbation (HCC)       Relevant Medications   levofloxacin (LEVAQUIN) 500 MG tablet   predniSONE (DELTASONE) 50 MG tablet   albuterol (VENTOLIN HFA) 108 (90 Base) MCG/ACT inhaler   Multiple pulmonary nodules       Relevant Orders   Ambulatory referral to Pulmonology       Referral to [place] Discussed case with Burgess Estelle RN on [address] Patient has history multiple pulm nodules and former lung cancer history s/p  lobectomy 2001 Now returning to surveillance Will refer and anticipate future LDCT for follow up lung nodules, she cannot have IV contrast due to kidneys  Orders Placed This Encounter  Procedures  . Ambulatory referral to Pulmonology    Referral Priority:   Routine    Referral Type:   Consultation    Referral Reason:   Specialty Services Required    Requested Specialty:   Pulmonary Disease    Number of Visits Requested:   1       Meds ordered this encounter  Medications  . levofloxacin (LEVAQUIN) 500 MG tablet    Sig: Take 1 tablet (500 mg total) by mouth daily. For 7 days    Dispense:  7 tablet    Refill:  0  . predniSONE (DELTASONE) 50 MG tablet    Sig: Take 1 tablet (50 mg total) by mouth daily with breakfast.    Dispense:  5 tablet    Refill:  0  . albuterol (VENTOLIN HFA) 108 (90 Base) MCG/ACT inhaler    Sig: Inhale 2 puffs into the lungs every 4 (four) hours as needed for wheezing or shortness of breath (cough).    Dispense:  8 g    Refill:  2     Follow up plan: Return in about 1 week (around 10/31/2019), or if symptoms worsen or fail to improve, for Follow-up 1-2 weeks if not improved COPD.  Consider X-ray if not improving.  [name], DO [facility] 10/24/2019, 2:43 PM

## 2019-10-24 NOTE — Patient Instructions (Addendum)
Thank you for coming to the office today.  1. It sounds like you had an Upper Respiratory Virus that has settled into a Bronchitis, lower respiratory tract infection. I don't have concerns for pneumonia today, and think that this should gradually improve. Once you are feeling better, the cough may take a few weeks to fully resolve. I do hear wheezing and coarse breath sounds  Likely Emphysema COPD flare  Start taking Levaquin antibiotic 500mg  daily x 7 days  - Start Prednisone 50mg  daily for next 5 days - this will open up lungs allow you to breath better and treat that wheezing or bronchospasm  - Use Albuterol inhaler 2 puffs every 4-6 hours around the clock for next 2-3 days, max up to 5 days then use as needed  - Use nasal saline (Simply Saline or Ocean Spray) to flush nasal congestion multiple times a day, may help cough - Drink plenty of fluids to improve congestion  -------------------------------------------------------- REFERRAL TO PULMONARY NODULE CLINIC   If your symptoms seem to worsen instead of improve over next several days, including significant fever / chills, worsening shortness of breath, worsening wheezing, or nausea / vomiting and can't take medicines - return sooner or go to hospital Emergency Department for more immediate treatment.   Please schedule a Follow-up Appointment to: Return in about 1 week (around 10/31/2019), or if symptoms worsen or fail to improve, for Follow-up 1-2 weeks if not improved COPD.  If you have any other questions or concerns, please feel free to call the office or send a message through [place]. You may also schedule an earlier appointment if necessary.  Additionally, you may be receiving a survey about your experience at our office within a few days to 1 week by e-mail or mail. We value your feedback.  [name], DO [place]

## 2019-10-24 NOTE — Assessment & Plan Note (Signed)
Acute exacerbation COPD today Setting of recent sinusitis Cover for COPD with:  Start taking Levaquin antibiotic 500mg  daily x 7 days Start Prednisone 50mg  daily x 5 days Refill Albuterol PRN  May warrant further maintenance inhaler in future

## 2019-10-25 ENCOUNTER — Other Ambulatory Visit: Payer: Self-pay | Admitting: Oncology

## 2019-10-25 DIAGNOSIS — F5104 Psychophysiologic insomnia: Secondary | ICD-10-CM

## 2019-10-25 DIAGNOSIS — R911 Solitary pulmonary nodule: Secondary | ICD-10-CM

## 2019-10-25 MED ORDER — LORAZEPAM 0.5 MG PO TABS: 0.5000 mg | ORAL_TABLET | Freq: Every day | ORAL | 5 refills | Status: DC

## 2019-10-25 NOTE — Progress Notes (Signed)
  Pulmonary Nodule Clinic Telephone Note [place]   Received referral from Dr. Danella Penton her PCP.  HPI: [name] is a 79 year old female with past medical history significant for hypertension, carotid stenosis, aortic atherosclerosis, emphysema, GERD, IBS and anxiety who was previously part of the low-dose CT screening program back in 2016.  Had repeat imaging in August 2018 revealing several bilateral pulmonary nodules.  She unfortunately does not meet criteria for our low-dose CT screening program do to quit date.   Review and Recommendations: I personally reviewed her previous imaging which was from 10/27/2016 showing multiple bilateral nodules and groundglass densities similar to prior exam.  Left upper lobe nodule appears somewhat more bulky and slightly increased in size when compared to prior exams.  Short-term follow-up 3 to 6 months is recommended.  I recommend follow-up with noncontrast chest CT in the next 1 to 2 weeks.  Social History:   Tobacco Use: Medium Risk  . Smoking Tobacco Use: Former Smoker  . Smokeless Tobacco Use: Former Education administrator risk factors include: History of heavy smoking, exposure to asbestos, radium or uranium, personal family history of lung cancer, older age, sex (females greater than males), race (black and native [place] greater than weight), marginal speculation, upper lobe location, multiplicity (less than 5 nodules increases risk for malignancy) and emphysema and/or pulmonary fibrosis.   This recommendation follows the consensus statement: Guidelines for Management of Incidental Pulmonary Nodules Detected on CT Images: From the Fleischner Society 2017; Radiology 2017; 284:228-243.    I have placed order for CT scan without contrast to be completed in the next 1 to 2 weeks.  Disposition: Order placed for repeat CT chest. Will notify Len[facility] P[facility] in scheduling. Van to call patient with appointment date and time. Return to  pulmonary nodule clinic a few days after his repeat imaging to discuss results and plan moving forward.  Faythe Casa, NP 10/25/2019 3:03 PM

## 2019-10-27 ENCOUNTER — Encounter: Payer: Self-pay | Admitting: *Deleted

## 2019-11-04 ENCOUNTER — Other Ambulatory Visit: Payer: Medicare Other

## 2019-11-09 ENCOUNTER — Other Ambulatory Visit: Payer: Medicare Other

## 2019-11-10 ENCOUNTER — Ambulatory Visit: Payer: Medicare Other | Admitting: Oncology

## 2019-11-11 ENCOUNTER — Other Ambulatory Visit: Payer: Self-pay | Admitting: Family Medicine

## 2019-11-11 DIAGNOSIS — I10 Essential (primary) hypertension: Secondary | ICD-10-CM

## 2019-11-15 ENCOUNTER — Other Ambulatory Visit: Payer: Self-pay

## 2019-11-15 ENCOUNTER — Ambulatory Visit
Admission: RE | Admit: 2019-11-15 | Discharge: 2019-11-15 | Disposition: A | Discharge status: Home / Self Care | Payer: Medicare Other | Source: Ambulatory Visit | Attending: Oncology | Admitting: Oncology

## 2019-11-15 ENCOUNTER — Ambulatory Visit: Payer: Medicare Other | Admitting: Family Medicine

## 2019-11-15 DIAGNOSIS — R911 Solitary pulmonary nodule: Secondary | ICD-10-CM | POA: Diagnosis not present

## 2019-11-17 ENCOUNTER — Other Ambulatory Visit: Payer: Self-pay

## 2019-11-17 ENCOUNTER — Inpatient Hospital Stay: Payer: Medicare Other | Attending: Oncology | Admitting: Oncology

## 2019-11-17 DIAGNOSIS — Z7952 Long term (current) use of systemic steroids: Secondary | ICD-10-CM | POA: Diagnosis not present

## 2019-11-17 DIAGNOSIS — N183 Chronic kidney disease, stage 3 unspecified: Secondary | ICD-10-CM | POA: Diagnosis not present

## 2019-11-17 DIAGNOSIS — E785 Hyperlipidemia, unspecified: Secondary | ICD-10-CM | POA: Insufficient documentation

## 2019-11-17 DIAGNOSIS — Z8249 Family history of ischemic heart disease and other diseases of the circulatory system: Secondary | ICD-10-CM | POA: Diagnosis not present

## 2019-11-17 DIAGNOSIS — Z87891 Personal history of nicotine dependence: Secondary | ICD-10-CM | POA: Insufficient documentation

## 2019-11-17 DIAGNOSIS — M8580 Other specified disorders of bone density and structure, unspecified site: Secondary | ICD-10-CM | POA: Diagnosis not present

## 2019-11-17 DIAGNOSIS — Z7982 Long term (current) use of aspirin: Secondary | ICD-10-CM | POA: Diagnosis not present

## 2019-11-17 DIAGNOSIS — E039 Hypothyroidism, unspecified: Secondary | ICD-10-CM | POA: Diagnosis not present

## 2019-11-17 DIAGNOSIS — I10 Essential (primary) hypertension: Secondary | ICD-10-CM | POA: Diagnosis not present

## 2019-11-17 DIAGNOSIS — Z8542 Personal history of malignant neoplasm of other parts of uterus: Secondary | ICD-10-CM | POA: Diagnosis not present

## 2019-11-17 DIAGNOSIS — R911 Solitary pulmonary nodule: Secondary | ICD-10-CM | POA: Diagnosis not present

## 2019-11-17 DIAGNOSIS — F419 Anxiety disorder, unspecified: Secondary | ICD-10-CM | POA: Diagnosis not present

## 2019-11-17 DIAGNOSIS — J449 Chronic obstructive pulmonary disease, unspecified: Secondary | ICD-10-CM | POA: Diagnosis not present

## 2019-11-17 DIAGNOSIS — Z79899 Other long term (current) drug therapy: Secondary | ICD-10-CM | POA: Insufficient documentation

## 2019-11-17 DIAGNOSIS — I6523 Occlusion and stenosis of bilateral carotid arteries: Secondary | ICD-10-CM | POA: Diagnosis not present

## 2019-11-17 DIAGNOSIS — R918 Other nonspecific abnormal finding of lung field: Secondary | ICD-10-CM | POA: Diagnosis not present

## 2019-11-17 NOTE — Progress Notes (Signed)
Pulmonary Nodule Clinic Consult note [facility]  Telephone:([phone] [phone]  Patient Care Team: Olin Hauser, DO as PCP - General (Family Medicine) Anthonette Legato, MD (Internal Medicine)   Name of the patient: Margaret Black  [id number]  28-Dec-1940   Date of visit: 11/17/2019   Diagnosis- Lung Nodule  Chief complaint/ Reason for visit- Pulmonary Nodule Clinic Initial Visit  Past Medical History:   Margaret Black is a 79 year old female with past medical history significant for hypertension, hypothyroidism, GERD, arthritis, COPD, lung cancer status post left lobectomy 2001, uterine cancer status post hysterectomy 2001, history of left eye central retinal venous occlusion, and systemic lupus erythematosus and chronic kidney disease stage III who is followed by Dr. Holley Raring. She was previously part of the low-dose CT screening program back in 2016.  Had repeat imaging in August 2018 revealing several bilateral pulmonary nodules.  She unfortunately does not meet criteria for our low-dose CT screening program do to quit date.  Interval history-today she presents back to review most recent CT chest.  Margaret Black is a current non-smoker.  She quit smoking back in 1993.  Smoked about 1 pack/day.  Has COPD emphysema.  Followed by her PCP.  Denies any known occupational exposure known to cause cancer..  Currently is retired.   Currently, she is doing well.  She denies any current problems.  No shortness of breath or cough at this time. Denies any neurologic complaints. Denies recent fevers or illnesses. Denies any easy bleeding or bruising. Reports good appetite and denies weight loss. Denies chest pain. Denies any nausea, vomiting, constipation, or diarrhea. Denies urinary complaints. Patient offers no further specific complaints today.   ECOG FS:0 - Asymptomatic  Review of systems- ROS   Allergies  Allergen Reactions  . Lactase Diarrhea  .  Atenolol Rash  . Latex Rash     Past Medical History:  Diagnosis Date  . Anxiety    Related to HTN and vision loss  . Central retinal vein occlusion of left eye    legally blind in Left eye  . 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 Collagen vascular disease ([place])    uncharacterized.  has seen rheum.  positive RF, positive serologies for Sjogrens.  C-ANCA/P-ANCA neg, ANA  neg, anti-dsDNA neg, anti-SCL70 neg, anti-centromere Ab neg  . COPD (chronic obstructive pulmonary disease) ([place])    quit smoking 1993  . Diverticulosis   . Dysfunction of eustachian tube   . Fatigue   . GERD (gastroesophageal reflux disease)   . Goiter, unspecified   . HLD (hyperlipidemia)   . HTN (hypertension)    difficult to control, hypertensive urgency hospitalizations, normal urine/plasma metanephrines and catecholamines, renal dopplers without stenosis  . Hyperaldosteronism    serum aldo/renin ration elevated (aldo 23.7, renin <0.15), CT abd 2010 with normal adrenals, dedicated adrenal CT without adrenal adenoma,   . Hypothyroid   . Insomnia    Related to HTN and vision loss  . Irritable bowel syndrome   . 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 Lung cancer ([place])    s/p L lobectomy 2001  . Lupus ([place])   . Osteopenia   . Pulmonary hypertension ([place])    echo [facility] 12/2008 with EF >5%, grade II diastolic dysfunction, RSVP >65mmHg, ACE level normal.  f/u echo at [place] - EF 55-60%, mild MR, mild LAE, PASP 25 + RA (no pulm HTN)  . Urge incontinence   .  Uterine cancer [facility])    s/p hysterectomy 2001     Past Surgical History:  Procedure Laterality Date  . ABDOMINAL HYSTERECTOMY  06/1999  . APPENDECTOMY  1958  . CHOLECYSTECTOMY  1982  . CT ABD W & PELVIS WO CM  2010   neg except diverticulosis  . CT chest  2007   enlarged thyroid with goiter  . LOBECTOMY  04/1999   right lung  . THYROIDECTOMY    . TUBAL LIGATION  1975    Social History    Socioeconomic History  . Marital status: Married    Spouse name: Not on file  . Number of children: 4  . Years of education: Not on file  . Highest education level: Not on file  Occupational History    Employer: RETIRED  Tobacco Use  . Smoking status: Former Smoker    Types: Cigarettes    Quit date: 07/04/1991    Years since quitting: 28.3  . Smokeless tobacco: Former Systems developer  . Tobacco comment: Quit in early 1980's.  Vaping Use  . Vaping Use: Never used  Substance and Sexual Activity  . Alcohol use: No  . Drug use: No  . Sexual activity: Yes    Birth control/protection: None, Post-menopausal  Other Topics Concern  . Not on file  Social History Narrative   Married 1962, 4 children, lives in [place]. Originally from [place]   Regular exercise-yes   Enjoys playing bridge   Social Determinants of Radio broadcast assistant Strain:   . Difficulty of Paying Living Expenses: Not on file  Food Insecurity:   . Worried About Charity fundraiser in the Last Year: Not on file  . Ran Out of Food in the Last Year: Not on file  Transportation Needs:   . Lack of Transportation (Medical): Not on file  . Lack of Transportation (Non-Medical): Not on file  Physical Activity:   . Days of Exercise per Week: Not on file  . Minutes of Exercise per Session: Not on file  Stress:   . Feeling of Stress : Not on file  Social Connections:   . Frequency of Communication with Friends and Family: Not on file  . Frequency of Social Gatherings with Friends and Family: Not on file  . Attends Religious Services: Not on file  . Active Member of Clubs or Organizations: Not on file  . Attends Archivist Meetings: Not on file  . Marital Status: Not on file  Intimate Partner Violence:   . Fear of Current or Ex-Partner: Not on file  . Emotionally Abused: Not on file  . Physically Abused: Not on file  . Sexually Abused: Not on file    Family History  Problem Relation Age of Onset  .  Hypertension Mother   . Pneumonia Father   . Alcohol abuse Father      Current Outpatient Medications:  .  albuterol (VENTOLIN HFA) 108 (90 Base) MCG/ACT inhaler, Inhale 2 puffs into the lungs every 4 (four) hours as needed for wheezing or shortness of breath (cough)., Disp: 8 g, Rfl: 2 .  aspirin 81 MG tablet, Take 81 mg by mouth daily., Disp: , Rfl:  .  Aspirin-Calcium Carbonate 81-777 MG TABS, Take by mouth., Disp: , Rfl:  .  BYSTOLIC 5 MG tablet, Take 1 tablet (5 mg total) by mouth daily., Disp: 90 tablet, Rfl: 3 .  CALCIUM-VITAMIN D PO, Take by mouth daily., Disp: , Rfl:  .  enalapril (VASOTEC) 2.5  MG tablet, Take 1 tablet (2.5 mg total) by mouth daily., Disp: 90 tablet, Rfl: 3 .  levofloxacin (LEVAQUIN) 500 MG tablet, Take 1 tablet (500 mg total) by mouth daily. For 7 days, Disp: 7 tablet, Rfl: 0 .  LORazepam (ATIVAN) 0.5 MG tablet, Take 1 tablet (0.5 mg total) by mouth at bedtime., Disp: 30 tablet, Rfl: 5 .  Multiple Vitamin (MULTIVITAMIN) tablet, Take 1 tablet by mouth daily., Disp: , Rfl:  .  NIFEdipine (PROCARDIA XL/NIFEDICAL XL) 60 MG 24 hr tablet, TAKE 1 TABLET EVERY EVENING, Disp: 90 tablet, Rfl: 0 .  predniSONE (DELTASONE) 50 MG tablet, Take 1 tablet (50 mg total) by mouth daily with breakfast., Disp: 5 tablet, Rfl: 0 .  spironolactone (ALDACTONE) 25 MG tablet, Take 1 tablet (25 mg total) by mouth 2 (two) times daily., Disp: 180 tablet, Rfl: 1 .  SYNTHROID 88 MCG tablet, Take 1 tablet (88 mcg total) by mouth daily before breakfast., Disp: 90 tablet, Rfl: 3 .  vitamin C (ASCORBIC ACID) 500 MG tablet, Take 500 mg by mouth daily., Disp: , Rfl:   Physical exam: There were no vitals filed for this visit. Physical Exam Constitutional:      Appearance: Normal appearance.  HENT:     Head: Normocephalic and atraumatic.  Eyes:     Pupils: Pupils are equal, round, and reactive to light.  Cardiovascular:     Rate and Rhythm: Normal rate and regular rhythm.     Heart sounds: Normal  heart sounds. No murmur heard.   Pulmonary:     Effort: Pulmonary effort is normal.     Breath sounds: Normal breath sounds. No wheezing.  Abdominal:     General: Bowel sounds are normal. There is no distension.     Palpations: Abdomen is soft.     Tenderness: There is no abdominal tenderness.  Musculoskeletal:        General: Normal range of motion.     Cervical back: Normal range of motion.  Skin:    General: Skin is warm and dry.     Findings: No rash.  Neurological:     Mental Status: She is alert and oriented to person, place, and time.  Psychiatric:        Judgment: Judgment normal.      CMP Latest Ref Rng & Units 08/02/2018  Glucose 65 - 99 mg/dL 101(H)  BUN 7 - 25 mg/dL 23  Creatinine 0.60 - 0.93 mg/dL 1.36(H)  Sodium 135 - 146 mmol/L 142  Potassium 3.5 - 5.3 mmol/L 5.3  Chloride 98 - 110 mmol/L 105  CO2 20 - 32 mmol/L 27  Calcium 8.6 - 10.4 mg/dL 10.2  Total Protein 6.1 - 8.1 g/dL 7.1  Total Bilirubin 0.2 - 1.2 mg/dL 0.8  Alkaline Phos 33 - 130 U/L -  AST 10 - 35 U/L 18  ALT 6 - 29 U/L 16   CBC Latest Ref Rng & Units 08/02/2018  WBC 3.8 - 10.8 Thousand/uL 6.9  Hemoglobin 11.7 - 15.5 g/dL 12.3  Hematocrit 35 - 45 % 36.9  Platelets 140 - 400 Thousand/uL 323    No images are attached to the encounter.  CT Chest Wo Contrast  Result Date: 11/15/2019 CLINICAL DATA:  Dry cough and congestion with shortness of breath. Pulmonary nodule. EXAM: CT CHEST WITHOUT CONTRAST TECHNIQUE: Multidetector CT imaging of the chest was performed following the standard protocol without IV contrast. COMPARISON:  10/27/2016. FINDINGS: Cardiovascular: The heart size is normal. No substantial pericardial effusion. Coronary  artery calcification is evident. Atherosclerotic calcification is noted in the wall of the thoracic aorta. Mediastinum/Nodes: No mediastinal lymphadenopathy. No evidence for gross hilar lymphadenopathy although assessment is limited by the lack of intravenous contrast on  today's study. The esophagus has normal imaging features. There is no axillary lymphadenopathy. Lungs/Pleura: Centrilobular emphsyema noted. Stable scarring in the right apex. Interval progression of a mildly spiculated left upper lobe pulmonary nodule, measuring 1.0 x 1.0 x 1.1 cm today compared to 0.8 x 0.5 x 0.6 cm previously. Other scattered small ground-glass opacities are similar. The 11 mm left lower lobe nodule (103/3) is stable in the interval and has a tubular configuration, potentially related to airway impaction. Bronchial wall thickening with bronchiectasis in the left lower lobe posteriorly is similar to prior. No pleural effusion. Upper Abdomen: 12 mm low-density lesion lateral subcapsular liver approaches water attenuation and is likely a cyst. Musculoskeletal: No worrisome lytic or sclerotic osseous abnormality. IMPRESSION: 1. Clear interval progression of the mildly spiculated right upper lobe pulmonary nodule now measuring up to 11 mm maximum diameter. Imaging features highly suspicious for primary bronchogenic neoplasm. PET-CT recommended to further evaluate. 2. Other scattered nodular opacities in the lungs are stable. 3.  Emphysema (ICD10-J43.9) and Aortic Atherosclerosis (ICD10-170.0) These results will be called to the ordering clinician or representative by the Radiologist Assistant, and communication documented in the PACS or [employer]. Electronically Signed   By: Misty Stanley M.D.   On: 11/15/2019 11:46     Assessment and plan- Patient is a 79 y.o. female who presents to pulmonary nodule clinic for follow-up of incidental lung nodules.  A telephone visit was conducted to review most recent CT scan results.    CT chest without contrast from today shows clear interval progression of mildly spiculated right upper lobe pulmonary nodule now measuring up to 11 mm in diameter highly suspicious for primary bronchogenic neoplasm.  Recommend PET scan for further  evaluation.  Unfortunately, patient has underlying chronic kidney disease with only one working kidney and cannot tolerate IV contrast.  We will reach out to Dr. Holley Raring for his recommendations.   I have also reached out to Dr. Patsey Berthold, pulmonologist to discuss findings.    Calculating malignancy probability of a pulmonary nodule: Risk factors include: 1.  Age. 2.  Cancer history. 3.  Diameter of pulmonary nodule and mm 4.  Location 5.  Smoking history 6.  Spiculation present   Based on risk factors, this patient is High risk for the development of lung cancer.   During our visit, we discussed pulmonary nodules are a common incidental finding and are often how lung cancer is discovered.  Lung cancer survival is directly related to the stage at diagnosis.  We discussed that nodules can vary in presentation from solitary pulmonary nodules to masses, 2 groundglass opacities and multiple nodules.  Pulmonary nodules in the majority of cases are benign but the probability of these becoming malignant cannot be undermined.  Early identification of malignant nodules could lead to early diagnosis and increased survival.   We discussed the probability of pulmonary nodules becoming malignant increase with age, pack years of tobacco use, size/characteristics of the nodule and location; with upper lobe involvement being most worrisome.   We discussed the goal of our clinic is to thoroughly evaluate each nodule, developed a comprehensive, individualized plan of care utilizing the most advanced technology and significantly reduce the time from detection to treatment.  A dedicated pulmonary nodule clinic has proven to indeed expedite  the detection and treatment of lung cancer.   Patient education in fact sheet provided along with most recent CT scans.  Plan: Review CT chest with patient. Will reach out to Dr. Patsey Berthold for recommendations. Will reach out to Dr. Holley Raring regarding possible imaging given  chronic kidney disease. Patient will likely need a PET scan.  We will hold off on scheduling until we speak to MDs.  Disposition: We will touch base next week.    Visit Diagnosis 1. Lung nodule     Patient expressed understanding and was in agreement with this plan. She also understands that She can call clinic at any time with any questions, concerns, or complaints.   Greater than 50% was spent in counseling and coordination of care with this patient including but not limited to discussion of the relevant topics above (See A&P) including, but not limited to diagnosis and management of acute and chronic medical conditions.   Thank you for allowing me to participate in the care of this very pleasant patient.    Jacquelin Hawking, NP [place] at [facility] Cell - [phone] Pager- [phone] 11/17/2019 1:10 PM

## 2019-11-23 ENCOUNTER — Other Ambulatory Visit: Payer: Medicare Other

## 2019-11-25 ENCOUNTER — Telehealth: Payer: Self-pay | Admitting: *Deleted

## 2019-11-25 DIAGNOSIS — R911 Solitary pulmonary nodule: Secondary | ICD-10-CM

## 2019-11-25 NOTE — Telephone Encounter (Signed)
Pt scheduled for PET scan on 12/06/19 at 930am. Instructed to arrive at [place] at the [facility]. Per pt request, wishes to see Dr. Genevive Bi after PET to discuss results and options based on results. Appt scheduled with Dr. Genevive Bi on 9/17 at 8am. Pt made aware. Nothing further needed at this time.

## 2019-12-02 ENCOUNTER — Ambulatory Visit: Payer: Self-pay | Attending: Internal Medicine

## 2019-12-02 ENCOUNTER — Ambulatory Visit: Payer: Self-pay

## 2019-12-02 DIAGNOSIS — Z23 Encounter for immunization: Secondary | ICD-10-CM

## 2019-12-05 NOTE — Progress Notes (Signed)
   Covid-19 Vaccination Clinic  Name:  Margaret Black    MRN: [id number] DOB: February 03, 1941  12/05/2019  Ms. Margaret Black was observed post Covid-19 immunization for 15 minutes without incident. She was provided with Vaccine Information Sheet and instruction to access the V-Safe system.   Margaret Black was instructed to call 911 with any severe reactions post vaccine: Marland Kitchen Difficulty breathing  . Swelling of face and throat  . A fast heartbeat  . A bad rash all over body  . Dizziness and weakness

## 2019-12-06 ENCOUNTER — Ambulatory Visit
Admission: RE | Admit: 2019-12-06 | Discharge: 2019-12-06 | Disposition: A | Discharge status: Home / Self Care | Payer: Medicare Other | Source: Ambulatory Visit | Attending: Oncology | Admitting: Oncology

## 2019-12-06 ENCOUNTER — Other Ambulatory Visit: Payer: Self-pay

## 2019-12-06 DIAGNOSIS — R911 Solitary pulmonary nodule: Secondary | ICD-10-CM | POA: Diagnosis not present

## 2019-12-06 DIAGNOSIS — I2510 Atherosclerotic heart disease of native coronary artery without angina pectoris: Secondary | ICD-10-CM | POA: Diagnosis not present

## 2019-12-06 DIAGNOSIS — Z85118 Personal history of other malignant neoplasm of bronchus and lung: Secondary | ICD-10-CM | POA: Insufficient documentation

## 2019-12-06 DIAGNOSIS — K5730 Diverticulosis of large intestine without perforation or abscess without bleeding: Secondary | ICD-10-CM | POA: Diagnosis not present

## 2019-12-06 DIAGNOSIS — J439 Emphysema, unspecified: Secondary | ICD-10-CM | POA: Insufficient documentation

## 2019-12-06 DIAGNOSIS — I700 Atherosclerosis of aorta: Secondary | ICD-10-CM | POA: Insufficient documentation

## 2019-12-06 LAB — GLUCOSE, CAPILLARY: Glucose-Capillary: 107 mg/dL — ABNORMAL HIGH (ref 70–99)

## 2019-12-06 MED ORDER — FLUDEOXYGLUCOSE F - 18 (FDG) INJECTION
6.9000 | Freq: Once | INTRAVENOUS | Status: AC | PRN
  Administered 2019-12-06: 7.37 via INTRAVENOUS

## 2019-12-06 NOTE — Progress Notes (Signed)
Does she need a Med/onc appt?  Faythe Casa, NP 12/06/2019 5:02 PM

## 2019-12-09 ENCOUNTER — Encounter: Payer: Self-pay | Admitting: Cardiothoracic Surgery

## 2019-12-09 ENCOUNTER — Other Ambulatory Visit: Payer: Self-pay

## 2019-12-09 ENCOUNTER — Ambulatory Visit (INDEPENDENT_AMBULATORY_CARE_PROVIDER_SITE_OTHER): Payer: Medicare Other | Admitting: Cardiothoracic Surgery

## 2019-12-09 VITALS — BP 146/75 | HR 66 | Temp 98.2°F | Resp 12 | Ht 65.0 in | Wt 124.6 lb

## 2019-12-09 DIAGNOSIS — R911 Solitary pulmonary nodule: Secondary | ICD-10-CM

## 2019-12-09 NOTE — Patient Instructions (Addendum)
We spoke about having Radiation therapy verses surgery for lung resection.  We also discussed having a CT guided biopsy of the area in your lung.  We will refer you to Radiation Oncology to discuss treatment with them.   Call us if you would like to come back to follow up with Dr Genevive Bi.

## 2019-12-09 NOTE — Progress Notes (Signed)
[name], female   DOB: 1940/11/11, 79 y.o.   MRN: [id number]  Chief Complaint  Patient presents with  . New Patient (Initial Visit)    Lung nodules    Referred By Dr. [name] Reason for Referral left upper lobe mass  HPI Location, Quality, Duration, Severity, Timing, Context, Modifying Factors, Associated Signs and Symptoms.  Margaret Black is a 79 y.o. female. This patient is a 80 year old female with a extensive past medical history. She underwent a right thoracotomy and right middle lobectomy about 20 years ago for what was presumed to be a lung cancer. In fact this turned out to be a leiomyosarcoma from the uterus and she was then treated with a hysterectomy several months later. Shortly thereafter she underwent a left thoracoscopy for excision of a pulmonary nodule and was reportedly benign. All those records were up at the [facility] in [place] and they are not available for my review. However the patient is quite knowledgeable about her medical care and she believes that the lesion in the left lung was different. She has been followed by Dr. Oliva Bustard over the years and her last CT scan was in 2018. She then had a repeat chest CT made just recently. This revealed a nodule in the left upper lobe which was obviously larger than it was back in 2018. A subsequent PET scan confirmed the presence of this left upper lobe nodule without evidence of distant disease. The patient smoked up until about 20 years ago. She does have a history of COPD. She states that she was reasonably active before Covid but since Covid she is spent most of her time not exercising. She does get short of breath with 1 flight of stairs. She denies any recent weight loss or weight gain.   Past Medical History:  Diagnosis Date  . Anxiety    Related to HTN and vision loss  . Central retinal vein occlusion of left eye    legally blind in Left eye  . 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 Collagen vascular disease ([place])    uncharacterized.  has seen rheum.  positive RF, positive serologies for Sjogrens.  C-ANCA/P-ANCA neg, ANA  neg, anti-dsDNA neg, anti-SCL70 neg, anti-centromere Ab neg  . COPD (chronic obstructive pulmonary disease) ([place])    quit smoking 1993  . Diverticulosis   . Dysfunction of eustachian tube   . Fatigue   . GERD (gastroesophageal reflux disease)   . Goiter, unspecified   . HLD (hyperlipidemia)   . HTN (hypertension)    difficult to control, hypertensive urgency hospitalizations, normal urine/plasma metanephrines and catecholamines, renal dopplers without stenosis  . Hyperaldosteronism    serum aldo/renin ration elevated (aldo 23.7, renin <0.15), CT abd 2010 with normal adrenals, dedicated adrenal CT without adrenal adenoma,   . Hypothyroid   . Insomnia    Related to HTN and vision loss  . Irritable bowel syndrome   . Leiomyosarcoma of uterus ([place]) 09/22/2014  . Lung cancer [facility])    s/p R lobectomy 2001  . Lupus ([place])   . Osteopenia   . Pulmonary hypertension ([place])    echo [facility] 12/2008 with EF >5%, grade II diastolic dysfunction, RSVP >30mmHg, ACE level normal.  f/u echo at [place] - EF 55-60%, mild MR, mild LAE, PASP 25 + RA (no pulm HTN)  . Thyroid cancer ([place]) 2014  . Urge incontinence   . Uterine cancer ([place])  s/p hysterectomy 2001    Past Surgical History:  Procedure Laterality Date  . ABDOMINAL HYSTERECTOMY  06/1999  . APPENDECTOMY  1958  . CHOLECYSTECTOMY  1982  . CT ABD W & PELVIS WO CM  2010   neg except diverticulosis  . CT chest  2007   enlarged thyroid with goiter  . LOBECTOMY Right 04/1999   right lung, done in [place]  . lung nodule excision Left 2003   benign  . THYROIDECTOMY  2014  . TUBAL LIGATION  1975    Family History  Problem Relation Age of Onset  . Hypertension Mother   . Pneumonia Father   . Alcohol abuse Father     Social History Social History    Tobacco Use  . Smoking status: Former Smoker    Types: Cigarettes    Quit date: 07/04/1991    Years since quitting: 28.4  . Smokeless tobacco: Former Systems developer  . Tobacco comment: Quit in early 1980's.  Vaping Use  . Vaping Use: Never used  Substance Use Topics  . Alcohol use: No  . Drug use: No    Allergies  Allergen Reactions  . Lactase Diarrhea  . Atenolol Rash  . Latex Rash    Current Outpatient Medications  Medication Sig Dispense Refill  . albuterol (VENTOLIN HFA) 108 (90 Base) MCG/ACT inhaler Inhale 2 puffs into the lungs every 4 (four) hours as needed for wheezing or shortness of breath (cough). 8 g 2  . aspirin 81 MG tablet Take 81 mg by mouth daily.    . Aspirin-Calcium Carbonate 81-777 MG TABS Take by mouth.    . BYSTOLIC 5 MG tablet Take 1 tablet (5 mg total) by mouth daily. 90 tablet 3  . CALCIUM-VITAMIN D PO Take by mouth daily.    . enalapril (VASOTEC) 2.5 MG tablet Take 1 tablet (2.5 mg total) by mouth daily. 90 tablet 3  . LORazepam (ATIVAN) 0.5 MG tablet Take 1 tablet (0.5 mg total) by mouth at bedtime. 30 tablet 5  . Multiple Vitamin (MULTIVITAMIN) tablet Take 1 tablet by mouth daily.    Marland Kitchen NIFEdipine (PROCARDIA XL/NIFEDICAL XL) 60 MG 24 hr tablet TAKE 1 TABLET EVERY EVENING 90 tablet 0  . spironolactone (ALDACTONE) 25 MG tablet Take 1 tablet (25 mg total) by mouth 2 (two) times daily. 180 tablet 1  . SYNTHROID 88 MCG tablet Take 1 tablet (88 mcg total) by mouth daily before breakfast. 90 tablet 3  . vitamin C (ASCORBIC ACID) 500 MG tablet Take 500 mg by mouth daily.     No current facility-administered medications for this visit.      Review of Systems A complete review of systems was asked and was negative except for the following positive findings cough, shortness of breath, heartburn.  Blood pressure (!) 146/75, pulse 66, temperature 98.2 F (36.8 C), resp. rate 12, height 5\' 5"  (1.651 m), weight 124 lb 9.6 oz (56.5 kg), SpO2 97 %.  Physical  Exam CONSTITUTIONAL:  Pleasant, well-developed, well-nourished, and in no acute distress. EYES: Pupils equal and reactive to light, Sclera non-icteric EARS, NOSE, MOUTH AND THROAT:  The oropharynx was clear.  Dentition is good repair.  Oral mucosa pink and moist. LYMPH NODES:  Lymph nodes in the neck and axillae were normal RESPIRATORY:  Lungs were clear.  Normal respiratory effort without pathologic use of accessory muscles of respiration CARDIOVASCULAR: Heart was regular without murmurs.  There were no carotid bruits. GI: The abdomen was soft, nontender, and nondistended. There  were no palpable masses. There was no hepatosplenomegaly. There were normal bowel sounds in all quadrants. GU:  Rectal deferred.   MUSCULOSKELETAL:  Normal muscle strength and tone.  No clubbing or cyanosis.   SKIN:  There were no pathologic skin lesions.  There were no nodules on palpation.  She does have a thoracotomy scar are on the right which is vertical and has multiple thoracoscopy scars on the left NEUROLOGIC:  Sensation is normal.  Cranial nerves are grossly intact. PSYCH:  Oriented to person, place and time.  Mood and affect are normal.  Data R excellent eviewed CT scan and PET scan  I have personally reviewed the patient's imaging, laboratory findings and medical records.    Assessment    Left upper lobe mass    Plan    I had a long discussion with her regarding the indications and risks of surgery.  I believe that the left upper lobe mass could either be a primary lung cancer or metastatic disease from her uterus.  I have encouraged her to obtain the records from the [place] which she states she has at home.  She also has declined any surgical intervention given the fact that she has baseline shortness of breath and does not wish to pursue any surgery.  I therefore offered her referral to our radiation therapy colleagues to see if they be willing to assess her for consideration of  treatment.  I did not make a return visit for but be happy to see her should the need arise.       Nestor Lewandowsky, MD 12/09/2019, 8:58 AM

## 2019-12-15 ENCOUNTER — Institutional Professional Consult (permissible substitution): Payer: Medicare Other | Admitting: Radiation Oncology

## 2019-12-19 ENCOUNTER — Encounter: Payer: Self-pay | Admitting: *Deleted

## 2019-12-19 ENCOUNTER — Ambulatory Visit
Admission: RE | Admit: 2019-12-19 | Discharge: 2019-12-19 | Disposition: A | Discharge status: Home / Self Care | Payer: Medicare Other | Source: Ambulatory Visit | Attending: Radiation Oncology | Admitting: Radiation Oncology

## 2019-12-19 ENCOUNTER — Other Ambulatory Visit: Payer: Self-pay

## 2019-12-19 ENCOUNTER — Encounter: Payer: Self-pay | Admitting: Radiation Oncology

## 2019-12-19 VITALS — BP 122/57 | HR 72 | Resp 16 | Wt 127.2 lb

## 2019-12-19 DIAGNOSIS — N189 Chronic kidney disease, unspecified: Secondary | ICD-10-CM | POA: Insufficient documentation

## 2019-12-19 DIAGNOSIS — Z8542 Personal history of malignant neoplasm of other parts of uterus: Secondary | ICD-10-CM | POA: Diagnosis not present

## 2019-12-19 DIAGNOSIS — E039 Hypothyroidism, unspecified: Secondary | ICD-10-CM | POA: Diagnosis not present

## 2019-12-19 DIAGNOSIS — J449 Chronic obstructive pulmonary disease, unspecified: Secondary | ICD-10-CM | POA: Insufficient documentation

## 2019-12-19 DIAGNOSIS — G4700 Insomnia, unspecified: Secondary | ICD-10-CM | POA: Diagnosis not present

## 2019-12-19 DIAGNOSIS — I998 Other disorder of circulatory system: Secondary | ICD-10-CM | POA: Insufficient documentation

## 2019-12-19 DIAGNOSIS — R911 Solitary pulmonary nodule: Secondary | ICD-10-CM | POA: Diagnosis present

## 2019-12-19 DIAGNOSIS — K219 Gastro-esophageal reflux disease without esophagitis: Secondary | ICD-10-CM | POA: Insufficient documentation

## 2019-12-19 DIAGNOSIS — Z87891 Personal history of nicotine dependence: Secondary | ICD-10-CM | POA: Insufficient documentation

## 2019-12-19 DIAGNOSIS — E785 Hyperlipidemia, unspecified: Secondary | ICD-10-CM | POA: Diagnosis not present

## 2019-12-19 DIAGNOSIS — Z79899 Other long term (current) drug therapy: Secondary | ICD-10-CM | POA: Diagnosis not present

## 2019-12-19 DIAGNOSIS — Z90710 Acquired absence of both cervix and uterus: Secondary | ICD-10-CM | POA: Insufficient documentation

## 2019-12-19 DIAGNOSIS — Z7982 Long term (current) use of aspirin: Secondary | ICD-10-CM | POA: Insufficient documentation

## 2019-12-19 DIAGNOSIS — I10 Essential (primary) hypertension: Secondary | ICD-10-CM | POA: Insufficient documentation

## 2019-12-19 DIAGNOSIS — F419 Anxiety disorder, unspecified: Secondary | ICD-10-CM | POA: Diagnosis not present

## 2019-12-19 NOTE — Progress Notes (Signed)
  Oncology Nurse Navigator Documentation  Navigator Location: [facility] (12/19/19 1500) Referral Date to RadOnc/MedOnc: 12/09/19 (12/19/19 1500) )Navigator Encounter Type: Initial RadOnc (12/19/19 1500)   Abnormal Finding Date: 12/06/19 (12/19/19 1500)                   Treatment Phase: Pre-Tx/Tx Discussion (12/19/19 1500) Barriers/Navigation Needs: No Barriers At This Time (12/19/19 1500)   Interventions: None Required (12/19/19 1500)             met with patient during initial consult with Dr. Baruch Gouty. All questions answered during visit. Reviewed upcoming appts. Pt given contact info and instructed to call with any further questions or needs. Pt verbalized understanding.         Time Spent with Patient: 30 (12/19/19 1500)

## 2019-12-19 NOTE — Consult Note (Signed)
NEW PATIENT EVALUATION  Name: Margaret Black  MRN: [id number]  Date:   12/19/2019     DOB: [birth year]   This 79 y.o. female patient presents to the clinic for initial evaluation of left upper lobe stage I non-small cell lung cancer.  REFERRING PHYSICIAN: [name] *  CHIEF COMPLAINT:  Chief Complaint  Patient presents with  . Lung Cancer    Initial consultation    DIAGNOSIS: The encounter diagnosis was Lung nodule.   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  She did have a hysterectomy at that time.  She also had a left thoracotomy at that time for benign pulmonary nodules.  All of these surgeries were done at [facility] in [place].  She has been followed in the past for Dr. Oliva Bustard which was terminated in 2018.  She recently a CT scan showing a left upper lobe lung nodule which has grown since 2018.  PET CT scan showed hypermetabolic activity of the left upper lobe nodule without evidence of mediastinal adenopathy or distant disease.  She did have some recent activity in her left axillary nodes compatible with recent COVID-19 booster vaccination she is asymptomatic although does have shortness of breath she has been seen by Dr. Faith Rogue has refused surgery as well as attempted needle biopsy.  She is referred to radiation oncology today for opinion.  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), Thyroid cancer ([place]) (2014), Urge incontinence, and Uterine cancer ([place]).    PAST SURGICAL HISTORY:  Past Surgical History:  Procedure Laterality Date  . ABDOMINAL HYSTERECTOMY  06/1999  . APPENDECTOMY  1958  . CHOLECYSTECTOMY  1982  . CT ABD W & PELVIS WO CM  2010   neg except diverticulosis  . CT chest  2007   enlarged thyroid with goiter  . LOBECTOMY Right 04/1999   right lung, done in [place]  . lung nodule excision Left 2003   benign  . THYROIDECTOMY  2014  . TUBAL LIGATION  1975    FAMILY HISTORY: family history includes Alcohol abuse in her father; Hypertension in her mother; Pneumonia in her father.  SOCIAL HISTORY:  reports that she quit smoking about 28 years ago. Her smoking use included cigarettes. She has quit using smokeless tobacco. She reports that she does not drink alcohol and does not use drugs.  ALLERGIES: Lactase, Atenolol, and Latex  MEDICATIONS:  Current Outpatient Medications  Medication Sig Dispense Refill  . albuterol (VENTOLIN HFA) 108 (90 Base) MCG/ACT inhaler Inhale 2 puffs into the lungs every 4 (four) hours as needed for wheezing or shortness of breath (cough). 8 g 2  . aspirin 81 MG tablet Take 81 mg by mouth daily.    . Aspirin-Calcium Carbonate 81-777 MG TABS Take by mouth.    . BYSTOLIC 5 MG tablet Take 1 tablet (5 mg total) by mouth daily. 90 tablet  3  . CALCIUM-VITAMIN D PO Take by mouth daily.    . enalapril (VASOTEC) 2.5 MG tablet Take 1 tablet (2.5 mg total) by mouth daily. 90 tablet 3  . LORazepam (ATIVAN) 0.5 MG tablet Take 1 tablet (0.5 mg total) by mouth at bedtime. 30 tablet 5  . Multiple Vitamin (MULTIVITAMIN) tablet Take 1 tablet by mouth daily.    Marland Kitchen NIFEdipine (PROCARDIA XL/NIFEDICAL XL) 60 MG 24 hr tablet TAKE 1  TABLET EVERY EVENING 90 tablet 0  . spironolactone (ALDACTONE) 25 MG tablet Take 1 tablet (25 mg total) by mouth 2 (two) times daily. 180 tablet 1  . SYNTHROID 88 MCG tablet Take 1 tablet (88 mcg total) by mouth daily before breakfast. 90 tablet 3  . vitamin C (ASCORBIC ACID) 500 MG tablet Take 500 mg by mouth daily.     No current facility-administered medications for this encounter.    ECOG PERFORMANCE STATUS:  0 - Asymptomatic  REVIEW OF SYSTEMS: Patient does have a history of leiomyosarcoma of the uterus with metastasis to her right lung 20 years prior Patient denies any weight loss, fatigue, weakness, fever, chills or night sweats. Patient denies any loss of vision, blurred vision. Patient denies any ringing  of the ears or hearing loss. No irregular heartbeat. Patient denies heart murmur or history of fainting. Patient denies any chest pain or pain radiating to her upper extremities. Patient denies any shortness of breath, difficulty breathing at night, cough or hemoptysis. Patient denies any swelling in the lower legs. Patient denies any nausea vomiting, vomiting of blood, or coffee ground material in the vomitus. Patient denies any stomach pain. Patient states has had normal bowel movements no significant constipation or diarrhea. Patient denies any dysuria, hematuria or significant nocturia. Patient denies any problems walking, swelling in the joints or loss of balance. Patient denies any skin changes, loss of hair or loss of weight. Patient denies any excessive worrying or anxiety or significant depression. Patient denies any problems with insomnia. Patient denies excessive thirst, polyuria, polydipsia. Patient denies any swollen glands, patient denies easy bruising or easy bleeding. Patient denies any recent infections, allergies or URI. Patient "s visual fields have not changed significantly in recent time.   PHYSICAL EXAM: BP (!) 122/57 (BP Location: Left Arm, Patient Position: Sitting)    Pulse 72   Resp 16   Wt 127 lb 3.2 oz (57.7 kg)   BMI 21.17 kg/m  Well-developed well-nourished patient in NAD. HEENT reveals PERLA, EOMI, discs not visualized.  Oral cavity is clear. No oral mucosal lesions are identified. Neck is clear without evidence of cervical or supraclavicular adenopathy. Lungs are clear to A&P. Cardiac examination is essentially unremarkable with regular rate and rhythm without murmur rub or thrill. Abdomen is benign with no organomegaly or masses noted. Motor sensory and DTR levels are equal and symmetric in the upper and lower extremities. Cranial nerves II through XII are grossly intact. Proprioception is intact. No peripheral adenopathy or edema is identified. No motor or sensory levels are noted. Crude visual fields are within normal range.  LABORATORY DATA: Labs reviewed    RADIOLOGY RESULTS: CT scan and PET CT scan reviewed compatible with above-stated findings   IMPRESSION: Probable stage Ia non-small cell lung cancer of the left upper lobe in 79 year old female  PLAN: At this time of offered SBRT to her left upper lobe nodule.  Would not need a biopsy at this time based on the evidence that this is a primary bronchogenic carcinoma based  on the growth over time as well as hypermetabolic activity on PET CT scan.  I would plan on delivering 60 Gy in 5 fractions.  Would use motion restriction as well as 4-dimensional treatment planning.  I have personally set up and ordered simulation in about 2 weeks time.  Patient comprehends my recommendations well.  I would like to take this opportunity to thank you for allowing me to participate in the care of your patient.Noreene Filbert, MD

## 2020-01-04 ENCOUNTER — Ambulatory Visit: Payer: Medicare Other

## 2020-01-06 ENCOUNTER — Ambulatory Visit
Admission: RE | Admit: 2020-01-06 | Discharge: 2020-01-06 | Disposition: A | Discharge status: Home / Self Care | Payer: Medicare Other | Source: Ambulatory Visit | Attending: Radiation Oncology | Admitting: Radiation Oncology

## 2020-01-06 ENCOUNTER — Encounter: Payer: Self-pay | Admitting: Emergency Medicine

## 2020-01-06 ENCOUNTER — Other Ambulatory Visit: Payer: Self-pay

## 2020-01-06 ENCOUNTER — Telehealth: Payer: Self-pay

## 2020-01-06 DIAGNOSIS — Z66 Do not resuscitate: Secondary | ICD-10-CM | POA: Diagnosis present

## 2020-01-06 DIAGNOSIS — K649 Unspecified hemorrhoids: Secondary | ICD-10-CM | POA: Diagnosis present

## 2020-01-06 DIAGNOSIS — N179 Acute kidney failure, unspecified: Secondary | ICD-10-CM | POA: Diagnosis present

## 2020-01-06 DIAGNOSIS — Z85118 Personal history of other malignant neoplasm of bronchus and lung: Secondary | ICD-10-CM

## 2020-01-06 DIAGNOSIS — K219 Gastro-esophageal reflux disease without esophagitis: Secondary | ICD-10-CM | POA: Diagnosis present

## 2020-01-06 DIAGNOSIS — R110 Nausea: Secondary | ICD-10-CM

## 2020-01-06 DIAGNOSIS — Z20822 Contact with and (suspected) exposure to covid-19: Secondary | ICD-10-CM | POA: Diagnosis present

## 2020-01-06 DIAGNOSIS — Z888 Allergy status to other drugs, medicaments and biological substances status: Secondary | ICD-10-CM

## 2020-01-06 DIAGNOSIS — K5733 Diverticulitis of large intestine without perforation or abscess with bleeding: Secondary | ICD-10-CM | POA: Diagnosis not present

## 2020-01-06 DIAGNOSIS — E860 Dehydration: Secondary | ICD-10-CM | POA: Diagnosis present

## 2020-01-06 DIAGNOSIS — E871 Hypo-osmolality and hyponatremia: Secondary | ICD-10-CM | POA: Diagnosis not present

## 2020-01-06 DIAGNOSIS — Z510 Encounter for antineoplastic radiation therapy: Secondary | ICD-10-CM | POA: Insufficient documentation

## 2020-01-06 DIAGNOSIS — Z79890 Hormone replacement therapy (postmenopausal): Secondary | ICD-10-CM

## 2020-01-06 DIAGNOSIS — Z8249 Family history of ischemic heart disease and other diseases of the circulatory system: Secondary | ICD-10-CM

## 2020-01-06 DIAGNOSIS — Z8542 Personal history of malignant neoplasm of other parts of uterus: Secondary | ICD-10-CM

## 2020-01-06 DIAGNOSIS — E890 Postprocedural hypothyroidism: Secondary | ICD-10-CM | POA: Diagnosis present

## 2020-01-06 DIAGNOSIS — Z85850 Personal history of malignant neoplasm of thyroid: Secondary | ICD-10-CM

## 2020-01-06 DIAGNOSIS — Z79899 Other long term (current) drug therapy: Secondary | ICD-10-CM

## 2020-01-06 DIAGNOSIS — Z87891 Personal history of nicotine dependence: Secondary | ICD-10-CM

## 2020-01-06 DIAGNOSIS — J432 Centrilobular emphysema: Secondary | ICD-10-CM | POA: Diagnosis present

## 2020-01-06 DIAGNOSIS — I129 Hypertensive chronic kidney disease with stage 1 through stage 4 chronic kidney disease, or unspecified chronic kidney disease: Secondary | ICD-10-CM | POA: Diagnosis present

## 2020-01-06 DIAGNOSIS — Z7982 Long term (current) use of aspirin: Secondary | ICD-10-CM

## 2020-01-06 DIAGNOSIS — H548 Legal blindness, as defined in USA: Secondary | ICD-10-CM | POA: Diagnosis present

## 2020-01-06 DIAGNOSIS — N1831 Chronic kidney disease, stage 3a: Secondary | ICD-10-CM | POA: Diagnosis present

## 2020-01-06 DIAGNOSIS — M8580 Other specified disorders of bone density and structure, unspecified site: Secondary | ICD-10-CM | POA: Diagnosis present

## 2020-01-06 DIAGNOSIS — F419 Anxiety disorder, unspecified: Secondary | ICD-10-CM | POA: Diagnosis present

## 2020-01-06 DIAGNOSIS — C3412 Malignant neoplasm of upper lobe, left bronchus or lung: Secondary | ICD-10-CM | POA: Insufficient documentation

## 2020-01-06 DIAGNOSIS — E269 Hyperaldosteronism, unspecified: Secondary | ICD-10-CM | POA: Diagnosis present

## 2020-01-06 DIAGNOSIS — Z91040 Latex allergy status: Secondary | ICD-10-CM

## 2020-01-06 LAB — CBC
HCT: 32.5 % — ABNORMAL LOW (ref 36.0–46.0)
Hemoglobin: 11.7 g/dL — ABNORMAL LOW (ref 12.0–15.0)
MCH: 32.4 pg (ref 26.0–34.0)
MCHC: 36 g/dL (ref 30.0–36.0)
MCV: 90 fL (ref 80.0–100.0)
Platelets: 295 10*3/uL (ref 150–400)
RBC: 3.61 MIL/uL — ABNORMAL LOW (ref 3.87–5.11)
RDW: 12.5 % (ref 11.5–15.5)
WBC: 9.4 10*3/uL (ref 4.0–10.5)
nRBC: 0 % (ref 0.0–0.2)

## 2020-01-06 MED ORDER — ONDANSETRON 4 MG PO TBDP: 4.0000 mg | ORAL_TABLET | Freq: Three times a day (TID) | ORAL | 0 refills | Status: DC | PRN

## 2020-01-06 NOTE — Addendum Note (Signed)
Addended by: Olin Hauser on: 01/06/2020 06:56 PM   Modules accepted: Orders

## 2020-01-06 NOTE — ED Notes (Signed)
Patient assisted to the bathroom. Patient with small maroon colored stool.

## 2020-01-06 NOTE — ED Triage Notes (Signed)
First Nurse: patient brought in by ems from home. Patient with complaint of lower abdominal cramping with nausea and vomiting that started this morning. Patient took nausea medication times two at home with no relief.  Patient has lung cancer and is getting ready to start radiation treatment.

## 2020-01-06 NOTE — Telephone Encounter (Signed)
Called patient to get more detail information she had sharp LLQ abdominal pain extreme nausea and diarrhea going on from week --had SBRT CT done today --and feeling like passing out and can't even bend down to use the bathroom --sound very exhausted when I call her so advised her that she might be dehydrated and needed might be IV --she was concerned about her spouse who is very sick --advised her that it will be beneficiary to call EMS -verbalized understanding and appreciate advise and will call EMS.

## 2020-01-06 NOTE — Telephone Encounter (Signed)
Copied from [place] [phone]. Topic: General - Inquiry >> Jan 06, 2020  2:34 PM Greggory Keen D wrote: Reason for CRM: Pt called asking that Dr. Raliegh Ip call her back asap for extreme vomiting and diarrhea.  She did nit want to speak to a nurse.   [phone]

## 2020-01-06 NOTE — ED Triage Notes (Signed)
See First nurse note for EMS report. Pt states that she has felt badly most of the day.

## 2020-01-06 NOTE — Telephone Encounter (Signed)
Copied from [place] [phone]. Topic: General - Inquiry >> Jan 06, 2020  2:34 PM Greggory Keen D wrote: Reason for CRM: Pt called asking that Dr. Raliegh Ip call her back asap for extreme vomiting and diarrhea.  She did nit want to speak to a nurse.   [phone]

## 2020-01-06 NOTE — Telephone Encounter (Signed)
Called patient back.  She called EMT, they evaluated her, but determined not significant enough dehydration to warrant IV fluid or ED evaluation.  She still has nausea and diarrhea.  I advised she should have requested a Virtual appointment. She did not ask for apt, since she did not think she could come in to office with these symptoms.  I advised her that I would be willing to send rx Zofran for nausea now as a courtesy and in future she should schedule virtual visit or go to hospital ED / Urgent Care if severe or worsening symptoms, and that this brief phone call is not a substitute for an actual full medical evaluation.  She understands. And will check next week if not better can schedule f/u.  Please contact her to follow-up Monday 10/18 and schedule follow-up if not improving.  [name], [facility] 01/06/2020, 6:56 PM

## 2020-01-07 ENCOUNTER — Emergency Department: Payer: Medicare Other

## 2020-01-07 ENCOUNTER — Other Ambulatory Visit: Payer: Self-pay | Admitting: Family Medicine

## 2020-01-07 ENCOUNTER — Inpatient Hospital Stay
Admission: EM | Admit: 2020-01-07 | Discharge: 2020-01-09 | DRG: 378 | Disposition: A | Discharge status: Home / Self Care | Payer: Medicare Other | Attending: Internal Medicine | Admitting: Internal Medicine

## 2020-01-07 DIAGNOSIS — N179 Acute kidney failure, unspecified: Secondary | ICD-10-CM

## 2020-01-07 DIAGNOSIS — N189 Chronic kidney disease, unspecified: Secondary | ICD-10-CM | POA: Diagnosis not present

## 2020-01-07 DIAGNOSIS — Z79890 Hormone replacement therapy (postmenopausal): Secondary | ICD-10-CM | POA: Diagnosis not present

## 2020-01-07 DIAGNOSIS — E871 Hypo-osmolality and hyponatremia: Secondary | ICD-10-CM | POA: Diagnosis present

## 2020-01-07 DIAGNOSIS — E269 Hyperaldosteronism, unspecified: Secondary | ICD-10-CM

## 2020-01-07 DIAGNOSIS — I129 Hypertensive chronic kidney disease with stage 1 through stage 4 chronic kidney disease, or unspecified chronic kidney disease: Secondary | ICD-10-CM

## 2020-01-07 DIAGNOSIS — N183 Chronic kidney disease, stage 3 unspecified: Secondary | ICD-10-CM | POA: Diagnosis present

## 2020-01-07 DIAGNOSIS — M8580 Other specified disorders of bone density and structure, unspecified site: Secondary | ICD-10-CM | POA: Diagnosis present

## 2020-01-07 DIAGNOSIS — K219 Gastro-esophageal reflux disease without esophagitis: Secondary | ICD-10-CM | POA: Diagnosis present

## 2020-01-07 DIAGNOSIS — E890 Postprocedural hypothyroidism: Secondary | ICD-10-CM | POA: Diagnosis present

## 2020-01-07 DIAGNOSIS — Z8542 Personal history of malignant neoplasm of other parts of uterus: Secondary | ICD-10-CM | POA: Diagnosis not present

## 2020-01-07 DIAGNOSIS — K5733 Diverticulitis of large intestine without perforation or abscess with bleeding: Secondary | ICD-10-CM | POA: Diagnosis present

## 2020-01-07 DIAGNOSIS — Z85118 Personal history of other malignant neoplasm of bronchus and lung: Secondary | ICD-10-CM | POA: Diagnosis not present

## 2020-01-07 DIAGNOSIS — K5732 Diverticulitis of large intestine without perforation or abscess without bleeding: Secondary | ICD-10-CM

## 2020-01-07 DIAGNOSIS — C3490 Malignant neoplasm of unspecified part of unspecified bronchus or lung: Secondary | ICD-10-CM | POA: Diagnosis present

## 2020-01-07 DIAGNOSIS — Z87891 Personal history of nicotine dependence: Secondary | ICD-10-CM | POA: Diagnosis not present

## 2020-01-07 DIAGNOSIS — N1831 Chronic kidney disease, stage 3a: Secondary | ICD-10-CM | POA: Diagnosis present

## 2020-01-07 DIAGNOSIS — Z20822 Contact with and (suspected) exposure to covid-19: Secondary | ICD-10-CM | POA: Diagnosis present

## 2020-01-07 DIAGNOSIS — K649 Unspecified hemorrhoids: Secondary | ICD-10-CM | POA: Diagnosis present

## 2020-01-07 DIAGNOSIS — R1030 Lower abdominal pain, unspecified: Secondary | ICD-10-CM

## 2020-01-07 DIAGNOSIS — H548 Legal blindness, as defined in USA: Secondary | ICD-10-CM | POA: Diagnosis present

## 2020-01-07 DIAGNOSIS — K5720 Diverticulitis of large intestine with perforation and abscess without bleeding: Secondary | ICD-10-CM

## 2020-01-07 DIAGNOSIS — K5792 Diverticulitis of intestine, part unspecified, without perforation or abscess without bleeding: Secondary | ICD-10-CM

## 2020-01-07 DIAGNOSIS — E860 Dehydration: Secondary | ICD-10-CM | POA: Diagnosis present

## 2020-01-07 DIAGNOSIS — I10 Essential (primary) hypertension: Secondary | ICD-10-CM

## 2020-01-07 DIAGNOSIS — Z79899 Other long term (current) drug therapy: Secondary | ICD-10-CM | POA: Diagnosis not present

## 2020-01-07 DIAGNOSIS — Z8249 Family history of ischemic heart disease and other diseases of the circulatory system: Secondary | ICD-10-CM | POA: Diagnosis not present

## 2020-01-07 DIAGNOSIS — Z7982 Long term (current) use of aspirin: Secondary | ICD-10-CM | POA: Diagnosis not present

## 2020-01-07 DIAGNOSIS — J432 Centrilobular emphysema: Secondary | ICD-10-CM | POA: Diagnosis present

## 2020-01-07 DIAGNOSIS — F419 Anxiety disorder, unspecified: Secondary | ICD-10-CM | POA: Diagnosis present

## 2020-01-07 DIAGNOSIS — Z85850 Personal history of malignant neoplasm of thyroid: Secondary | ICD-10-CM | POA: Diagnosis not present

## 2020-01-07 DIAGNOSIS — Z66 Do not resuscitate: Secondary | ICD-10-CM | POA: Diagnosis present

## 2020-01-07 LAB — RESPIRATORY PANEL BY RT PCR (FLU A&B, COVID)
Influenza A by PCR: NEGATIVE
Influenza B by PCR: NEGATIVE
SARS Coronavirus 2 by RT PCR: NEGATIVE

## 2020-01-07 LAB — URINALYSIS, COMPLETE (UACMP) WITH MICROSCOPIC
Bacteria, UA: NONE SEEN
Bilirubin Urine: NEGATIVE
Glucose, UA: NEGATIVE mg/dL
Ketones, ur: 5 mg/dL — AB
Leukocytes,Ua: NEGATIVE
Nitrite: NEGATIVE
Protein, ur: NEGATIVE mg/dL
Specific Gravity, Urine: 1.003 — ABNORMAL LOW (ref 1.005–1.030)
pH: 6 (ref 5.0–8.0)

## 2020-01-07 LAB — COMPREHENSIVE METABOLIC PANEL
ALT: 19 U/L (ref 0–44)
AST: 25 U/L (ref 15–41)
Albumin: 3.8 g/dL (ref 3.5–5.0)
Alkaline Phosphatase: 73 U/L (ref 38–126)
Anion gap: 14 (ref 5–15)
BUN: 33 mg/dL — ABNORMAL HIGH (ref 8–23)
CO2: 20 mmol/L — ABNORMAL LOW (ref 22–32)
Calcium: 9.2 mg/dL (ref 8.9–10.3)
Chloride: 91 mmol/L — ABNORMAL LOW (ref 98–111)
Creatinine, Ser: 1.81 mg/dL — ABNORMAL HIGH (ref 0.44–1.00)
GFR, Estimated: 26 mL/min — ABNORMAL LOW (ref >60)
Glucose, Bld: 123 mg/dL — ABNORMAL HIGH (ref 70–99)
Potassium: 4.4 mmol/L (ref 3.5–5.1)
Sodium: 125 mmol/L — ABNORMAL LOW (ref 135–145)
Total Bilirubin: 1.4 mg/dL — ABNORMAL HIGH (ref 0.3–1.2)
Total Protein: 7.2 g/dL (ref 6.5–8.1)

## 2020-01-07 LAB — LIPASE, BLOOD: Lipase: 29 U/L (ref 11–51)

## 2020-01-07 LAB — TROPONIN I (HIGH SENSITIVITY)
Troponin I (High Sensitivity): 13 ng/L (ref <18)
Troponin I (High Sensitivity): 13 ng/L (ref <18)

## 2020-01-07 MED ORDER — CALCIUM CARBONATE-VITAMIN D 500-200 MG-UNIT PO TABS
1.0000 | ORAL_TABLET | Freq: Every day | ORAL | Status: DC
  Administered 2020-01-08 – 2020-01-09 (×2): 1 via ORAL
  Filled 2020-01-07 (×3): qty 1

## 2020-01-07 MED ORDER — ASCORBIC ACID 500 MG PO TABS
500.0000 mg | ORAL_TABLET | Freq: Every day | ORAL | Status: DC
  Administered 2020-01-08 – 2020-01-09 (×2): 500 mg via ORAL
  Filled 2020-01-07 (×3): qty 1

## 2020-01-07 MED ORDER — SODIUM CHLORIDE 0.9 % IV SOLN: INTRAVENOUS | Status: DC

## 2020-01-07 MED ORDER — SODIUM CHLORIDE 0.9 % IV BOLUS
1000.0000 mL | Freq: Once | INTRAVENOUS | Status: AC
  Administered 2020-01-07: 1000 mL via INTRAVENOUS

## 2020-01-07 MED ORDER — NEBIVOLOL HCL 5 MG PO TABS
5.0000 mg | ORAL_TABLET | Freq: Every day | ORAL | Status: DC
  Administered 2020-01-07 – 2020-01-09 (×3): 5 mg via ORAL
  Filled 2020-01-07 (×3): qty 1

## 2020-01-07 MED ORDER — ASPIRIN EC 81 MG PO TBEC
81.0000 mg | DELAYED_RELEASE_TABLET | Freq: Every day | ORAL | Status: DC
  Filled 2020-01-07: qty 1

## 2020-01-07 MED ORDER — ONDANSETRON HCL 4 MG/2ML IJ SOLN
4.0000 mg | Freq: Once | INTRAMUSCULAR | Status: AC
  Administered 2020-01-07: 4 mg via INTRAVENOUS
  Filled 2020-01-07: qty 2

## 2020-01-07 MED ORDER — PANTOPRAZOLE SODIUM 40 MG IV SOLR
40.0000 mg | INTRAVENOUS | Status: DC
  Administered 2020-01-07 – 2020-01-08 (×2): 40 mg via INTRAVENOUS
  Filled 2020-01-07 (×2): qty 40

## 2020-01-07 MED ORDER — MORPHINE SULFATE (PF) 4 MG/ML IV SOLN
4.0000 mg | Freq: Once | INTRAVENOUS | Status: AC
  Administered 2020-01-07: 4 mg via INTRAVENOUS
  Filled 2020-01-07: qty 1

## 2020-01-07 MED ORDER — ENALAPRIL MALEATE 2.5 MG PO TABS
2.5000 mg | ORAL_TABLET | Freq: Every day | ORAL | Status: DC
  Administered 2020-01-07 – 2020-01-08 (×2): 2.5 mg via ORAL
  Filled 2020-01-07 (×2): qty 1

## 2020-01-07 MED ORDER — ONDANSETRON HCL 4 MG PO TABS: 4.0000 mg | ORAL_TABLET | Freq: Four times a day (QID) | ORAL | Status: DC | PRN

## 2020-01-07 MED ORDER — MORPHINE SULFATE (PF) 2 MG/ML IV SOLN: 2.0000 mg | INTRAVENOUS | Status: DC | PRN

## 2020-01-07 MED ORDER — LEVOTHYROXINE SODIUM 88 MCG PO TABS
88.0000 ug | ORAL_TABLET | Freq: Every day | ORAL | Status: DC
  Administered 2020-01-07 – 2020-01-09 (×3): 88 ug via ORAL
  Filled 2020-01-07 (×4): qty 1

## 2020-01-07 MED ORDER — ADULT MULTIVITAMIN W/MINERALS CH
1.0000 | ORAL_TABLET | Freq: Every day | ORAL | Status: DC
  Administered 2020-01-08 – 2020-01-09 (×2): 1 via ORAL
  Filled 2020-01-07 (×3): qty 1

## 2020-01-07 MED ORDER — NIFEDIPINE ER 60 MG PO TB24
60.0000 mg | ORAL_TABLET | Freq: Every evening | ORAL | Status: DC
  Administered 2020-01-07 – 2020-01-08 (×2): 60 mg via ORAL
  Filled 2020-01-07 (×3): qty 1

## 2020-01-07 MED ORDER — ONDANSETRON HCL 4 MG/2ML IJ SOLN: 4.0000 mg | Freq: Four times a day (QID) | INTRAMUSCULAR | Status: DC | PRN

## 2020-01-07 MED ORDER — METRONIDAZOLE IN NACL 5-0.79 MG/ML-% IV SOLN
500.0000 mg | Freq: Once | INTRAVENOUS | Status: AC
  Administered 2020-01-07: 500 mg via INTRAVENOUS
  Filled 2020-01-07: qty 100

## 2020-01-07 MED ORDER — PIPERACILLIN-TAZOBACTAM 3.375 G IVPB
3.3750 g | Freq: Three times a day (TID) | INTRAVENOUS | Status: DC
  Administered 2020-01-07 – 2020-01-09 (×6): 3.375 g via INTRAVENOUS
  Filled 2020-01-07 (×6): qty 50

## 2020-01-07 MED ORDER — ACETAMINOPHEN 325 MG PO TABS
650.0000 mg | ORAL_TABLET | Freq: Once | ORAL | Status: AC
  Administered 2020-01-07: 650 mg via ORAL
  Filled 2020-01-07: qty 2

## 2020-01-07 MED ORDER — ALBUTEROL SULFATE (2.5 MG/3ML) 0.083% IN NEBU: 2.5000 mg | INHALATION_SOLUTION | RESPIRATORY_TRACT | Status: DC | PRN

## 2020-01-07 MED ORDER — SODIUM CHLORIDE 0.9 % IV SOLN
2.0000 g | Freq: Once | INTRAVENOUS | Status: AC
  Administered 2020-01-07: 2 g via INTRAVENOUS
  Filled 2020-01-07: qty 20

## 2020-01-07 MED ORDER — LORAZEPAM 0.5 MG PO TABS
0.5000 mg | ORAL_TABLET | Freq: Every day | ORAL | Status: DC
  Administered 2020-01-07 – 2020-01-08 (×2): 0.5 mg via ORAL
  Filled 2020-01-07 (×2): qty 1

## 2020-01-07 NOTE — ED Notes (Signed)
Pt resting comfortably at this time. No distress noted. Pt given pillow and warm blanket. Pt states ABD pain is currently 6/10. Will speak to MD about pain medication.

## 2020-01-07 NOTE — H&P (Addendum)
History and Physical    YASUKO LAPAGE ZHY:[id number] DOB: 26-Sep-1940 DOA: 01/07/2020  PCP: Olin Hauser, DO   Patient coming from: Home  I have personally briefly reviewed patient's old medical records in [place]  Chief Complaint: Left lower quadrant pain  HPI: GINNIFER CREELMAN is a 79 y.o. female with medical history significant for GERD, COPD, lung cancer status post recent evaluation for radiation therapy and stage IV chronic kidney disease who presents to the emergency room for evaluation of abdominal pain mostly in the left lower quadrant which she rates an 8 x 10 in intensity at its worst.  Pain started suddenly around noon the day prior to admission and she described it as an intense pain which made her double over and feel faint like she was going to pass out.  She states that she laid on the floor for about 45 minutes until she stopped feeling like she was going to pass out and then developed nausea, vomiting and loose watery stools.  Patient contacted her primary care provider who called in some Zofran which she took without any relief in her symptoms and so she called EMS and was brought to the emergency room. She has continued to have loose watery stools and had bloody stools with the last episode.  She feels "bad" but denies feeling dizzy or lightheaded.  She has had chills but denies having any fever.  She has no urinary symptoms. She denies having any cough,  palpitations, diaphoresis or headaches. While in the emergency room she developed an episode of midsternal chest pain which she rated 8 x 10 in intensity at its worst, pain radiated from her chest to her abdomen and she denied having any associated nausea with that episode.  She denies diaphoresis or palpitations. Per patient she thought it was gas pains. Labs show sodium 125, potassium 4.4, chloride 91, bicarb 20, glucose 123, BUN 33, creatinine 1.8, calcium 9.2, alk phos 73, albumin 3.8, lipase 29, AST 25, ALT  19, white count 9.4, hemoglobin 11.7, hematocrit 32.5, MCV 90, RDW 12.5, platelet count 295 Respiratory viral panel is negative CT scan of abdomen and pelvis shows mild acute sigmoid diverticulitis without abscess or free intraperitoneal air.  Unchanged 12 mm left lower lobe pulmonary nodule. Twelve-lead EKG reviewed by me shows sinus rhythm with an old anterior infarct   ED Course: Patient is a 79 year old female who was brought into the ER by EMS for evaluation of abdominal pain mostly in the left lower quadrant which was sudden in onset associated with nausea, vomiting and multiple episodes of loose watery stools with occasional blood.  She has had chills but denies having any fever.  She had a CT scan of abdomen and pelvis which showed acute left lower quadrant diverticulitis.  She received a dose of antibiotic in the ER and will be admitted to the hospital for further evaluation.  Review of Systems: As per HPI otherwise 10 point review of systems negative.    Past Medical History:  Diagnosis Date  . Anxiety    Related to HTN and vision loss  . Central retinal vein occlusion of left eye    legally blind in Left eye  . 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 Collagen vascular disease ([place])    uncharacterized.  has seen rheum.  positive RF, positive serologies for Sjogrens.  C-ANCA/P-ANCA neg, ANA  neg, anti-dsDNA neg, anti-SCL70 neg, anti-centromere Ab neg  .  History and Physical    KATHELYN GOMBOS ZHY:[id number] DOB: 04-23-1940 DOA: 01/07/2020  PCP: Olin Hauser, DO   Patient coming from: Home  I have personally briefly reviewed patient's old medical records in [place]  Chief Complaint: Left lower quadrant pain  HPI: SINTIA MCKISSIC is a 79 y.o. female with medical history significant for GERD, COPD, lung cancer status post recent evaluation for radiation therapy and stage IV chronic kidney disease who presents to the emergency room for evaluation of abdominal pain mostly in the left lower quadrant which she rates an 8 x 10 in intensity at its worst.  Pain started suddenly around noon the day prior to admission and she described it as an intense pain which made her double over and feel faint like she was going to pass out.  She states that she laid on the floor for about 45 minutes until she stopped feeling like she was going to pass out and then developed nausea, vomiting and loose watery stools.  Patient contacted her primary care provider who called in some Zofran which she took without any relief in her symptoms and so she called EMS and was brought to the emergency room. She has continued to have loose watery stools and had bloody stools with the last episode.  She feels "bad" but denies feeling dizzy or lightheaded.  She has had chills but denies having any fever.  She has no urinary symptoms. She denies having any cough,  palpitations, diaphoresis or headaches. While in the emergency room she developed an episode of midsternal chest pain which she rated 8 x 10 in intensity at its worst, pain radiated from her chest to her abdomen and she denied having any associated nausea with that episode.  She denies diaphoresis or palpitations. Per patient she thought it was gas pains. Labs show sodium 125, potassium 4.4, chloride 91, bicarb 20, glucose 123, BUN 33, creatinine 1.8, calcium 9.2, alk phos 73, albumin 3.8, lipase 29, AST 25, ALT  19, white count 9.4, hemoglobin 11.7, hematocrit 32.5, MCV 90, RDW 12.5, platelet count 295 Respiratory viral panel is negative CT scan of abdomen and pelvis shows mild acute sigmoid diverticulitis without abscess or free intraperitoneal air.  Unchanged 12 mm left lower lobe pulmonary nodule. Twelve-lead EKG reviewed by me shows sinus rhythm with an old anterior infarct   ED Course: Patient is a 79 year old female who was brought into the ER by EMS for evaluation of abdominal pain mostly in the left lower quadrant which was sudden in onset associated with nausea, vomiting and multiple episodes of loose watery stools with occasional blood.  She has had chills but denies having any fever.  She had a CT scan of abdomen and pelvis which showed acute left lower quadrant diverticulitis.  She received a dose of antibiotic in the ER and will be admitted to the hospital for further evaluation.  Review of Systems: As per HPI otherwise 10 point review of systems negative.    Past Medical History:  Diagnosis Date  . Anxiety    Related to HTN and vision loss  . Central retinal vein occlusion of left eye    legally blind in Left eye  . 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 Collagen vascular disease ([place])    uncharacterized.  has seen rheum.  positive RF, positive serologies for Sjogrens.  C-ANCA/P-ANCA neg, ANA  neg, anti-dsDNA neg, anti-SCL70 neg, anti-centromere Ab neg  .  History and Physical    KATHELYN GOMBOS ZHY:[id number] DOB: 04-23-1940 DOA: 01/07/2020  PCP: Olin Hauser, DO   Patient coming from: Home  I have personally briefly reviewed patient's old medical records in [place]  Chief Complaint: Left lower quadrant pain  HPI: SINTIA MCKISSIC is a 79 y.o. female with medical history significant for GERD, COPD, lung cancer status post recent evaluation for radiation therapy and stage IV chronic kidney disease who presents to the emergency room for evaluation of abdominal pain mostly in the left lower quadrant which she rates an 8 x 10 in intensity at its worst.  Pain started suddenly around noon the day prior to admission and she described it as an intense pain which made her double over and feel faint like she was going to pass out.  She states that she laid on the floor for about 45 minutes until she stopped feeling like she was going to pass out and then developed nausea, vomiting and loose watery stools.  Patient contacted her primary care provider who called in some Zofran which she took without any relief in her symptoms and so she called EMS and was brought to the emergency room. She has continued to have loose watery stools and had bloody stools with the last episode.  She feels "bad" but denies feeling dizzy or lightheaded.  She has had chills but denies having any fever.  She has no urinary symptoms. She denies having any cough,  palpitations, diaphoresis or headaches. While in the emergency room she developed an episode of midsternal chest pain which she rated 8 x 10 in intensity at its worst, pain radiated from her chest to her abdomen and she denied having any associated nausea with that episode.  She denies diaphoresis or palpitations. Per patient she thought it was gas pains. Labs show sodium 125, potassium 4.4, chloride 91, bicarb 20, glucose 123, BUN 33, creatinine 1.8, calcium 9.2, alk phos 73, albumin 3.8, lipase 29, AST 25, ALT  19, white count 9.4, hemoglobin 11.7, hematocrit 32.5, MCV 90, RDW 12.5, platelet count 295 Respiratory viral panel is negative CT scan of abdomen and pelvis shows mild acute sigmoid diverticulitis without abscess or free intraperitoneal air.  Unchanged 12 mm left lower lobe pulmonary nodule. Twelve-lead EKG reviewed by me shows sinus rhythm with an old anterior infarct   ED Course: Patient is a 79 year old female who was brought into the ER by EMS for evaluation of abdominal pain mostly in the left lower quadrant which was sudden in onset associated with nausea, vomiting and multiple episodes of loose watery stools with occasional blood.  She has had chills but denies having any fever.  She had a CT scan of abdomen and pelvis which showed acute left lower quadrant diverticulitis.  She received a dose of antibiotic in the ER and will be admitted to the hospital for further evaluation.  Review of Systems: As per HPI otherwise 10 point review of systems negative.    Past Medical History:  Diagnosis Date  . Anxiety    Related to HTN and vision loss  . Central retinal vein occlusion of left eye    legally blind in Left eye  . 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 Collagen vascular disease ([place])    uncharacterized.  has seen rheum.  positive RF, positive serologies for Sjogrens.  C-ANCA/P-ANCA neg, ANA  neg, anti-dsDNA neg, anti-SCL70 neg, anti-centromere Ab neg  .  History and Physical    KATHELYN GOMBOS ZHY:[id number] DOB: 04-23-1940 DOA: 01/07/2020  PCP: Olin Hauser, DO   Patient coming from: Home  I have personally briefly reviewed patient's old medical records in [place]  Chief Complaint: Left lower quadrant pain  HPI: SINTIA MCKISSIC is a 79 y.o. female with medical history significant for GERD, COPD, lung cancer status post recent evaluation for radiation therapy and stage IV chronic kidney disease who presents to the emergency room for evaluation of abdominal pain mostly in the left lower quadrant which she rates an 8 x 10 in intensity at its worst.  Pain started suddenly around noon the day prior to admission and she described it as an intense pain which made her double over and feel faint like she was going to pass out.  She states that she laid on the floor for about 45 minutes until she stopped feeling like she was going to pass out and then developed nausea, vomiting and loose watery stools.  Patient contacted her primary care provider who called in some Zofran which she took without any relief in her symptoms and so she called EMS and was brought to the emergency room. She has continued to have loose watery stools and had bloody stools with the last episode.  She feels "bad" but denies feeling dizzy or lightheaded.  She has had chills but denies having any fever.  She has no urinary symptoms. She denies having any cough,  palpitations, diaphoresis or headaches. While in the emergency room she developed an episode of midsternal chest pain which she rated 8 x 10 in intensity at its worst, pain radiated from her chest to her abdomen and she denied having any associated nausea with that episode.  She denies diaphoresis or palpitations. Per patient she thought it was gas pains. Labs show sodium 125, potassium 4.4, chloride 91, bicarb 20, glucose 123, BUN 33, creatinine 1.8, calcium 9.2, alk phos 73, albumin 3.8, lipase 29, AST 25, ALT  19, white count 9.4, hemoglobin 11.7, hematocrit 32.5, MCV 90, RDW 12.5, platelet count 295 Respiratory viral panel is negative CT scan of abdomen and pelvis shows mild acute sigmoid diverticulitis without abscess or free intraperitoneal air.  Unchanged 12 mm left lower lobe pulmonary nodule. Twelve-lead EKG reviewed by me shows sinus rhythm with an old anterior infarct   ED Course: Patient is a 79 year old female who was brought into the ER by EMS for evaluation of abdominal pain mostly in the left lower quadrant which was sudden in onset associated with nausea, vomiting and multiple episodes of loose watery stools with occasional blood.  She has had chills but denies having any fever.  She had a CT scan of abdomen and pelvis which showed acute left lower quadrant diverticulitis.  She received a dose of antibiotic in the ER and will be admitted to the hospital for further evaluation.  Review of Systems: As per HPI otherwise 10 point review of systems negative.    Past Medical History:  Diagnosis Date  . Anxiety    Related to HTN and vision loss  . Central retinal vein occlusion of left eye    legally blind in Left eye  . 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 Collagen vascular disease ([place])    uncharacterized.  has seen rheum.  positive RF, positive serologies for Sjogrens.  C-ANCA/P-ANCA neg, ANA  neg, anti-dsDNA neg, anti-SCL70 neg, anti-centromere Ab neg  .  History and Physical    KATHELYN GOMBOS ZHY:[id number] DOB: 04-23-1940 DOA: 01/07/2020  PCP: Olin Hauser, DO   Patient coming from: Home  I have personally briefly reviewed patient's old medical records in [place]  Chief Complaint: Left lower quadrant pain  HPI: SINTIA MCKISSIC is a 79 y.o. female with medical history significant for GERD, COPD, lung cancer status post recent evaluation for radiation therapy and stage IV chronic kidney disease who presents to the emergency room for evaluation of abdominal pain mostly in the left lower quadrant which she rates an 8 x 10 in intensity at its worst.  Pain started suddenly around noon the day prior to admission and she described it as an intense pain which made her double over and feel faint like she was going to pass out.  She states that she laid on the floor for about 45 minutes until she stopped feeling like she was going to pass out and then developed nausea, vomiting and loose watery stools.  Patient contacted her primary care provider who called in some Zofran which she took without any relief in her symptoms and so she called EMS and was brought to the emergency room. She has continued to have loose watery stools and had bloody stools with the last episode.  She feels "bad" but denies feeling dizzy or lightheaded.  She has had chills but denies having any fever.  She has no urinary symptoms. She denies having any cough,  palpitations, diaphoresis or headaches. While in the emergency room she developed an episode of midsternal chest pain which she rated 8 x 10 in intensity at its worst, pain radiated from her chest to her abdomen and she denied having any associated nausea with that episode.  She denies diaphoresis or palpitations. Per patient she thought it was gas pains. Labs show sodium 125, potassium 4.4, chloride 91, bicarb 20, glucose 123, BUN 33, creatinine 1.8, calcium 9.2, alk phos 73, albumin 3.8, lipase 29, AST 25, ALT  19, white count 9.4, hemoglobin 11.7, hematocrit 32.5, MCV 90, RDW 12.5, platelet count 295 Respiratory viral panel is negative CT scan of abdomen and pelvis shows mild acute sigmoid diverticulitis without abscess or free intraperitoneal air.  Unchanged 12 mm left lower lobe pulmonary nodule. Twelve-lead EKG reviewed by me shows sinus rhythm with an old anterior infarct   ED Course: Patient is a 79 year old female who was brought into the ER by EMS for evaluation of abdominal pain mostly in the left lower quadrant which was sudden in onset associated with nausea, vomiting and multiple episodes of loose watery stools with occasional blood.  She has had chills but denies having any fever.  She had a CT scan of abdomen and pelvis which showed acute left lower quadrant diverticulitis.  She received a dose of antibiotic in the ER and will be admitted to the hospital for further evaluation.  Review of Systems: As per HPI otherwise 10 point review of systems negative.    Past Medical History:  Diagnosis Date  . Anxiety    Related to HTN and vision loss  . Central retinal vein occlusion of left eye    legally blind in Left eye  . 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 Collagen vascular disease ([place])    uncharacterized.  has seen rheum.  positive RF, positive serologies for Sjogrens.  C-ANCA/P-ANCA neg, ANA  neg, anti-dsDNA neg, anti-SCL70 neg, anti-centromere Ab neg  .

## 2020-01-07 NOTE — ED Notes (Signed)
Patient given warm blanket at this time.

## 2020-01-07 NOTE — Progress Notes (Signed)
Pharmacy Antibiotic Note  Margaret Black is a 79 y.o. female admitted on 01/07/2020 with acute diverticulitis.  Pharmacy has been consulted for Zosyn dosing.  Plan: Zosyn 3.375g IV q8h (4 hour infusion).  Height: 5\' 5"  (165.1 cm) Weight: 56.7 kg (125 lb) IBW/kg (Calculated) : 57  Temp (24hrs), Avg:98.9 F (37.2 C), Min:98.8 F (37.1 C), Max:98.9 F (37.2 C)  Recent Labs  Lab 01/06/20 2312  WBC 9.4  CREATININE 1.81*    Estimated Creatinine Clearance: 22.6 mL/min (A) (by C-G formula based on SCr of 1.81 mg/dL (H)).    Allergies  Allergen Reactions  . Lactase Diarrhea  . Atenolol Rash  . Latex Rash    Antimicrobials this admission: Ceftriaxone/metronidazole x1 10/16  Zosyn 10/16 >>  Dose adjustments this admission:   Microbiology results: No cultures available at this time  Thank you for allowing pharmacy to be a part of this patient's care.  Rocky Morel 01/07/2020 10:09 AM

## 2020-01-07 NOTE — ED Notes (Signed)
Per hospitalist pt can have broth to try clear liquid diet.

## 2020-01-07 NOTE — ED Notes (Signed)
Pt tolerated clear liquid diet, pt denies any ABD pain at this time. Report to recieving nurse on [place]. Pt stable.

## 2020-01-07 NOTE — ED Notes (Addendum)
Pt called out with new onset of CP. Mid chest, radiating across chest both L and R. Pt appears weak and diaphoretic. EKG done. Pt c/o nausea and "feeling a need to belch". Admitting MD messaged via secure chat.

## 2020-01-07 NOTE — ED Provider Notes (Signed)
[facility] Emergency Department Provider Note  ____________________________________________  Time seen: Approximately 5:02 AM  I have reviewed the triage vital signs and the nursing notes.   HISTORY  Chief Complaint Abdominal Pain    HPI Margaret Black is a 79 y.o. female with a history of hypertension GERD COPD CKD who comes the ED from home due to lower abdominal cramping pain that is severe associated nausea vomiting and diarrhea, nonradiating.  Started about 12:30 PM on October 15, constant, waxing and waning, worse with movement, no alleviating factors.  Patient denies fever but has chills.      Past Medical History:  Diagnosis Date  . Anxiety    Related to HTN and vision loss  . Central retinal vein occlusion of left eye    legally blind in Left eye  . 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 Collagen vascular disease ([place])    uncharacterized.  has seen rheum.  positive RF, positive serologies for Sjogrens.  C-ANCA/P-ANCA neg, ANA  neg, anti-dsDNA neg, anti-SCL70 neg, anti-centromere Ab neg  . COPD (chronic obstructive pulmonary disease) ([place])    quit smoking 1993  . Diverticulosis   . Dysfunction of eustachian tube   . Fatigue   . GERD (gastroesophageal reflux disease)   . Goiter, unspecified   . HLD (hyperlipidemia)   . HTN (hypertension)    difficult to control, hypertensive urgency hospitalizations, normal urine/plasma metanephrines and catecholamines, renal dopplers without stenosis  . Hyperaldosteronism    serum aldo/renin ration elevated (aldo 23.7, renin <0.15), CT abd 2010 with normal adrenals, dedicated adrenal CT without adrenal adenoma,   . Hypothyroid   . Insomnia    Related to HTN and vision loss  . Irritable bowel syndrome   . Leiomyosarcoma of uterus ([place]) 09/22/2014  . Lung cancer [facility])    s/p R lobectomy 2001  . Lupus ([place])   . Osteopenia   . Pulmonary hypertension ([place])    echo  [facility] 12/2008 with EF >5%, grade II diastolic dysfunction, RSVP >6mmHg, ACE level normal.  f/u echo at [facility] - EF 55-60%, mild MR, mild LAE, PASP 25 + RA (no pulm HTN)  . Thyroid cancer ([place]) 2014  . Urge incontinence   . Uterine cancer [facility])    s/p hysterectomy 2001     Patient Active Problem List   Diagnosis Date Noted  . Aortic atherosclerosis ([place]) 10/24/2019  . Centrilobular emphysema ([place]) 10/24/2019  . Abdominal pain 06/21/2019  . Anxiety 06/12/2017  . Rheumatoid factor positive 02/26/2017  . Brachial radiculitis 12/25/2016  . Closed traumatic dislocation of cervical vertebra 12/25/2016  . Weakness of limb 12/25/2016  . Insomnia 02/08/2015  . Carotid stenosis 02/08/2015  . Solitary pulmonary nodule 03/31/2014  . Systemic lupus ([place]) 12/15/2013  . Near syncope 05/03/2013  . Vertigo 08/09/2012  . History of lung cancer 07/14/2012  . Interstitial nephritis, acute 07/14/2012  . History of thyroid cancer 06/30/2012  . Livedo reticularis 06/11/2012  . Antiphospholipid antibody positive 06/11/2012  . CRVO (central retinal vein occlusion) 05/25/2012  . Rash 04/06/2012  . Syncope 09/29/2011  . Stage III chronic kidney disease ([place]) 10/09/2010  . HYPERALDOSTERONISM UNSPECIFIED 01/12/2009  . HYPERPLASIA, ADRENAL 01/12/2009  . HYPERTENSION, PULMONARY 01/04/2009  . Blindness of left eye 12/12/2008  . IBS 07/12/2007  . Hypercholesterolemia 04/27/2007  . INCONTINENCE, URGE 04/23/2007  . Postsurgical hypothyroidism 06/22/2006  . Benign hypertension with CKD (chronic kidney disease) stage III ([place]) 06/22/2006  .  GERD 06/22/2006  . Osteopenia 06/22/2006  . UTERINE CANCER, HX OF 06/22/2006  . DIVERTICULITIS, HX OF 06/22/2006     Past Surgical History:  Procedure Laterality Date  . ABDOMINAL HYSTERECTOMY  06/1999  . APPENDECTOMY  1958  . CHOLECYSTECTOMY  1982  . CT ABD W & PELVIS WO CM  2010   neg except diverticulosis  . CT chest  2007   enlarged thyroid with goiter  .  LOBECTOMY Right 04/1999   right lung, done in [place]  . lung nodule excision Left 2003   benign  . THYROIDECTOMY  2014  . TUBAL LIGATION  1975     Prior to Admission medications   Medication Sig Start Date End Date Taking? Authorizing Provider  albuterol (VENTOLIN HFA) 108 (90 Base) MCG/ACT inhaler Inhale 2 puffs into the lungs every 4 (four) hours as needed for wheezing or shortness of breath (cough). 10/24/19   Karamalegos, Devonne Doughty, DO  aspirin 81 MG tablet Take 81 mg by mouth daily.    [provider]  Aspirin-Calcium Carbonate 81-777 MG TABS Take by mouth.    [provider]  BYSTOLIC 5 MG tablet Take 1 tablet (5 mg total) by mouth daily. 08/23/19   Karamalegos, Devonne Doughty, DO  CALCIUM-VITAMIN D PO Take by mouth daily.    [provider]  enalapril (VASOTEC) 2.5 MG tablet Take 1 tablet (2.5 mg total) by mouth daily. 09/15/19   Karamalegos, Devonne Doughty, DO  LORazepam (ATIVAN) 0.5 MG tablet Take 1 tablet (0.5 mg total) by mouth at bedtime. 10/25/19   Karamalegos, Devonne Doughty, DO  Multiple Vitamin (MULTIVITAMIN) tablet Take 1 tablet by mouth daily.    [provider]  NIFEdipine (PROCARDIA XL/NIFEDICAL XL) 60 MG 24 hr tablet TAKE 1 TABLET EVERY EVENING 11/11/19   Karamalegos, Alexander J, DO  ondansetron (ZOFRAN ODT) 4 MG disintegrating tablet Take 1-2 tablets (4-8 mg total) by mouth every 8 (eight) hours as needed for nausea or vomiting. 01/06/20   Karamalegos, Devonne Doughty, DO  spironolactone (ALDACTONE) 25 MG tablet TAKE 1 TABLET TWICE A DAY 01/07/20   Karamalegos, Devonne Doughty, DO  SYNTHROID 88 MCG tablet Take 1 tablet (88 mcg total) by mouth daily before breakfast. 09/15/19   Karamalegos, Devonne Doughty, DO  vitamin C (ASCORBIC ACID) 500 MG tablet Take 500 mg by mouth daily.    [provider]     Allergies Lactase, Atenolol, and Latex   Family History  Problem Relation Age of Onset  . Hypertension Mother   . Pneumonia Father   . Alcohol abuse  Father     Social History Social History   Tobacco Use  . Smoking status: Former Smoker    Types: Cigarettes    Quit date: 07/04/1991    Years since quitting: 28.5  . Smokeless tobacco: Former Systems developer  . Tobacco comment: Quit in early 1980's.  Vaping Use  . Vaping Use: Never used  Substance Use Topics  . Alcohol use: No  . Drug use: No    Review of Systems  Constitutional:   No fever positive chills.  ENT:   No sore throat. No rhinorrhea. Cardiovascular:   No chest pain or syncope. Respiratory:   No dyspnea or cough. Gastrointestinal:   Positive as above for abdominal pain, vomiting and diarrhea.  Musculoskeletal:   Negative for focal pain or swelling All other systems reviewed and are negative except as documented above in ROS and HPI.  ____________________________________________   PHYSICAL EXAM:  VITAL SIGNS:  ED Triage Vitals  Enc Vitals Group     BP 01/07/20 0110 (!) 122/48     Pulse Rate 01/07/20 0110 66     Resp 01/07/20 0110 16     Temp 01/07/20 0110 98.9 F (37.2 C)     Temp Source 01/07/20 0110 Oral     SpO2 01/07/20 0110 100 %     Weight 01/06/20 2307 125 lb (56.7 kg)     Height 01/06/20 2307 5\' 5"  (1.651 m)     Head Circumference --      Peak Flow --      Pain Score 01/06/20 2305 7     Pain Loc --      Pain Edu? --      Excl. in [place]? --     Vital signs reviewed, nursing assessments reviewed.   Constitutional:   Alert and oriented. Non-toxic appearance. Eyes:   Conjunctivae are normal. EOMI. PERRL. ENT      Head:   Normocephalic and atraumatic.      Nose:   Wearing a mask.      Mouth/Throat:   Wearing a mask.      Neck:   No meningismus. Full ROM. Hematological/Lymphatic/Immunilogical:   No cervical lymphadenopathy. Cardiovascular:   RRR. Symmetric bilateral radial and DP pulses.  No murmurs. Cap refill less than 2 seconds. Respiratory:   Normal respiratory effort without tachypnea/retractions. Breath sounds are clear and equal bilaterally. No  wheezes/rales/rhonchi. Gastrointestinal:   Soft with suprapubic and left lower quadrant tenderness. Non distended. There is no CVA tenderness.  No rebound, rigidity, or guarding.  Musculoskeletal:   Normal range of motion in all extremities. No joint effusions.  No lower extremity tenderness.  No edema. Neurologic:   Normal speech and language.  Motor grossly intact. No acute focal neurologic deficits are appreciated.  Skin:    Skin is warm, dry and intact. No rash noted.  No petechiae, purpura, or bullae.  ____________________________________________    LABS (pertinent positives/negatives) (all labs ordered are listed, but only abnormal results are displayed) Labs Reviewed  COMPREHENSIVE METABOLIC PANEL - Abnormal; Notable for the following components:      Result Value   Sodium 125 (*)    Chloride 91 (*)    CO2 20 (*)    Glucose, Bld 123 (*)    BUN 33 (*)    Creatinine, Ser 1.81 (*)    Total Bilirubin 1.4 (*)    GFR, Estimated 26 (*)    All other components within normal limits  CBC - Abnormal; Notable for the following components:   RBC 3.61 (*)    Hemoglobin 11.7 (*)    HCT 32.5 (*)    All other components within normal limits  URINALYSIS, COMPLETE (UACMP) WITH MICROSCOPIC - Abnormal; Notable for the following components:   Color, Urine STRAW (*)    APPearance CLEAR (*)    Specific Gravity, Urine 1.003 (*)    Hgb urine dipstick SMALL (*)    Ketones, ur 5 (*)    All other components within normal limits  RESPIRATORY PANEL BY RT PCR (FLU A&B, COVID)  LIPASE, BLOOD   ____________________________________________   EKG    ____________________________________________    RADIOLOGY  CT ABDOMEN PELVIS WO CONTRAST  Result Date: 01/07/2020 CLINICAL DATA:  Abdominal pain EXAM: CT ABDOMEN AND PELVIS WITHOUT CONTRAST TECHNIQUE: Multidetector CT imaging of the abdomen and pelvis was performed following the standard protocol without IV contrast. COMPARISON:  12/30/2008  FINDINGS: LOWER CHEST: There is  left basilar atelectasis. Unchanged 12 mm nodule at the left lung base (series 3, image 12). HEPATOBILIARY: Normal hepatic contours. No intra- or extrahepatic biliary dilatation. Status post cholecystectomy. PANCREAS: Normal pancreas. No ductal dilatation or peripancreatic fluid collection. SPLEEN: Normal. ADRENALS/URINARY TRACT: The adrenal glands are normal. No hydronephrosis, nephroureterolithiasis or solid renal mass. The urinary bladder is normal for degree of distention STOMACH/BOWEL: There is no hiatal hernia. Normal duodenal course and caliber. No small bowel dilatation or inflammation. Sigmoid diverticulosis with mild surrounding inflammatory stranding. No free intraperitoneal air. Normal appendix. VASCULAR/LYMPHATIC: Normal course and caliber of the major abdominal vessels. No abdominal or pelvic lymphadenopathy. REPRODUCTIVE: Status post hysterectomy MUSCULOSKELETAL. No bony spinal canal stenosis or focal osseous abnormality. OTHER: None. IMPRESSION: 1. Mild acute sigmoid diverticulitis without abscess or free intraperitoneal air. 2. Unchanged 12 mm left lower lobe pulmonary nodule. Electronically Signed   By: Ulyses Jarred M.D.   On: 01/07/2020 05:23    ____________________________________________   PROCEDURES Procedures  ____________________________________________  DIFFERENTIAL DIAGNOSIS   Cystitis, diverticulitis, intra-abdominal abscess, appendicitis, kidney stone  CLINICAL IMPRESSION / ASSESSMENT AND PLAN / ED COURSE  Medications ordered in the ED: Medications  cefTRIAXone (ROCEPHIN) 2 g in sodium chloride 0.9 % 100 mL IVPB (has no administration in time range)  metroNIDAZOLE (FLAGYL) IVPB 500 mg (has no administration in time range)  acetaminophen (TYLENOL) tablet 650 mg (650 mg Oral Given 01/07/20 0034)  sodium chloride 0.9 % bolus 1,000 mL (1,000 mLs Intravenous New Bag/Given 01/07/20 0459)  morphine 4 MG/ML injection 4 mg (4 mg Intravenous  Given 01/07/20 0500)  ondansetron (ZOFRAN) injection 4 mg (4 mg Intravenous Given 01/07/20 0501)    Pertinent labs & imaging results that were available during my care of the patient were reviewed by me and considered in my medical decision making (see chart for details).  Margaret Black was evaluated in Emergency Department on 01/07/2020 for the symptoms described in the history of present illness. She was evaluated in the context of the global COVID-19 pandemic, which necessitated consideration that the patient might be at risk for infection with the SARS-CoV-2 virus that causes COVID-19. Institutional protocols and algorithms that pertain to the evaluation of patients at risk for COVID-19 are in a state of rapid change based on information released by regulatory bodies including the CDC and federal and state organizations. These policies and algorithms were followed during the patient's care in the ED.   Patient presents with severe lower abdominal pain, worse in the left lower quadrant, high suspicion for diverticulitis.  Give IV fluids for hydration, morphine 4 mg IV for pain control, Zofran IV for nausea.  Obtain CT scan of the abdomen and pelvis.  Due to severity of symptoms and hyponatremia, patient will likely need to be admitted and that she is feeling much better after initial treatment and CT scan is reassuring.  ----------------------------------------- 6:04 AM on 01/07/2020 -----------------------------------------  Pain is controlled.  CT does confirm sigmoid diverticulitis, currently uncomplicated.  Due to her age, severity of symptoms, and hyponatremia and dehydration, plan to hospitalize for initial management until improving.  I will order ceftriaxone and Flagyl as well.      ____________________________________________   FINAL CLINICAL IMPRESSION(S) / ED DIAGNOSES    Final diagnoses:  Lower abdominal pain  Hyponatremia  Sigmoid diverticulitis     ED Discharge  Orders    None      Portions of this note were generated with dragon dictation software. Dictation errors may occur despite best  attempts at proofreading.   Carrie Mew, MD 01/07/20 [phone]

## 2020-01-07 NOTE — ED Notes (Signed)
Pt up to bathroom at this time

## 2020-01-07 NOTE — ED Notes (Signed)
Patient given update on wait time. Patient verbalizes understanding.  

## 2020-01-07 NOTE — ED Notes (Signed)
Clear liquid diet ordered for pt. 

## 2020-01-08 DIAGNOSIS — K5792 Diverticulitis of intestine, part unspecified, without perforation or abscess without bleeding: Secondary | ICD-10-CM

## 2020-01-08 DIAGNOSIS — N179 Acute kidney failure, unspecified: Secondary | ICD-10-CM

## 2020-01-08 DIAGNOSIS — I10 Essential (primary) hypertension: Secondary | ICD-10-CM

## 2020-01-08 DIAGNOSIS — K5733 Diverticulitis of large intestine without perforation or abscess with bleeding: Secondary | ICD-10-CM | POA: Diagnosis not present

## 2020-01-08 DIAGNOSIS — E871 Hypo-osmolality and hyponatremia: Secondary | ICD-10-CM | POA: Diagnosis not present

## 2020-01-08 DIAGNOSIS — K5720 Diverticulitis of large intestine with perforation and abscess without bleeding: Secondary | ICD-10-CM

## 2020-01-08 DIAGNOSIS — N189 Chronic kidney disease, unspecified: Secondary | ICD-10-CM

## 2020-01-08 DIAGNOSIS — Z85118 Personal history of other malignant neoplasm of bronchus and lung: Secondary | ICD-10-CM

## 2020-01-08 LAB — CBC
HCT: 28.7 % — ABNORMAL LOW (ref 36.0–46.0)
Hemoglobin: 9.3 g/dL — ABNORMAL LOW (ref 12.0–15.0)
MCH: 31 pg (ref 26.0–34.0)
MCHC: 32.4 g/dL (ref 30.0–36.0)
MCV: 95.7 fL (ref 80.0–100.0)
Platelets: 227 10*3/uL (ref 150–400)
RBC: 3 MIL/uL — ABNORMAL LOW (ref 3.87–5.11)
RDW: 13.4 % (ref 11.5–15.5)
WBC: 7.9 10*3/uL (ref 4.0–10.5)
nRBC: 0 % (ref 0.0–0.2)

## 2020-01-08 LAB — BASIC METABOLIC PANEL
Anion gap: 10 (ref 5–15)
BUN: 21 mg/dL (ref 8–23)
CO2: 22 mmol/L (ref 22–32)
Calcium: 8.3 mg/dL — ABNORMAL LOW (ref 8.9–10.3)
Chloride: 109 mmol/L (ref 98–111)
Creatinine, Ser: 1.74 mg/dL — ABNORMAL HIGH (ref 0.44–1.00)
GFR, Estimated: 27 mL/min — ABNORMAL LOW (ref >60)
Glucose, Bld: 68 mg/dL — ABNORMAL LOW (ref 70–99)
Potassium: 4.1 mmol/L (ref 3.5–5.1)
Sodium: 141 mmol/L (ref 135–145)

## 2020-01-08 LAB — HEMOGLOBIN: Hemoglobin: 10.7 g/dL — ABNORMAL LOW (ref 12.0–15.0)

## 2020-01-08 NOTE — Plan of Care (Signed)
Continuing with plan of care. 

## 2020-01-08 NOTE — Progress Notes (Signed)
[name], female   DOB: [birth year], 79 y.o.   MRN: [id number] [facility] PROGRESS NOTE  Margaret Black RCV:[id number] DOB: 01/22/1941 DOA: 01/07/2020 PCP: Olin Hauser, DO  HPI/Subjective: Patient felt better this morning.  Only having slight left lower quadrant abdominal pain.  She states that she has had 6 episodes of bleeding from the rectal area prior to coming in.  Today had a small blood clot pass without her feeling it.  Ambulated around and felt okay.  Objective: Vitals:   01/08/20 0800 01/08/20 1210  BP: (!) 105/49 (!) 110/45  Pulse: 64 61  Resp: 14 16  Temp: 97.6 F (36.4 C) 97.9 F (36.6 C)  SpO2: 97% 100%    Intake/Output Summary (Last 24 hours) at 01/08/2020 1328 Last data filed at 01/08/2020 1300 Gross per 24 hour  Intake 2394.91 ml  Output 1000 ml  Net 1394.91 ml   Filed Weights   01/06/20 2307 01/07/20 1954  Weight: 56.7 kg 57.3 kg    ROS: Review of Systems  Respiratory: Negative for shortness of breath.   Cardiovascular: Negative for chest pain.  Gastrointestinal: Positive for abdominal pain and blood in stool. Negative for nausea and vomiting.   Exam: Physical Exam HENT:     Head: Normocephalic.     Mouth/Throat:     Pharynx: No oropharyngeal exudate.  Eyes:     General: Lids are normal.     Conjunctiva/sclera: Conjunctivae normal.     Pupils: Pupils are equal, round, and reactive to light.  Cardiovascular:     Rate and Rhythm: Normal rate and regular rhythm.     Heart sounds: Normal heart sounds, S1 normal and S2 normal.  Pulmonary:     Breath sounds: No decreased breath sounds, wheezing, rhonchi or rales.  Abdominal:     Palpations: Abdomen is soft.     Tenderness: There is abdominal tenderness in the left lower quadrant.  Musculoskeletal:     Right lower leg: No swelling.     Left lower leg: No swelling.  Skin:    General: Skin is warm.     Findings: No rash.  Neurological:     Mental Status: She is  alert and oriented to person, place, and time.       Data Reviewed: Basic Metabolic Panel: Recent Labs  Lab 01/06/20 2312 01/08/20 0512  NA 125* 141  K 4.4 4.1  CL 91* 109  CO2 20* 22  GLUCOSE 123* 68*  BUN 33* 21  CREATININE 1.81* 1.74*  CALCIUM 9.2 8.3*   Liver Function Tests: Recent Labs  Lab 01/06/20 2312  AST 25  ALT 19  ALKPHOS 73  BILITOT 1.4*  PROT 7.2  ALBUMIN 3.8   Recent Labs  Lab 01/06/20 2312  LIPASE 29   CBC: Recent Labs  Lab 01/06/20 2312 01/08/20 0512  WBC 9.4 7.9  HGB 11.7* 9.3*  HCT 32.5* 28.7*  MCV 90.0 95.7  PLT 295 227     Recent Results (from the past 240 hour(s))  Respiratory Panel by RT PCR (Flu A&B, Covid) - Nasopharyngeal Swab     Status: None   Collection Time: 01/07/20  5:05 AM   Specimen: Nasopharyngeal Swab  Result Value Ref Range Status   SARS Coronavirus 2 by RT PCR NEGATIVE NEGATIVE Final    Comment: (NOTE) SARS-CoV-2 target nucleic acids are NOT DETECTED.  The SARS-CoV-2 RNA is generally detectable in upper respiratoy specimens during the acute phase of infection. The lowest concentration  of SARS-CoV-2 viral copies this assay can detect is 131 copies/mL. A negative result does not preclude SARS-Cov-2 infection and should not be used as the sole basis for treatment or other patient management decisions. A negative result may occur with  improper specimen collection/handling, submission of specimen other than nasopharyngeal swab, presence of viral mutation(s) within the areas targeted by this assay, and inadequate number of viral copies (<131 copies/mL). A negative result must be combined with clinical observations, patient history, and epidemiological information. The expected result is Negative.  Fact Sheet for Patients:  PinkCheek.be  Fact Sheet for Healthcare Providers:  GravelBags.it  This test is no t yet approved or cleared by the [place]  FDA and  has been authorized for detection and/or diagnosis of SARS-CoV-2 by FDA under an Emergency Use Authorization (EUA). This EUA will remain  in effect (meaning this test can be used) for the duration of the COVID-19 declaration under Section 564(b)(1) of the Act, 21 U.S.C. section 360bbb-3(b)(1), unless the authorization is terminated or revoked sooner.     Influenza A by PCR NEGATIVE NEGATIVE Final   Influenza B by PCR NEGATIVE NEGATIVE Final    Comment: (NOTE) The Xpert Xpress SARS-CoV-2/FLU/RSV assay is intended as an aid in  the diagnosis of influenza from Nasopharyngeal swab specimens and  should not be used as a sole basis for treatment. Nasal washings and  aspirates are unacceptable for Xpert Xpress SARS-CoV-2/FLU/RSV  testing.  Fact Sheet for Patients: PinkCheek.be  Fact Sheet for Healthcare Providers: GravelBags.it  This test is not yet approved or cleared by the [place] FDA and  has been authorized for detection and/or diagnosis of SARS-CoV-2 by  FDA under an Emergency Use Authorization (EUA). This EUA will remain  in effect (meaning this test can be used) for the duration of the  Covid-19 declaration under Section 564(b)(1) of the Act, 21  U.S.C. section 360bbb-3(b)(1), unless the authorization is  terminated or revoked. Performed at [facility], [address] [place], [place] [address]      Studies: CT ABDOMEN PELVIS WO CONTRAST  Result Date: 01/07/2020 CLINICAL DATA:  Abdominal pain EXAM: CT ABDOMEN AND PELVIS WITHOUT CONTRAST TECHNIQUE: Multidetector CT imaging of the abdomen and pelvis was performed following the standard protocol without IV contrast. COMPARISON:  12/30/2008 FINDINGS: LOWER CHEST: There is left basilar atelectasis. Unchanged 12 mm nodule at the left lung base (series 3, image 12). HEPATOBILIARY: Normal hepatic contours. No intra- or extrahepatic biliary dilatation.  Status post cholecystectomy. PANCREAS: Normal pancreas. No ductal dilatation or peripancreatic fluid collection. SPLEEN: Normal. ADRENALS/URINARY TRACT: The adrenal glands are normal. No hydronephrosis, nephroureterolithiasis or solid renal mass. The urinary bladder is normal for degree of distention STOMACH/BOWEL: There is no hiatal hernia. Normal duodenal course and caliber. No small bowel dilatation or inflammation. Sigmoid diverticulosis with mild surrounding inflammatory stranding. No free intraperitoneal air. Normal appendix. VASCULAR/LYMPHATIC: Normal course and caliber of the major abdominal vessels. No abdominal or pelvic lymphadenopathy. REPRODUCTIVE: Status post hysterectomy MUSCULOSKELETAL. No bony spinal canal stenosis or focal osseous abnormality. OTHER: None. IMPRESSION: 1. Mild acute sigmoid diverticulitis without abscess or free intraperitoneal air. 2. Unchanged 12 mm left lower lobe pulmonary nodule. Electronically Signed   By: Ulyses Jarred M.D.   On: 01/07/2020 05:23    Scheduled Meds:  vitamin C  500 mg Oral Daily   calcium-vitamin D  1 tablet Oral Daily   levothyroxine  88 mcg Oral Q0600   LORazepam  0.5 mg Oral QHS  multivitamin with minerals  1 tablet Oral Daily   nebivolol  5 mg Oral Daily   NIFEdipine  60 mg Oral QPM   pantoprazole (PROTONIX) IV  40 mg Intravenous Q24H   Continuous Infusions:  sodium chloride Stopped (01/08/20 1108)   piperacillin-tazobactam (ZOSYN)  IV 12.5 mL/hr at 01/08/20 1300    Assessment/Plan:  1. Acute diverticulitis on IV Zosyn.  Patient still having left lower quadrant pain. 2. Diverticular bleeding.  Had 6 episodes of bleeding prior to coming in and small clot passed today.  Continue to monitor hemoglobin now and again in the morning.  Decrease rate of IV fluids.  Hold aspirin.  Hemoglobin came down from 11.7 down to 9.3. 3. Hyponatremia on presentation with a sodium 125.  With IV fluid hydration it normalized to 141. 4. Acute  kidney injury on chronic kidney disease stage IIIa.  Continue gentle IV fluid hydration.  Hold enalapril. 5. Essential hypertension blood pressure on the low side on low-dose bisoprolol and nifedipine at night.  Holding parameters on nifedipine. 6. GERD on PPI 7. History of lung cancer follow-up with radiation oncology as outpatient 8. Anxiety on Xanax    Code Status:     Code Status Orders  (From admission, onward)         Start     Ordered   01/07/20 0830  Do not attempt resuscitation (DNR)  Continuous       Question Answer Comment  In the event of cardiac or respiratory ARREST Do not call a code blue   In the event of cardiac or respiratory ARREST Do not perform Intubation, CPR, defibrillation or ACLS   In the event of cardiac or respiratory ARREST Use medication by any route, position, wound care, and other measures to relive pain and suffering. May use oxygen, suction and manual treatment of airway obstruction as needed for comfort.   Comments CODE STATUS was discussed with patient who wishes to be placed on a DO NOT RESUSCITATE status      01/07/20 0832        Code Status History    This patient has a current code status but no historical code status.   Advance Care Planning Activity    Advance Directive Documentation     Most Recent Value  Type of Advance Directive Living will  Pre-existing out of facility DNR order (yellow form or pink MOST form) --  "MOST" Form in Place? --     Family Communication: Spoke with husband on the phone Disposition Plan: Status is: Inpatient  Dispo: The patient is from: Home              Anticipated d/c is to: Home              Anticipated d/c date is: Potential 01/09/2020 versus 01/10/2020 depending on whether she stops bleeding or not.              Patient currently being treated for acute diverticulitis with diverticular bleeding.  Antibiotics:  Zosyn  Time spent: 28 minutes  [place]

## 2020-01-09 DIAGNOSIS — R1030 Lower abdominal pain, unspecified: Secondary | ICD-10-CM | POA: Diagnosis not present

## 2020-01-09 DIAGNOSIS — C3490 Malignant neoplasm of unspecified part of unspecified bronchus or lung: Secondary | ICD-10-CM

## 2020-01-09 DIAGNOSIS — K5733 Diverticulitis of large intestine without perforation or abscess with bleeding: Secondary | ICD-10-CM | POA: Diagnosis not present

## 2020-01-09 DIAGNOSIS — E871 Hypo-osmolality and hyponatremia: Secondary | ICD-10-CM | POA: Diagnosis not present

## 2020-01-09 DIAGNOSIS — N179 Acute kidney failure, unspecified: Secondary | ICD-10-CM | POA: Diagnosis not present

## 2020-01-09 DIAGNOSIS — K219 Gastro-esophageal reflux disease without esophagitis: Secondary | ICD-10-CM

## 2020-01-09 LAB — BASIC METABOLIC PANEL
Anion gap: 9 (ref 5–15)
BUN: 17 mg/dL (ref 8–23)
CO2: 23 mmol/L (ref 22–32)
Calcium: 8.5 mg/dL — ABNORMAL LOW (ref 8.9–10.3)
Chloride: 107 mmol/L (ref 98–111)
Creatinine, Ser: 1.59 mg/dL — ABNORMAL HIGH (ref 0.44–1.00)
GFR, Estimated: 31 mL/min — ABNORMAL LOW (ref >60)
Glucose, Bld: 90 mg/dL (ref 70–99)
Potassium: 3.7 mmol/L (ref 3.5–5.1)
Sodium: 139 mmol/L (ref 135–145)

## 2020-01-09 LAB — CBC
HCT: 28.9 % — ABNORMAL LOW (ref 36.0–46.0)
Hemoglobin: 10 g/dL — ABNORMAL LOW (ref 12.0–15.0)
MCH: 32.9 pg (ref 26.0–34.0)
MCHC: 34.6 g/dL (ref 30.0–36.0)
MCV: 95.1 fL (ref 80.0–100.0)
Platelets: 238 10*3/uL (ref 150–400)
RBC: 3.04 MIL/uL — ABNORMAL LOW (ref 3.87–5.11)
RDW: 13.3 % (ref 11.5–15.5)
WBC: 6.7 10*3/uL (ref 4.0–10.5)
nRBC: 0 % (ref 0.0–0.2)

## 2020-01-09 MED ORDER — PANTOPRAZOLE SODIUM 40 MG PO TBEC
40.0000 mg | DELAYED_RELEASE_TABLET | Freq: Every day | ORAL | 0 refills | Status: DC
Start: 2020-01-10 — End: 2020-01-27

## 2020-01-09 MED ORDER — HYDROCORTISONE (PERIANAL) 2.5 % EX CREA: TOPICAL_CREAM | Freq: Three times a day (TID) | CUTANEOUS | 0 refills | Status: DC

## 2020-01-09 MED ORDER — HYDROCORTISONE (PERIANAL) 2.5 % EX CREA
TOPICAL_CREAM | Freq: Three times a day (TID) | CUTANEOUS | Status: DC
  Filled 2020-01-09: qty 28.35

## 2020-01-09 MED ORDER — SODIUM CHLORIDE 0.9 % IV SOLN
INTRAVENOUS | Status: DC | PRN
  Administered 2020-01-09: 250 mL via INTRAVENOUS

## 2020-01-09 MED ORDER — PANTOPRAZOLE SODIUM 40 MG PO TBEC
40.0000 mg | DELAYED_RELEASE_TABLET | Freq: Every day | ORAL | Status: DC
  Administered 2020-01-09: 40 mg via ORAL
  Filled 2020-01-09: qty 1

## 2020-01-09 MED ORDER — RISAQUAD PO CAPS: ORAL_CAPSULE | ORAL | 0 refills | Status: AC

## 2020-01-09 MED ORDER — AMOXICILLIN-POT CLAVULANATE 875-125 MG PO TABS: 1.0000 | ORAL_TABLET | Freq: Two times a day (BID) | ORAL | 0 refills | Status: DC

## 2020-01-09 MED ORDER — AMOXICILLIN-POT CLAVULANATE 875-125 MG PO TABS
1.0000 | ORAL_TABLET | Freq: Two times a day (BID) | ORAL | Status: DC
  Administered 2020-01-09: 1 via ORAL
  Filled 2020-01-09: qty 1

## 2020-01-09 NOTE — Discharge Summary (Signed)
[facility] [place] NAME: Margaret Black    MR#:  [id number]  DATE OF BIRTH:  1940-04-02  DATE OF ADMISSION:  01/07/2020 ADMITTING PHYSICIAN: Athena Masse, MD  DATE OF DISCHARGE: 01/09/2020 12:40 PM  PRIMARY CARE PHYSICIAN: Olin Hauser, DO    ADMISSION DIAGNOSIS:  Hyponatremia [E87.1] Sigmoid diverticulitis [K57.32] Lower abdominal pain [R10.30]  DISCHARGE DIAGNOSIS:  Principal Problem:   Diverticulitis of colon with bleeding Active Problems:   Postsurgical hypothyroidism   HYPERALDOSTERONISM UNSPECIFIED   Benign hypertension with CKD (chronic kidney disease) stage III (HCC)   GERD   Hx of cancer of lung   Centrilobular emphysema (HCC)   Lung cancer (HCC)   Hyponatremia   Dehydration   Acute diverticulitis   Acute kidney injury superimposed on CKD ([place])   Essential hypertension   SECONDARY DIAGNOSIS:   Past Medical History:  Diagnosis Date  . Anxiety    Related to HTN and vision loss  . Central retinal vein occlusion of left eye    legally blind in Left eye  . 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 Collagen vascular disease ([place])    uncharacterized.  has seen rheum.  positive RF, positive serologies for Sjogrens.  C-ANCA/P-ANCA neg, ANA  neg, anti-dsDNA neg, anti-SCL70 neg, anti-centromere Ab neg  . COPD (chronic obstructive pulmonary disease) ([place])    quit smoking 1993  . Diverticulosis   . Dysfunction of eustachian tube   . Fatigue   . GERD (gastroesophageal reflux disease)   . Goiter, unspecified   . HLD (hyperlipidemia)   . HTN (hypertension)    difficult to control, hypertensive urgency hospitalizations, normal urine/plasma metanephrines and catecholamines, renal dopplers without stenosis  . Hyperaldosteronism    serum aldo/renin ration elevated (aldo 23.7, renin <0.15), CT abd 2010 with normal adrenals, dedicated adrenal CT without adrenal adenoma,   .  Hypothyroid   . Insomnia    Related to HTN and vision loss  . Irritable bowel syndrome   . Leiomyosarcoma of uterus ([place]) 09/22/2014  . Lung cancer [facility])    s/p R lobectomy 2001  . Lupus ([place])   . Osteopenia   . Pulmonary hypertension ([place])    echo [facility] 12/2008 with EF >5%, grade II diastolic dysfunction, RSVP >35mmHg, ACE level normal.  f/u echo at [place] - EF 55-60%, mild MR, mild LAE, PASP 25 + RA (no pulm HTN)  . Thyroid cancer ([place]) 2014  . Urge incontinence   . Uterine cancer [facility])    s/p hysterectomy 2001    HOSPITAL COURSE:   1.  Acute diverticulitis.  Patient was given IV Zosyn during the hospital course and will be switched over to Augmentin upon going home.  Patient still has a slight bit of abdominal pain only when palpating.  She wanted to go home. 2.  Diverticular bleeding.  She did have quite a few episodes of bleeding prior to coming in.  She did pass a small clot this morning but still wanted to go home.  Hemoglobin actually up from yesterday at 10.0.  Patient does have hemorrhoids which I did give a hemorrhoid cream for.  If the patient continues to have bleeding after diverticulitis treated then consider colonoscopy.  Hold aspirin for 2 weeks. 3.  Hyponatremia on presentation.  With IV fluids this normalized to 141. 4.  Acute kidney injury on chronic kidney disease stage IIIa.  Creatinine improved to 1.59.  Likely will continue to improve as outpatient.  Hold enalapril. 5.  Essential hypertension.  Blood pressure on the lower side will use only the low that is bisoprolol and nifedipine at night.  Hold spironolactone and enalapril. 6.  GERD on PPI 7.  History of lung cancer.  Follow-up with radiation oncology as outpatient 8.  Anxiety on Xanax  DISCHARGE CONDITIONS:  Satisfactory  CONSULTS OBTAINED:  None  DRUG ALLERGIES:   Allergies  Allergen Reactions  . Lactase Diarrhea  . Atenolol Rash  . Latex Rash    DISCHARGE MEDICATIONS:   Allergies as of  01/09/2020      Reactions   Lactase Diarrhea   Atenolol Rash   Latex Rash      Medication List    STOP taking these medications   aspirin 81 MG tablet   enalapril 2.5 MG tablet Commonly known as: VASOTEC   spironolactone 25 MG tablet Commonly known as: ALDACTONE     TAKE these medications   acidophilus Caps capsule 2 capsules daily (okay to substitute any probiotic)   albuterol 108 (90 Base) MCG/ACT inhaler Commonly known as: VENTOLIN HFA Inhale 2 puffs into the lungs every 4 (four) hours as needed for wheezing or shortness of breath (cough).   amoxicillin-clavulanate 875-125 MG tablet Commonly known as: AUGMENTIN Take 1 tablet by mouth every 12 (twelve) hours for 7 days.   Bystolic 5 MG tablet Generic drug: nebivolol Take 1 tablet (5 mg total) by mouth daily.   CALCIUM-VITAMIN D PO Take 1 tablet by mouth daily.   hydrocortisone 2.5 % rectal cream Commonly known as: ANUSOL-HC Place rectally 3 (three) times daily.   LORazepam 0.5 MG tablet Commonly known as: ATIVAN Take 1 tablet (0.5 mg total) by mouth at bedtime.   multivitamin tablet Take 1 tablet by mouth daily.   NIFEdipine 60 MG 24 hr tablet Commonly known as: PROCARDIA XL/NIFEDICAL XL TAKE 1 TABLET EVERY EVENING What changed: additional instructions   ondansetron 4 MG disintegrating tablet Commonly known as: Zofran ODT Take 1-2 tablets (4-8 mg total) by mouth every 8 (eight) hours as needed for nausea or vomiting.   pantoprazole 40 MG tablet Commonly known as: PROTONIX Take 1 tablet (40 mg total) by mouth daily. Start taking on: January 10, 2020   Synthroid 88 MCG tablet Generic drug: levothyroxine Take 1 tablet (88 mcg total) by mouth daily before breakfast.   vitamin C 500 MG tablet Commonly known as: ASCORBIC ACID Take 500 mg by mouth daily.        DISCHARGE INSTRUCTIONS:   Follow-up PMD 5 days  If you experience worsening of your admission symptoms, develop shortness of breath,  life threatening emergency, suicidal or homicidal thoughts you must seek medical attention immediately by calling 911 or calling your MD immediately  if symptoms less severe.  You Must read complete instructions/literature along with all the possible adverse reactions/side effects for all the Medicines you take and that have been prescribed to you. Take any new Medicines after you have completely understood and accept all the possible adverse reactions/side effects.   Please note  You were cared for by a hospitalist during your hospital stay. If you have any questions about your discharge medications or the care you received while you were in the hospital after you are discharged, you can call the unit and asked to speak with the hospitalist on call if the hospitalist that took care of you is not available. Once you are discharged, your primary care physician  will handle any further medical issues. Please note that NO REFILLS for any discharge medications will be authorized once you are discharged, as it is imperative that you return to your primary care physician (or establish a relationship with a primary care physician if you do not have one) for your aftercare needs so that they can reassess your need for medications and monitor your lab values.    Today   CHIEF COMPLAINT:   Chief Complaint  Patient presents with  . Abdominal Pain    HISTORY OF PRESENT ILLNESS:  Margaret Black  is a 79 y.o. female came in with abdominal pain and found to have acute sigmoid diverticulitis   VITAL SIGNS:  Blood pressure (!) 125/50, pulse 61, temperature 97.8 F (36.6 C), resp. rate 16, height 5\' 5"  (1.651 m), weight 57.3 kg, SpO2 100 %.  I/O:    Intake/Output Summary (Last 24 hours) at 01/09/2020 1510 Last data filed at 01/09/2020 0900 Gross per 24 hour  Intake 977.72 ml  Output 0 ml  Net 977.72 ml    PHYSICAL EXAMINATION:  GENERAL:  79 y.o.-year-old patient lying in the bed with no acute  distress.  EYES: Pupils equal, round, reactive to light and accommodation. No scleral icterus. Extraocular muscles intact.  HEENT: Head atraumatic, normocephalic. Oropharynx and nasopharynx clear.  LUNGS: Normal breath sounds bilaterally, no wheezing, rales,rhonchi or crepitation. No use of accessory muscles of respiration.  CARDIOVASCULAR: S1, S2 normal. No murmurs, rubs, or gallops.  ABDOMEN: Soft, left lower quadrant tenderness only to palpation. EXTREMITIES: No pedal edema.  NEUROLOGIC: Cranial nerves II through XII are intact. Gait not checked.  PSYCHIATRIC: The patient is alert and oriented x 3.  SKIN: No obvious rash, lesion, or ulcer.   DATA REVIEW:   CBC Recent Labs  Lab 01/09/20 0511  WBC 6.7  HGB 10.0*  HCT 28.9*  PLT 238    Chemistries  Recent Labs  Lab 01/06/20 2312 01/08/20 0512 01/09/20 0511  NA 125*   < > 139  K 4.4   < > 3.7  CL 91*   < > 107  CO2 20*   < > 23  GLUCOSE 123*   < > 90  BUN 33*   < > 17  CREATININE 1.81*   < > 1.59*  CALCIUM 9.2   < > 8.5*  AST 25  --   --   ALT 19  --   --   ALKPHOS 73  --   --   BILITOT 1.4*  --   --    < > = values in this interval not displayed.    Microbiology Results  Results for orders placed or performed during the hospital encounter of 01/07/20  Respiratory Panel by RT PCR (Flu A&B, Covid) - Nasopharyngeal Swab     Status: None   Collection Time: 01/07/20  5:05 AM   Specimen: Nasopharyngeal Swab  Result Value Ref Range Status   SARS Coronavirus 2 by RT PCR NEGATIVE NEGATIVE Final    Comment: (NOTE) SARS-CoV-2 target nucleic acids are NOT DETECTED.  The SARS-CoV-2 RNA is generally detectable in upper respiratoy specimens during the acute phase of infection. The lowest concentration of SARS-CoV-2 viral copies this assay can detect is 131 copies/mL. A negative result does not preclude SARS-Cov-2 infection and should not be used as the sole basis for treatment or other patient management decisions. A  negative result may occur with  improper specimen collection/handling, submission of specimen other than nasopharyngeal swab, presence  of viral mutation(s) within the areas targeted by this assay, and inadequate number of viral copies (<131 copies/mL). A negative result must be combined with clinical observations, patient history, and epidemiological information. The expected result is Negative.  Fact Sheet for Patients:  PinkCheek.be  Fact Sheet for Healthcare Providers:  GravelBags.it  This test is no t yet approved or cleared by the [place] FDA and  has been authorized for detection and/or diagnosis of SARS-CoV-2 by FDA under an Emergency Use Authorization (EUA). This EUA will remain  in effect (meaning this test can be used) for the duration of the COVID-19 declaration under Section 564(b)(1) of the Act, 21 U.S.C. section 360bbb-3(b)(1), unless the authorization is terminated or revoked sooner.     Influenza A by PCR NEGATIVE NEGATIVE Final   Influenza B by PCR NEGATIVE NEGATIVE Final    Comment: (NOTE) The Xpert Xpress SARS-CoV-2/FLU/RSV assay is intended as an aid in  the diagnosis of influenza from Nasopharyngeal swab specimens and  should not be used as a sole basis for treatment. Nasal washings and  aspirates are unacceptable for Xpert Xpress SARS-CoV-2/FLU/RSV  testing.  Fact Sheet for Patients: PinkCheek.be  Fact Sheet for Healthcare Providers: GravelBags.it  This test is not yet approved or cleared by the [place] FDA and  has been authorized for detection and/or diagnosis of SARS-CoV-2 by  FDA under an Emergency Use Authorization (EUA). This EUA will remain  in effect (meaning this test can be used) for the duration of the  Covid-19 declaration under Section 564(b)(1) of the Act, 21  U.S.C. section 360bbb-3(b)(1), unless the authorization  is  terminated or revoked. Performed at [facility], [address], [place], [place] [address]      Management plans discussed with the patient, and she is in agreement.  CODE STATUS:     Code Status Orders  (From admission, onward)         Start     Ordered   01/07/20 0830  Do not attempt resuscitation (DNR)  Continuous       Question Answer Comment  In the event of cardiac or respiratory ARREST Do not call a "code blue"   In the event of cardiac or respiratory ARREST Do not perform Intubation, CPR, defibrillation or ACLS   In the event of cardiac or respiratory ARREST Use medication by any route, position, wound care, and other measures to relive pain and suffering. May use oxygen, suction and manual treatment of airway obstruction as needed for comfort.   Comments CODE STATUS was discussed with patient who wishes to be placed on a DO NOT RESUSCITATE status      01/07/20 0832        Code Status History    This patient has a current code status but no historical code status.   Advance Care Planning Activity    Advance Directive Documentation     Most Recent Value  Type of Advance Directive Living will  Pre-existing out of facility DNR order (yellow form or pink MOST form) --  "MOST" Form in Place? --      TOTAL TIME TAKING CARE OF THIS PATIENT: 35 minutes.    Loletha Grayer M.D on 01/09/2020 at 3:10 PM  Between 7am to 6pm - Pager - [phone]  After 6pm go to [email or web address] - password [email or web address]  [facility]  CC: Primary care physician; Olin Hauser, DO

## 2020-01-09 NOTE — Progress Notes (Signed)
[place] [place] A and O x4. VSS. Pt tolerating diet well. No complaints of nausea or vomiting. IV removed intact, prescriptions given. Pt voices understanding of discharge instructions with no further questions. Patient discharged via wheelchair with volunteer.  Allergies as of 01/09/2020      Reactions   Lactase Diarrhea   Atenolol Rash   Latex Rash      Medication List    STOP taking these medications   aspirin 81 MG tablet   enalapril 2.5 MG tablet Commonly known as: VASOTEC   spironolactone 25 MG tablet Commonly known as: ALDACTONE     TAKE these medications   acidophilus Caps capsule 2 capsules daily (okay to substitute any probiotic)   albuterol 108 (90 Base) MCG/ACT inhaler Commonly known as: VENTOLIN HFA Inhale 2 puffs into the lungs every 4 (four) hours as needed for wheezing or shortness of breath (cough).   amoxicillin-clavulanate 875-125 MG tablet Commonly known as: AUGMENTIN Take 1 tablet by mouth every 12 (twelve) hours for 7 days.   Bystolic 5 MG tablet Generic drug: nebivolol Take 1 tablet (5 mg total) by mouth daily.   CALCIUM-VITAMIN D PO Take 1 tablet by mouth daily.   hydrocortisone 2.5 % rectal cream Commonly known as: ANUSOL-HC Place rectally 3 (three) times daily.   LORazepam 0.5 MG tablet Commonly known as: ATIVAN Take 1 tablet (0.5 mg total) by mouth at bedtime.   multivitamin tablet Take 1 tablet by mouth daily.   NIFEdipine 60 MG 24 hr tablet Commonly known as: PROCARDIA XL/NIFEDICAL XL TAKE 1 TABLET EVERY EVENING What changed: additional instructions   ondansetron 4 MG disintegrating tablet Commonly known as: Zofran ODT Take 1-2 tablets (4-8 mg total) by mouth every 8 (eight) hours as needed for nausea or vomiting.   pantoprazole 40 MG tablet Commonly known as: PROTONIX Take 1 tablet (40 mg total) by mouth daily. Start taking on: January 10, 2020   Synthroid 88 MCG tablet Generic drug: levothyroxine Take 1 tablet (88 mcg  total) by mouth daily before breakfast.   vitamin C 500 MG tablet Commonly known as: ASCORBIC ACID Take 500 mg by mouth daily.       Vitals:   01/09/20 0829 01/09/20 1030  BP: (!) 123/52 (!) 125/50  Pulse: 62 61  Resp: 16   Temp: 97.8 F (36.6 C)   SpO2: 100%     Tayvon Culley C Numan Zylstra

## 2020-01-09 NOTE — Discharge Instructions (Signed)

## 2020-01-09 NOTE — Telephone Encounter (Signed)
Yes I saw that, she was admitted later on Friday. She can schedule hospital follow-up whenever ready. No need to call now.  [name], DO [facility] 01/09/2020, 8:55 AM

## 2020-01-09 NOTE — Telephone Encounter (Signed)
Patient is still in the hospital diagnosed with sigmoid diverticulitis.

## 2020-01-10 ENCOUNTER — Telehealth: Payer: Self-pay

## 2020-01-10 NOTE — Telephone Encounter (Signed)
Transition Care Management Unsuccessful Follow-up Telephone Call  Date of discharge and from where:  01/09/2020 [facility]  Attempts:  1st Attempt  Reason for unsuccessful TCM follow-up call:  Left voice message

## 2020-01-10 NOTE — Telephone Encounter (Signed)
   This nurse called patient for the second time in order to perform TCM visit. She was not interested in the call. States that she wants to get stronger before she sees Dr. Raliegh Ip

## 2020-01-11 DIAGNOSIS — Z510 Encounter for antineoplastic radiation therapy: Secondary | ICD-10-CM | POA: Diagnosis present

## 2020-01-11 DIAGNOSIS — C3412 Malignant neoplasm of upper lobe, left bronchus or lung: Secondary | ICD-10-CM | POA: Diagnosis not present

## 2020-01-13 ENCOUNTER — Telehealth: Payer: Self-pay | Admitting: Family Medicine

## 2020-01-13 ENCOUNTER — Telehealth: Payer: Self-pay | Admitting: *Deleted

## 2020-01-13 ENCOUNTER — Telehealth: Payer: Self-pay | Admitting: Nurse Practitioner

## 2020-01-13 DIAGNOSIS — T7840XA Allergy, unspecified, initial encounter: Secondary | ICD-10-CM

## 2020-01-13 MED ORDER — PREDNISONE 10 MG PO TABS: ORAL_TABLET | ORAL | 0 refills | Status: DC

## 2020-01-13 NOTE — Telephone Encounter (Signed)
Patient called reporting that she thinks she is having an allergic reaction, her tongue is very red and swollen and "the back of my throat doesn't feel good either" She reports that she has taken a benadryl and has called her PCP and is waiting to hear back form him. I spoke with Alease Medina, Np and she will call patient. Patient also states that she does not think she should start radiation therapy Monday.

## 2020-01-13 NOTE — Telephone Encounter (Signed)
Patient informed as per Dr Raliegh Ip.

## 2020-01-13 NOTE — Telephone Encounter (Signed)
I spoke to patient. Please see note. No need to hold radiation at this time.

## 2020-01-13 NOTE — Telephone Encounter (Signed)
Received call from patient for possible allergic reaction. Complains of red tongue and redness in back of her throat. No shortness of breath, cough, hives. She is uncertain of anything new that she has taken. Has been taking augmentin for diverticulitis for 4 days. Feels well otherwise. She has taken benadryl 25 mg for possible allergic reaction. Patient sounds well on phone, speaking in strong voice without cough, throat clearing. Discussed ER vs UC vs home monitoring. Discussed taking famotidine if symptoms persisted or she developed symptoms more concerning for allergic reaction. Precautions provided. No need to change radiation treatment appointments at this time.

## 2020-01-13 NOTE — Telephone Encounter (Signed)
See triage note. Patient called with allergic reaction tongue, lip, throat swelling after recently starting Augmentin antibiotic.  She has already called her [facility] Oncology team and received advice from them.  She declines ED, Urgent Care due to not having any difficulty with breathing right now.  I have advised her on Benadryl dosing 25-50mg  q 8 hr PRN.  I will send rx Prednisone short burst therapy with taper if she needs but otherwise advised seek care if significant worsening. She does not plan to use prednisone right now only if needed  [name], [facility] 01/13/2020, 3:09 PM

## 2020-01-16 ENCOUNTER — Ambulatory Visit
Admission: RE | Admit: 2020-01-16 | Discharge: 2020-01-16 | Disposition: A | Discharge status: Home / Self Care | Payer: Medicare Other | Source: Ambulatory Visit | Attending: Radiation Oncology | Admitting: Radiation Oncology

## 2020-01-16 DIAGNOSIS — Z510 Encounter for antineoplastic radiation therapy: Secondary | ICD-10-CM | POA: Diagnosis not present

## 2020-01-17 ENCOUNTER — Other Ambulatory Visit: Payer: Self-pay

## 2020-01-17 ENCOUNTER — Telehealth (INDEPENDENT_AMBULATORY_CARE_PROVIDER_SITE_OTHER): Payer: Medicare Other | Admitting: Family Medicine

## 2020-01-17 ENCOUNTER — Encounter: Payer: Self-pay | Admitting: Family Medicine

## 2020-01-17 DIAGNOSIS — K5792 Diverticulitis of intestine, part unspecified, without perforation or abscess without bleeding: Secondary | ICD-10-CM

## 2020-01-17 DIAGNOSIS — N189 Chronic kidney disease, unspecified: Secondary | ICD-10-CM | POA: Diagnosis not present

## 2020-01-17 DIAGNOSIS — N179 Acute kidney failure, unspecified: Secondary | ICD-10-CM | POA: Diagnosis not present

## 2020-01-17 MED ORDER — METRONIDAZOLE 500 MG PO TABS: 500.0000 mg | ORAL_TABLET | Freq: Two times a day (BID) | ORAL | 0 refills | Status: DC

## 2020-01-17 MED ORDER — CIPROFLOXACIN HCL 500 MG PO TABS: 500.0000 mg | ORAL_TABLET | Freq: Two times a day (BID) | ORAL | 0 refills | Status: DC

## 2020-01-17 NOTE — Progress Notes (Signed)
Virtual Visit via Telephone The purpose of this virtual visit is to provide medical care while limiting exposure to the novel coronavirus (COVID19) for both patient and office staff.  Consent was obtained for phone visit:  Yes.   Answered questions that patient had about telehealth interaction:  Yes.   I discussed the limitations, risks, security and privacy concerns of performing an evaluation and management service by telephone. I also discussed with the patient that there may be a patient responsible charge related to this service. The patient expressed understanding and agreed to proceed.  Patient Location: Home Provider Location: [facility])  ---------------------------------------------------------------------- Chief Complaint  Patient presents with  . Hospitalization Follow-up    lower abdominal pain and cramps --intense today --Aspirin taken off but wants to find out if she needs to be on --during allergic reaction she stopped taking all the meds including Abx   . Diverticulitis     can she take antibiotics now since she is having diverticulitis like pain onset today    S: Reviewed CMA documentation. I have called patient and gathered additional HPI as follows:  HOSPITAL FOLLOW-UP VISIT  Hospital/Location: [place] Date of Admission: 01/07/20 Date of Discharge: 01/09/20 Transitions of care telephone call: Declined by patient.  Reason for Admission: Abdominal Pain, Rectal Bleeding, Diverculitis Primary (+Secondary) Diagnosis: Acute Diverticulitis  FOLLOW-UP  - Hospital H&P and Discharge Summary have been reviewed - Patient presents today about 8 days after recent hospitalization. Brief summary of recent course, patient had symptoms of lower abdominal pain with rectal bleeding, she had contacted our office prior for medical advice, and felt dehydration, hospitalized, treated with IV Zosyn antibiotic, hemoglobin was 10 prior to leaving, does have history of  hemorrhoids but thought to have diverticular bleeding, they held her aspirin for 2 weeks on discharge to avoid bleeding. Initial dehydration, given IV fluids improved sodium level. Kidney function was declined. And Creatinine improved again with IV fluids.  Interval update she contacted [place] on 10/22 with tongue was swollen, still had soreness on roof of mouth and tongue is still read. She has stopped Augmentin, and she is now On prednisone with few doses left with improvement.  - Today reports overall had improved but now gradually returning with lower abdominal pain and bowel urgency and gas bloating. Seems to be gradual onset again similar to prior. Yesterday she was doing better. She started her first radiation treatment and then has not done as well.   She stopped antibiotics, now she is not sure if it was the cause of her tongue sensitivity and tongue swelling. She is concerned that the diverticulitis has not resolved.  She has resumed Spironolactone and Enalapril and her regular BP medications now. They were held due to AKI  Asking about Aspirin long term if needed or not. She has history of possible TIA >20 years ago was on daily baby aspirin. She prefers to stop the aspirin.  Additional background, in 05/2019 she was treated by Cyndia Skeeters, FNP for Diverticulitis with Cipro and Metronidazole and it resolved her problem acutely at that time. She has seen multiple GI doctors in past for this issue and is not interested to return to GI  I have reviewed the discharge medication list, and have reconciled the current and discharge medications today.  Denies any fevers, chills, sweats, body ache, cough, shortness of breath, sinus pain or pressure, headache  Past Medical History:  Diagnosis Date  . Anxiety    Related to HTN and vision  loss  . Central retinal vein occlusion of left eye    legally blind in Left eye  . Chronic kidney disease    with allergic interstitial nephritis on biopsy  2012  . Collagen vascular disease ([place])    uncharacterized.  has seen rheum.  positive RF, positive serologies for Sjogrens.  C-ANCA/P-ANCA neg, ANA  neg, anti-dsDNA neg, anti-SCL70 neg, anti-centromere Ab neg  . COPD (chronic obstructive pulmonary disease) ([place])    quit smoking 1993  . Diverticulosis   . Dysfunction of eustachian tube   . Fatigue   . GERD (gastroesophageal reflux disease)   . Goiter, unspecified   . HLD (hyperlipidemia)   . HTN (hypertension)    difficult to control, hypertensive urgency hospitalizations, normal urine/plasma metanephrines and catecholamines, renal dopplers without stenosis  . Hyperaldosteronism    serum aldo/renin ration elevated (aldo 23.7, renin <0.15), CT abd 2010 with normal adrenals, dedicated adrenal CT without adrenal adenoma,   . Hypothyroid   . Insomnia    Related to HTN and vision loss  . Irritable bowel syndrome   . Leiomyosarcoma of uterus ([place]) 09/22/2014  . Lung cancer [facility])    s/p R lobectomy 2001  . Lupus ([place])   . Osteopenia   . Pulmonary hypertension ([place])    echo [facility] 12/2008 with EF >5%, grade II diastolic dysfunction, RSVP >36mmHg, ACE level normal.  f/u echo at [facility] - EF 55-60%, mild MR, mild LAE, PASP 25 + RA (no pulm HTN)  . Thyroid cancer ([place]) 2014  . Urge incontinence   . Uterine cancer [facility])    s/p hysterectomy 2001   Social History   Tobacco Use  . Smoking status: Former Smoker    Types: Cigarettes    Quit date: 07/04/1991    Years since quitting: 28.5  . Smokeless tobacco: Former Systems developer  . Tobacco comment: Quit in early 1980's.  Vaping Use  . Vaping Use: Never used  Substance Use Topics  . Alcohol use: No  . Drug use: No    Current Outpatient Medications:  .  BYSTOLIC 5 MG tablet, Take 1 tablet (5 mg total) by mouth daily., Disp: 90 tablet, Rfl: 3 .  LORazepam (ATIVAN) 0.5 MG tablet, Take 1 tablet (0.5 mg total) by mouth at bedtime., Disp: 30 tablet, Rfl: 5 .  NIFEdipine (PROCARDIA XL/NIFEDICAL XL) 60 MG  24 hr tablet, TAKE 1 TABLET EVERY EVENING (Patient taking differently: Take 60 mg by mouth every evening. TAKE 1 TABLET EVERY EVENING), Disp: 90 tablet, Rfl: 0 .  pantoprazole (PROTONIX) 40 MG tablet, Take 1 tablet (40 mg total) by mouth daily., Disp: 30 tablet, Rfl: 0 .  SYNTHROID 88 MCG tablet, Take 1 tablet (88 mcg total) by mouth daily before breakfast., Disp: 90 tablet, Rfl: 3 .  acidophilus (RISAQUAD) CAPS capsule, 2 capsules daily (okay to substitute any probiotic) (Patient not taking: Reported on 01/17/2020), Disp: 60 capsule, Rfl: 0 .  albuterol (VENTOLIN HFA) 108 (90 Base) MCG/ACT inhaler, Inhale 2 puffs into the lungs every 4 (four) hours as needed for wheezing or shortness of breath (cough). (Patient not taking: Reported on 01/17/2020), Disp: 8 g, Rfl: 2 .  CALCIUM-VITAMIN D PO, Take 1 tablet by mouth daily.  (Patient not taking: Reported on 01/17/2020), Disp: , Rfl:  .  ciprofloxacin (CIPRO) 500 MG tablet, Take 1 tablet (500 mg total) by mouth 2 (two) times daily., Disp: 14 tablet, Rfl: 0 .  hydrocortisone (ANUSOL-HC) 2.5 % rectal cream, Place rectally 3 (three) times daily. (  Patient not taking: Reported on 01/17/2020), Disp: 30 g, Rfl: 0 .  metroNIDAZOLE (FLAGYL) 500 MG tablet, Take 1 tablet (500 mg total) by mouth 2 (two) times daily., Disp: 14 tablet, Rfl: 0 .  Multiple Vitamin (MULTIVITAMIN) tablet, Take 1 tablet by mouth daily. (Patient not taking: Reported on 01/17/2020), Disp: , Rfl:  .  vitamin C (ASCORBIC ACID) 500 MG tablet, Take 500 mg by mouth daily. (Patient not taking: Reported on 01/17/2020), Disp: , Rfl:   Depression screen [facility] 2/9 08/23/2019 01/06/2019 07/09/2018  Decreased Interest 0 0 0  Down, Depressed, Hopeless 0 1 0  PHQ - 2 Score 0 1 0  Altered sleeping - - 0  Tired, decreased energy - - 0  Change in appetite - - 0  Feeling bad or failure about yourself  - - 0  Trouble concentrating - - 0  Moving slowly or fidgety/restless - - 0  Suicidal thoughts - - 0  PHQ-9  Score - - 0  Difficult doing work/chores - - Not difficult at all  Some recent data might be hidden    GAD 7 : Generalized Anxiety Score 07/09/2018 07/01/2016  Nervous, Anxious, on Edge 1 0  Control/stop worrying 0 1  Worry too much - different things 0 0  Trouble relaxing 0 0  Restless 0 0  Easily annoyed or irritable 0 1  Afraid - awful might happen 0 0  Total GAD 7 Score 1 2  Anxiety Difficulty - Not difficult at all    -------------------------------------------------------------------------- O: No physical exam performed due to remote telephone encounter.  Lab results reviewed.  Recent Results (from the past 2160 hour(s))  Glucose, capillary     Status: Abnormal   Collection Time: 12/06/19  9:08 AM  Result Value Ref Range   Glucose-Capillary 107 (H) 70 - 99 mg/dL    Comment: Glucose reference range applies only to samples taken after fasting for at least 8 hours.  Lipase, blood     Status: None   Collection Time: 01/06/20 11:12 PM  Result Value Ref Range   Lipase 29 11 - 51 U/L    Comment: Performed at [facility], [address] [place], [place] [address]  Comprehensive metabolic panel     Status: Abnormal   Collection Time: 01/06/20 11:12 PM  Result Value Ref Range   Sodium 125 (L) 135 - 145 mmol/L   Potassium 4.4 3.5 - 5.1 mmol/L   Chloride 91 (L) 98 - 111 mmol/L   CO2 20 (L) 22 - 32 mmol/L   Glucose, Bld 123 (H) 70 - 99 mg/dL    Comment: Glucose reference range applies only to samples taken after fasting for at least 8 hours.   BUN 33 (H) 8 - 23 mg/dL   Creatinine, Ser 1.81 (H) 0.44 - 1.00 mg/dL   Calcium 9.2 8.9 - 10.3 mg/dL   Total Protein 7.2 6.5 - 8.1 g/dL   Albumin 3.8 3.5 - 5.0 g/dL   AST 25 15 - 41 U/L   ALT 19 0 - 44 U/L   Alkaline Phosphatase 73 38 - 126 U/L   Total Bilirubin 1.4 (H) 0.3 - 1.2 mg/dL   GFR, Estimated 26 (L) >60 mL/min   Anion gap 14 5 - 15    Comment: Performed at [facility], [address], [place],  [place] [address]  CBC     Status: Abnormal   Collection Time: 01/06/20 11:12 PM  Result Value Ref Range   WBC 9.4  4.0 - 10.5 K/uL   RBC 3.61 (L) 3.87 - 5.11 MIL/uL   Hemoglobin 11.7 (L) 12.0 - 15.0 g/dL   HCT 32.5 (L) 36 - 46 %   MCV 90.0 80.0 - 100.0 fL   MCH 32.4 26.0 - 34.0 pg   MCHC 36.0 30.0 - 36.0 g/dL   RDW 12.5 11.5 - 15.5 %   Platelets 295 150 - 400 K/uL   nRBC 0.0 0.0 - 0.2 %    Comment: Performed at [facility], [address], [place], [place] [address]  Respiratory Panel by RT PCR (Flu A&B, Covid) - Nasopharyngeal Swab     Status: None   Collection Time: 01/07/20  5:05 AM   Specimen: Nasopharyngeal Swab  Result Value Ref Range   SARS Coronavirus 2 by RT PCR NEGATIVE NEGATIVE    Comment: (NOTE) SARS-CoV-2 target nucleic acids are NOT DETECTED.  The SARS-CoV-2 RNA is generally detectable in upper respiratoy specimens during the acute phase of infection. The lowest concentration of SARS-CoV-2 viral copies this assay can detect is 131 copies/mL. A negative result does not preclude SARS-Cov-2 infection and should not be used as the sole basis for treatment or other patient management decisions. A negative result may occur with  improper specimen collection/handling, submission of specimen other than nasopharyngeal swab, presence of viral mutation(s) within the areas targeted by this assay, and inadequate number of viral copies (<131 copies/mL). A negative result must be combined with clinical observations, patient history, and epidemiological information. The expected result is Negative.  Fact Sheet for Patients:  PinkCheek.be  Fact Sheet for Healthcare Providers:  GravelBags.it  This test is no t yet approved or cleared by the [place] FDA and  has been authorized for detection and/or diagnosis of SARS-CoV-2 by FDA under an Emergency Use Authorization (EUA). This EUA will remain  in effect (meaning  this test can be used) for the duration of the COVID-19 declaration under Section 564(b)(1) of the Act, 21 U.S.C. section 360bbb-3(b)(1), unless the authorization is terminated or revoked sooner.     Influenza A by PCR NEGATIVE NEGATIVE   Influenza B by PCR NEGATIVE NEGATIVE    Comment: (NOTE) The Xpert Xpress SARS-CoV-2/FLU/RSV assay is intended as an aid in  the diagnosis of influenza from Nasopharyngeal swab specimens and  should not be used as a sole basis for treatment. Nasal washings and  aspirates are unacceptable for Xpert Xpress SARS-CoV-2/FLU/RSV  testing.  Fact Sheet for Patients: PinkCheek.be  Fact Sheet for Healthcare Providers: GravelBags.it  This test is not yet approved or cleared by the [place] FDA and  has been authorized for detection and/or diagnosis of SARS-CoV-2 by  FDA under an Emergency Use Authorization (EUA). This EUA will remain  in effect (meaning this test can be used) for the duration of the  Covid-19 declaration under Section 564(b)(1) of the Act, 21  U.S.C. section 360bbb-3(b)(1), unless the authorization is  terminated or revoked. Performed at [facility], [address] [place], [place] [address]   Urinalysis, Complete w Microscopic     Status: Abnormal   Collection Time: 01/07/20  5:20 AM  Result Value Ref Range   Color, Urine STRAW (A) YELLOW   APPearance CLEAR (A) CLEAR   Specific Gravity, Urine 1.003 (L) 1.005 - 1.030   pH 6.0 5.0 - 8.0   Glucose, UA NEGATIVE NEGATIVE mg/dL   Hgb urine dipstick SMALL (A) NEGATIVE   Bilirubin Urine NEGATIVE NEGATIVE   Ketones, ur 5 (A)  NEGATIVE mg/dL   Protein, ur NEGATIVE NEGATIVE mg/dL   Nitrite NEGATIVE NEGATIVE   Leukocytes,Ua NEGATIVE NEGATIVE   RBC / HPF 0-5 0 - 5 RBC/hpf   WBC, UA 0-5 0 - 5 WBC/hpf   Bacteria, UA NONE SEEN NONE SEEN   Squamous Epithelial / LPF 0-5 0 - 5   Mucus PRESENT     Comment: Performed at  [facility], [place], [place] [address]  Troponin I (High Sensitivity)     Status: None   Collection Time: 01/07/20  8:14 AM  Result Value Ref Range   Troponin I (High Sensitivity) 13 <18 ng/L    Comment: (NOTE) Elevated high sensitivity troponin I (hsTnI) values and significant  changes across serial measurements may suggest ACS but many other  chronic and acute conditions are known to elevate hsTnI results.  Refer to the "Links" section for chest pain algorithms and additional  guidance. Performed at [facility], [place], [place] [address]   Troponin I (High Sensitivity)     Status: None   Collection Time: 01/07/20 11:38 AM  Result Value Ref Range   Troponin I (High Sensitivity) 13 <18 ng/L    Comment: (NOTE) Elevated high sensitivity troponin I (hsTnI) values and significant  changes across serial measurements may suggest ACS but many other  chronic and acute conditions are known to elevate hsTnI results.  Refer to the "Links" section for chest pain algorithms and additional  guidance. Performed at [facility], [address] [place], [place] [address]   Basic metabolic panel     Status: Abnormal   Collection Time: 01/08/20  5:12 AM  Result Value Ref Range   Sodium 141 135 - 145 mmol/L   Potassium 4.1 3.5 - 5.1 mmol/L   Chloride 109 98 - 111 mmol/L   CO2 22 22 - 32 mmol/L   Glucose, Bld 68 (L) 70 - 99 mg/dL    Comment: Glucose reference range applies only to samples taken after fasting for at least 8 hours.   BUN 21 8 - 23 mg/dL   Creatinine, Ser 1.74 (H) 0.44 - 1.00 mg/dL   Calcium 8.3 (L) 8.9 - 10.3 mg/dL   GFR, Estimated 27 (L) >60 mL/min   Anion gap 10 5 - 15    Comment: Performed at [facility], [address] [place], [place] [address]  CBC     Status: Abnormal   Collection Time: 01/08/20  5:12 AM  Result Value Ref Range   WBC 7.9 4.0 - 10.5 K/uL   RBC 3.00 (L) 3.87 - 5.11 MIL/uL    Hemoglobin 9.3 (L) 12.0 - 15.0 g/dL   HCT 28.7 (L) 36 - 46 %   MCV 95.7 80.0 - 100.0 fL   MCH 31.0 26.0 - 34.0 pg   MCHC 32.4 30.0 - 36.0 g/dL   RDW 13.4 11.5 - 15.5 %   Platelets 227 150 - 400 K/uL   nRBC 0.0 0.0 - 0.2 %    Comment: Performed at [facility], [address] [place], [place] [address]  Hemoglobin     Status: Abnormal   Collection Time: 01/08/20  1:38 PM  Result Value Ref Range   Hemoglobin 10.7 (L) 12.0 - 15.0 g/dL    Comment: Performed at [facility] [place], [address] [place], [place] [address]  CBC     Status: Abnormal   Collection Time: 01/09/20  5:11 AM  Result Value Ref Range  WBC 6.7 4.0 - 10.5 K/uL   RBC 3.04 (L) 3.87 - 5.11 MIL/uL   Hemoglobin 10.0 (L) 12.0 - 15.0 g/dL   HCT 28.9 (L) 36 - 46 %   MCV 95.1 80.0 - 100.0 fL   MCH 32.9 26.0 - 34.0 pg   MCHC 34.6 30.0 - 36.0 g/dL   RDW 13.3 11.5 - 15.5 %   Platelets 238 150 - 400 K/uL   nRBC 0.0 0.0 - 0.2 %    Comment: Performed at [facility], [address] [place], [place] [address]  Basic metabolic panel     Status: Abnormal   Collection Time: 01/09/20  5:11 AM  Result Value Ref Range   Sodium 139 135 - 145 mmol/L   Potassium 3.7 3.5 - 5.1 mmol/L   Chloride 107 98 - 111 mmol/L   CO2 23 22 - 32 mmol/L   Glucose, Bld 90 70 - 99 mg/dL    Comment: Glucose reference range applies only to samples taken after fasting for at least 8 hours.   BUN 17 8 - 23 mg/dL   Creatinine, Ser 1.59 (H) 0.44 - 1.00 mg/dL   Calcium 8.5 (L) 8.9 - 10.3 mg/dL   GFR, Estimated 31 (L) >60 mL/min   Anion gap 9 5 - 15    Comment: Performed at [facility], [address], [place], [place] [address]    -------------------------------------------------------------------------- A&P:  Problem List Items Addressed This Visit    Acute kidney injury superimposed on CKD ([place])    Other Visit Diagnoses    Diverticulitis    -  Primary   Relevant Medications   ciprofloxacin (CIPRO) 500 MG  tablet   metroNIDAZOLE (FLAGYL) 500 MG tablet     Hosp f/u Acute Diverticulitis, improving S/p Zosyn IV antibiotic in hospital, then discharged to Augmentin oral stopped this w/ possible allergic reaction to it now improving however now she is not convinced it was Augmentin. - In past 05/2019 last Diverticulitis flare resolved with Cipro and Flagyl. - Now some gradual return of abdominal symptoms  STOP Augmentin Switch to new RX Cipro 500 BID x 7 days and Flagyl 500 BID for 7 days May use anti yeast medicine PRN if yeast infection or contact us for diflucan if need Resume other meds as she already has Offered refer to GI - we will hold off on this for now, if recurrent diverticulitis we can reconsider.  Agree mutually to STOP Aspirin 81mg  daily, for ASCVD risk reduction in past possible TIA occurrence only. But since then has not had issues. May be contributing to bleeding issue.  Creatinine has improved, AKI has resolved to baseline now.    Meds ordered this encounter  Medications  . ciprofloxacin (CIPRO) 500 MG tablet    Sig: Take 1 tablet (500 mg total) by mouth 2 (two) times daily.    Dispense:  14 tablet    Refill:  0  . metroNIDAZOLE (FLAGYL) 500 MG tablet    Sig: Take 1 tablet (500 mg total) by mouth 2 (two) times daily.    Dispense:  14 tablet    Refill:  0    Follow-up: - Return as needed  Patient verbalizes understanding with the above medical recommendations including the limitation of remote medical advice.  Specific follow-up and call-back criteria were given for patient to follow-up or seek medical care more urgently if needed.   - Time spent in direct consultation with patient on phone: 15 minutes   Sheppard Coil  Parks Ranger, [facility] 01/17/2020, 2:41 PM

## 2020-01-18 ENCOUNTER — Ambulatory Visit
Admission: RE | Admit: 2020-01-18 | Discharge: 2020-01-18 | Disposition: A | Discharge status: Home / Self Care | Payer: Medicare Other | Source: Ambulatory Visit | Attending: Radiation Oncology | Admitting: Radiation Oncology

## 2020-01-18 ENCOUNTER — Encounter: Payer: Self-pay | Admitting: *Deleted

## 2020-01-18 DIAGNOSIS — Z510 Encounter for antineoplastic radiation therapy: Secondary | ICD-10-CM | POA: Diagnosis not present

## 2020-01-19 NOTE — Progress Notes (Signed)
  Oncology Nurse Navigator Documentation  Navigator Location: [facility] (01/19/20 1500)   )Navigator Encounter Type: Treatment (01/19/20 1500)                   Treatment Initiated Date: 01/16/20 (01/19/20 1500) Patient Visit Type: RadOnc (01/19/20 1500) Treatment Phase: Active Tx (01/19/20 1500) Barriers/Navigation Needs: No Barriers At This Time (01/19/20 1500)   Interventions: None Required (01/19/20 1500)                      Time Spent with Patient: 30 (01/19/20 1500)

## 2020-01-21 ENCOUNTER — Observation Stay
Admission: EM | Admit: 2020-01-21 | Discharge: 2020-01-22 | Disposition: A | Discharge status: Home / Self Care | Payer: Medicare Other | Attending: Hospitalist | Admitting: Hospitalist

## 2020-01-21 ENCOUNTER — Other Ambulatory Visit: Payer: Self-pay

## 2020-01-21 ENCOUNTER — Encounter: Payer: Self-pay | Admitting: Emergency Medicine

## 2020-01-21 DIAGNOSIS — N179 Acute kidney failure, unspecified: Secondary | ICD-10-CM | POA: Insufficient documentation

## 2020-01-21 DIAGNOSIS — Z85118 Personal history of other malignant neoplasm of bronchus and lung: Secondary | ICD-10-CM | POA: Insufficient documentation

## 2020-01-21 DIAGNOSIS — Z91040 Latex allergy status: Secondary | ICD-10-CM | POA: Insufficient documentation

## 2020-01-21 DIAGNOSIS — Z79899 Other long term (current) drug therapy: Secondary | ICD-10-CM | POA: Insufficient documentation

## 2020-01-21 DIAGNOSIS — E871 Hypo-osmolality and hyponatremia: Secondary | ICD-10-CM | POA: Diagnosis not present

## 2020-01-21 DIAGNOSIS — N183 Chronic kidney disease, stage 3 unspecified: Secondary | ICD-10-CM | POA: Diagnosis present

## 2020-01-21 DIAGNOSIS — J432 Centrilobular emphysema: Secondary | ICD-10-CM | POA: Diagnosis present

## 2020-01-21 DIAGNOSIS — Z20822 Contact with and (suspected) exposure to covid-19: Secondary | ICD-10-CM | POA: Insufficient documentation

## 2020-01-21 DIAGNOSIS — R42 Dizziness and giddiness: Secondary | ICD-10-CM | POA: Diagnosis present

## 2020-01-21 DIAGNOSIS — I129 Hypertensive chronic kidney disease with stage 1 through stage 4 chronic kidney disease, or unspecified chronic kidney disease: Secondary | ICD-10-CM | POA: Diagnosis not present

## 2020-01-21 DIAGNOSIS — Z87891 Personal history of nicotine dependence: Secondary | ICD-10-CM | POA: Insufficient documentation

## 2020-01-21 DIAGNOSIS — F419 Anxiety disorder, unspecified: Secondary | ICD-10-CM | POA: Diagnosis present

## 2020-01-21 DIAGNOSIS — E890 Postprocedural hypothyroidism: Secondary | ICD-10-CM | POA: Diagnosis present

## 2020-01-21 LAB — URINALYSIS, COMPLETE (UACMP) WITH MICROSCOPIC
Bacteria, UA: NONE SEEN
Bilirubin Urine: NEGATIVE
Glucose, UA: NEGATIVE mg/dL
Hgb urine dipstick: NEGATIVE
Ketones, ur: NEGATIVE mg/dL
Leukocytes,Ua: NEGATIVE
Nitrite: NEGATIVE
Protein, ur: NEGATIVE mg/dL
Specific Gravity, Urine: 1.001 — ABNORMAL LOW (ref 1.005–1.030)
Squamous Epithelial / HPF: NONE SEEN (ref 0–5)
WBC, UA: NONE SEEN WBC/hpf (ref 0–5)
pH: 5 (ref 5.0–8.0)

## 2020-01-21 LAB — CBC WITH DIFFERENTIAL/PLATELET
Abs Immature Granulocytes: 0.08 10*3/uL — ABNORMAL HIGH (ref 0.00–0.07)
Basophils Absolute: 0.1 10*3/uL (ref 0.0–0.1)
Basophils Relative: 1 %
Eosinophils Absolute: 0.2 10*3/uL (ref 0.0–0.5)
Eosinophils Relative: 1 %
HCT: 32.2 % — ABNORMAL LOW (ref 36.0–46.0)
Hemoglobin: 11.5 g/dL — ABNORMAL LOW (ref 12.0–15.0)
Immature Granulocytes: 1 %
Lymphocytes Relative: 18 %
Lymphs Abs: 2.3 10*3/uL (ref 0.7–4.0)
MCH: 32.3 pg (ref 26.0–34.0)
MCHC: 35.7 g/dL (ref 30.0–36.0)
MCV: 90.4 fL (ref 80.0–100.0)
Monocytes Absolute: 0.8 10*3/uL (ref 0.1–1.0)
Monocytes Relative: 6 %
Neutro Abs: 9.7 10*3/uL — ABNORMAL HIGH (ref 1.7–7.7)
Neutrophils Relative %: 73 %
Platelets: 453 10*3/uL — ABNORMAL HIGH (ref 150–400)
RBC: 3.56 MIL/uL — ABNORMAL LOW (ref 3.87–5.11)
RDW: 12.8 % (ref 11.5–15.5)
WBC: 13.1 10*3/uL — ABNORMAL HIGH (ref 4.0–10.5)
nRBC: 0 % (ref 0.0–0.2)

## 2020-01-21 LAB — BASIC METABOLIC PANEL
Anion gap: 13 (ref 5–15)
BUN: 25 mg/dL — ABNORMAL HIGH (ref 8–23)
CO2: 19 mmol/L — ABNORMAL LOW (ref 22–32)
Calcium: 8.6 mg/dL — ABNORMAL LOW (ref 8.9–10.3)
Chloride: 88 mmol/L — ABNORMAL LOW (ref 98–111)
Creatinine, Ser: 2 mg/dL — ABNORMAL HIGH (ref 0.44–1.00)
GFR, Estimated: 25 mL/min — ABNORMAL LOW (ref >60)
Glucose, Bld: 86 mg/dL (ref 70–99)
Potassium: 3.8 mmol/L (ref 3.5–5.1)
Sodium: 120 mmol/L — ABNORMAL LOW (ref 135–145)

## 2020-01-21 LAB — RESPIRATORY PANEL BY RT PCR (FLU A&B, COVID)
Influenza A by PCR: NEGATIVE
Influenza B by PCR: NEGATIVE
SARS Coronavirus 2 by RT PCR: NEGATIVE

## 2020-01-21 LAB — SODIUM, URINE, RANDOM: Sodium, Ur: 18 mmol/L

## 2020-01-21 LAB — CREATININE, URINE, RANDOM: Creatinine, Urine: 13 mg/dL

## 2020-01-21 LAB — TSH: TSH: 7.027 u[IU]/mL — ABNORMAL HIGH (ref 0.350–4.500)

## 2020-01-21 LAB — MAGNESIUM: Magnesium: 1.7 mg/dL (ref 1.7–2.4)

## 2020-01-21 LAB — OSMOLALITY, URINE: Osmolality, Ur: 83 mOsm/kg — ABNORMAL LOW (ref 300–900)

## 2020-01-21 LAB — TROPONIN I (HIGH SENSITIVITY): Troponin I (High Sensitivity): 8 ng/L (ref <18)

## 2020-01-21 LAB — OSMOLALITY: Osmolality: 256 mOsm/kg — ABNORMAL LOW (ref 275–295)

## 2020-01-21 MED ORDER — METRONIDAZOLE IN NACL 5-0.79 MG/ML-% IV SOLN
500.0000 mg | Freq: Three times a day (TID) | INTRAVENOUS | Status: DC
  Administered 2020-01-22 (×2): 500 mg via INTRAVENOUS
  Filled 2020-01-21 (×2): qty 100

## 2020-01-21 MED ORDER — ONDANSETRON HCL 4 MG PO TABS: 4.0000 mg | ORAL_TABLET | Freq: Four times a day (QID) | ORAL | Status: DC | PRN

## 2020-01-21 MED ORDER — NEBIVOLOL HCL 5 MG PO TABS
5.0000 mg | ORAL_TABLET | Freq: Every day | ORAL | Status: DC
  Filled 2020-01-21: qty 1

## 2020-01-21 MED ORDER — ACETAMINOPHEN 325 MG PO TABS: 650.0000 mg | ORAL_TABLET | Freq: Four times a day (QID) | ORAL | Status: DC | PRN

## 2020-01-21 MED ORDER — CIPROFLOXACIN IN D5W 400 MG/200ML IV SOLN
400.0000 mg | INTRAVENOUS | Status: DC
  Administered 2020-01-21: 400 mg via INTRAVENOUS
  Filled 2020-01-21: qty 200

## 2020-01-21 MED ORDER — ACETAMINOPHEN 650 MG RE SUPP: 650.0000 mg | Freq: Four times a day (QID) | RECTAL | Status: DC | PRN

## 2020-01-21 MED ORDER — SODIUM CHLORIDE 0.9 % IV SOLN: INTRAVENOUS | Status: AC

## 2020-01-21 MED ORDER — ALBUTEROL SULFATE HFA 108 (90 BASE) MCG/ACT IN AERS
2.0000 | INHALATION_SPRAY | RESPIRATORY_TRACT | Status: DC | PRN
  Filled 2020-01-21: qty 6.7

## 2020-01-21 MED ORDER — LACTATED RINGERS IV BOLUS
1000.0000 mL | Freq: Once | INTRAVENOUS | Status: AC
  Administered 2020-01-21: 1000 mL via INTRAVENOUS

## 2020-01-21 MED ORDER — ONDANSETRON HCL 4 MG/2ML IJ SOLN: 4.0000 mg | Freq: Four times a day (QID) | INTRAMUSCULAR | Status: DC | PRN

## 2020-01-21 MED ORDER — HEPARIN SODIUM (PORCINE) 5000 UNIT/ML IJ SOLN
5000.0000 [IU] | Freq: Three times a day (TID) | INTRAMUSCULAR | Status: DC
  Administered 2020-01-21 – 2020-01-22 (×2): 5000 [IU] via SUBCUTANEOUS
  Filled 2020-01-21 (×2): qty 1

## 2020-01-21 MED ORDER — LEVOTHYROXINE SODIUM 88 MCG PO TABS
88.0000 ug | ORAL_TABLET | Freq: Every day | ORAL | Status: DC
  Administered 2020-01-22: 88 ug via ORAL
  Filled 2020-01-21: qty 1

## 2020-01-21 MED ORDER — NIFEDIPINE ER 60 MG PO TB24
60.0000 mg | ORAL_TABLET | Freq: Every evening | ORAL | Status: DC
  Filled 2020-01-21: qty 1

## 2020-01-21 MED ORDER — LORAZEPAM 0.5 MG PO TABS
0.5000 mg | ORAL_TABLET | Freq: Every evening | ORAL | Status: DC | PRN
  Administered 2020-01-21: 0.5 mg via ORAL
  Filled 2020-01-21: qty 1

## 2020-01-21 NOTE — ED Notes (Signed)
Pt states coming in for feeling out of it. Pt states she was diagnosed with diverticulitis and is on antibiotics for it. Pt states feeling like she needs IV fluids.  Pt placed on cardiac, bp and pulse ox monitor.

## 2020-01-21 NOTE — ED Triage Notes (Signed)
Pt arrived via POV with reports of dizziness and having some numbness that comes and goes since this morning.  Pt reports she is worried about being dehydrated and states her doctor is concerned about her kidneys as well. Pt thinks her sxs are related to medications she is taking.

## 2020-01-21 NOTE — ED Provider Notes (Addendum)
[facility] Emergency Department Provider Note   ____________________________________________   First MD Initiated Contact with Patient 01/21/20 1953     (approximate)  I have reviewed the triage vital signs and the nursing notes.   HISTORY  Chief Complaint Near Syncope    HPI Margaret Black is a 79 y.o. female with possible history of hypertension, CKD, GERD, anxiety, and lung cancer who presents to the ED complaining of lightheadedness.  Patient was recently discharged from the hospital following bout of diverticulitis, states her stomach has been feeling much better and she has not had any vomiting or diarrhea for the past couple of days.  She became concerned earlier this evening when she started to feel lightheaded like she was going to pass out.  This was associated with some numbness and tingling in her arms, but she denies any weakness, vision changes, or speech changes.  She continues to feel very jittery and lightheaded, but she denies any fevers, cough, chest pain, or shortness of breath.        Past Medical History:  Diagnosis Date  . Anxiety    Related to HTN and vision loss  . Central retinal vein occlusion of left eye    legally blind in Left eye  . 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 Collagen vascular disease ([place])    uncharacterized.  has seen rheum.  positive RF, positive serologies for Sjogrens.  C-ANCA/P-ANCA neg, ANA  neg, anti-dsDNA neg, anti-SCL70 neg, anti-centromere Ab neg  . COPD (chronic obstructive pulmonary disease) ([place])    quit smoking 1993  . Diverticulosis   . Dysfunction of eustachian tube   . Fatigue   . GERD (gastroesophageal reflux disease)   . Goiter, unspecified   . HLD (hyperlipidemia)   . HTN (hypertension)    difficult to control, hypertensive urgency hospitalizations, normal urine/plasma metanephrines and catecholamines, renal dopplers without stenosis  .  Hyperaldosteronism    serum aldo/renin ration elevated (aldo 23.7, renin <0.15), CT abd 2010 with normal adrenals, dedicated adrenal CT without adrenal adenoma,   . Hypothyroid   . Insomnia    Related to HTN and vision loss  . Irritable bowel syndrome   . Leiomyosarcoma of uterus ([place]) 09/22/2014  . Lung cancer [facility])    s/p R lobectomy 2001  . Lupus ([place])   . Osteopenia   . Pulmonary hypertension ([place])    echo [facility] 12/2008 with EF >5%, grade II diastolic dysfunction, RSVP >54mmHg, ACE level normal.  f/u echo at [place] - EF 55-60%, mild MR, mild LAE, PASP 25 + RA (no pulm HTN)  . Thyroid cancer ([place]) 2014  . Urge incontinence   . Uterine cancer [facility])    s/p hysterectomy 2001    Patient Active Problem List   Diagnosis Date Noted  . Acute diverticulitis   . Acute kidney injury superimposed on CKD ([place])   . Essential hypertension   . Diverticulitis of colon with bleeding 01/07/2020  . Lung cancer ([place])   . Hyponatremia   . Aortic atherosclerosis ([place]) 10/24/2019  . Centrilobular emphysema ([place]) 10/24/2019  . Abdominal pain 06/21/2019  . Anxiety 06/12/2017  . Rheumatoid factor positive 02/26/2017  . Brachial radiculitis 12/25/2016  . Closed traumatic dislocation of cervical vertebra 12/25/2016  . Weakness of limb 12/25/2016  . Insomnia 02/08/2015  . Carotid stenosis 02/08/2015  . Solitary pulmonary nodule 03/31/2014  . Systemic lupus ([place]) 12/15/2013  . Near syncope 05/03/2013  .  Vertigo 08/09/2012  . Hx of cancer of lung 07/14/2012  . History of thyroid cancer 06/30/2012  . Livedo reticularis 06/11/2012  . Antiphospholipid antibody positive 06/11/2012  . CRVO (central retinal vein occlusion) 05/25/2012  . Rash 04/06/2012  . Syncope 09/29/2011  . Stage III chronic kidney disease ([place]) 10/09/2010  . HYPERALDOSTERONISM UNSPECIFIED 01/12/2009  . HYPERPLASIA, ADRENAL 01/12/2009  . HYPERTENSION, PULMONARY 01/04/2009  . Blindness of left eye 12/12/2008  . IBS 07/12/2007  .  Hypercholesterolemia 04/27/2007  . INCONTINENCE, URGE 04/23/2007  . Postsurgical hypothyroidism 06/22/2006  . Benign hypertension with CKD (chronic kidney disease) stage III ([place]) 06/22/2006  . GERD 06/22/2006  . Osteopenia 06/22/2006  . UTERINE CANCER, HX OF 06/22/2006  . DIVERTICULITIS, HX OF 06/22/2006    Past Surgical History:  Procedure Laterality Date  . ABDOMINAL HYSTERECTOMY  06/1999  . APPENDECTOMY  1958  . CHOLECYSTECTOMY  1982  . CT ABD W & PELVIS WO CM  2010   neg except diverticulosis  . CT chest  2007   enlarged thyroid with goiter  . LOBECTOMY Right 04/1999   right lung, done in [place]  . lung nodule excision Left 2003   benign  . THYROIDECTOMY  2014  . TUBAL LIGATION  1975    Prior to Admission medications   Medication Sig Start Date End Date Taking? Authorizing Provider  acidophilus (RISAQUAD) CAPS capsule 2 capsules daily (okay to substitute any probiotic) Patient not taking: Reported on 01/17/2020 01/09/20   Loletha Grayer, MD  albuterol (VENTOLIN HFA) 108 (90 Base) MCG/ACT inhaler Inhale 2 puffs into the lungs every 4 (four) hours as needed for wheezing or shortness of breath (cough). Patient not taking: Reported on 01/17/2020 10/24/19   Olin Hauser, DO  BYSTOLIC 5 MG tablet Take 1 tablet (5 mg total) by mouth daily. 08/23/19   Karamalegos, Devonne Doughty, DO  CALCIUM-VITAMIN D PO Take 1 tablet by mouth daily.  Patient not taking: Reported on 01/17/2020    [provider]  ciprofloxacin (CIPRO) 500 MG tablet Take 1 tablet (500 mg total) by mouth 2 (two) times daily. 01/17/20   Karamalegos, Devonne Doughty, DO  hydrocortisone (ANUSOL-HC) 2.5 % rectal cream Place rectally 3 (three) times daily. Patient not taking: Reported on 01/17/2020 01/09/20   Loletha Grayer, MD  LORazepam (ATIVAN) 0.5 MG tablet Take 1 tablet (0.5 mg total) by mouth at bedtime. 10/25/19   Karamalegos, Devonne Doughty, DO  metroNIDAZOLE (FLAGYL) 500 MG tablet Take 1 tablet (500 mg  total) by mouth 2 (two) times daily. 01/17/20   Karamalegos, Devonne Doughty, DO  Multiple Vitamin (MULTIVITAMIN) tablet Take 1 tablet by mouth daily. Patient not taking: Reported on 01/17/2020    [provider]  NIFEdipine (PROCARDIA XL/NIFEDICAL XL) 60 MG 24 hr tablet TAKE 1 TABLET EVERY EVENING Patient taking differently: Take 60 mg by mouth every evening. TAKE 1 TABLET EVERY EVENING 11/11/19   Karamalegos, Devonne Doughty, DO  pantoprazole (PROTONIX) 40 MG tablet Take 1 tablet (40 mg total) by mouth daily. 01/10/20   Loletha Grayer, MD  SYNTHROID 88 MCG tablet Take 1 tablet (88 mcg total) by mouth daily before breakfast. 09/15/19   Par[place] Ranger, Devonne Doughty, DO  vitamin C (ASCORBIC ACID) 500 MG tablet Take 500 mg by mouth daily. Patient not taking: Reported on 01/17/2020    [provider]    Allergies Lactase, Atenolol, and Latex  Family History  Problem Relation Age of Onset  . Hypertension Mother   . Pneumonia Father   .  Alcohol abuse Father     Social History Social History   Tobacco Use  . Smoking status: Former Smoker    Types: Cigarettes    Quit date: 07/04/1991    Years since quitting: 28.5  . Smokeless tobacco: Former Systems developer  . Tobacco comment: Quit in early 1980's.  Vaping Use  . Vaping Use: Never used  Substance Use Topics  . Alcohol use: No  . Drug use: No    Review of Systems  Constitutional: No fever/chills.  Positive for dizziness and lightheadedness. Eyes: No visual changes. ENT: No sore throat. Cardiovascular: Denies chest pain. Respiratory: Denies shortness of breath. Gastrointestinal: No abdominal pain.  No nausea, no vomiting.  No diarrhea.  No constipation. Genitourinary: Negative for dysuria. Musculoskeletal: Negative for back pain. Skin: Negative for rash. Neurological: Negative for headaches, focal weakness or numbness.  Positive for paresthesias.  ____________________________________________   PHYSICAL EXAM:  VITAL SIGNS: ED  Triage Vitals  Enc Vitals Group     BP 01/21/20 1856 132/63     Pulse Rate 01/21/20 1856 80     Resp 01/21/20 1856 18     Temp 01/21/20 1856 97.8 F (36.6 C)     Temp Source 01/21/20 1856 Oral     SpO2 01/21/20 1856 100 %     Weight 01/21/20 1856 125 lb (56.7 kg)     Height 01/21/20 1856 5\' 5"  (1.651 m)     Head Circumference --      Peak Flow --      Pain Score 01/21/20 1939 0     Pain Loc --      Pain Edu? --      Excl. in [place]? --     Constitutional: Alert and oriented.  Anxious appearing. Eyes: Conjunctivae are normal. Head: Atraumatic. Nose: No congestion/rhinnorhea. Mouth/Throat: Mucous membranes are moist. Neck: Normal ROM Cardiovascular: Normal rate, regular rhythm. Grossly normal heart sounds. Respiratory: Normal respiratory effort.  No retractions. Lungs CTAB. Gastrointestinal: Soft and nontender. No distention. Genitourinary: deferred Musculoskeletal: No lower extremity tenderness nor edema. Neurologic:  Normal speech and language. No gross focal neurologic deficits are appreciated. Skin:  Skin is warm, dry and intact. No rash noted. Psychiatric: Mood and affect are normal. Speech and behavior are normal.  ____________________________________________   LABS (all labs ordered are listed, but only abnormal results are displayed)  Labs Reviewed  CBC WITH DIFFERENTIAL/PLATELET - Abnormal; Notable for the following components:      Result Value   WBC 13.1 (*)    RBC 3.56 (*)    Hemoglobin 11.5 (*)    HCT 32.2 (*)    Platelets 453 (*)    Neutro Abs 9.7 (*)    Abs Immature Granulocytes 0.08 (*)    All other components within normal limits  BASIC METABOLIC PANEL - Abnormal; Notable for the following components:   Sodium 120 (*)    Chloride 88 (*)    CO2 19 (*)    BUN 25 (*)    Creatinine, Ser 2.00 (*)    Calcium 8.6 (*)    GFR, Estimated 25 (*)    All other components within normal limits  RESPIRATORY PANEL BY RT PCR (FLU A&B, COVID)  URINALYSIS, COMPLETE  (UACMP) WITH MICROSCOPIC  TROPONIN I (HIGH SENSITIVITY)   ____________________________________________  EKG  ED ECG REPORT I, Blake Divine, the attending physician, personally viewed and interpreted this ECG.   Date: 01/21/2020  EKG Time: 20:25  Rate: 74  Rhythm: normal sinus rhythm  Axis:  Normal  Intervals:none  ST&T Change: None   PROCEDURES  Procedure(s) performed (including Critical Care):  .Critical Care Performed by: Blake Divine, MD Authorized by: Blake Divine, MD   Critical care provider statement:    Critical care time (minutes):  45   Critical care time was exclusive of:  Separately billable procedures and treating other patients and teaching time   Critical care was necessary to treat or prevent imminent or life-threatening deterioration of the following conditions:  Metabolic crisis   Critical care was time spent personally by me on the following activities:  Discussions with consultants, evaluation of patient's response to treatment, examination of patient, ordering and performing treatments and interventions, ordering and review of laboratory studies, ordering and review of radiographic studies, pulse oximetry, re-evaluation of patient's condition, obtaining history from patient or surrogate and review of old charts     ____________________________________________   INITIAL IMPRESSION / [place] / ED COURSE       79 year old female with possible history of hypertension, CKD, GERD, lung cancer, and anxiety who presents to the ED complaining of lightheadedness along with numbness and tingling in her bilateral arms starting this evening.  She has no focal neurologic deficits on exam and I doubt stroke given her bilateral tingling.  EKG shows no evidence of arrhythmia or ischemia, low suspicion for cardiac etiology for her symptoms but we will screen troponin.  She does have a history of hyponatremia and we will check CBC and BMP, hydrate with  IV fluids.  No symptoms at this time to suggest an infectious process.  Labs remarkable for hyponatremia as well as mild AKI.  Hyponatremia could explain her lightheadedness along with paresthesias.  She does take spironolactone and overall appears hypovolemic.  She is currently receiving IV fluids and case discussed with hospitalist for admission.      ____________________________________________   FINAL CLINICAL IMPRESSION(S) / ED DIAGNOSES  Final diagnoses:  Hyponatremia  AKI (acute kidney injury) [facility])     ED Discharge Orders    None       Note:  This document was prepared using Dragon voice recognition software and may include unintentional dictation errors.   Blake Divine, MD 01/21/20 2141    Blake Divine, MD 01/21/20 2142

## 2020-01-21 NOTE — ED Notes (Signed)
Pt sitting in recliner, call bell with in reach. Pt states she is more comfortable sitting than laying in bed.

## 2020-01-21 NOTE — ED Notes (Signed)
ER provider at bedside.

## 2020-01-21 NOTE — ED Notes (Signed)
Admit provider at bedside 

## 2020-01-21 NOTE — H&P (Signed)
History and Physical    MALAYNA NOORI ZOX:[id number] DOB: Dec 16, 1940 DOA: 01/21/2020  PCP: Olin Hauser, DO  Patient coming from: Home  I have personally briefly reviewed patient's old medical records in [place]  Chief Complaint: Dizziness and feeling dehydrated  HPI: BULAH LURIE is a 79 y.o. female with medical history significant for COPD, CKD stage IIIb, hyperaldosteronism, hypertension, hyperlipidemia, acquired hypothyroidism after thyroidectomy for thyroid cancer, uterine leiomyosarcoma s/p hysterectomy and right middle lung lobectomy, presumed stage I non-small cell lung cancer of left upper lobe currently undergoing SBRT, and anemia of chronic kidney disease who presents to the ED for evaluation of dizziness and feeling dehydrated.  Patient was recently admitted from 01/07/2020-01/09/2020 for diverticulitis.  She was treated with IV Zosyn in hospital and discharged on Augmentin.  She had associated AKI on CKD and her home spironolactone and enalapril were held on discharge.  She had follow-up with her PCP on 10/26 and antibiotics were switched to Cipro and Flagyl for 7-day course due to possible allergic reaction to Augmentin.  She had resumed spironolactone and enalapril at that time per documentation.  Patient states that since she has been in the hospital she has had poor oral intake. She has had some nausea without emesis. She has some mild continued left lower quadrant abdominal pain. She says she has been feeling "foggy headed" and lightheaded recently. She has had some numbness/tingling and slight tremor in both of her hands. She says she has not fallen or lost consciousness. She describes lightheadedness as feeling off balance and denies any room spinning sensation. She denies any further fevers, chills, diaphoresis. She denies any chest pain, dyspnea, cough, abdominal pain, or diarrhea. She currently feels too nauseous to resume her oral medications.  ED  Course:  Initial vitals showed BP 132/63, pulse 80, RR 18, temp 97.8 Fahrenheit, SPO2 100% on room air.  Labs notable for WBC 13.1, hemoglobin 11.5, platelets 453,000, sodium 120 (previously 139 on 01/09/2020), potassium 3.8, bicarb 19, BUN 25, creatinine 2.00 (baseline creatinine ~1.4-1.5), serum glucose 86, high-sensitivity troponin I 8.  SARS-CoV-2 PCR is collected and pending.  Patient was given 1 L LR and hospitalist service was consulted to admit for further evaluation and management.  Review of Systems: All systems reviewed and are negative except as documented in history of present illness above.   Past Medical History:  Diagnosis Date  . Anxiety    Related to HTN and vision loss  . Central retinal vein occlusion of left eye    legally blind in Left eye  . 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 Collagen vascular disease ([place])    uncharacterized.  has seen rheum.  positive RF, positive serologies for Sjogrens.  C-ANCA/P-ANCA neg, ANA  neg, anti-dsDNA neg, anti-SCL70 neg, anti-centromere Ab neg  . COPD (chronic obstructive pulmonary disease) ([place])    quit smoking 1993  . Diverticulosis   . Dysfunction of eustachian tube   . Fatigue   . GERD (gastroesophageal reflux disease)   . Goiter, unspecified   . HLD (hyperlipidemia)   . HTN (hypertension)    difficult to control, hypertensive urgency hospitalizations, normal urine/plasma metanephrines and catecholamines, renal dopplers without stenosis  . Hyperaldosteronism    serum aldo/renin ration elevated (aldo 23.7, renin <0.15), CT abd 2010 with normal adrenals, dedicated adrenal CT without adrenal adenoma,   . Hypothyroid   . Insomnia    Related to HTN and vision loss  .  Irritable bowel syndrome   . Leiomyosarcoma of uterus ([place]) 09/22/2014  . Lung cancer [facility])    s/p R lobectomy 2001  . Lupus ([place])   . Osteopenia   . Pulmonary hypertension ([place])    echo [facility] 12/2008 with EF >5%,  grade II diastolic dysfunction, RSVP >44mmHg, ACE level normal.  f/u echo at [facility] - EF 55-60%, mild MR, mild LAE, PASP 25 + RA (no pulm HTN)  . Thyroid cancer ([place]) 2014  . Urge incontinence   . Uterine cancer [facility])    s/p hysterectomy 2001    Past Surgical History:  Procedure Laterality Date  . ABDOMINAL HYSTERECTOMY  06/1999  . APPENDECTOMY  1958  . CHOLECYSTECTOMY  1982  . CT ABD W & PELVIS WO CM  2010   neg except diverticulosis  . CT chest  2007   enlarged thyroid with goiter  . LOBECTOMY Right 04/1999   right lung, done in [place]  . lung nodule excision Left 2003   benign  . THYROIDECTOMY  2014  . TUBAL LIGATION  1975    Social History:  reports that she quit smoking about 28 years ago. Her smoking use included cigarettes. She has quit using smokeless tobacco. She reports that she does not drink alcohol and does not use drugs.  Allergies  Allergen Reactions  . Lactase Diarrhea  . Atenolol Rash  . Latex Rash    Family History  Problem Relation Age of Onset  . Hypertension Mother   . Pneumonia Father   . Alcohol abuse Father      Prior to Admission medications   Medication Sig Start Date End Date Taking? Authorizing Provider  acidophilus (RISAQUAD) CAPS capsule 2 capsules daily (okay to substitute any probiotic) Patient not taking: Reported on 01/17/2020 01/09/20   Loletha Grayer, MD  albuterol (VENTOLIN HFA) 108 (90 Base) MCG/ACT inhaler Inhale 2 puffs into the lungs every 4 (four) hours as needed for wheezing or shortness of breath (cough). Patient not taking: Reported on 01/17/2020 10/24/19   Olin Hauser, DO  BYSTOLIC 5 MG tablet Take 1 tablet (5 mg total) by mouth daily. 08/23/19   Karamalegos, Devonne Doughty, DO  CALCIUM-VITAMIN D PO Take 1 tablet by mouth daily.  Patient not taking: Reported on 01/17/2020    [provider]  ciprofloxacin (CIPRO) 500 MG tablet Take 1 tablet (500 mg total) by mouth 2 (two) times daily. 01/17/20   Karamalegos,  Devonne Doughty, DO  hydrocortisone (ANUSOL-HC) 2.5 % rectal cream Place rectally 3 (three) times daily. Patient not taking: Reported on 01/17/2020 01/09/20   Loletha Grayer, MD  LORazepam (ATIVAN) 0.5 MG tablet Take 1 tablet (0.5 mg total) by mouth at bedtime. 10/25/19   Karamalegos, Devonne Doughty, DO  metroNIDAZOLE (FLAGYL) 500 MG tablet Take 1 tablet (500 mg total) by mouth 2 (two) times daily. 01/17/20   Karamalegos, Devonne Doughty, DO  Multiple Vitamin (MULTIVITAMIN) tablet Take 1 tablet by mouth daily. Patient not taking: Reported on 01/17/2020    [provider]  NIFEdipine (PROCARDIA XL/NIFEDICAL XL) 60 MG 24 hr tablet TAKE 1 TABLET EVERY EVENING Patient taking differently: Take 60 mg by mouth every evening. TAKE 1 TABLET EVERY EVENING 11/11/19   Karamalegos, Devonne Doughty, DO  pantoprazole (PROTONIX) 40 MG tablet Take 1 tablet (40 mg total) by mouth daily. 01/10/20   Loletha Grayer, MD  SYNTHROID 88 MCG tablet Take 1 tablet (88 mcg total) by mouth daily before breakfast. 09/15/19   Olin Hauser, DO  vitamin C (ASCORBIC ACID) 500 MG tablet Take 500 mg by mouth daily. Patient not taking: Reported on 01/17/2020    [provider]    Physical Exam: Vitals:   01/21/20 1940 01/21/20 1941 01/21/20 2000 01/21/20 2127  BP:  (!) 139/58 138/60 (!) 152/59  Pulse:  76 71 72  Resp:  18 (!) 24 19  Temp:  97.7 F (36.5 C)    TempSrc:  Oral    SpO2:  97% 99% 99%  Weight: 56.7 kg     Height: 5\' 5"  (1.651 m)      Constitutional: Resting in bed with head elevated, NAD, somewhat anxious but otherwise calm, comfortable Eyes: PERRL, lids and conjunctivae normal ENMT: Mucous membranes are dry. Posterior pharynx clear of any exudate or lesions.Normal dentition.  Neck: normal, supple, no masses. Respiratory: clear to auscultation bilaterally, no wheezing, no crackles. Normal respiratory effort. No accessory muscle use.  Cardiovascular: Regular rate and rhythm, no murmurs / rubs /  gallops. No extremity edema. 2+ pedal pulses. Abdomen: Mild LLQ tenderness to palpation, no masses palpated. No hepatosplenomegaly. Bowel sounds positive.  Musculoskeletal: no clubbing / cyanosis. No joint deformity upper and lower extremities. Good ROM, no contractures. Normal muscle tone.  Skin: no rashes, lesions, ulcers. No induration Neurologic: CN 2-12 grossly intact. Sensation intact, Strength 5/5 in all 4. Mild tremor of both hands. FTN intact. Psychiatric: Normal judgment and insight. Alert and oriented x 3. Somewhat anxious mood.   Labs on Admission: I have personally reviewed following labs and imaging studies  CBC: Recent Labs  Lab 01/21/20 2032  WBC 13.1*  NEUTROABS 9.7*  HGB 11.5*  HCT 32.2*  MCV 90.4  PLT 161*   Basic Metabolic Panel: Recent Labs  Lab 01/21/20 2032  NA 120*  K 3.8  CL 88*  CO2 19*  GLUCOSE 86  BUN 25*  CREATININE 2.00*  CALCIUM 8.6*   GFR: Estimated Creatinine Clearance: 20.4 mL/min (A) (by C-G formula based on SCr of 2 mg/dL (H)). Liver Function Tests: No results for input(s): AST, ALT, ALKPHOS, BILITOT, PROT, ALBUMIN in the last 168 hours. No results for input(s): LIPASE, AMYLASE in the last 168 hours. No results for input(s): AMMONIA in the last 168 hours. Coagulation Profile: No results for input(s): INR, PROTIME in the last 168 hours. Cardiac Enzymes: No results for input(s): CKTOTAL, CKMB, CKMBINDEX, TROPONINI in the last 168 hours. BNP (last 3 results) No results for input(s): PROBNP in the last 8760 hours. HbA1C: No results for input(s): HGBA1C in the last 72 hours. CBG: No results for input(s): GLUCAP in the last 168 hours. Lipid Profile: No results for input(s): CHOL, HDL, LDLCALC, TRIG, CHOLHDL, LDLDIRECT in the last 72 hours. Thyroid Function Tests: No results for input(s): TSH, T4TOTAL, FREET4, T3FREE, THYROIDAB in the last 72 hours. Anemia Panel: No results for input(s): VITAMINB12, FOLATE, FERRITIN, TIBC, IRON,  RETICCTPCT in the last 72 hours. Urine analysis:    Component Value Date/Time   COLORURINE STRAW (A) 01/07/2020 0520   APPEARANCEUR CLEAR (A) 01/07/2020 0520   LABSPEC 1.003 (L) 01/07/2020 0520   PHURINE 6.0 01/07/2020 0520   GLUCOSEU NEGATIVE 01/07/2020 0520   HGBUR SMALL (A) 01/07/2020 0520   HGBUR moderate 06/28/2007 1131   BILIRUBINUR NEGATIVE 01/07/2020 0520   BILIRUBINUR negative 02/03/2018 1148   KETONESUR 5 (A) 01/07/2020 0520   PROTEINUR NEGATIVE 01/07/2020 0520   UROBILINOGEN 0.2 02/03/2018 1148   UROBILINOGEN 0.2 09/29/2011 2001   NITRITE NEGATIVE 01/07/2020 0520   LEUKOCYTESUR NEGATIVE  01/07/2020 0520    Radiological Exams on Admission: No results found.  EKG: Personally reviewed. Normal sinus rhythm without acute ischemic changes.  Motion artifact.  Not significantly changed when compared to prior.  Assessment/Plan Principal Problem:   Hyponatremia Active Problems:   Postsurgical hypothyroidism   Benign hypertension with CKD (chronic kidney disease) stage III (HCC)   Centrilobular emphysema (HCC)   Acute kidney injury superimposed on CKD (HCC)  RAISA DITTO is a 79 y.o. female with medical history significant for COPD, CKD stage IIIb, hyperaldosteronism, hypertension, hyperlipidemia, acquired hypothyroidism after thyroidectomy for thyroid cancer, uterine leiomyosarcoma s/p hysterectomy and right middle lung lobectomy, presumed stage I non-small cell lung cancer of left upper lobe currently undergoing SBRT, and anemia of chronic kidney disease who is admitted with hyponatremia.  Hyponatremia: Suspect hypovolemic hyponatremia given poor oral intake. SIADH also possible. -S/p 1 L LR in the ED -Check UA, urine sodium and osmolality, serum osmolality -Start IV NS @75  mL/hour x10 hours and repeat labs in a.m. -Hold spironolactone for now  AKI on CKD stage IIIb: Baseline creatinine 1.4-1.5. Suspect hypovolemic related as well as medication effect. -Continue IV  fluid hydration as above -Holding spironolactone -Avoid NSAIDs -Repeat labs in a.m.  Recent admission for diverticulitis: Initially treated with IV Zosyn and transition to oral Augmentin on discharge. Had reported adverse reaction to Augmentin and switch to 1 week course of Cipro/Flagyl by PCP. Patient has improving symptoms but continued nausea and LLQ tenderness with palpation. She feels too nauseous to continue taking her oral antibiotics at this point. -Continue IV Cipro/Flagyl to complete 2 more days antibiotics -IV Zofran as needed -IV fluids overnight as above  Presumed stage I non-small cell lung cancer of left upper lobe: Undergoing SBRT with radiation oncology, Dr. Noreene Filbert. Last treatment 01/18/2020. She is not sure how well she is tolerating due to her other ongoing medical issues. -Follow with radiation oncology as outpatient  COPD/emphysema: Chronic and stable without acute exacerbation. -Continue albuterol as needed  Hyperaldosteronism/hypertension: Had previous work-up consistent with hyperaldosteronism. She has been on spironolactone 25 mg twice daily. Has also been on nifedipine, nebivolol, enalapril for blood pressure control. -Hold spironolactone in setting of AKI, considered reduced dose in the future -Now off enalapril due to renal function -Resume home nifedipine and nebivolol  Hyperlipidemia: Not currently on statin therapy.  Hypothyroidism: Continue home Synthroid. Check TSH.  Anemia of chronic kidney disease: Chronic and stable without obvious bleeding. Continue to monitor.  Anxiety: Continue home Ativan nightly as needed  DVT prophylaxis: Subcutaneous heparin Code Status: DNR, confirmed with patient Family Communication: Discussed with patient, she has discussed with family Disposition Plan: From home and likely discharge to home pending correction of electrolyte abnormalities and improving renal function. Consults called: None Admission  status:  Status is: Observation  The patient remains OBS appropriate and will d/c before 2 midnights.  Dispo: The patient is from: Home              Anticipated d/c is to: Home              Anticipated d/c date is: 1 day              Patient currently is not medically stable to d/c.  Zada Finders MD [facility]  If 7PM-7AM, please contact night-coverage [email or web address]  01/21/2020, 9:50 PM

## 2020-01-22 DIAGNOSIS — E871 Hypo-osmolality and hyponatremia: Secondary | ICD-10-CM | POA: Diagnosis not present

## 2020-01-22 LAB — BASIC METABOLIC PANEL
Anion gap: 8 (ref 5–15)
BUN: 21 mg/dL (ref 8–23)
CO2: 22 mmol/L (ref 22–32)
Calcium: 8.9 mg/dL (ref 8.9–10.3)
Chloride: 96 mmol/L — ABNORMAL LOW (ref 98–111)
Creatinine, Ser: 1.83 mg/dL — ABNORMAL HIGH (ref 0.44–1.00)
GFR, Estimated: 28 mL/min — ABNORMAL LOW (ref >60)
Glucose, Bld: 77 mg/dL (ref 70–99)
Potassium: 3.9 mmol/L (ref 3.5–5.1)
Sodium: 126 mmol/L — ABNORMAL LOW (ref 135–145)

## 2020-01-22 LAB — CBC
HCT: 33.2 % — ABNORMAL LOW (ref 36.0–46.0)
Hemoglobin: 12 g/dL (ref 12.0–15.0)
MCH: 32.6 pg (ref 26.0–34.0)
MCHC: 36.1 g/dL — ABNORMAL HIGH (ref 30.0–36.0)
MCV: 90.2 fL (ref 80.0–100.0)
Platelets: 456 10*3/uL — ABNORMAL HIGH (ref 150–400)
RBC: 3.68 MIL/uL — ABNORMAL LOW (ref 3.87–5.11)
RDW: 12.8 % (ref 11.5–15.5)
WBC: 9.6 10*3/uL (ref 4.0–10.5)
nRBC: 0 % (ref 0.0–0.2)

## 2020-01-22 MED ORDER — LORAZEPAM 2 MG/ML IJ SOLN
1.0000 mg | Freq: Once | INTRAMUSCULAR | Status: AC
  Administered 2020-01-22: 1 mg via INTRAVENOUS
  Filled 2020-01-22: qty 1

## 2020-01-22 MED ORDER — SPIRONOLACTONE 25 MG PO TABS: ORAL_TABLET | ORAL | Status: DC

## 2020-01-22 NOTE — Progress Notes (Signed)
Pt refusing to be placed back on heart monitor.

## 2020-01-22 NOTE — ED Notes (Signed)
Pt ambulated to restroom without difficulty

## 2020-01-22 NOTE — Progress Notes (Signed)
Discharge instructions and teaching provided to pt. Pt verbalized and demonstrated understanding of provided instructions. All outstanding questions resolved. L hand PIV removed. Cannula intact. Pt tolerated well. All belongings packed and in tow. Pt transported to private vehicle for discharge via wheelchair.

## 2020-01-22 NOTE — Discharge Summary (Signed)
Physician Discharge Summary   Margaret Black  female DOB: [birth year]  MCN:[id number]  PCP: Olin Hauser, DO  Admit date: 01/21/2020 Discharge date: 01/22/2020  Admitted From: home Disposition:  home CODE STATUS: DNR  Discharge Instructions    Discharge instructions   Complete by: As directed    Your sodium level was low at 120 on presentation, which improved to 126 with IV fluids overnight, which suggests that you were dehydrated.  You have a mild case of kidney injury, which is also likely due to dehydration.  Please be sure to keep yourself hydrated.  Please hold Aldactone until you follow up with your outpatient doctor.  Continue your home Cipro and Flagyl to finish the course of antibiotics for your recent diverticulitis.    Follow up with your primary care doctor next week to check your sodium level and kidney function.   Dr. Enzo Bi [facility] Course:  For full details, please see H&P, progress notes, consult notes and ancillary notes.  Briefly,  Margaret Black is a 79 y.o. female with medical history significant for COPD, CKD stage IIIb, hyperaldosteronism, hypertension, hyperlipidemia, acquired hypothyroidism after thyroidectomy for thyroid cancer, uterine leiomyosarcoma s/p hysterectomy and right middle lung lobectomy, presumed stage I non-small cell lung cancer of left upper lobe currently undergoing SBRT, and anemia of chronic kidney disease who presented to the ED for evaluation of dizziness, nausea and feeling dehydrated.  Patient was recently admitted from 01/07/2020-01/09/2020 for diverticulitis.  She was treated with IV Zosyn in hospital and discharged on Augmentin.  She had associated AKI on CKD and her home spironolactone and enalapril were held on discharge.  She had follow-up with her PCP on 10/26 and antibiotics were switched to Cipro and Flagyl for 7-day course due to possible allergic reaction to Augmentin.  She had resumed  spironolactone and enalapril at that time per documentation.  Hyponatremia On presentation, Na level was low at 120, which improved to 126 with IV fluids overnight.  Suspect hypovolemic hyponatremia given poor oral intake.  Urine and osm studies showed appropriate response.  Home spironolactone held until outpatient followup due to dehydration.  Prior to discharge, pt reported feeling much better and able to tolerate oral intake, and felt ready to go home.  AKI on CKD stage IIIb: Cr 2 on presentation.  Baseline creatinine 1.4-1.5. Suspect hypovolemic dehydration.  Cr improved with overnight IVF.  Home spironolactone held until outpatient followup due to dehydration.  Recent admission for diverticulitis: Initially treated with IV Zosyn and transition to oral Augmentin on discharge. Had reported adverse reaction to Augmentin and switch to 1 week course of Cipro/Flagyl by PCP.  Patient had improving symptoms but continued nausea and LLQ tenderness with palpation. Pt was continued on IV Cipro/Flagyl while inpatient and discharged to finish her Cipro/Flagyl.  Presumed stage I non-small cell lung cancer of left upper lobe: Undergoing SBRT with radiation oncology, Dr. Noreene Filbert. Last treatment 01/18/2020.  Follow with radiation oncology as outpatient  COPD/emphysema: Chronic and stable without acute exacerbation.  Continued albuterol as needed  Hyperaldosteronism hypertension: Had previous work-up consistent with hyperaldosteronism. She has been on spironolactone 25 mg twice daily.  Held spironolactone in setting of AKI.  Continued home nifedipine and nebivolol.  Hyperlipidemia: Not currently on statin therapy.  Hypothyroidism: Continued home Synthroid.   Anemia of chronic kidney disease: Chronic and stable without obvious bleeding.   Anxiety: Continued home Ativan nightly as needed   Discharge  Diagnoses:  Principal Problem:   Hyponatremia Active Problems:   Postsurgical  hypothyroidism   Benign hypertension with CKD (chronic kidney disease) stage III (HCC)   Anxiety   Centrilobular emphysema (HCC)   Acute kidney injury superimposed on CKD [facility])    Discharge Instructions:  Allergies as of 01/22/2020      Reactions   Lactase Diarrhea   Atenolol Rash   Latex Rash      Medication List    TAKE these medications   acidophilus Caps capsule 2 capsules daily (okay to substitute any probiotic)   Bystolic 5 MG tablet Generic drug: nebivolol Take 1 tablet (5 mg total) by mouth daily.   CALCIUM-VITAMIN D PO Take 1 tablet by mouth daily.   ciprofloxacin 500 MG tablet Commonly known as: Cipro Take 1 tablet (500 mg total) by mouth 2 (two) times daily.   hydrocortisone 2.5 % rectal cream Commonly known as: ANUSOL-HC Place rectally 3 (three) times daily.   lansoprazole 15 MG capsule Commonly known as: PREVACID Take 15 mg by mouth daily.   LORazepam 0.5 MG tablet Commonly known as: ATIVAN Take 1 tablet (0.5 mg total) by mouth at bedtime.   metroNIDAZOLE 500 MG tablet Commonly known as: Flagyl Take 1 tablet (500 mg total) by mouth 2 (two) times daily.   multivitamin tablet Take 1 tablet by mouth daily.   NIFEdipine 60 MG 24 hr tablet Commonly known as: PROCARDIA XL/NIFEDICAL XL TAKE 1 TABLET EVERY EVENING What changed: additional instructions   pantoprazole 40 MG tablet Commonly known as: PROTONIX Take 1 tablet (40 mg total) by mouth daily.   spironolactone 25 MG tablet Commonly known as: ALDACTONE Hold this medication until followup with your outpatient doctor due to dehydration. What changed:   how much to take  how to take this  when to take this  additional instructions   Synthroid 88 MCG tablet Generic drug: levothyroxine Take 1 tablet (88 mcg total) by mouth daily before breakfast.   vitamin C 500 MG tablet Commonly known as: ASCORBIC ACID Take 500 mg by mouth daily.        Follow-up Information    Olin Hauser, DO. Schedule an appointment as soon as possible for a visit in 1 week(s).   Specialty: Family Medicine Why: check sodium level and kidney function. Contact information: [place] [address] [phone]               Allergies  Allergen Reactions  . Lactase Diarrhea  . Atenolol Rash  . Latex Rash     The results of significant diagnostics from this hospitalization (including imaging, microbiology, ancillary and laboratory) are listed below for reference.   Consultations:   Procedures/Studies: CT ABDOMEN PELVIS WO CONTRAST  Result Date: 01/07/2020 CLINICAL DATA:  Abdominal pain EXAM: CT ABDOMEN AND PELVIS WITHOUT CONTRAST TECHNIQUE: Multidetector CT imaging of the abdomen and pelvis was performed following the standard protocol without IV contrast. COMPARISON:  12/30/2008 FINDINGS: LOWER CHEST: There is left basilar atelectasis. Unchanged 12 mm nodule at the left lung base (series 3, image 12). HEPATOBILIARY: Normal hepatic contours. No intra- or extrahepatic biliary dilatation. Status post cholecystectomy. PANCREAS: Normal pancreas. No ductal dilatation or peripancreatic fluid collection. SPLEEN: Normal. ADRENALS/URINARY TRACT: The adrenal glands are normal. No hydronephrosis, nephroureterolithiasis or solid renal mass. The urinary bladder is normal for degree of distention STOMACH/BOWEL: There is no hiatal hernia. Normal duodenal course and caliber. No small bowel dilatation or inflammation. Sigmoid diverticulosis with mild surrounding  inflammatory stranding. No free intraperitoneal air. Normal appendix. VASCULAR/LYMPHATIC: Normal course and caliber of the major abdominal vessels. No abdominal or pelvic lymphadenopathy. REPRODUCTIVE: Status post hysterectomy MUSCULOSKELETAL. No bony spinal canal stenosis or focal osseous abnormality. OTHER: None. IMPRESSION: 1. Mild acute sigmoid diverticulitis without abscess or free intraperitoneal air. 2. Unchanged 12 mm  left lower lobe pulmonary nodule. Electronically Signed   By: Ulyses Jarred M.D.   On: 01/07/2020 05:23      Labs: BNP (last 3 results) No results for input(s): BNP in the last 8760 hours. Basic Metabolic Panel: Recent Labs  Lab 01/21/20 2032 01/22/20 0401  NA 120* 126*  K 3.8 3.9  CL 88* 96*  CO2 19* 22  GLUCOSE 86 77  BUN 25* 21  CREATININE 2.00* 1.83*  CALCIUM 8.6* 8.9  MG 1.7  --    Liver Function Tests: No results for input(s): AST, ALT, ALKPHOS, BILITOT, PROT, ALBUMIN in the last 168 hours. No results for input(s): LIPASE, AMYLASE in the last 168 hours. No results for input(s): AMMONIA in the last 168 hours. CBC: Recent Labs  Lab 01/21/20 2032 01/22/20 0401  WBC 13.1* 9.6  NEUTROABS 9.7*  --   HGB 11.5* 12.0  HCT 32.2* 33.2*  MCV 90.4 90.2  PLT 453* 456*   Cardiac Enzymes: No results for input(s): CKTOTAL, CKMB, CKMBINDEX, TROPONINI in the last 168 hours. BNP: Invalid input(s): POCBNP CBG: No results for input(s): GLUCAP in the last 168 hours. D-Dimer No results for input(s): DDIMER in the last 72 hours. Hgb A1c No results for input(s): HGBA1C in the last 72 hours. Lipid Profile No results for input(s): CHOL, HDL, LDLCALC, TRIG, CHOLHDL, LDLDIRECT in the last 72 hours. Thyroid function studies Recent Labs    01/21/20 2032  TSH 7.027*   Anemia work up No results for input(s): VITAMINB12, FOLATE, FERRITIN, TIBC, IRON, RETICCTPCT in the last 72 hours. Urinalysis    Component Value Date/Time   COLORURINE STRAW (A) 01/21/2020 2127   APPEARANCEUR CLEAR (A) 01/21/2020 2127   LABSPEC 1.001 (L) 01/21/2020 2127   PHURINE 5.0 01/21/2020 2127   GLUCOSEU NEGATIVE 01/21/2020 2127   HGBUR NEGATIVE 01/21/2020 2127   HGBUR moderate 06/28/2007 1131   BILIRUBINUR NEGATIVE 01/21/2020 2127   BILIRUBINUR negative 02/03/2018 [place] 01/21/2020 2127   PROTEINUR NEGATIVE 01/21/2020 2127   UROBILINOGEN 0.2 02/03/2018 1148   UROBILINOGEN 0.2  09/29/2011 2001   NITRITE NEGATIVE 01/21/2020 2127   LEUKOCYTESUR NEGATIVE 01/21/2020 2127   Sepsis Labs Invalid input(s): PROCALCITONIN,  WBC,  LACTICIDVEN Microbiology Recent Results (from the past 240 hour(s))  Respiratory Panel by RT PCR (Flu A&B, Covid) - Nasopharyngeal Swab     Status: None   Collection Time: 01/21/20  9:27 PM   Specimen: Nasopharyngeal Swab  Result Value Ref Range Status   SARS Coronavirus 2 by RT PCR NEGATIVE NEGATIVE Final    Comment: (NOTE) SARS-CoV-2 target nucleic acids are NOT DETECTED.  The SARS-CoV-2 RNA is generally detectable in upper respiratoy specimens during the acute phase of infection. The lowest concentration of SARS-CoV-2 viral copies this assay can detect is 131 copies/mL. A negative result does not preclude SARS-Cov-2 infection and should not be used as the sole basis for treatment or other patient management decisions. A negative result may occur with  improper specimen collection/handling, submission of specimen other than nasopharyngeal swab, presence of viral mutation(s) within the areas targeted by this assay, and inadequate number of viral copies (<131 copies/mL). A negative result must  be combined with clinical observations, patient history, and epidemiological information. The expected result is Negative.  Fact Sheet for Patients:  PinkCheek.be  Fact Sheet for Healthcare Providers:  GravelBags.it  This test is no t yet approved or cleared by the [place] FDA and  has been authorized for detection and/or diagnosis of SARS-CoV-2 by FDA under an Emergency Use Authorization (EUA). This EUA will remain  in effect (meaning this test can be used) for the duration of the COVID-19 declaration under Section 564(b)(1) of the Act, 21 U.S.C. section 360bbb-3(b)(1), unless the authorization is terminated or revoked sooner.     Influenza A by PCR NEGATIVE NEGATIVE Final    Influenza B by PCR NEGATIVE NEGATIVE Final    Comment: (NOTE) The Xpert Xpress SARS-CoV-2/FLU/RSV assay is intended as an aid in  the diagnosis of influenza from Nasopharyngeal swab specimens and  should not be used as a sole basis for treatment. Nasal washings and  aspirates are unacceptable for Xpert Xpress SARS-CoV-2/FLU/RSV  testing.  Fact Sheet for Patients: PinkCheek.be  Fact Sheet for Healthcare Providers: GravelBags.it  This test is not yet approved or cleared by the [place] FDA and  has been authorized for detection and/or diagnosis of SARS-CoV-2 by  FDA under an Emergency Use Authorization (EUA). This EUA will remain  in effect (meaning this test can be used) for the duration of the  Covid-19 declaration under Section 564(b)(1) of the Act, 21  U.S.C. section 360bbb-3(b)(1), unless the authorization is  terminated or revoked. Performed at [facility], [address] [place], [place] [address]      Total time spend on discharging this patient, including the last patient exam, discussing the hospital stay, instructions for ongoing care as it relates to all pertinent caregivers, as well as preparing the medical discharge records, prescriptions, and/or referrals as applicable, is 40 minutes.    Enzo Bi, MD  [facility] 01/22/2020, 9:54 AM  If 7PM-7AM, please contact night-coverage

## 2020-01-23 ENCOUNTER — Ambulatory Visit
Admission: RE | Admit: 2020-01-23 | Discharge: 2020-01-23 | Disposition: A | Discharge status: Home / Self Care | Payer: Medicare Other | Source: Ambulatory Visit | Attending: Radiation Oncology | Admitting: Radiation Oncology

## 2020-01-23 DIAGNOSIS — Z510 Encounter for antineoplastic radiation therapy: Secondary | ICD-10-CM | POA: Diagnosis not present

## 2020-01-23 DIAGNOSIS — C3412 Malignant neoplasm of upper lobe, left bronchus or lung: Secondary | ICD-10-CM | POA: Diagnosis not present

## 2020-01-25 ENCOUNTER — Ambulatory Visit
Admission: RE | Admit: 2020-01-25 | Discharge: 2020-01-25 | Disposition: A | Discharge status: Home / Self Care | Payer: Medicare Other | Source: Ambulatory Visit | Attending: Radiation Oncology | Admitting: Radiation Oncology

## 2020-01-25 DIAGNOSIS — Z510 Encounter for antineoplastic radiation therapy: Secondary | ICD-10-CM | POA: Diagnosis not present

## 2020-01-26 DIAGNOSIS — K219 Gastro-esophageal reflux disease without esophagitis: Secondary | ICD-10-CM

## 2020-01-27 MED ORDER — LANSOPRAZOLE 15 MG PO CPDR: 15.0000 mg | DELAYED_RELEASE_CAPSULE | Freq: Every day | ORAL | 3 refills | Status: AC

## 2020-01-30 ENCOUNTER — Ambulatory Visit: Payer: Medicare Other

## 2020-01-30 ENCOUNTER — Telehealth: Payer: Self-pay | Admitting: *Deleted

## 2020-01-30 ENCOUNTER — Telehealth (INDEPENDENT_AMBULATORY_CARE_PROVIDER_SITE_OTHER): Payer: Medicare Other | Admitting: Family Medicine

## 2020-01-30 ENCOUNTER — Telehealth: Payer: Self-pay

## 2020-01-30 ENCOUNTER — Other Ambulatory Visit: Payer: Self-pay

## 2020-01-30 ENCOUNTER — Encounter: Payer: Self-pay | Admitting: Family Medicine

## 2020-01-30 DIAGNOSIS — K219 Gastro-esophageal reflux disease without esophagitis: Secondary | ICD-10-CM | POA: Diagnosis not present

## 2020-01-30 DIAGNOSIS — R110 Nausea: Secondary | ICD-10-CM | POA: Diagnosis not present

## 2020-01-30 DIAGNOSIS — K5792 Diverticulitis of intestine, part unspecified, without perforation or abscess without bleeding: Secondary | ICD-10-CM | POA: Diagnosis not present

## 2020-01-30 MED ORDER — SUCRALFATE 1 G PO TABS: 1.0000 g | ORAL_TABLET | Freq: Three times a day (TID) | ORAL | 1 refills | Status: DC

## 2020-01-30 MED ORDER — ONDANSETRON 4 MG PO TBDP: 4.0000 mg | ORAL_TABLET | Freq: Three times a day (TID) | ORAL | 2 refills | Status: AC | PRN

## 2020-01-30 NOTE — Patient Instructions (Addendum)
Thank you for coming to the office today.   Carafate (Sucralfate) as needed up to 4 times daily (3 meals and bedtime)  to coat stomach lining to ease symptoms and help digestion pain.  Use nausea medicine Zofran as needed for nausea to slow down symptoms  May use Pepto as needed  Continue Lansoprazole  Follow-up with GI referral as ordered. Stay tuned for apt.  Please schedule a Follow-up Appointment to: Return if symptoms worsen or fail to improve.  If you have any other questions or concerns, please feel free to call the office or send a message through [facility]. You may also schedule an earlier appointment if necessary.  Additionally, you may be receiving a survey about your experience at our office within a few days to 1 week by e-mail or mail. We value your feedback.  [name], DO [place]

## 2020-01-30 NOTE — Telephone Encounter (Signed)
Copied from [place] [id number]. Topic: Appointment Scheduling - Scheduling Inquiry for Clinic >> Jan 30, 2020  7:06 AM Lennox Solders wrote: Reason for CRM:pt seen dr Raliegh Ip on 01-17-2020 and pt went to er on 01-21-2020. Pt believe she is having a flare up of diverticulitis. Pt is requesting a virtual visit today. Pt does not have the energy to come in. Pt is schedule for her last radiation treatment today

## 2020-01-30 NOTE — Telephone Encounter (Signed)
Patient advised as per Dr Raliegh Ip appointment scheduled for today.

## 2020-01-30 NOTE — Telephone Encounter (Signed)
Patient called reporting that she has been sick for 2 weeks and that she felt worse over the weekend and has not slept and wants to know what alternatives she has if she does not come to her 1130 radiation therapy appointment today (this is to be her final appointment) I have called radiation therapy and spoken with Elmyra Ricks who will have the other machine tech call patient to reschedule appointment.

## 2020-01-30 NOTE — Telephone Encounter (Signed)
Patient needs apt, currently no appointments right now unfortunately.  Best I can offer is to double book our 4pm patient tomorrow and put her on as a Virtual Telephone visit around 4pm tomorrow Tues 11/9, if we can get to her sooner we will call sooner if cancellation.  She will likely need treatment for diverticulitis and referral to GI.  If severe symptoms or worsening or not improving and needs more immediate attention can seek care at [facility] ED or Urgent care.  [name], DO [facility] 01/30/2020, 12:22 PM

## 2020-01-30 NOTE — Progress Notes (Signed)
Virtual Visit via Telephone The purpose of this virtual visit is to provide medical care while limiting exposure to the novel coronavirus (COVID19) for both patient and office staff.  Consent was obtained for phone visit:  Yes.   Answered questions that patient had about telehealth interaction:  Yes.   I discussed the limitations, risks, security and privacy concerns of performing an evaluation and management service by telephone. I also discussed with the patient that there may be a patient responsible charge related to this service. The patient expressed understanding and agreed to proceed.  Patient Location: Home Provider Location: [facility])  ---------------------------------------------------------------------- Chief Complaint  Patient presents with  . Diverticulitis    like refill for Zofran--onset 3 days --she feels very weak so did not go to radiation today --referral to GI   Patient presents for a same day appointment.   S: Reviewed CMA documentation. I have called patient and gathered additional HPI as follows:  Diverticulitis / abdominal pain / nausea  Recent course Hospital 01/07/20 to 01/09/20, see last note hosp follow up, treated for diverticulitis. Symptoms only temporarily resolved, then she was treated with full course Cipro / Flagyl at that time instead of Augmentin monotherapy. She had AKI but this was resolved on repeat. Later she went back to ED on 01/21/20 for hyponatremia and dehydration and required IVF. - She has seen Nephrology Dr Holley Raring on 01/26/20, with CKD-IIIb, mostly recovered, with repeat labs. - She was continued on Lansoprazole instead of protonix recently when rx was due  Interval now she has had worsening episode recently, called today for urgent appointment.  Tried Ensure to try to boost nutrition but it worsened her digestion - and caused intense "burning" pains Tried Gas-X limited relief  In past Zofran rx helped nausea,  now needs refill  She has seen Dr Marius Ditch [facility] in past 2018 for IBS-Constipation.  Past failed Dicyclomine due to dizziness.  Taking Pepto OTC temporary relief  Today now has some relief and improved symptoms. She is worried about having to go back to hospital and treat diverticulitis or antibiotics again. She is interested in GI referral now.  Denies any fevers, chills, sweats, body ache, cough, shortness of breath, sinus pain or pressure, headache, diarrhea, dark stool or blood in stool  Past Medical History:  Diagnosis Date  . Anxiety    Related to HTN and vision loss  . Central retinal vein occlusion of left eye    legally blind in Left eye  . 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 Collagen vascular disease ([place])    uncharacterized.  has seen rheum.  positive RF, positive serologies for Sjogrens.  C-ANCA/P-ANCA neg, ANA  neg, anti-dsDNA neg, anti-SCL70 neg, anti-centromere Ab neg  . COPD (chronic obstructive pulmonary disease) ([place])    quit smoking 1993  . Diverticulosis   . Dysfunction of eustachian tube   . Fatigue   . GERD (gastroesophageal reflux disease)   . Goiter, unspecified   . HLD (hyperlipidemia)   . HTN (hypertension)    difficult to control, hypertensive urgency hospitalizations, normal urine/plasma metanephrines and catecholamines, renal dopplers without stenosis  . Hyperaldosteronism    serum aldo/renin ration elevated (aldo 23.7, renin <0.15), CT abd 2010 with normal adrenals, dedicated adrenal CT without adrenal adenoma,   . Hypothyroid   . Insomnia    Related to HTN and vision loss  . Irritable bowel syndrome   . Leiomyosarcoma of uterus (  [place]) 09/22/2014  . Lung cancer [facility])    s/p R lobectomy 2001  . Lupus ([place])   . Osteopenia   . Pulmonary hypertension ([place])    echo [facility] 12/2008 with EF >5%, grade II diastolic dysfunction, RSVP >75mmHg, ACE level normal.  f/u echo at [place] - EF 55-60%, mild MR, mild LAE, PASP  25 + RA (no pulm HTN)  . Thyroid cancer ([place]) 2014  . Urge incontinence   . Uterine cancer [facility])    s/p hysterectomy 2001   Social History   Tobacco Use  . Smoking status: Former Smoker    Types: Cigarettes    Quit date: 07/04/1991    Years since quitting: 28.5  . Smokeless tobacco: Former Systems developer  . Tobacco comment: Quit in early 1980's.  Vaping Use  . Vaping Use: Never used  Substance Use Topics  . Alcohol use: No  . Drug use: No    Current Outpatient Medications:  .  acidophilus (RISAQUAD) CAPS capsule, 2 capsules daily (okay to substitute any probiotic), Disp: 60 capsule, Rfl: 0 .  BYSTOLIC 5 MG tablet, Take 1 tablet (5 mg total) by mouth daily., Disp: 90 tablet, Rfl: 3 .  CALCIUM-VITAMIN D PO, Take 1 tablet by mouth daily. , Disp: , Rfl:  .  hydrocortisone (ANUSOL-HC) 2.5 % rectal cream, Place rectally 3 (three) times daily., Disp: 30 g, Rfl: 0 .  lansoprazole (PREVACID) 15 MG capsule, Take 1 capsule (15 mg total) by mouth daily before breakfast., Disp: 90 capsule, Rfl: 3 .  LORazepam (ATIVAN) 0.5 MG tablet, Take 1 tablet (0.5 mg total) by mouth at bedtime., Disp: 30 tablet, Rfl: 5 .  Multiple Vitamin (MULTIVITAMIN) tablet, Take 1 tablet by mouth daily. , Disp: , Rfl:  .  NIFEdipine (PROCARDIA XL/NIFEDICAL XL) 60 MG 24 hr tablet, TAKE 1 TABLET EVERY EVENING (Patient taking differently: Take 60 mg by mouth every evening. TAKE 1 TABLET EVERY EVENING), Disp: 90 tablet, Rfl: 0 .  spironolactone (ALDACTONE) 25 MG tablet, Hold this medication until followup with your outpatient doctor due to dehydration., Disp: , Rfl:  .  SYNTHROID 88 MCG tablet, Take 1 tablet (88 mcg total) by mouth daily before breakfast., Disp: 90 tablet, Rfl: 3 .  vitamin C (ASCORBIC ACID) 500 MG tablet, Take 500 mg by mouth daily. , Disp: , Rfl:  .  ondansetron (ZOFRAN ODT) 4 MG disintegrating tablet, Take 1 tablet (4 mg total) by mouth every 8 (eight) hours as needed for nausea or vomiting., Disp: 30 tablet, Rfl:  2 .  sucralfate (CARAFATE) 1 g tablet, Take 1 tablet (1 g total) by mouth 4 (four) times daily -  with meals and at bedtime., Disp: 40 tablet, Rfl: 1  Depression screen [facility] 2/9 08/23/2019 01/06/2019 07/09/2018  Decreased Interest 0 0 0  Down, Depressed, Hopeless 0 1 0  PHQ - 2 Score 0 1 0  Altered sleeping - - 0  Tired, decreased energy - - 0  Change in appetite - - 0  Feeling bad or failure about yourself  - - 0  Trouble concentrating - - 0  Moving slowly or fidgety/restless - - 0  Suicidal thoughts - - 0  PHQ-9 Score - - 0  Difficult doing work/chores - - Not difficult at all  Some recent data might be hidden    GAD 7 : Generalized Anxiety Score 07/09/2018 07/01/2016  Nervous, Anxious, on Edge 1 0  Control/stop worrying 0 1  Worry too much - different things 0 0  Trouble relaxing 0 0  Restless 0 0  Easily annoyed or irritable 0 1  Afraid - awful might happen 0 0  Total GAD 7 Score 1 2  Anxiety Difficulty - Not difficult at all    -------------------------------------------------------------------------- O: No physical exam performed due to remote telephone encounter.  Lab results reviewed.  Recent Results (from the past 2160 hour(s))  Glucose, capillary     Status: Abnormal   Collection Time: 12/06/19  9:08 AM  Result Value Ref Range   Glucose-Capillary 107 (H) 70 - 99 mg/dL    Comment: Glucose reference range applies only to samples taken after fasting for at least 8 hours.  Lipase, blood     Status: None   Collection Time: 01/06/20 11:12 PM  Result Value Ref Range   Lipase 29 11 - 51 U/L    Comment: Performed at [facility], [address] [place], [place] [address]  Comprehensive metabolic panel     Status: Abnormal   Collection Time: 01/06/20 11:12 PM  Result Value Ref Range   Sodium 125 (L) 135 - 145 mmol/L   Potassium 4.4 3.5 - 5.1 mmol/L   Chloride 91 (L) 98 - 111 mmol/L   CO2 20 (L) 22 - 32 mmol/L   Glucose, Bld 123 (H) 70 - 99 mg/dL    Comment:  Glucose reference range applies only to samples taken after fasting for at least 8 hours.   BUN 33 (H) 8 - 23 mg/dL   Creatinine, Ser 1.81 (H) 0.44 - 1.00 mg/dL   Calcium 9.2 8.9 - 10.3 mg/dL   Total Protein 7.2 6.5 - 8.1 g/dL   Albumin 3.8 3.5 - 5.0 g/dL   AST 25 15 - 41 U/L   ALT 19 0 - 44 U/L   Alkaline Phosphatase 73 38 - 126 U/L   Total Bilirubin 1.4 (H) 0.3 - 1.2 mg/dL   GFR, Estimated 26 (L) >60 mL/min   Anion gap 14 5 - 15    Comment: Performed at [facility], [address] [place], [place] [address]  CBC     Status: Abnormal   Collection Time: 01/06/20 11:12 PM  Result Value Ref Range   WBC 9.4 4.0 - 10.5 K/uL   RBC 3.61 (L) 3.87 - 5.11 MIL/uL   Hemoglobin 11.7 (L) 12.0 - 15.0 g/dL   HCT 32.5 (L) 36 - 46 %   MCV 90.0 80.0 - 100.0 fL   MCH 32.4 26.0 - 34.0 pg   MCHC 36.0 30.0 - 36.0 g/dL   RDW 12.5 11.5 - 15.5 %   Platelets 295 150 - 400 K/uL   nRBC 0.0 0.0 - 0.2 %    Comment: Performed at [facility], [address] [place], [place] [address]  Respiratory Panel by RT PCR (Flu A&B, Covid) - Nasopharyngeal Swab     Status: None   Collection Time: 01/07/20  5:05 AM   Specimen: Nasopharyngeal Swab  Result Value Ref Range   SARS Coronavirus 2 by RT PCR NEGATIVE NEGATIVE    Comment: (NOTE) SARS-CoV-2 target nucleic acids are NOT DETECTED.  The SARS-CoV-2 RNA is generally detectable in upper respiratoy specimens during the acute phase of infection. The lowest concentration of SARS-CoV-2 viral copies this assay can detect is 131 copies/mL. A negative result does not preclude SARS-Cov-2 infection and should not be used as the sole basis for treatment or other patient management decisions. A negative result may occur with  improper specimen collection/handling, submission  of specimen other than nasopharyngeal swab, presence of viral mutation(s) within the areas targeted by this assay, and inadequate number of viral copies (<131 copies/mL). A  negative result must be combined with clinical observations, patient history, and epidemiological information. The expected result is Negative.  Fact Sheet for Patients:  PinkCheek.be  Fact Sheet for Healthcare Providers:  GravelBags.it  This test is no t yet approved or cleared by the [place] FDA and  has been authorized for detection and/or diagnosis of SARS-CoV-2 by FDA under an Emergency Use Authorization (EUA). This EUA will remain  in effect (meaning this test can be used) for the duration of the COVID-19 declaration under Section 564(b)(1) of the Act, 21 U.S.C. section 360bbb-3(b)(1), unless the authorization is terminated or revoked sooner.     Influenza A by PCR NEGATIVE NEGATIVE   Influenza B by PCR NEGATIVE NEGATIVE    Comment: (NOTE) The Xpert Xpress SARS-CoV-2/FLU/RSV assay is intended as an aid in  the diagnosis of influenza from Nasopharyngeal swab specimens and  should not be used as a sole basis for treatment. Nasal washings and  aspirates are unacceptable for Xpert Xpress SARS-CoV-2/FLU/RSV  testing.  Fact Sheet for Patients: PinkCheek.be  Fact Sheet for Healthcare Providers: GravelBags.it  This test is not yet approved or cleared by the [place] FDA and  has been authorized for detection and/or diagnosis of SARS-CoV-2 by  FDA under an Emergency Use Authorization (EUA). This EUA will remain  in effect (meaning this test can be used) for the duration of the  Covid-19 declaration under Section 564(b)(1) of the Act, 21  U.S.C. section 360bbb-3(b)(1), unless the authorization is  terminated or revoked. Performed at [facility], [address] [place], [place] [address]   Urinalysis, Complete w Microscopic     Status: Abnormal   Collection Time: 01/07/20  5:20 AM  Result Value Ref Range   Color, Urine STRAW (A) YELLOW    APPearance CLEAR (A) CLEAR   Specific Gravity, Urine 1.003 (L) 1.005 - 1.030   pH 6.0 5.0 - 8.0   Glucose, UA NEGATIVE NEGATIVE mg/dL   Hgb urine dipstick SMALL (A) NEGATIVE   Bilirubin Urine NEGATIVE NEGATIVE   Ketones, ur 5 (A) NEGATIVE mg/dL   Protein, ur NEGATIVE NEGATIVE mg/dL   Nitrite NEGATIVE NEGATIVE   Leukocytes,Ua NEGATIVE NEGATIVE   RBC / HPF 0-5 0 - 5 RBC/hpf   WBC, UA 0-5 0 - 5 WBC/hpf   Bacteria, UA NONE SEEN NONE SEEN   Squamous Epithelial / LPF 0-5 0 - 5   Mucus PRESENT     Comment: Performed at [facility], [place], [place] [address]  Troponin I (High Sensitivity)     Status: None   Collection Time: 01/07/20  8:14 AM  Result Value Ref Range   Troponin I (High Sensitivity) 13 <18 ng/L    Comment: (NOTE) Elevated high sensitivity troponin I (hsTnI) values and significant  changes across serial measurements may suggest ACS but many other  chronic and acute conditions are known to elevate hsTnI results.  Refer to the "Links" section for chest pain algorithms and additional  guidance. Performed at [facility], [place], [place] [address]   Troponin I (High Sensitivity)     Status: None   Collection Time: 01/07/20 11:38 AM  Result Value Ref Range   Troponin I (High Sensitivity) 13 <18 ng/L    Comment: (NOTE) Elevated high sensitivity troponin I (hsTnI) values and  significant  changes across serial measurements may suggest ACS but many other  chronic and acute conditions are known to elevate hsTnI results.  Refer to the "Links" section for chest pain algorithms and additional  guidance. Performed at [facility], [address] [place], [place] [address]   Basic metabolic panel     Status: Abnormal   Collection Time: 01/08/20  5:12 AM  Result Value Ref Range   Sodium 141 135 - 145 mmol/L   Potassium 4.1 3.5 - 5.1 mmol/L   Chloride 109 98 - 111 mmol/L   CO2 22 22 - 32 mmol/L   Glucose, Bld 68  (L) 70 - 99 mg/dL    Comment: Glucose reference range applies only to samples taken after fasting for at least 8 hours.   BUN 21 8 - 23 mg/dL   Creatinine, Ser 1.74 (H) 0.44 - 1.00 mg/dL   Calcium 8.3 (L) 8.9 - 10.3 mg/dL   GFR, Estimated 27 (L) >60 mL/min   Anion gap 10 5 - 15    Comment: Performed at [facility], [address] [place], [place] [address]  CBC     Status: Abnormal   Collection Time: 01/08/20  5:12 AM  Result Value Ref Range   WBC 7.9 4.0 - 10.5 K/uL   RBC 3.00 (L) 3.87 - 5.11 MIL/uL   Hemoglobin 9.3 (L) 12.0 - 15.0 g/dL   HCT 28.7 (L) 36 - 46 %   MCV 95.7 80.0 - 100.0 fL   MCH 31.0 26.0 - 34.0 pg   MCHC 32.4 30.0 - 36.0 g/dL   RDW 13.4 11.5 - 15.5 %   Platelets 227 150 - 400 K/uL   nRBC 0.0 0.0 - 0.2 %    Comment: Performed at [facility], [address] [place], [place] [address]  Hemoglobin     Status: Abnormal   Collection Time: 01/08/20  1:38 PM  Result Value Ref Range   Hemoglobin 10.7 (L) 12.0 - 15.0 g/dL    Comment: Performed at [facility], [address] [place], [place] [address]  CBC     Status: Abnormal   Collection Time: 01/09/20  5:11 AM  Result Value Ref Range   WBC 6.7 4.0 - 10.5 K/uL   RBC 3.04 (L) 3.87 - 5.11 MIL/uL   Hemoglobin 10.0 (L) 12.0 - 15.0 g/dL   HCT 28.9 (L) 36 - 46 %   MCV 95.1 80.0 - 100.0 fL   MCH 32.9 26.0 - 34.0 pg   MCHC 34.6 30.0 - 36.0 g/dL   RDW 13.3 11.5 - 15.5 %   Platelets 238 150 - 400 K/uL   nRBC 0.0 0.0 - 0.2 %    Comment: Performed at [facility], [address] [place], [place] [address]  Basic metabolic panel     Status: Abnormal   Collection Time: 01/09/20  5:11 AM  Result Value Ref Range   Sodium 139 135 - 145 mmol/L   Potassium 3.7 3.5 - 5.1 mmol/L   Chloride 107 98 - 111 mmol/L   CO2 23 22 - 32 mmol/L   Glucose, Bld 90 70 - 99 mg/dL    Comment: Glucose reference range applies only to samples taken after fasting for at least 8 hours.   BUN 17 8 - 23  mg/dL   Creatinine, Ser 1.59 (H) 0.44 - 1.00 mg/dL   Calcium 8.5 (L) 8.9 - 10.3 mg/dL   GFR, Estimated 31 (L) >60 mL/min  Anion gap 9 5 - 15    Comment: Performed at [facility], [address] [place], [place] [address]  CBC with Differential     Status: Abnormal   Collection Time: 01/21/20  8:32 PM  Result Value Ref Range   WBC 13.1 (H) 4.0 - 10.5 K/uL   RBC 3.56 (L) 3.87 - 5.11 MIL/uL   Hemoglobin 11.5 (L) 12.0 - 15.0 g/dL   HCT 32.2 (L) 36 - 46 %   MCV 90.4 80.0 - 100.0 fL   MCH 32.3 26.0 - 34.0 pg   MCHC 35.7 30.0 - 36.0 g/dL   RDW 12.8 11.5 - 15.5 %   Platelets 453 (H) 150 - 400 K/uL   nRBC 0.0 0.0 - 0.2 %   Neutrophils Relative % 73 %   Neutro Abs 9.7 (H) 1.7 - 7.7 K/uL   Lymphocytes Relative 18 %   Lymphs Abs 2.3 0.7 - 4.0 K/uL   Monocytes Relative 6 %   Monocytes Absolute 0.8 0.1 - 1.0 K/uL   Eosinophils Relative 1 %   Eosinophils Absolute 0.2 0.0 - 0.5 K/uL   Basophils Relative 1 %   Basophils Absolute 0.1 0.0 - 0.1 K/uL   Immature Granulocytes 1 %   Abs Immature Granulocytes 0.08 (H) 0.00 - 0.07 K/uL    Comment: Performed at [facility], [address], [place], [place] [address]  Basic metabolic panel     Status: Abnormal   Collection Time: 01/21/20  8:32 PM  Result Value Ref Range   Sodium 120 (L) 135 - 145 mmol/L   Potassium 3.8 3.5 - 5.1 mmol/L   Chloride 88 (L) 98 - 111 mmol/L   CO2 19 (L) 22 - 32 mmol/L   Glucose, Bld 86 70 - 99 mg/dL    Comment: Glucose reference range applies only to samples taken after fasting for at least 8 hours.   BUN 25 (H) 8 - 23 mg/dL   Creatinine, Ser 2.00 (H) 0.44 - 1.00 mg/dL   Calcium 8.6 (L) 8.9 - 10.3 mg/dL   GFR, Estimated 25 (L) >60 mL/min    Comment: (NOTE) Calculated using the CKD-EPI Creatinine Equation (2021)    Anion gap 13 5 - 15    Comment: Performed at [facility], [place], [place] [address]  Troponin I (High Sensitivity)     Status: None   Collection Time:  01/21/20  8:32 PM  Result Value Ref Range   Troponin I (High Sensitivity) 8 <18 ng/L    Comment: (NOTE) Elevated high sensitivity troponin I (hsTnI) values and significant  changes across serial measurements may suggest ACS but many other  chronic and acute conditions are known to elevate hsTnI results.  Refer to the "Links" section for chest pain algorithms and additional  guidance. Performed at [facility], [address] [place], [place] [address]   TSH     Status: Abnormal   Collection Time: 01/21/20  8:32 PM  Result Value Ref Range   TSH 7.027 (H) 0.350 - 4.500 uIU/mL    Comment: Performed by a 3rd Generation assay with a functional sensitivity of <=0.01 uIU/mL. Performed at [facility], [address] [place], [place] [address]   Magnesium     Status: None   Collection Time: 01/21/20  8:32 PM  Result Value Ref Range   Magnesium 1.7 1.7 - 2.4 mg/dL    Comment: Performed at [facility], [address] [place], [place]  [address]  Urinalysis, Complete w Microscopic     Status: Abnormal   Collection Time: 01/21/20  9:27 PM  Result Value Ref Range   Color, Urine STRAW (A) YELLOW   APPearance CLEAR (A) CLEAR   Specific Gravity, Urine 1.001 (L) 1.005 - 1.030   pH 5.0 5.0 - 8.0   Glucose, UA NEGATIVE NEGATIVE mg/dL   Hgb urine dipstick NEGATIVE NEGATIVE   Bilirubin Urine NEGATIVE NEGATIVE   Ketones, ur NEGATIVE NEGATIVE mg/dL   Protein, ur NEGATIVE NEGATIVE mg/dL   Nitrite NEGATIVE NEGATIVE   Leukocytes,Ua NEGATIVE NEGATIVE   WBC, UA NONE SEEN 0 - 5 WBC/hpf   Bacteria, UA NONE SEEN NONE SEEN   Squamous Epithelial / LPF NONE SEEN 0 - 5    Comment: Performed at [facility], [address], [place], [place] [address]  Respiratory Panel by RT PCR (Flu A&B, Covid) - Nasopharyngeal Swab     Status: None   Collection Time: 01/21/20  9:27 PM   Specimen: Nasopharyngeal Swab  Result Value Ref Range   SARS Coronavirus 2 by RT PCR NEGATIVE  NEGATIVE    Comment: (NOTE) SARS-CoV-2 target nucleic acids are NOT DETECTED.  The SARS-CoV-2 RNA is generally detectable in upper respiratoy specimens during the acute phase of infection. The lowest concentration of SARS-CoV-2 viral copies this assay can detect is 131 copies/mL. A negative result does not preclude SARS-Cov-2 infection and should not be used as the sole basis for treatment or other patient management decisions. A negative result may occur with  improper specimen collection/handling, submission of specimen other than nasopharyngeal swab, presence of viral mutation(s) within the areas targeted by this assay, and inadequate number of viral copies (<131 copies/mL). A negative result must be combined with clinical observations, patient history, and epidemiological information. The expected result is Negative.  Fact Sheet for Patients:  PinkCheek.be  Fact Sheet for Healthcare Providers:  GravelBags.it  This test is no t yet approved or cleared by the [place] FDA and  has been authorized for detection and/or diagnosis of SARS-CoV-2 by FDA under an Emergency Use Authorization (EUA). This EUA will remain  in effect (meaning this test can be used) for the duration of the COVID-19 declaration under Section 564(b)(1) of the Act, 21 U.S.C. section 360bbb-3(b)(1), unless the authorization is terminated or revoked sooner.     Influenza A by PCR NEGATIVE NEGATIVE   Influenza B by PCR NEGATIVE NEGATIVE    Comment: (NOTE) The Xpert Xpress SARS-CoV-2/FLU/RSV assay is intended as an aid in  the diagnosis of influenza from Nasopharyngeal swab specimens and  should not be used as a sole basis for treatment. Nasal washings and  aspirates are unacceptable for Xpert Xpress SARS-CoV-2/FLU/RSV  testing.  Fact Sheet for Patients: PinkCheek.be  Fact Sheet for Healthcare  Providers: GravelBags.it  This test is not yet approved or cleared by the [place] FDA and  has been authorized for detection and/or diagnosis of SARS-CoV-2 by  FDA under an Emergency Use Authorization (EUA). This EUA will remain  in effect (meaning this test can be used) for the duration of the  Covid-19 declaration under Section 564(b)(1) of the Act, 21  U.S.C. section 360bbb-3(b)(1), unless the authorization is  terminated or revoked. Performed at [facility], [address] [place], [place] [address]   Osmolality, urine     Status: Abnormal   Collection Time: 01/21/20  9:27 PM  Result Value Ref Range   Osmolality, Ur 83 (L) 300 - 900  mOsm/kg    Comment: Performed at [facility], [address] [place], [place] [address]  Sodium, urine, random     Status: None   Collection Time: 01/21/20  9:27 PM  Result Value Ref Range   Sodium, Ur 18 mmol/L    Comment: Performed at [facility], [address] [place], [place] [address]  Creatinine, urine, random     Status: None   Collection Time: 01/21/20  9:27 PM  Result Value Ref Range   Creatinine, Urine 13 mg/dL    Comment: Performed at [facility], [address] [place], [place] [address]  Osmolality     Status: Abnormal   Collection Time: 01/21/20 11:10 PM  Result Value Ref Range   Osmolality 256 (L) 275 - 295 mOsm/kg    Comment: Performed at [facility], [address] [place], [place] [address]  CBC     Status: Abnormal   Collection Time: 01/22/20  4:01 AM  Result Value Ref Range   WBC 9.6 4.0 - 10.5 K/uL   RBC 3.68 (L) 3.87 - 5.11 MIL/uL   Hemoglobin 12.0 12.0 - 15.0 g/dL   HCT 33.2 (L) 36 - 46 %   MCV 90.2 80.0 - 100.0 fL   MCH 32.6 26.0 - 34.0 pg   MCHC 36.1 (H) 30.0 - 36.0 g/dL   RDW 12.8 11.5 - 15.5 %   Platelets 456 (H) 150 - 400 K/uL   nRBC 0.0 0.0 - 0.2 %    Comment: Performed at [facility], [address], [place], [place] [address]  Basic metabolic panel     Status: Abnormal   Collection Time: 01/22/20  4:01 AM  Result Value Ref Range   Sodium 126 (L) 135 - 145 mmol/L   Potassium 3.9 3.5 - 5.1 mmol/L   Chloride 96 (L) 98 - 111 mmol/L   CO2 22 22 - 32 mmol/L   Glucose, Bld 77 70 - 99 mg/dL    Comment: Glucose reference range applies only to samples taken after fasting for at least 8 hours.   BUN 21 8 - 23 mg/dL   Creatinine, Ser 1.83 (H) 0.44 - 1.00 mg/dL   Calcium 8.9 8.9 - 10.3 mg/dL   GFR, Estimated 28 (L) >60 mL/min    Comment: (NOTE) Calculated using the CKD-EPI Creatinine Equation (2021)    Anion gap 8 5 - 15    Comment: Performed at [facility], [address] [place], [place] [address]    -------------------------------------------------------------------------- A&P:  Problem List Items Addressed This Visit    GERD   Relevant Medications   ondansetron (ZOFRAN ODT) 4 MG disintegrating tablet   sucralfate (CARAFATE) 1 g tablet   Other Relevant Orders   Ambulatory referral to Gastroenterology    Other Visit Diagnoses    Diverticulitis    -  Primary   Relevant Medications   sucralfate (CARAFATE) 1 g tablet   Other Relevant Orders   Ambulatory referral to Gastroenterology   Nausea       Relevant Medications   ondansetron (ZOFRAN ODT) 4 MG disintegrating tablet   Other Relevant Orders   Ambulatory referral to Gastroenterology     Acute on chronic vs recurrent diverticulitis flare w/ mixed functional GI symptoms Question if underlying GERD or other factors causing her symptoms Nausea w/o vomiting  Recent multiple rounds antibiotics treated for diverticulitis Virtual apt today, limited evaluation other than history Seems trigger was Ensure recently to worsen symptoms  HOLD any further antibiotics  Re order Zofran PRN Order Carafate PRN for GERD/Gastritis symptoms in question Continue PPI Lansoprazole  Referral to [facility] to return for evaluation of  these acute diverticulitis vs acute functional GI symptoms  Goal to maintain hydration / nutrition Can f/u with Nephrology as planned for labs  Return criteria reviewed  Orders Placed This Encounter  Procedures  . Ambulatory referral to Gastroenterology    Referral Priority:   Routine    Referral Type:   Consultation    Referral Reason:   Specialty Services Required    Number of Visits Requested:   1     Meds ordered this encounter  Medications  . ondansetron (ZOFRAN ODT) 4 MG disintegrating tablet    Sig: Take 1 tablet (4 mg total) by mouth every 8 (eight) hours as needed for nausea or vomiting.    Dispense:  30 tablet    Refill:  2  . sucralfate (CARAFATE) 1 g tablet    Sig: Take 1 tablet (1 g total) by mouth 4 (four) times daily -  with meals and at bedtime.    Dispense:  40 tablet    Refill:  1    As needed with meal with for abdominal pain or flare    Follow-up: - Return as needed  Patient verbalizes understanding with the above medical recommendations including the limitation of remote medical advice.  Specific follow-up and call-back criteria were given for patient to follow-up or seek medical care more urgently if needed.   - Time spent in direct consultation with patient on phone: 15 minutes   [name], [facility] 01/30/2020, 4:27 PM

## 2020-01-31 ENCOUNTER — Ambulatory Visit
Admission: RE | Admit: 2020-01-31 | Discharge: 2020-01-31 | Disposition: A | Discharge status: Home / Self Care | Payer: Medicare Other | Source: Ambulatory Visit | Attending: Radiation Oncology | Admitting: Radiation Oncology

## 2020-01-31 ENCOUNTER — Telehealth: Payer: Medicare Other | Admitting: Family Medicine

## 2020-01-31 DIAGNOSIS — Z510 Encounter for antineoplastic radiation therapy: Secondary | ICD-10-CM | POA: Diagnosis not present

## 2020-02-03 ENCOUNTER — Ambulatory Visit
Admission: RE | Admit: 2020-02-03 | Discharge: 2020-02-03 | Disposition: A | Discharge status: Home / Self Care | Payer: Medicare Other | Source: Ambulatory Visit | Attending: Nephrology | Admitting: Nephrology

## 2020-02-03 ENCOUNTER — Other Ambulatory Visit: Payer: Self-pay

## 2020-02-03 DIAGNOSIS — E860 Dehydration: Secondary | ICD-10-CM | POA: Insufficient documentation

## 2020-02-03 DIAGNOSIS — N183 Chronic kidney disease, stage 3 unspecified: Secondary | ICD-10-CM | POA: Insufficient documentation

## 2020-02-03 MED ORDER — SODIUM CHLORIDE 0.9 % IV SOLN: INTRAVENOUS | Status: AC

## 2020-02-07 ENCOUNTER — Other Ambulatory Visit: Payer: Self-pay

## 2020-02-07 ENCOUNTER — Encounter: Payer: Self-pay | Admitting: Gastroenterology

## 2020-02-07 ENCOUNTER — Ambulatory Visit (INDEPENDENT_AMBULATORY_CARE_PROVIDER_SITE_OTHER): Payer: Medicare Other | Admitting: Gastroenterology

## 2020-02-07 VITALS — BP 110/65 | HR 80 | Temp 97.7°F | Ht 65.0 in | Wt 117.4 lb

## 2020-02-07 DIAGNOSIS — R933 Abnormal findings on diagnostic imaging of other parts of digestive tract: Secondary | ICD-10-CM

## 2020-02-07 DIAGNOSIS — R634 Abnormal weight loss: Secondary | ICD-10-CM

## 2020-02-07 MED ORDER — NA SULFATE-K SULFATE-MG SULF 17.5-3.13-1.6 GM/177ML PO SOLN: 354.0000 mL | Freq: Once | ORAL | 0 refills | Status: AC

## 2020-02-07 NOTE — Progress Notes (Signed)
Margaret Darby, MD [address]  [place]  [place], [place] [address]  Main: [phone]  Fax: [phone]    Gastroenterology Consultation  Referring Provider:     Nobie Black * Primary Care Physician:  Margaret Hauser, DO Primary Gastroenterologist:  Dr. Cephas Black Reason for Consultation:     Unintentional weight loss, decreased appetite        HPI:   Margaret Black is a 79 y.o. female referred by Margaret Black, Margaret Doughty, DO  for consultation & management of unintentional weight loss, decreased appetite.  Patient has been diagnosed with acute sigmoid diverticulitis, uncomplicated in October, treated twice with antibiotics, initially with Augmentin, later switched to Cipro and Flagyl.  Patient also had severe hyponatremia that has been corrected with IV fluids.  Patient reports that after she finished antibiotics her abdominal cramps significantly improved.  She notices rumbling in her stomach only.  She lost about 10 pounds within last 1 month due to her illness, decreased appetite.  She still not able to eat, feels nauseous and dry heaving after a few bites.  She feels like everything is coming back up.  She denies heartburn, epigastric pain.  She has been taking Pepto-Bismol for nausea.  She has not had bowel movements.  She is prescribed Zofran which she says is not helping much.  She is also on Prevacid monthly.  She also reports rash in her bilateral upper and lower extremities which he thinks is progressing, has history of lupus, was previously on Plaquenil and methotrexate.  Followed by Dr. Jefm Black, has appointment to see him next week.  She denies fever, chills.  She does report generalized body pain and is not her normal self.  She also stopped taking all her supplements  She is married to her husband, for 60 years Does not smoke or alcohol  NSAIDs: None  Antiplts/Anticoagulants/Anti thrombotics: None  GI Procedures: Colonoscopy in 2009  extensive, pancolonic diverticulosis  Past Medical History:  Diagnosis Date  . Anxiety    Related to HTN and vision loss  . Central retinal vein occlusion of left eye    legally blind in Left eye  . 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 Collagen vascular disease ([place])    uncharacterized.  has seen rheum.  positive RF, positive serologies for Sjogrens.  C-ANCA/P-ANCA neg, ANA  neg, anti-dsDNA neg, anti-SCL70 neg, anti-centromere Ab neg  . COPD (chronic obstructive pulmonary disease) ([place])    quit smoking 1993  . Diverticulosis   . Dysfunction of eustachian tube   . Fatigue   . GERD (gastroesophageal reflux disease)   . Goiter, unspecified   . HLD (hyperlipidemia)   . HTN (hypertension)    difficult to control, hypertensive urgency hospitalizations, normal urine/plasma metanephrines and catecholamines, renal dopplers without stenosis  . Hyperaldosteronism    serum aldo/renin ration elevated (aldo 23.7, renin <0.15), CT abd 2010 with normal adrenals, dedicated adrenal CT without adrenal adenoma,   . Hypothyroid   . Insomnia    Related to HTN and vision loss  . Irritable bowel syndrome   . 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 Lung cancer [facility])    s/p R lobectomy 2001  . Lupus ([place])   . Osteopenia   . Pulmonary hypertension ([place])    echo [facility] 12/2008 with EF >5%, grade II diastolic dysfunction, RSVP >9mmHg, ACE level normal.  f/u echo at [facility] - EF 55-60%, mild MR,  mild LAE, PASP 25 + RA (no pulm HTN)  . Thyroid cancer ([place]) 2014  . Urge incontinence   . Uterine cancer [facility])    s/p hysterectomy 2001    Past Surgical History:  Procedure Laterality Date  . ABDOMINAL HYSTERECTOMY  06/1999  . APPENDECTOMY  1958  . CHOLECYSTECTOMY  1982  . CT ABD W & PELVIS WO CM  2010   neg except diverticulosis  . CT chest  2007   enlarged thyroid with goiter  . LOBECTOMY Right 04/1999   right lung, done in [place]  . lung nodule excision Left  2003   benign  . THYROIDECTOMY  2014  . TUBAL LIGATION  1975    Current Outpatient Medications:  .  acidophilus (RISAQUAD) CAPS capsule, 2 capsules daily (okay to substitute any probiotic), Disp: 60 capsule, Rfl: 0 .  BYSTOLIC 5 MG tablet, Take 1 tablet (5 mg total) by mouth daily., Disp: 90 tablet, Rfl: 3 .  hydrocortisone (ANUSOL-HC) 2.5 % rectal cream, Place rectally 3 (three) times daily., Disp: 30 g, Rfl: 0 .  lansoprazole (PREVACID) 15 MG capsule, Take 1 capsule (15 mg total) by mouth daily before breakfast., Disp: 90 capsule, Rfl: 3 .  LORazepam (ATIVAN) 0.5 MG tablet, Take 1 tablet (0.5 mg total) by mouth at bedtime., Disp: 30 tablet, Rfl: 5 .  NIFEdipine (PROCARDIA XL/NIFEDICAL XL) 60 MG 24 hr tablet, TAKE 1 TABLET EVERY EVENING (Patient taking differently: Take 60 mg by mouth every evening. TAKE 1 TABLET EVERY EVENING), Disp: 90 tablet, Rfl: 0 .  ondansetron (ZOFRAN ODT) 4 MG disintegrating tablet, Take 1 tablet (4 mg total) by mouth every 8 (eight) hours as needed for nausea or vomiting., Disp: 30 tablet, Rfl: 2 .  spironolactone (ALDACTONE) 25 MG tablet, Hold this medication until followup with your outpatient doctor due to dehydration., Disp: , Rfl:  .  SYNTHROID 88 MCG tablet, Take 1 tablet (88 mcg total) by mouth daily before breakfast., Disp: 90 tablet, Rfl: 3 .  CALCIUM-VITAMIN D PO, Take 1 tablet by mouth daily.  (Patient not taking: Reported on 02/07/2020), Disp: , Rfl:  .  Multiple Vitamin (MULTIVITAMIN) tablet, Take 1 tablet by mouth daily.  (Patient not taking: Reported on 02/07/2020), Disp: , Rfl:  .  Na Sulfate-K Sulfate-Mg Sulf 17.5-3.13-1.6 GM/177ML SOLN, Take 354 mLs by mouth once for 1 dose., Disp: 354 mL, Rfl: 0 .  sucralfate (CARAFATE) 1 g tablet, Take 1 tablet (1 g total) by mouth 4 (four) times daily -  with meals and at bedtime. (Patient not taking: Reported on 02/07/2020), Disp: 40 tablet, Rfl: 1 .  vitamin C (ASCORBIC ACID) 500 MG tablet, Take 500 mg by mouth  daily.  (Patient not taking: Reported on 02/07/2020), Disp: , Rfl:    Family History  Problem Relation Age of Onset  . Hypertension Mother   . Pneumonia Father   . Alcohol abuse Father      Social History   Tobacco Use  . Smoking status: Former Smoker    Types: Cigarettes    Quit date: 07/04/1991    Years since quitting: 28.6  . Smokeless tobacco: Former Systems developer  . Tobacco comment: Quit in early 1980's.  Vaping Use  . Vaping Use: Never used  Substance Use Topics  . Alcohol use: No  . Drug use: No    Allergies as of 02/07/2020 - Review Complete 02/07/2020  Allergen Reaction Noted  . Lactase Diarrhea 09/19/2014  . Atenolol Rash 08/12/2019  . Latex Rash  11/20/2015    Review of Systems:    All systems reviewed and negative except where noted in HPI.   Physical Exam:  BP 110/65 (BP Location: Left Arm, Patient Position: Sitting, Cuff Size: Normal)   Pulse 80   Temp 97.7 F (36.5 C) (Oral)   Ht 5\' 5"  (1.651 m)   Wt 117 lb 6 oz (53.2 kg)   BMI 19.53 kg/m  No LMP recorded. Patient has had a hysterectomy.  General:   Alert, thin built, moderately nourished, pleasant and cooperative in NAD Head:  Normocephalic and atraumatic. Eyes:  Sclera clear, no icterus.   Conjunctiva pink. Ears:  Normal auditory acuity. Nose:  No deformity, discharge, or lesions. Mouth:  No deformity or lesions,oropharynx pink & moist. Neck:  Supple; no masses or thyromegaly. Lungs:  Respirations even and unlabored.  Clear throughout to auscultation.   No wheezes, crackles, or rhonchi. No acute distress. Heart:  Regular rate and rhythm; no murmurs, clicks, rubs, or gallops. Abdomen:  Normal bowel sounds. Soft, non-tender and mildly distended, tympanic to percussion without masses, hepatosplenomegaly or hernias noted.  No guarding or rebound tenderness.   Rectal: Not performed Msk:  Symmetrical without gross deformities. Good, equal movement & strength bilaterally. Pulses:  Normal pulses  noted. Extremities:  No clubbing or edema.  No cyanosis. Neurologic:  Alert and oriented x3;  grossly normal neurologically. Skin: Maculopapular eruptions in bilateral upper and lower extremities. No jaundice. Lymph Nodes:  No significant cervical adenopathy. Psych:  Alert and cooperative. Normal mood and affect.  Imaging Studies: Reviewed  Assessment and Plan:   LOLETTA HARPER is a 79 y.o. female with with history of lupus, not on maintenance therapy, CKD, hypertension, hypothyroidism with recent episode of acute uncomplicated sigmoid diverticulitis, treated with antibiotics seen in consultation for decreased appetite, unintentional weight loss as well as intractable nausea and new onset of generalized rash.  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 Positive FANA and SSA antibodies. Interstitial nephritis. Question Raynaud's. Status post methotrexate. Plaquenil.   Follow-up with rheumatology next week    Follow up in 6 weeks   Margaret Darby, MD

## 2020-02-08 ENCOUNTER — Telehealth: Payer: Self-pay | Admitting: Gastroenterology

## 2020-02-08 LAB — COMPREHENSIVE METABOLIC PANEL
ALT: 14 IU/L (ref 0–32)
AST: 17 IU/L (ref 0–40)
Albumin/Globulin Ratio: 1.5 (ref 1.2–2.2)
Albumin: 4 g/dL (ref 3.7–4.7)
Alkaline Phosphatase: 80 IU/L (ref 44–121)
BUN/Creatinine Ratio: 10 — ABNORMAL LOW (ref 12–28)
BUN: 12 mg/dL (ref 8–27)
Bilirubin Total: 0.3 mg/dL (ref 0.0–1.2)
CO2: 21 mmol/L (ref 20–29)
Calcium: 9.5 mg/dL (ref 8.7–10.3)
Chloride: 97 mmol/L (ref 96–106)
Creatinine, Ser: 1.24 mg/dL — ABNORMAL HIGH (ref 0.57–1.00)
GFR calc Af Amer: 48 mL/min/{1.73_m2} — ABNORMAL LOW (ref >59)
GFR calc non Af Amer: 41 mL/min/{1.73_m2} — ABNORMAL LOW (ref >59)
Globulin, Total: 2.6 g/dL (ref 1.5–4.5)
Glucose: 94 mg/dL (ref 65–99)
Potassium: 4.5 mmol/L (ref 3.5–5.2)
Sodium: 135 mmol/L (ref 134–144)
Total Protein: 6.6 g/dL (ref 6.0–8.5)

## 2020-02-08 LAB — C-REACTIVE PROTEIN: CRP: 10 mg/L (ref 0–10)

## 2020-02-08 LAB — CBC
Hematocrit: 34.9 % (ref 34.0–46.6)
Hemoglobin: 11.7 g/dL (ref 11.1–15.9)
MCH: 31.7 pg (ref 26.6–33.0)
MCHC: 33.5 g/dL (ref 31.5–35.7)
MCV: 95 fL (ref 79–97)
Platelets: 473 10*3/uL — ABNORMAL HIGH (ref 150–450)
RBC: 3.69 x10E6/uL — ABNORMAL LOW (ref 3.77–5.28)
RDW: 12.9 % (ref 11.7–15.4)
WBC: 5.1 10*3/uL (ref 3.4–10.8)

## 2020-02-08 LAB — MAGNESIUM: Magnesium: 1.6 mg/dL (ref 1.6–2.3)

## 2020-02-08 LAB — PHOSPHORUS: Phosphorus: 3.6 mg/dL (ref 3.0–4.3)

## 2020-02-08 NOTE — Telephone Encounter (Signed)
Patient would like to cancel her procedure with Dr. Marius Ditch for 12.13.21. Patient states she has done the colon before with other GI physicians and they always diagnose her with diverticulitis, so she is not sure she wants to get it done again. Patient states she will speak with her lupus MD and call back after the Holidays. Please cancel colon for now. Pt was seen for ov on 11.16.21.

## 2020-02-08 NOTE — Telephone Encounter (Signed)
Called and canceled the procedure  With endo and documented in referral. Sending for [facility]

## 2020-02-14 DIAGNOSIS — L931 Subacute cutaneous lupus erythematosus: Secondary | ICD-10-CM | POA: Diagnosis not present

## 2020-02-20 ENCOUNTER — Telehealth: Payer: Self-pay

## 2020-02-20 ENCOUNTER — Other Ambulatory Visit: Payer: Self-pay

## 2020-02-20 DIAGNOSIS — R634 Abnormal weight loss: Secondary | ICD-10-CM

## 2020-02-20 DIAGNOSIS — R933 Abnormal findings on diagnostic imaging of other parts of digestive tract: Secondary | ICD-10-CM

## 2020-02-20 NOTE — Telephone Encounter (Signed)
Patient called and states her Rheumatologist wants her to have the colonoscopy and EGD. She wants to have this ASAP. Scheduled patient for 03/01/2020 with Dr. Marius Ditch updated the referral

## 2020-02-27 ENCOUNTER — Telehealth: Payer: Self-pay | Admitting: Gastroenterology

## 2020-02-27 NOTE — Telephone Encounter (Signed)
Patient calling stating she has a bladder infection and was told to start an antibiotic for it. Pt wanting to know if she can start it before procedure. Looks like pt procedure was canceled but I can not see when it was canceled or why. Please call pt back to discuss. Pt had ov on 11.16.21.

## 2020-02-28 ENCOUNTER — Other Ambulatory Visit
Admission: RE | Admit: 2020-02-28 | Discharge: 2020-02-28 | Disposition: A | Discharge status: Home / Self Care | Payer: Medicare Other | Source: Ambulatory Visit | Attending: Gastroenterology | Admitting: Gastroenterology

## 2020-02-28 ENCOUNTER — Other Ambulatory Visit: Payer: Self-pay

## 2020-02-28 DIAGNOSIS — Z20822 Contact with and (suspected) exposure to covid-19: Secondary | ICD-10-CM | POA: Insufficient documentation

## 2020-02-28 DIAGNOSIS — Z01812 Encounter for preprocedural laboratory examination: Secondary | ICD-10-CM | POA: Insufficient documentation

## 2020-02-28 LAB — SARS CORONAVIRUS 2 (TAT 6-24 HRS): SARS Coronavirus 2: NEGATIVE

## 2020-02-28 NOTE — Telephone Encounter (Signed)
Informed patient that she can start her antibiotic but the morning of her procedure not to take it till after her procedure is over

## 2020-02-29 ENCOUNTER — Telehealth: Payer: Self-pay | Admitting: Family Medicine

## 2020-02-29 ENCOUNTER — Ambulatory Visit: Payer: Medicare Other | Admitting: Radiation Oncology

## 2020-02-29 ENCOUNTER — Telehealth: Payer: Self-pay | Admitting: *Deleted

## 2020-02-29 ENCOUNTER — Telehealth: Payer: Self-pay

## 2020-02-29 DIAGNOSIS — E890 Postprocedural hypothyroidism: Secondary | ICD-10-CM

## 2020-02-29 DIAGNOSIS — N183 Chronic kidney disease, stage 3 unspecified: Secondary | ICD-10-CM

## 2020-02-29 DIAGNOSIS — J432 Centrilobular emphysema: Secondary | ICD-10-CM

## 2020-02-29 DIAGNOSIS — R531 Weakness: Secondary | ICD-10-CM

## 2020-02-29 DIAGNOSIS — I129 Hypertensive chronic kidney disease with stage 1 through stage 4 chronic kidney disease, or unspecified chronic kidney disease: Secondary | ICD-10-CM

## 2020-02-29 NOTE — Telephone Encounter (Signed)
Patient was expecting phone call back so call to inform Dr Raliegh Ip recommendation but unable to reach.

## 2020-02-29 NOTE — Telephone Encounter (Signed)
Margaret Black - here is new referral. Would you be able to help get her some sort of home nursing care aide or personal care services if eligible?  Reason for Referral: patient has had decline in function and request personal care services through medicare, home nursing aide   Has the referral been discussed with the patient?: yes   Designated contact for the referral if not the patient (name/phone number): patient on chart   Has the patient seen a specialist for this issue before?: no  If so, who (practice/provider)?   Does the patient have a provider or location preference for the referral?: [facility] Social Work / may need other support - will need assistance with Publishing rights manager.   Would the patient like to see previous specialist if applicable?  [name], DO [facility] 02/29/2020, 12:15 PM

## 2020-02-29 NOTE — Telephone Encounter (Signed)
Patient called and states she has had cleared liquids today and has a colonoscopy tomorrow. She states she is not supposed to be at the hospital till 9am and she is very weak. She states her daughter is not supposed to be at her house till midnight. She states she is scared to start the prep at 5 because she is so weak and if she falls her daughter will not be there and her husband is 44 and unable to help her up. Advised patient if she is that weak she can try drinking clear liquid soda that has sugar in it or candy. She states that is not going to help and she is clean out because she has not eaten in 3 weeks. Advised patient she had to still do the prep. She Asked if she could be admitted and do the prep in the hospital and do the colonoscopy tomorrow. Informed patient that the hospital will not admit a patient for that. She said okay and she would try drinking a soda. Patient then called the ENDO unit and asked if she could do her colonoscopy around 39 or 12:30pm. Trish told her no she could only do it when her time is scheduled and Dr. Marius Ditch does not do procedures that late. Trish called me because she said she wanted to rescheduled. Called patient and she states she is scared to do the prep advised patient that if she was not eating and losing weight she really needed to the procedure. She said she is scared to do the prep with out her daughter and her daughter lives out of state. Patient asked to move procedure to next week informed her we had no appointments next week. Informed her we could do January 3rd she said that is far with the symptoms  she is experiencing. She said she will call her neighbor to see if she will come be with her while she does the prep till her daughter gets to her house

## 2020-02-29 NOTE — Telephone Encounter (Signed)
Copied from [place] [id number]. Topic: General - Other >> Feb 29, 2020  9:12 AM Rainey Pines A wrote: Patient would like a callback from [facility] in regards to Dr. Raliegh Ip placing an order for her and her husband MRN:  [id number] to get a home health agency order. Please advise

## 2020-02-29 NOTE — Telephone Encounter (Signed)
Copied from [place] [id number]. Topic: General - Other >> Feb 29, 2020  9:12 AM Rainey Pines A wrote: Patient would like a callback from [facility] in regards to Dr. Raliegh Ip placing an order for her and her husband MRN:  [id number] to get a home health agency order. Please advise  I spoke with Cyndia Skeeters, FNP who is PCP for patient's husband.  She did not discuss Home Health with him at appointment. She was unaware of this need.  She would need more information and probably to follow back up with patient in future if Home Health orders are needed.  [name], [facility] 02/29/2020, 11:51 AM

## 2020-02-29 NOTE — Chronic Care Management (AMB) (Signed)
  Chronic Care Management   Note  02/29/2020 Name: Margaret Black MRN: [id number] DOB: 07/27/1940  Margaret Black is a 79 y.o. year old female who is a primary care patient of Olin Hauser, DO. I reached out to Margaret Black by phone today in response to a referral sent by [name] PCP, Olin Hauser, DO.     [name] was given information about Chronic Care Management services today including:  1. CCM service includes personalized support from designated clinical staff supervised by her physician, including individualized plan of care and coordination with other care providers 2. 24/7 contact phone numbers for assistance for urgent and routine care needs. 3. Service will only be billed when office clinical staff spend 20 minutes or more in a month to coordinate care. 4. Only one practitioner may furnish and bill the service in a calendar month. 5. The patient may stop CCM services at any time (effective at the end of the month) by phone call to the office staff. 6. The patient will be responsible for cost sharing (co-pay) of up to 20% of the service fee (after annual deductible is met).  Patient agreed to services and verbal consent obtained.   Follow up plan: Telephone appointment with care management team member scheduled for:03/06/2020  [place] [facility]

## 2020-03-01 ENCOUNTER — Inpatient Hospital Stay
Admission: RE | Admit: 2020-03-01 | Discharge: 2020-03-20 | DRG: 391 | Disposition: A | Discharge status: Home Health | Payer: Medicare Other | Attending: Internal Medicine | Admitting: Internal Medicine

## 2020-03-01 ENCOUNTER — Other Ambulatory Visit: Payer: Self-pay

## 2020-03-01 ENCOUNTER — Ambulatory Visit: Payer: Medicare Other

## 2020-03-01 ENCOUNTER — Encounter: Payer: Self-pay | Admitting: Gastroenterology

## 2020-03-01 ENCOUNTER — Encounter
Admission: RE | Disposition: A | Discharge status: Home / Self Care | Payer: Self-pay | Source: Home / Self Care | Attending: Internal Medicine

## 2020-03-01 ENCOUNTER — Ambulatory Visit: Payer: Medicare Other | Admitting: Certified Registered"

## 2020-03-01 DIAGNOSIS — R634 Abnormal weight loss: Secondary | ICD-10-CM | POA: Diagnosis not present

## 2020-03-01 DIAGNOSIS — R933 Abnormal findings on diagnostic imaging of other parts of digestive tract: Secondary | ICD-10-CM

## 2020-03-01 DIAGNOSIS — D631 Anemia in chronic kidney disease: Secondary | ICD-10-CM | POA: Diagnosis not present

## 2020-03-01 DIAGNOSIS — Z8542 Personal history of malignant neoplasm of other parts of uterus: Secondary | ICD-10-CM

## 2020-03-01 DIAGNOSIS — E876 Hypokalemia: Secondary | ICD-10-CM | POA: Diagnosis not present

## 2020-03-01 DIAGNOSIS — Z85850 Personal history of malignant neoplasm of thyroid: Secondary | ICD-10-CM

## 2020-03-01 DIAGNOSIS — E43 Unspecified severe protein-calorie malnutrition: Secondary | ICD-10-CM | POA: Insufficient documentation

## 2020-03-01 DIAGNOSIS — Z87891 Personal history of nicotine dependence: Secondary | ICD-10-CM

## 2020-03-01 DIAGNOSIS — Z79899 Other long term (current) drug therapy: Secondary | ICD-10-CM | POA: Diagnosis not present

## 2020-03-01 DIAGNOSIS — N183 Chronic kidney disease, stage 3 unspecified: Secondary | ICD-10-CM | POA: Diagnosis not present

## 2020-03-01 DIAGNOSIS — I272 Pulmonary hypertension, unspecified: Secondary | ICD-10-CM | POA: Diagnosis present

## 2020-03-01 DIAGNOSIS — K7689 Other specified diseases of liver: Secondary | ICD-10-CM | POA: Diagnosis not present

## 2020-03-01 DIAGNOSIS — N1832 Chronic kidney disease, stage 3b: Secondary | ICD-10-CM | POA: Diagnosis present

## 2020-03-01 DIAGNOSIS — F419 Anxiety disorder, unspecified: Secondary | ICD-10-CM | POA: Diagnosis present

## 2020-03-01 DIAGNOSIS — Z85118 Personal history of other malignant neoplasm of bronchus and lung: Secondary | ICD-10-CM | POA: Diagnosis not present

## 2020-03-01 DIAGNOSIS — H548 Legal blindness, as defined in USA: Secondary | ICD-10-CM | POA: Diagnosis present

## 2020-03-01 DIAGNOSIS — E785 Hyperlipidemia, unspecified: Secondary | ICD-10-CM | POA: Diagnosis present

## 2020-03-01 DIAGNOSIS — Z681 Body mass index (BMI) 19 or less, adult: Secondary | ICD-10-CM | POA: Diagnosis not present

## 2020-03-01 DIAGNOSIS — Z90710 Acquired absence of both cervix and uterus: Secondary | ICD-10-CM

## 2020-03-01 DIAGNOSIS — I129 Hypertensive chronic kidney disease with stage 1 through stage 4 chronic kidney disease, or unspecified chronic kidney disease: Secondary | ICD-10-CM | POA: Diagnosis present

## 2020-03-01 DIAGNOSIS — J449 Chronic obstructive pulmonary disease, unspecified: Secondary | ICD-10-CM | POA: Diagnosis present

## 2020-03-01 DIAGNOSIS — I10 Essential (primary) hypertension: Secondary | ICD-10-CM | POA: Diagnosis present

## 2020-03-01 DIAGNOSIS — K651 Peritoneal abscess: Secondary | ICD-10-CM | POA: Diagnosis present

## 2020-03-01 DIAGNOSIS — K56609 Unspecified intestinal obstruction, unspecified as to partial versus complete obstruction: Secondary | ICD-10-CM | POA: Diagnosis present

## 2020-03-01 DIAGNOSIS — K5792 Diverticulitis of intestine, part unspecified, without perforation or abscess without bleeding: Secondary | ICD-10-CM

## 2020-03-01 DIAGNOSIS — Z66 Do not resuscitate: Secondary | ICD-10-CM | POA: Diagnosis present

## 2020-03-01 DIAGNOSIS — E890 Postprocedural hypothyroidism: Secondary | ICD-10-CM | POA: Diagnosis present

## 2020-03-01 DIAGNOSIS — K567 Ileus, unspecified: Secondary | ICD-10-CM | POA: Diagnosis not present

## 2020-03-01 DIAGNOSIS — N189 Chronic kidney disease, unspecified: Secondary | ICD-10-CM

## 2020-03-01 DIAGNOSIS — E039 Hypothyroidism, unspecified: Secondary | ICD-10-CM | POA: Diagnosis not present

## 2020-03-01 DIAGNOSIS — Z4659 Encounter for fitting and adjustment of other gastrointestinal appliance and device: Secondary | ICD-10-CM

## 2020-03-01 DIAGNOSIS — N179 Acute kidney failure, unspecified: Secondary | ICD-10-CM | POA: Diagnosis present

## 2020-03-01 DIAGNOSIS — Z79890 Hormone replacement therapy (postmenopausal): Secondary | ICD-10-CM

## 2020-03-01 DIAGNOSIS — E269 Hyperaldosteronism, unspecified: Secondary | ICD-10-CM | POA: Diagnosis not present

## 2020-03-01 DIAGNOSIS — R110 Nausea: Secondary | ICD-10-CM | POA: Diagnosis not present

## 2020-03-01 DIAGNOSIS — K5720 Diverticulitis of large intestine with perforation and abscess without bleeding: Secondary | ICD-10-CM | POA: Diagnosis not present

## 2020-03-01 DIAGNOSIS — R739 Hyperglycemia, unspecified: Secondary | ICD-10-CM | POA: Diagnosis not present

## 2020-03-01 DIAGNOSIS — K589 Irritable bowel syndrome without diarrhea: Secondary | ICD-10-CM | POA: Diagnosis present

## 2020-03-01 DIAGNOSIS — Z20822 Contact with and (suspected) exposure to covid-19: Secondary | ICD-10-CM | POA: Diagnosis present

## 2020-03-01 DIAGNOSIS — K660 Peritoneal adhesions (postprocedural) (postinfection): Secondary | ICD-10-CM | POA: Diagnosis not present

## 2020-03-01 DIAGNOSIS — K630 Abscess of intestine: Secondary | ICD-10-CM | POA: Diagnosis not present

## 2020-03-01 DIAGNOSIS — T50905A Adverse effect of unspecified drugs, medicaments and biological substances, initial encounter: Secondary | ICD-10-CM | POA: Diagnosis not present

## 2020-03-01 DIAGNOSIS — K219 Gastro-esophageal reflux disease without esophagitis: Secondary | ICD-10-CM | POA: Diagnosis present

## 2020-03-01 HISTORY — DX: Type 2 diabetes mellitus without complications: E11.9

## 2020-03-01 HISTORY — PX: COLONOSCOPY WITH PROPOFOL: SHX5780

## 2020-03-01 HISTORY — PX: ESOPHAGOGASTRODUODENOSCOPY (EGD) WITH PROPOFOL: SHX5813

## 2020-03-01 LAB — CBC WITH DIFFERENTIAL/PLATELET
Abs Immature Granulocytes: 0.03 10*3/uL (ref 0.00–0.07)
Basophils Absolute: 0 10*3/uL (ref 0.0–0.1)
Basophils Relative: 0 %
Eosinophils Absolute: 0 10*3/uL (ref 0.0–0.5)
Eosinophils Relative: 0 %
HCT: 30 % — ABNORMAL LOW (ref 36.0–46.0)
Hemoglobin: 10.4 g/dL — ABNORMAL LOW (ref 12.0–15.0)
Immature Granulocytes: 0 %
Lymphocytes Relative: 4 %
Lymphs Abs: 0.4 10*3/uL — ABNORMAL LOW (ref 0.7–4.0)
MCH: 32 pg (ref 26.0–34.0)
MCHC: 34.7 g/dL (ref 30.0–36.0)
MCV: 92.3 fL (ref 80.0–100.0)
Monocytes Absolute: 0.4 10*3/uL (ref 0.1–1.0)
Monocytes Relative: 4 %
Neutro Abs: 9.3 10*3/uL — ABNORMAL HIGH (ref 1.7–7.7)
Neutrophils Relative %: 92 %
Platelets: 298 10*3/uL (ref 150–400)
RBC: 3.25 MIL/uL — ABNORMAL LOW (ref 3.87–5.11)
RDW: 13.3 % (ref 11.5–15.5)
WBC: 10.1 10*3/uL (ref 4.0–10.5)
nRBC: 0 % (ref 0.0–0.2)

## 2020-03-01 LAB — URINALYSIS, COMPLETE (UACMP) WITH MICROSCOPIC
Bilirubin Urine: NEGATIVE
Glucose, UA: NEGATIVE mg/dL
Hgb urine dipstick: NEGATIVE
Ketones, ur: 5 mg/dL — AB
Nitrite: NEGATIVE
Protein, ur: NEGATIVE mg/dL
Specific Gravity, Urine: 1.01 (ref 1.005–1.030)
pH: 5 (ref 5.0–8.0)

## 2020-03-01 LAB — BASIC METABOLIC PANEL
Anion gap: 15 (ref 5–15)
BUN: 19 mg/dL (ref 8–23)
CO2: 25 mmol/L (ref 22–32)
Calcium: 8.8 mg/dL — ABNORMAL LOW (ref 8.9–10.3)
Chloride: 96 mmol/L — ABNORMAL LOW (ref 98–111)
Creatinine, Ser: 2.07 mg/dL — ABNORMAL HIGH (ref 0.44–1.00)
GFR, Estimated: 24 mL/min — ABNORMAL LOW (ref >60)
Glucose, Bld: 157 mg/dL — ABNORMAL HIGH (ref 70–99)
Potassium: 3.9 mmol/L (ref 3.5–5.1)
Sodium: 136 mmol/L (ref 135–145)

## 2020-03-01 SURGERY — COLONOSCOPY WITH PROPOFOL
Anesthesia: General

## 2020-03-01 MED ORDER — NIFEDIPINE ER 60 MG PO TB24
60.0000 mg | ORAL_TABLET | Freq: Every day | ORAL | Status: DC
  Administered 2020-03-01 – 2020-03-20 (×15): 60 mg via ORAL
  Filled 2020-03-01 (×21): qty 1

## 2020-03-01 MED ORDER — FENTANYL CITRATE (PF) 100 MCG/2ML IJ SOLN
25.0000 ug | Freq: Once | INTRAMUSCULAR | Status: AC
  Administered 2020-03-01: 25 ug via INTRAVENOUS

## 2020-03-01 MED ORDER — NEBIVOLOL HCL 5 MG PO TABS
5.0000 mg | ORAL_TABLET | Freq: Every day | ORAL | Status: DC
  Administered 2020-03-01 – 2020-03-18 (×17): 5 mg via ORAL
  Filled 2020-03-01 (×21): qty 1

## 2020-03-01 MED ORDER — SODIUM CHLORIDE 0.9 % IV SOLN: INTRAVENOUS | Status: AC

## 2020-03-01 MED ORDER — LORAZEPAM 0.5 MG PO TABS
0.5000 mg | ORAL_TABLET | Freq: Every day | ORAL | Status: DC
  Administered 2020-03-01 – 2020-03-19 (×18): 0.5 mg via ORAL
  Filled 2020-03-01 (×18): qty 1

## 2020-03-01 MED ORDER — LEVOTHYROXINE SODIUM 88 MCG PO TABS
88.0000 ug | ORAL_TABLET | Freq: Every day | ORAL | Status: DC
  Administered 2020-03-02 – 2020-03-20 (×19): 88 ug via ORAL
  Filled 2020-03-01 (×20): qty 1

## 2020-03-01 MED ORDER — ONDANSETRON HCL 4 MG/2ML IJ SOLN
4.0000 mg | Freq: Four times a day (QID) | INTRAMUSCULAR | Status: DC | PRN
  Administered 2020-03-01 – 2020-03-07 (×9): 4 mg via INTRAVENOUS
  Filled 2020-03-01 (×12): qty 2

## 2020-03-01 MED ORDER — HYDROMORPHONE HCL 1 MG/ML IJ SOLN
0.5000 mg | Freq: Once | INTRAMUSCULAR | Status: AC
  Administered 2020-03-01: 0.5 mg via INTRAVENOUS
  Filled 2020-03-01: qty 0.5

## 2020-03-01 MED ORDER — SUCRALFATE 1 G PO TABS: 1.0000 g | ORAL_TABLET | Freq: Three times a day (TID) | ORAL | Status: DC

## 2020-03-01 MED ORDER — MORPHINE SULFATE (PF) 2 MG/ML IV SOLN
2.0000 mg | INTRAVENOUS | Status: DC | PRN
  Administered 2020-03-01 – 2020-03-15 (×48): 2 mg via INTRAVENOUS
  Filled 2020-03-01 (×52): qty 1

## 2020-03-01 MED ORDER — IOHEXOL 9 MG/ML PO SOLN: 500.0000 mL | ORAL | Status: AC

## 2020-03-01 MED ORDER — ACETAMINOPHEN 325 MG PO TABS: 650.0000 mg | ORAL_TABLET | Freq: Once | ORAL | Status: DC

## 2020-03-01 MED ORDER — FENTANYL CITRATE (PF) 100 MCG/2ML IJ SOLN: 25.0000 ug | Freq: Once | INTRAMUSCULAR | Status: AC

## 2020-03-01 MED ORDER — ENOXAPARIN SODIUM 30 MG/0.3ML ~~LOC~~ SOLN: 30.0000 mg | SUBCUTANEOUS | Status: DC

## 2020-03-01 MED ORDER — PANTOPRAZOLE SODIUM 20 MG PO TBEC
20.0000 mg | DELAYED_RELEASE_TABLET | Freq: Every day | ORAL | Status: DC
  Administered 2020-03-02 – 2020-03-10 (×8): 20 mg via ORAL
  Filled 2020-03-01 (×10): qty 1

## 2020-03-01 MED ORDER — SODIUM CHLORIDE 0.9 % IV SOLN: INTRAVENOUS | Status: DC

## 2020-03-01 MED ORDER — PROMETHAZINE HCL 25 MG/ML IJ SOLN
6.2500 mg | Freq: Once | INTRAMUSCULAR | Status: AC
  Administered 2020-03-01: 6.25 mg via INTRAVENOUS
  Filled 2020-03-01: qty 1

## 2020-03-01 MED ORDER — ACETAMINOPHEN 500 MG PO TABS
ORAL_TABLET | ORAL | Status: AC
  Filled 2020-03-01: qty 1

## 2020-03-01 MED ORDER — ENOXAPARIN SODIUM 40 MG/0.4ML ~~LOC~~ SOLN: 40.0000 mg | SUBCUTANEOUS | Status: DC

## 2020-03-01 MED ORDER — PIPERACILLIN-TAZOBACTAM IN DEX 2-0.25 GM/50ML IV SOLN
2.2500 g | Freq: Three times a day (TID) | INTRAVENOUS | Status: DC
  Administered 2020-03-01 – 2020-03-03 (×6): 2.25 g via INTRAVENOUS
  Filled 2020-03-01 (×8): qty 50

## 2020-03-01 MED ORDER — PIPERACILLIN-TAZOBACTAM 3.375 G IVPB: 3.3750 g | Freq: Three times a day (TID) | INTRAVENOUS | Status: DC

## 2020-03-01 MED ORDER — ONDANSETRON HCL 4 MG PO TABS
4.0000 mg | ORAL_TABLET | Freq: Four times a day (QID) | ORAL | Status: DC | PRN
  Administered 2020-03-03 – 2020-03-06 (×5): 4 mg via ORAL
  Filled 2020-03-01 (×5): qty 1

## 2020-03-01 MED ORDER — FENTANYL CITRATE (PF) 100 MCG/2ML IJ SOLN
INTRAMUSCULAR | Status: AC
  Administered 2020-03-01: 25 ug via INTRAVENOUS
  Filled 2020-03-01: qty 2

## 2020-03-01 MED ORDER — RISAQUAD PO CAPS: 2.0000 | ORAL_CAPSULE | Freq: Every day | ORAL | Status: DC

## 2020-03-01 NOTE — Progress Notes (Addendum)
Patient arrived in the preop for EGD and colonoscopy today.  She reports that for last 2 days, she has been experiencing worsening of lower abdominal pain and she had an episode of vomiting while doing the prep.  She also reports feeling shaky.  She reports that this symptoms are similar to when she had an episode of diverticulitis.   Therefore, I recommend stat CT abdomen and pelvis without contrast. Will get BMP. If CT is unremarkable, will proceed with upper endoscopy and colonoscopy today.  Discussed my recommendations with patient and her daughter (who is a Marine scientist and drove from [place]) are both agreeable  Cephas Darby, MD [address]  [place]  [place], [place] [address]  Main: [phone]  Fax: [phone] Pager: [phone]

## 2020-03-01 NOTE — Consult Note (Signed)
Pharmacy Antibiotic Note  Margaret Black is a 79 y.o. female admitted on 03/01/2020 with intra-abdominal infection/acute diverticulitis.    Pharmacy has been consulted for Zosyn dosing.  Plan: Zosyn 3.375g IV q8h (4 hour infusion).  Height: 5\' 5"  (165.1 cm) Weight: 52.2 kg (115 lb) IBW/kg (Calculated) : 57  Temp (24hrs), Avg:98.7 F (37.1 C), Min:98.6 F (37 C), Max:98.8 F (37.1 C)  Recent Labs  Lab 03/01/20 1153  CREATININE 2.07*    Estimated Creatinine Clearance: 18.2 mL/min (A) (by C-G formula based on SCr of 2.07 mg/dL (H)).    Allergies  Allergen Reactions  . Lactase Diarrhea  . Atenolol Rash  . Latex Rash    Antimicrobials this admission: Zosyn 12/9 >>  Dose adjustments this admission: none  Microbiology results: GI panel/C diff pending  Thank you for allowing pharmacy to be a part of this patient's care.  Lu Duffel, PharmD, BCPS Clinical Pharmacist 03/01/2020 1:51 PM

## 2020-03-01 NOTE — Consult Note (Signed)
Margaret Darby, MD [address]  [place]  [place], [place] [address]  Main: [phone]  Fax: [phone] Pager: [phone]   Consultation  Referring Provider:     No ref. provider found Primary Care Physician:  Olin Hauser, DO Primary Gastroenterologist:  Dr. Sherri Sear         Reason for Consultation:     Sigmoid diverticulitis  Date of Admission:  03/01/2020 Date of Consultation:  03/01/2020         HPI:   Margaret Black is a 79 y.o. female with history of lupus, not on maintenance therapy, CKD, hypertension, hypothyroidism, acute uncomplicated sigmoid diverticulitis in 12/2019, treated with antibiotics.  Patient was originally seen by me on 02/07/2020 as an outpatient for unintentional weight loss, decreased appetite, intractable nausea.  Her CRP was normal as outpatient.  Original plan was to undergo upper endoscopy as well as colonoscopy today.  However, patient developed worsening of lower abdominal pain within last 2 days, associated with an episode of emesis of the bowel prep last night.  She also reports shaking of bilateral upper extremities.  Patient is accompanied by her daughter to the endoscopy unit.  Due to new onset of abdominal pain, she underwent CT abdomen and pelvis without contrast stat which revealed persistent inflammation of the sigmoid colon consistent with diverticulitis with no evidence of perforation or abscess, worsening of inflammation.  Therefore, the procedures were canceled and patient is admitted to the medicine service and hospitalist has been notified. Patient is receiving IV fluids, received fentanyl to manage her abdominal pain.  Her last bowel movement was light brown in color, liquid with some sediment this morning.  COVID-19 test was negative.  BMP revealed worsening of creatinine, sodium and potassium were normal  She is married to her husband, for 60 years Does not smoke or alcohol  NSAIDs:  None  Antiplts/Anticoagulants/Anti thrombotics: None  GI Procedures: Colonoscopy in 2009 extensive, pancolonic diverticulosis  Past Medical History:  Diagnosis Date  . Anxiety    Related to HTN and vision loss  . Central retinal vein occlusion of left eye    legally blind in Left eye  . 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 Collagen vascular disease ([place])    uncharacterized.  has seen rheum.  positive RF, positive serologies for Sjogrens.  C-ANCA/P-ANCA neg, ANA  neg, anti-dsDNA neg, anti-SCL70 neg, anti-centromere Ab neg  . COPD (chronic obstructive pulmonary disease) ([place])    quit smoking 1993  . Diverticulosis   . Dysfunction of eustachian tube   . Fatigue   . GERD (gastroesophageal reflux disease)   . Goiter, unspecified   . HLD (hyperlipidemia)   . HTN (hypertension)    difficult to control, hypertensive urgency hospitalizations, normal urine/plasma metanephrines and catecholamines, renal dopplers without stenosis  . Hyperaldosteronism    serum aldo/renin ration elevated (aldo 23.7, renin <0.15), CT abd 2010 with normal adrenals, dedicated adrenal CT without adrenal adenoma,   . Hypothyroid   . Insomnia    Related to HTN and vision loss  . Irritable bowel syndrome   . Leiomyosarcoma of uterus ([place]) 09/22/2014  . Lung cancer [facility])    s/p R lobectomy 2001  . Lupus ([place])   . Osteopenia   . Pulmonary hypertension ([place])    echo [facility] 12/2008 with EF >5%, grade II diastolic dysfunction, RSVP >60mHg, ACE level normal.  f/u echo [place]- EF 55-60%,  mild MR, mild LAE, PASP 25 + RA (no pulm HTN)  . Thyroid cancer ([place]) 2014  . Urge incontinence   . Uterine cancer [facility])    s/p hysterectomy 2001    Past Surgical History:  Procedure Laterality Date  . ABDOMINAL HYSTERECTOMY  06/1999  . APPENDECTOMY  1958  . CHOLECYSTECTOMY  1982  . CT ABD W & PELVIS WO CM  2010   neg except diverticulosis  . CT chest  2007   enlarged thyroid  with goiter  . LOBECTOMY Right 04/1999   right lung, done in [place]  . lung nodule excision Left 2003   benign  . THYROIDECTOMY  2014  . TUBAL LIGATION  1975    Current Facility-Administered Medications:  .  0.9 %  sodium chloride infusion, , Intravenous, Continuous, Densil Ottey, Tally Due, MD .  0.9 %  sodium chloride infusion, , Intravenous, Continuous, Agbata, Tochukwu, MD .  acetaminophen (TYLENOL) tablet 650 mg, 650 mg, Oral, Once, Tariq Pernell, Tally Due, MD .  acidophilus (RISAQUAD) capsule 2 capsule, 2 capsule, Oral, Daily, Agbata, Tochukwu, MD .  enoxaparin (LOVENOX) injection 40 mg, 40 mg, Subcutaneous, Q24H, Agbata, Tochukwu, MD .  Derrill Memo ON 03/02/2020] levothyroxine (SYNTHROID) tablet 88 mcg, 88 mcg, Oral, QAC breakfast, Agbata, Tochukwu, MD .  LORazepam (ATIVAN) tablet 0.5 mg, 0.5 mg, Oral, QHS, Agbata, Tochukwu, MD .  morphine 2 MG/ML injection 2 mg, 2 mg, Intravenous, Q4H PRN, Agbata, Tochukwu, MD .  nebivolol (BYSTOLIC) tablet 5 mg, 5 mg, Oral, Daily, Agbata, Tochukwu, MD .  NIFEdipine (ADALAT CC) 24 hr tablet 60 mg, 60 mg, Oral, QPM, Agbata, Tochukwu, MD .  ondansetron (ZOFRAN) tablet 4 mg, 4 mg, Oral, Q6H PRN **OR** ondansetron (ZOFRAN) injection 4 mg, 4 mg, Intravenous, Q6H PRN, Agbata, Tochukwu, MD .  pantoprazole (PROTONIX) EC tablet 20 mg, 20 mg, Oral, Daily, Agbata, Tochukwu, MD .  sucralfate (CARAFATE) tablet 1 g, 1 g, Oral, TID WC & HS, Agbata, Tochukwu, MD   Family History  Problem Relation Age of Onset  . Hypertension Mother   . Pneumonia Father   . Alcohol abuse Father      Social History   Tobacco Use  . Smoking status: Former Smoker    Types: Cigarettes    Quit date: 07/04/1991    Years since quitting: 28.6  . Smokeless tobacco: Former Systems developer  . Tobacco comment: Quit in early 1980's.  Vaping Use  . Vaping Use: Never used  Substance Use Topics  . Alcohol use: No  . Drug use: No    Allergies as of 02/20/2020 - Review Complete 02/07/2020  Allergen  Reaction Noted  . Lactase Diarrhea 09/19/2014  . Atenolol Rash 08/12/2019  . Latex Rash 11/20/2015    Review of Systems:    All systems reviewed and negative except where noted in HPI.   Physical Exam:  Vital signs in last 24 hours: Temp:  [98.6 F (37 C)] 98.6 F (37 C) (12/09 1007) Pulse Rate:  [73] 73 (12/09 1007) Resp:  [18] 18 (12/09 1007) BP: (143)/(63) 143/63 (12/09 1007) SpO2:  [100 %] 100 % (12/09 1007) Weight:  [52.2 kg] 52.2 kg (12/09 1007)   General:   Pleasant, cooperative in NAD Head:  Normocephalic and atraumatic. Eyes:   No icterus.   Conjunctiva pink. PERRLA. Ears:  Normal auditory acuity. Neck:  Supple; no masses or thyroidomegaly Lungs: Respirations even and unlabored. Lungs clear to auscultation bilaterally.   No wheezes, crackles, or rhonchi.  Heart:  Regular rate and  rhythm;  Without murmur, clicks, rubs or gallops Abdomen:  Soft, nondistended, mild to moderate tenderness in left lower quadrant as well as right lower quadrant, worse in left side. Normal bowel sounds. No appreciable masses or hepatomegaly.  No rebound or guarding.  Rectal:  Not performed. Msk:  Symmetrical without gross deformities.  Strength generalized weakness Extremities:  Without edema, cyanosis or clubbing. Neurologic:  Alert and oriented x3;  grossly normal neurologically. Skin:  Intact without significant lesions or rashes. Psych:  Alert and cooperative. Normal affect.  LAB RESULTS: CBC Latest Ref Rng & Units 02/07/2020 01/22/2020 01/21/2020  WBC 3.4 - 10.8 x10E3/uL 5.1 9.6 13.1(H)  Hemoglobin 11.1 - 15.9 g/dL 11.7 12.0 11.5(L)  Hematocrit 34.0 - 46.6 % 34.9 33.2(L) 32.2(L)  Platelets 150 - 450 x10E3/uL 473(H) 456(H) 453(H)    BMET BMP Latest Ref Rng & Units 03/01/2020 02/07/2020 01/22/2020  Glucose 70 - 99 mg/dL 157(H) 94 77  BUN 8 - 23 mg/dL 19 12 21   Creatinine 0.44 - 1.00 mg/dL 2.07(H) 1.24(H) 1.83(H)  BUN/Creat Ratio 12 - 28 - 10(L) -  Sodium 135 - 145 mmol/L 136 135  126(L)  Potassium 3.5 - 5.1 mmol/L 3.9 4.5 3.9  Chloride 98 - 111 mmol/L 96(L) 97 96(L)  CO2 22 - 32 mmol/L 25 21 22   Calcium 8.9 - 10.3 mg/dL 8.8(L) 9.5 8.9    LFT Hepatic Function Latest Ref Rng & Units 02/07/2020 01/06/2020 08/02/2018  Total Protein 6.0 - 8.5 g/dL 6.6 7.2 7.1  Albumin 3.7 - 4.7 g/dL 4.0 3.8 -  AST 0 - 40 IU/L 17 25 18   ALT 0 - 32 IU/L 14 19 16   Alk Phosphatase 44 - 121 IU/L 80 73 -  Total Bilirubin 0.0 - 1.2 mg/dL 0.3 1.4(H) 0.8  Bilirubin, Direct 0.0 - 0.3 mg/dL - - -     STUDIES: CT ABDOMEN PELVIS WO CONTRAST  Result Date: 03/01/2020 CLINICAL DATA:  Left lower quadrant pain. Clinical suspicion of diverticulitis. Patient scheduled for colonoscopy today. EXAM: CT ABDOMEN AND PELVIS WITHOUT CONTRAST TECHNIQUE: Multidetector CT imaging of the abdomen and pelvis was performed following the standard protocol without IV contrast. COMPARISON:  01/07/2020 FINDINGS: Lower chest: No change in a 12 mm by 4 mm pulmonary nodule in the left lower lobe. Areas of scarring at the lung bases are unchanged. Coronary artery calcification and aortic atherosclerotic calcification are noted. Hepatobiliary: Previous cholecystectomy. Cyst in the right lobe of the liver as seen previously. No acute liver finding. Pancreas: Normal Spleen: Normal Adrenals/Urinary Tract: Adrenal glands are normal. Kidneys are normal. No cyst, mass, stone or hydronephrosis. Bladder is normal. Stomach/Bowel: Considerable amount of intestinal fluid presumably related to the colon prep. There is persistent evidence diverticulitis at the proximal sigmoid colon, with regional inflammatory changes that appear worse compared with the study of 01/07/2020. Few mildly dilated small bowel loops in that region could represent a localized ileus secondary to the inflammatory changes. No evidence of drainable abscess. No free intraperitoneal fluid or air. Vascular/Lymphatic: Aortic atherosclerosis. No aneurysm. IVC is normal. Aortic  branch vessel atherosclerosis. No adenopathy. Reproductive: Previous hysterectomy.  No pelvic mass. Other: None Musculoskeletal: Ordinary lower lumbar degenerative changes. IMPRESSION: 1. Persistent evidence of diverticulitis at the proximal sigmoid colon, with regional inflammatory changes that appear worse compared with the study of 01/07/2020. No evidence of drainable abscess. Few mildly dilated small bowel loops in that region could represent a localized ileus secondary to the inflammatory changes. 2. Considerable amount of intestinal fluid presumably related  to the colon prep. 3. Aortic atherosclerosis. Coronary artery calcification. 4. No change in a 12 mm by 4 mm pulmonary nodule in the left lower lobe. Aortic Atherosclerosis (ICD10-I70.0). Electronically Signed   By: Nelson Chimes M.D.   On: 03/01/2020 11:29      Impression / Plan:   RINI MOFFIT is a 79 y.o. female with history of CKD, lupus not on maintenance therapy, hypertension, hypothyroidism, acute uncomplicated sigmoid diverticulitis in 10/21 presented with recurrence of lower abdominal pain, CT confirmed persistent sigmoid diverticulitis  Subacute to chronic/recurrent sigmoid diverticulitis with no evidence of perforation Agree with IV antibiotics, Zosyn Okay with clear liquid diet Also, recommend stool studies to rule out infection particularly C. Difficile Maintenance IV fluids as needed, monitor electrolytes closely Recommend general surgery consult to evaluate for sigmoid colectomy in setting of recurrent sigmoid diverticulitis  Discussed my recommendations with both patient and her daughter who are agreeable  Thank you for involving me in the care of this patient.      LOS: 0 days   Sherri Sear, MD  03/01/2020, 1:19 PM   Note: This dictation was prepared with Dragon dictation along with smaller phrase technology. Any transcriptional errors that result from this process are unintentional.

## 2020-03-01 NOTE — Progress Notes (Signed)
Pharmacy Antibiotic Note  Margaret Black is a 79 y.o. female with PMH significant for significant forGERD, COPD, lung cancer s/p evaluation for radiation, hypothyroidism, SLE and stage IV chronic kidney disease who presented to the hospital for an outpatient upper and lower endoscopy after evaluation for unintentional weight loss and poor appetite. CT scan shows persistent sigmoid diverticulitis which is worse compared to 6 weeks ago. Pharmacy has been consulted for Zosyn dosing. His renal function on admission is lower than his apparent baseline level  Plan:   Start Zosyn 2.25 grams IV q8h   Height: 5\' 5"  (165.1 cm) Weight: 52.2 kg (115 lb) IBW/kg (Calculated) : 57  Temp (24hrs), Avg:98.7 F (37.1 C), Min:98.6 F (37 C), Max:98.8 F (37.1 C)  Recent Labs  Lab 03/01/20 1153  CREATININE 2.07*    Estimated Creatinine Clearance: 18.2 mL/min (A) (by C-G formula based on SCr of 2.07 mg/dL (H)).    Antimicrobials this admission: Zosyn 12/09 >>   Microbiology results: 12/09 GI panel: pending 12/09 C diff: pending 12/09 UCx: pending   Thank you for allowing pharmacy to be a part of this patient's care.  [name] 03/01/2020 1:50 PM

## 2020-03-01 NOTE — Anesthesia Preprocedure Evaluation (Signed)
Anesthesia Evaluation  Patient identified by MRN, date of birth, ID band Patient awake    Reviewed: Allergy & Precautions, H&P , NPO status , Patient's Chart, lab work & pertinent test results, reviewed documented beta blocker date and time   Airway Mallampati: II   Neck ROM: full    Dental  (+) Teeth Intact, Partial Upper   Pulmonary shortness of breath and with exertion, COPD, former smoker,    Pulmonary exam normal        Cardiovascular Exercise Tolerance: Poor hypertension, On Medications negative cardio ROS Normal cardiovascular exam Rhythm:regular Rate:Normal     Neuro/Psych Anxiety  Neuromuscular disease negative psych ROS   GI/Hepatic Neg liver ROS, GERD  Medicated,  Endo/Other  Hypothyroidism   Renal/GU Renal disease  negative genitourinary   Musculoskeletal   Abdominal   Peds  Hematology  (+) Blood dyscrasia, anemia ,   Anesthesia Other Findings Past Medical History: No date: Anxiety     Comment:  Related to HTN and vision loss No date: Central retinal vein occlusion of left eye     Comment:  legally blind in Left eye No date: Chronic kidney disease     Comment:  with allergic interstitial nephritis on biopsy 2012 No date: Collagen vascular disease ([place])     Comment:  uncharacterized.  has seen rheum.  positive RF, positive              serologies for Sjogrens.  C-ANCA/P-ANCA neg, ANA  neg,               anti-dsDNA neg, anti-SCL70 neg, anti-centromere Ab neg No date: COPD (chronic obstructive pulmonary disease) ([facility])     Comment:  quit smoking 1993 No date: Diverticulosis No date: Dysfunction of eustachian tube No date: Fatigue No date: GERD (gastroesophageal reflux disease) No date: Goiter, unspecified No date: HLD (hyperlipidemia) No date: HTN (hypertension)     Comment:  difficult to control, hypertensive urgency               hospitalizations, normal urine/plasma metanephrines and                catecholamines, renal dopplers without stenosis No date: Hyperaldosteronism     Comment:  serum aldo/renin ration elevated (aldo 23.7, renin               <0.15), CT abd 2010 with normal adrenals, dedicated               adrenal CT without adrenal adenoma,  No date: Hypothyroid No date: Insomnia     Comment:  Related to HTN and vision loss No date: Irritable bowel syndrome 09/22/2014: Leiomyosarcoma of uterus ([place]) No date: Lung cancer [facility])     Comment:  s/p R lobectomy 2001 No date: Lupus ([place]) No date: Osteopenia No date: Pulmonary hypertension ([place])     Comment:  echo [facility] 12/2008 with EF >5%, grade II diastolic               dysfunction, RSVP >73mmHg, ACE level normal.  f/u echo at              [place] - EF 55-60%, mild MR, mild LAE, PASP 25 + RA (no               pulm HTN) 2014: Thyroid cancer ([place]) No date: Urge incontinence No date: Uterine cancer ([place])     Comment:  s/p hysterectomy 2001 Past Surgical History: 06/1999: ABDOMINAL HYSTERECTOMY 1958: APPENDECTOMY 1982: CHOLECYSTECTOMY 2010:  CT ABD W & PELVIS WO CM     Comment:  neg except diverticulosis 2007: CT chest     Comment:  enlarged thyroid with goiter 04/1999: LOBECTOMY; Right     Comment:  right lung, done in [place] 2003: lung nodule excision; Left     Comment:  benign 2014: THYROIDECTOMY 1975: TUBAL LIGATION   Reproductive/Obstetrics negative OB ROS                             Anesthesia Physical Anesthesia Plan  ASA: III  Anesthesia Plan: General   Post-op Pain Management:    Induction:   PONV Risk Score and Plan:   Airway Management Planned:   Additional Equipment:   Intra-op Plan:   Post-operative Plan:   Informed Consent: I have reviewed the patients History and Physical, chart, labs and discussed the procedure including the risks, benefits and alternatives for the proposed anesthesia with the patient or authorized representative who has indicated his/her  understanding and acceptance.     Dental Advisory Given  Plan Discussed with: CRNA  Anesthesia Plan Comments:         Anesthesia Quick Evaluation

## 2020-03-01 NOTE — H&P (Signed)
History and Physical    Margaret Black OVZ:[id number] DOB: 1940/07/29 DOA: 03/01/2020  PCP: Olin Hauser, DO   Patient coming from: Home  I have personally briefly reviewed patient's old medical records in [place]  Chief Complaint: Abdominal pain  HPI: Margaret Black is a 79 y.o. female with medical history significant for significant for GERD, COPD, lung cancer status post evaluation for radiation therapy, hypothyroidism, SLE and stage IV chronic kidney disease who presented to the hospital for an outpatient upper and lower endoscopy after evaluation for unintentional weight loss and poor appetite.  Admission was requested because patient complained of worsening lower abdominal pain for 2 days.  Pain is mostly in the left lower quadrant and is associated with nausea and vomiting.  She rated her pain a 7 x 10 in intensity at its worst and states that it similar to the pain she had about 6 weeks ago when she was admitted for acute diverticulitis.  Patient states that the pain never completely resolved and she thinks is because she left the hospital too soon.  She has diarrhea and thinks that it is related to the prep she had taken for her colonoscopy.  Her daughter who was at the bedside also states that she has been on multiple rounds of antibiotics. She denies having any fever or chills, no dizziness, no palpitations, no diaphoresis, no lightheadedness, no urinary symptoms, no cough, no shortness of breath or chest pain Labs show sodium 136, potassium 3.9, chloride 96, bicarb 25, glucose 157, BUN 19, creatinine 2.07 compared to baseline of 1.24, calcium 8.8 Her coronavirus PCR test is negative. CT scan of abdomen and pelvis done without contrast showed  persistent evidence of diverticulitis at the proximal sigmoid colon, with regional inflammatory changes that appear worse compared with the study of 01/07/2020. No evidence of drainable abscess. Few mildly dilated small bowel loops  in that region could represent a localized ileus secondary to the inflammatory changes. Considerable amount of intestinal fluid presumably related to the colon prep. Aortic atherosclerosis. Coronary artery calcification. No change in a 12 mm by 4 mm pulmonary nodule in the left lower lobe.    ED Course: N/A  Review of Systems: As per HPI otherwise 10 point review of systems negative.    Past Medical History:  Diagnosis Date  . Anxiety    Related to HTN and vision loss  . Central retinal vein occlusion of left eye    legally blind in Left eye  . 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 Collagen vascular disease ([place])    uncharacterized.  has seen rheum.  positive RF, positive serologies for Sjogrens.  C-ANCA/P-ANCA neg, ANA  neg, anti-dsDNA neg, anti-SCL70 neg, anti-centromere Ab neg  . COPD (chronic obstructive pulmonary disease) ([place])    quit smoking 1993  . Diverticulosis   . Dysfunction of eustachian tube   . Fatigue   . GERD (gastroesophageal reflux disease)   . Goiter, unspecified   . HLD (hyperlipidemia)   . HTN (hypertension)    difficult to control, hypertensive urgency hospitalizations, normal urine/plasma metanephrines and catecholamines, renal dopplers without stenosis  . Hyperaldosteronism    serum aldo/renin ration elevated (aldo 23.7, renin <0.15), CT abd 2010 with normal adrenals, dedicated adrenal CT without adrenal adenoma,   . Hypothyroid   . Insomnia    Related to HTN and vision loss  . Irritable bowel syndrome   . Leiomyosarcoma of uterus (  [place]) 09/22/2014  . Lung cancer [facility])    s/p R lobectomy 2001  . Lupus ([place])   . Osteopenia   . Pulmonary hypertension ([place])    echo [facility] 12/2008 with EF >5%, grade II diastolic dysfunction, RSVP >97mmHg, ACE level normal.  f/u echo at [place] - EF 55-60%, mild MR, mild LAE, PASP 25 + RA (no pulm HTN)  . Thyroid cancer ([place]) 2014  . Urge incontinence   . Uterine cancer [facility])     s/p hysterectomy 2001    Past Surgical History:  Procedure Laterality Date  . ABDOMINAL HYSTERECTOMY  06/1999  . APPENDECTOMY  1958  . CHOLECYSTECTOMY  1982  . CT ABD W & PELVIS WO CM  2010   neg except diverticulosis  . CT chest  2007   enlarged thyroid with goiter  . LOBECTOMY Right 04/1999   right lung, done in [place]  . lung nodule excision Left 2003   benign  . THYROIDECTOMY  2014  . TUBAL LIGATION  1975     reports that she quit smoking about 28 years ago. Her smoking use included cigarettes. She has quit using smokeless tobacco. She reports that she does not drink alcohol and does not use drugs.  Allergies  Allergen Reactions  . Lactase Diarrhea  . Atenolol Rash  . Latex Rash    Family History  Problem Relation Age of Onset  . Hypertension Mother   . Pneumonia Father   . Alcohol abuse Father      Prior to Admission medications   Medication Sig Start Date End Date Taking? Authorizing Provider  BYSTOLIC 5 MG tablet Take 1 tablet (5 mg total) by mouth daily. 08/23/19  Yes Karamalegos, Devonne Doughty, DO  hydrocortisone (ANUSOL-HC) 2.5 % rectal cream Place rectally 3 (three) times daily. 01/09/20  Yes Loletha Grayer, MD  lansoprazole (PREVACID) 15 MG capsule Take 1 capsule (15 mg total) by mouth daily before breakfast. 01/27/20  Yes Karamalegos, Devonne Doughty, DO  LORazepam (ATIVAN) 0.5 MG tablet Take 1 tablet (0.5 mg total) by mouth at bedtime. 10/25/19  Yes Karamalegos, Alexander J, DO  ondansetron (ZOFRAN ODT) 4 MG disintegrating tablet Take 1 tablet (4 mg total) by mouth every 8 (eight) hours as needed for nausea or vomiting. 01/30/20  Yes Karamalegos, Devonne Doughty, DO  spironolactone (ALDACTONE) 25 MG tablet Hold this medication until followup with your outpatient doctor due to dehydration. 01/22/20  Yes Enzo Bi, MD  SYNTHROID 88 MCG tablet Take 1 tablet (88 mcg total) by mouth daily before breakfast. 09/15/19  Yes Karamalegos, Devonne Doughty, DO  acidophilus (RISAQUAD) CAPS  capsule 2 capsules daily (okay to substitute any probiotic) Patient not taking: Reported on 03/01/2020 01/09/20   Loletha Grayer, MD  CALCIUM-VITAMIN D PO Take 1 tablet by mouth daily.  Patient not taking: Reported on 02/07/2020    [provider]  Multiple Vitamin (MULTIVITAMIN) tablet Take 1 tablet by mouth daily.  Patient not taking: Reported on 02/07/2020    [provider]  NIFEdipine (PROCARDIA XL/NIFEDICAL XL) 60 MG 24 hr tablet TAKE 1 TABLET EVERY EVENING Patient taking differently: Take 60 mg by mouth every evening. TAKE 1 TABLET EVERY EVENING 11/11/19   Karamalegos, Devonne Doughty, DO  sucralfate (CARAFATE) 1 g tablet Take 1 tablet (1 g total) by mouth 4 (four) times daily -  with meals and at bedtime. Patient not taking: Reported on 02/07/2020 01/30/20   Olin Hauser, DO  vitamin C (ASCORBIC ACID) 500 MG tablet Take 500  mg by mouth daily.  Patient not taking: Reported on 02/07/2020    [provider]    Physical Exam: Vitals:   03/01/20 1007  BP: (!) 143/63  Pulse: 73  Resp: 18  Temp: 98.6 F (37 C)  SpO2: 100%  Weight: 52.2 kg  Height: 5\' 5"  (1.651 m)     Vitals:   03/01/20 1007  BP: (!) 143/63  Pulse: 73  Resp: 18  Temp: 98.6 F (37 C)  SpO2: 100%  Weight: 52.2 kg  Height: 5\' 5"  (1.651 m)    Constitutional: NAD, alert and oriented x 3.  Appears very weak and frail. Eyes: PERRL, lids and conjunctivae pallor ENMT: Mucous membranes are dry.  Neck: normal, supple, no masses, no thyromegaly Respiratory: clear to auscultation bilaterally, no wheezing, no crackles. Normal respiratory effort. No accessory muscle use.  Cardiovascular: Regular rate and rhythm, no murmurs / rubs / gallops. No extremity edema. 2+ pedal pulses. No carotid bruits.  Abdomen: Suprapubic and left lower quadrant tenderness, no masses palpated. No hepatosplenomegaly. Bowel sounds positive.  Musculoskeletal: no clubbing / cyanosis. No joint deformity upper  and lower extremities.  Skin: Scattered macular rashes over the anterior chest wall and upper extremities, lesions, ulcers.  Neurologic: No gross focal neurologic deficit.  Generalized weakness Psychiatric: Normal mood and affect.   Labs on Admission: I have personally reviewed following labs and imaging studies  CBC: No results for input(s): WBC, NEUTROABS, HGB, HCT, MCV, PLT in the last 168 hours. Basic Metabolic Panel: Recent Labs  Lab 03/01/20 1153  NA 136  K 3.9  CL 96*  CO2 25  GLUCOSE 157*  BUN 19  CREATININE 2.07*  CALCIUM 8.8*   GFR: Estimated Creatinine Clearance: 18.2 mL/min (A) (by C-G formula based on SCr of 2.07 mg/dL (H)). Liver Function Tests: No results for input(s): AST, ALT, ALKPHOS, BILITOT, PROT, ALBUMIN in the last 168 hours. No results for input(s): LIPASE, AMYLASE in the last 168 hours. No results for input(s): AMMONIA in the last 168 hours. Coagulation Profile: No results for input(s): INR, PROTIME in the last 168 hours. Cardiac Enzymes: No results for input(s): CKTOTAL, CKMB, CKMBINDEX, TROPONINI in the last 168 hours. BNP (last 3 results) No results for input(s): PROBNP in the last 8760 hours. HbA1C: No results for input(s): HGBA1C in the last 72 hours. CBG: No results for input(s): GLUCAP in the last 168 hours. Lipid Profile: No results for input(s): CHOL, HDL, LDLCALC, TRIG, CHOLHDL, LDLDIRECT in the last 72 hours. Thyroid Function Tests: No results for input(s): TSH, T4TOTAL, FREET4, T3FREE, THYROIDAB in the last 72 hours. Anemia Panel: No results for input(s): VITAMINB12, FOLATE, FERRITIN, TIBC, IRON, RETICCTPCT in the last 72 hours. Urine analysis:    Component Value Date/Time   COLORURINE STRAW (A) 01/21/2020 2127   APPEARANCEUR CLEAR (A) 01/21/2020 2127   LABSPEC 1.001 (L) 01/21/2020 2127   PHURINE 5.0 01/21/2020 2127   GLUCOSEU NEGATIVE 01/21/2020 2127   HGBUR NEGATIVE 01/21/2020 2127   HGBUR moderate 06/28/2007 1131    BILIRUBINUR NEGATIVE 01/21/2020 2127   BILIRUBINUR negative 02/03/2018 [place] 01/21/2020 2127   PROTEINUR NEGATIVE 01/21/2020 2127   UROBILINOGEN 0.2 02/03/2018 1148   UROBILINOGEN 0.2 09/29/2011 2001   NITRITE NEGATIVE 01/21/2020 2127   LEUKOCYTESUR NEGATIVE 01/21/2020 2127    Radiological Exams on Admission: CT ABDOMEN PELVIS WO CONTRAST  Result Date: 03/01/2020 CLINICAL DATA:  Left lower quadrant pain. Clinical suspicion of diverticulitis. Patient scheduled for colonoscopy today. EXAM: CT ABDOMEN  AND PELVIS WITHOUT CONTRAST TECHNIQUE: Multidetector CT imaging of the abdomen and pelvis was performed following the standard protocol without IV contrast. COMPARISON:  01/07/2020 FINDINGS: Lower chest: No change in a 12 mm by 4 mm pulmonary nodule in the left lower lobe. Areas of scarring at the lung bases are unchanged. Coronary artery calcification and aortic atherosclerotic calcification are noted. Hepatobiliary: Previous cholecystectomy. Cyst in the right lobe of the liver as seen previously. No acute liver finding. Pancreas: Normal Spleen: Normal Adrenals/Urinary Tract: Adrenal glands are normal. Kidneys are normal. No cyst, mass, stone or hydronephrosis. Bladder is normal. Stomach/Bowel: Considerable amount of intestinal fluid presumably related to the colon prep. There is persistent evidence diverticulitis at the proximal sigmoid colon, with regional inflammatory changes that appear worse compared with the study of 01/07/2020. Few mildly dilated small bowel loops in that region could represent a localized ileus secondary to the inflammatory changes. No evidence of drainable abscess. No free intraperitoneal fluid or air. Vascular/Lymphatic: Aortic atherosclerosis. No aneurysm. IVC is normal. Aortic branch vessel atherosclerosis. No adenopathy. Reproductive: Previous hysterectomy.  No pelvic mass. Other: None Musculoskeletal: Ordinary lower lumbar degenerative changes. IMPRESSION:  1. Persistent evidence of diverticulitis at the proximal sigmoid colon, with regional inflammatory changes that appear worse compared with the study of 01/07/2020. No evidence of drainable abscess. Few mildly dilated small bowel loops in that region could represent a localized ileus secondary to the inflammatory changes. 2. Considerable amount of intestinal fluid presumably related to the colon prep. 3. Aortic atherosclerosis. Coronary artery calcification. 4. No change in a 12 mm by 4 mm pulmonary nodule in the left lower lobe. Aortic Atherosclerosis (ICD10-I70.0). Electronically Signed   By: Nelson Chimes M.D.   On: 03/01/2020 11:29    EKG: Independently reviewed.   Assessment/Plan Principal Problem:   Acute diverticulitis Active Problems:   GERD   Acute kidney injury superimposed on CKD ([place])   Essential hypertension   Anemia in chronic kidney disease     Acute diverticulitis Recurrent Patient was hospitalized about 6 weeks ago for sigmoid diverticulitis and was discharged home on oral antibiotics She presents today for evaluation of worsening abdominal pain and CT scan shows persistent sigmoid diverticulitis which is worse compared to 6 weeks ago We will start patient on antibiotic therapy with Zosyn Send stool studies We will request surgical consult    Acute on chronic kidney injury Patient has a history of stage III-IV chronic kidney disease with baseline serum creatinine of 1.24.  Her serum creatinine today is 2.07 Worsening renal function is secondary to poor oral intake as well as concomitant diuretic use Hold spironolactone Gentle IV fluid hydration Repeat renal parameters in a.m.   Hypertension Continue Bystolic and nifedipine   Hypothyroidism Continue Synthroid   GERD Continue Protonix   DVT prophylaxis: Lovenox Code Status: DO NOT RESUSCITATE Family Communication: Greater than 50% of time was spent discussing patient's condition and plan of care with her  and her daughter at the bedside.  All questions and concerns have been addressed.  They verbalized understanding and agree with the plan.  CODE STATUS was discussed and she is a DO NOT RESUSCITATE. Disposition Plan: Back to previous home environment Consults called: GI/surgery    Collier Bullock MD [facility]     03/01/2020, 1:30 PM

## 2020-03-02 ENCOUNTER — Encounter: Payer: Self-pay | Admitting: Gastroenterology

## 2020-03-02 DIAGNOSIS — K219 Gastro-esophageal reflux disease without esophagitis: Secondary | ICD-10-CM

## 2020-03-02 DIAGNOSIS — I10 Essential (primary) hypertension: Secondary | ICD-10-CM

## 2020-03-02 LAB — BASIC METABOLIC PANEL
Anion gap: 14 (ref 5–15)
BUN: 20 mg/dL (ref 8–23)
CO2: 23 mmol/L (ref 22–32)
Calcium: 8.2 mg/dL — ABNORMAL LOW (ref 8.9–10.3)
Chloride: 98 mmol/L (ref 98–111)
Creatinine, Ser: 1.91 mg/dL — ABNORMAL HIGH (ref 0.44–1.00)
GFR, Estimated: 26 mL/min — ABNORMAL LOW (ref >60)
Glucose, Bld: 141 mg/dL — ABNORMAL HIGH (ref 70–99)
Potassium: 3.3 mmol/L — ABNORMAL LOW (ref 3.5–5.1)
Sodium: 135 mmol/L (ref 135–145)

## 2020-03-02 LAB — CBC
HCT: 32.3 % — ABNORMAL LOW (ref 36.0–46.0)
Hemoglobin: 11 g/dL — ABNORMAL LOW (ref 12.0–15.0)
MCH: 32.4 pg (ref 26.0–34.0)
MCHC: 34.1 g/dL (ref 30.0–36.0)
MCV: 95.3 fL (ref 80.0–100.0)
Platelets: 273 10*3/uL (ref 150–400)
RBC: 3.39 MIL/uL — ABNORMAL LOW (ref 3.87–5.11)
RDW: 13.7 % (ref 11.5–15.5)
WBC: 9.6 10*3/uL (ref 4.0–10.5)
nRBC: 0 % (ref 0.0–0.2)

## 2020-03-02 MED ORDER — ALPRAZOLAM 0.5 MG PO TABS
0.2500 mg | ORAL_TABLET | Freq: Three times a day (TID) | ORAL | Status: DC | PRN
  Administered 2020-03-02 – 2020-03-15 (×19): 0.25 mg via ORAL
  Filled 2020-03-02 (×19): qty 1

## 2020-03-02 MED ORDER — ACETAMINOPHEN 325 MG PO TABS
650.0000 mg | ORAL_TABLET | Freq: Four times a day (QID) | ORAL | Status: DC | PRN
  Administered 2020-03-02: 650 mg via ORAL
  Administered 2020-03-08: 325 mg via ORAL
  Filled 2020-03-02 (×2): qty 2

## 2020-03-02 NOTE — Consult Note (Addendum)
Subjective:   CC: Diverticulitis  HPI:  Margaret Black is a 79 y.o. female who is consulted by [facility] for evaluation of  above cc.  Symptoms were first noted several days ago. Pain is crampy associated with diarrhea, exacerbated by nothing specific.     Past Medical History:  has a past medical history of Anxiety, Central retinal vein occlusion of left eye, Chronic kidney disease, Collagen vascular disease ([place]), COPD (chronic obstructive pulmonary disease) ([place]), Diverticulosis, Dysfunction of eustachian tube, Fatigue, GERD (gastroesophageal reflux disease), Goiter, unspecified, HLD (hyperlipidemia), HTN (hypertension), Hyperaldosteronism, Hypothyroid, Insomnia, Irritable bowel syndrome, Leiomyosarcoma of uterus ([place]) (09/22/2014), Lung cancer ([place]), Lupus ([place]), Osteopenia, Pulmonary hypertension ([place]), Thyroid cancer ([place]) (2014), Urge incontinence, and Uterine cancer ([place]).  Past Surgical History:  has a past surgical history that includes Lobectomy (Right, 04/1999); Cholecystectomy (1982); Tubal ligation (1975); Appendectomy (1958); CT chest (2007); CT ABD W & PELVIS WO CM (2010); Thyroidectomy (2014); Abdominal hysterectomy (06/1999); and lung nodule excision (Left, 2003).  Family History: family history includes Alcohol abuse in her father; Hypertension in her mother; Pneumonia in her father.  Social History:  reports that she quit smoking about 28 years ago. Her smoking use included cigarettes. She has quit using smokeless tobacco. She reports that she does not drink alcohol and does not use drugs.  Current Medications:  Prior to Admission medications   Medication Sig Start Date End Date Taking? Authorizing Provider  BYSTOLIC 5 MG tablet Take 1 tablet (5 mg total) by mouth daily. 08/23/19  Yes Karamalegos, Devonne Doughty, DO  lansoprazole (PREVACID) 15 MG capsule Take 1 capsule (15 mg total) by mouth daily before breakfast. 01/27/20  Yes Karamalegos, Devonne Doughty, DO  LORazepam (ATIVAN) 0.5 MG tablet  Take 1 tablet (0.5 mg total) by mouth at bedtime. 10/25/19  Yes Karamalegos, Alexander J, DO  ondansetron (ZOFRAN ODT) 4 MG disintegrating tablet Take 1 tablet (4 mg total) by mouth every 8 (eight) hours as needed for nausea or vomiting. 01/30/20  Yes Karamalegos, Devonne Doughty, DO  spironolactone (ALDACTONE) 25 MG tablet Hold this medication until followup with your outpatient doctor due to dehydration. 01/22/20  Yes Enzo Bi, MD  SYNTHROID 88 MCG tablet Take 1 tablet (88 mcg total) by mouth daily before breakfast. 09/15/19  Yes Karamalegos, Devonne Doughty, DO  acidophilus (RISAQUAD) CAPS capsule 2 capsules daily (okay to substitute any probiotic) Patient not taking: Reported on 03/01/2020 01/09/20   Loletha Grayer, MD  CALCIUM-VITAMIN D PO Take 1 tablet by mouth daily.  Patient not taking: Reported on 02/07/2020    [provider]  enalapril (VASOTEC) 5 MG tablet Take 5 mg by mouth at bedtime.    [provider]  hydrocortisone (ANUSOL-HC) 2.5 % rectal cream Place rectally 3 (three) times daily. Patient not taking: Reported on 03/01/2020 01/09/20   Loletha Grayer, MD  Multiple Vitamin (MULTIVITAMIN) tablet Take 1 tablet by mouth daily.  Patient not taking: Reported on 02/07/2020    [provider]  NIFEdipine (PROCARDIA XL/NIFEDICAL XL) 60 MG 24 hr tablet TAKE 1 TABLET EVERY EVENING Patient taking differently: Take 60 mg by mouth in the morning. TAKE 1 TABLET EVERY EVENING 11/11/19   Karamalegos, Devonne Doughty, DO  sucralfate (CARAFATE) 1 g tablet Take 1 tablet (1 g total) by mouth 4 (four) times daily -  with meals and at bedtime. Patient not taking: Reported on 02/07/2020 01/30/20   Olin Hauser, DO  vitamin C (ASCORBIC ACID) 500 MG tablet Take 500 mg by mouth daily.  Patient not taking: Reported on 02/07/2020    [provider]    Allergies:  Allergies as of 02/20/2020 - Review Complete 02/07/2020  Allergen Reaction Noted  . Lactase Diarrhea  09/19/2014  . Atenolol Rash 08/12/2019  . Latex Rash 11/20/2015    ROS:  General: Denies weight loss, weight gain, fatigue, fevers, chills, and night sweats. Eyes: Denies blurry vision, double vision, eye pain, itchy eyes, and tearing. Ears: Denies hearing loss, earache, and ringing in ears. Nose: Denies sinus pain, congestion, infections, runny nose, and nosebleeds. Mouth/throat: Denies hoarseness, sore throat, bleeding gums, and difficulty swallowing. Heart: Denies chest pain, palpitations, racing heart, irregular heartbeat, leg pain or swelling, and decreased activity tolerance. Respiratory: Denies breathing difficulty, shortness of breath, wheezing, cough, and sputum. GI: Denies change in appetite, heartburn, nausea, vomiting, constipation, diarrhea, and blood in stool. GU: Denies difficulty urinating, pain with urinating, urgency, frequency, blood in urine. Musculoskeletal: Denies joint stiffness, pain, swelling, muscle weakness. Skin: Denies rash, itching, mass, tumors, sores, and boils Neurologic: Denies headache, fainting, dizziness, seizures, numbness, and tingling. Psychiatric: Denies depression, anxiety, difficulty sleeping, and memory loss. Endocrine: Denies heat or cold intolerance, and increased thirst or urination. Blood/lymph: Denies easy bruising, easy bruising, and swollen glands     Objective:     VSS   Constitutional :  alert, cooperative, appears stated age and mild distress  Lymphatics/Throat:  no asymmetry, masses, or scars  Respiratory:  clear to auscultation bilaterally  Cardiovascular:  regular rate and rhythm  Gastrointestinal: Soft, no guarding, focal tenderness to palpation in suprapubic left lower quadrant area no other tenderness noted in other quadrant.   Musculoskeletal: Steady gait and movement  Skin: Cool and moist  Psychiatric: Normal affect, non-agitated, not confused       LABS:  No leukocytosis, increased creatinine.  RADS: CLINICAL  DATA:  Left lower quadrant pain. Clinical suspicion of diverticulitis. Patient scheduled for colonoscopy today.  EXAM: CT ABDOMEN AND PELVIS WITHOUT CONTRAST  TECHNIQUE: Multidetector CT imaging of the abdomen and pelvis was performed following the standard protocol without IV contrast.  COMPARISON:  01/07/2020  FINDINGS: Lower chest: No change in a 12 mm by 4 mm pulmonary nodule in the left lower lobe. Areas of scarring at the lung bases are unchanged. Coronary artery calcification and aortic atherosclerotic calcification are noted.  Hepatobiliary: Previous cholecystectomy. Cyst in the right lobe of the liver as seen previously. No acute liver finding.  Pancreas: Normal  Spleen: Normal  Adrenals/Urinary Tract: Adrenal glands are normal. Kidneys are normal. No cyst, mass, stone or hydronephrosis. Bladder is normal.  Stomach/Bowel: Considerable amount of intestinal fluid presumably related to the colon prep. There is persistent evidence diverticulitis at the proximal sigmoid colon, with regional inflammatory changes that appear worse compared with the study of 01/07/2020. Few mildly dilated small bowel loops in that region could represent a localized ileus secondary to the inflammatory changes. No evidence of drainable abscess. No free intraperitoneal fluid or air.  Vascular/Lymphatic: Aortic atherosclerosis. No aneurysm. IVC is normal. Aortic branch vessel atherosclerosis. No adenopathy.  Reproductive: Previous hysterectomy.  No pelvic mass.  Other: None  Musculoskeletal: Ordinary lower lumbar degenerative changes.  IMPRESSION: 1. Persistent evidence of diverticulitis at the proximal sigmoid colon, with regional inflammatory changes that appear worse compared with the study of 01/07/2020. No evidence of drainable abscess. Few mildly dilated small bowel loops in that region could represent a localized ileus secondary to the inflammatory changes. 2.  Considerable amount of intestinal fluid presumably related to  the colon prep. 3. Aortic atherosclerosis. Coronary artery calcification. 4. No change in a 12 mm by 4 mm pulmonary nodule in the left lower lobe.  Aortic Atherosclerosis (ICD10-I70.0).   Electronically Signed   By: Nelson Chimes M.D.   On: 03/01/2020 11:29 Assessment:      Diverticulitis, recurrent versus chronic this will be her third trial of antibiotics, second hospitalization.  Plan:     Discussed with the patient briefly that after several failed antibiotics attempts, typical recommendation is to proceed with surgical resection of the colon.  Surgery of this type of thing resulted in a temporary colostomy due to the extensive inflammation making primary anastomosis higher risk.  Patient adamantly refusing a colostomy placement at this time.  Fortunately, patient does not have any leukocytosis, vital signs are stable, and abdominal exam does not show any sign of overt peritonitis.  We agreed upon at another trial of antibiotics in hopes of resolving this episode, and then considering elective colon resection or a primary reanastomosis will have a higher chance of success.  I still did reiterate that if this acute episode does not resolve with antibiotics alone, we may still need to proceed with a Hartman's procedure.  Patient verbalized understanding all questions addressed at this time.  Continue IV antibiotics, IV fluids.  Switching back to n.p.o. due to the persistent pain reported despite initial administration of pain medication.

## 2020-03-02 NOTE — Progress Notes (Signed)
[name], female   DOB: Feb 17, 1941, 79 y.o.   MRN: [id number] [facility] PROGRESS NOTE  Margaret Black [id number]:[id number] DOB: Oct 16, 1940 DOA: 03/01/2020 PCP: Olin Hauser, DO  HPI/Subjective: Patient feeling that she really did not get better from the last time she was in the hospital.  She has a lot of abdominal pain.  No nausea or vomiting.  Objective: Vitals:   03/02/20 0900 03/02/20 1220  BP: (!) 132/48 (!) 110/44  Pulse: 82 89  Resp: 20 18  Temp: 98.9 F (37.2 C) 98.4 F (36.9 C)  SpO2: 95% 94%    Intake/Output Summary (Last 24 hours) at 03/02/2020 1400 Last data filed at 03/02/2020 0220 Gross per 24 hour  Intake 220.56 ml  Output 300 ml  Net -79.44 ml   Filed Weights   03/01/20 1007  Weight: 52.2 kg    ROS: Review of Systems  Respiratory: Negative for cough and shortness of breath.   Cardiovascular: Negative for chest pain.  Gastrointestinal: Positive for abdominal pain. Negative for nausea and vomiting.   Exam: Physical Exam HENT:     Head: Normocephalic.     Mouth/Throat:     Pharynx: No oropharyngeal exudate.  Eyes:     General: Lids are normal.     Conjunctiva/sclera: Conjunctivae normal.     Pupils: Pupils are equal, round, and reactive to light.  Cardiovascular:     Rate and Rhythm: Normal rate and regular rhythm.     Heart sounds: Normal heart sounds, S1 normal and S2 normal.  Pulmonary:     Breath sounds: No decreased breath sounds, wheezing, rhonchi or rales.  Abdominal:     Palpations: Abdomen is soft.     Tenderness: There is generalized abdominal tenderness.  Musculoskeletal:     Right ankle: No swelling.     Left ankle: No swelling.  Skin:    General: Skin is warm.     Findings: No rash.  Neurological:     Mental Status: She is alert and oriented to person, place, and time.       Data Reviewed: Basic Metabolic Panel: Recent Labs  Lab 03/01/20 1153 03/02/20 0638  NA 136 135  K 3.9 3.3*  CL 96* 98   CO2 25 23  GLUCOSE 157* 141*  BUN 19 20  CREATININE 2.07* 1.91*  CALCIUM 8.8* 8.2*   CBC: Recent Labs  Lab 03/01/20 1407 03/02/20 0638  WBC 10.1 9.6  NEUTROABS 9.3*  --   HGB 10.4* 11.0*  HCT 30.0* 32.3*  MCV 92.3 95.3  PLT 298 273     Recent Results (from the past 240 hour(s))  SARS CORONAVIRUS 2 (TAT 6-24 HRS) Nasopharyngeal Nasopharyngeal Swab     Status: None   Collection Time: 02/28/20 10:26 AM   Specimen: Nasopharyngeal Swab  Result Value Ref Range Status   SARS Coronavirus 2 NEGATIVE NEGATIVE Final    Comment: (NOTE) SARS-CoV-2 target nucleic acids are NOT DETECTED.  The SARS-CoV-2 RNA is generally detectable in upper and lower respiratory specimens during the acute phase of infection. Negative results do not preclude SARS-CoV-2 infection, do not rule out co-infections with other pathogens, and should not be used as the sole basis for treatment or other patient management decisions. Negative results must be combined with clinical observations, patient history, and epidemiological information. The expected result is Negative.  Fact Sheet for Patients: SugarRoll.be  Fact Sheet for Healthcare Providers: https://www.woods-mathews.com/  This test is not yet approved or cleared by the  [place] Architectural technologist and  has been authorized for detection and/or diagnosis of SARS-CoV-2 by FDA under an Print production planner (EUA). This EUA will remain  in effect (meaning this test can be used) for the duration of the COVID-19 declaration under Se ction 564(b)(1) of the Act, 21 U.S.C. section 360bbb-3(b)(1), unless the authorization is terminated or revoked sooner.  Performed at [facility], [place] [address], [place], [place] [address]      Studies: CT ABDOMEN PELVIS WO CONTRAST  Result Date: 03/01/2020 CLINICAL DATA:  Left lower quadrant pain. Clinical suspicion of diverticulitis. Patient scheduled for colonoscopy  today. EXAM: CT ABDOMEN AND PELVIS WITHOUT CONTRAST TECHNIQUE: Multidetector CT imaging of the abdomen and pelvis was performed following the standard protocol without IV contrast. COMPARISON:  01/07/2020 FINDINGS: Lower chest: No change in a 12 mm by 4 mm pulmonary nodule in the left lower lobe. Areas of scarring at the lung bases are unchanged. Coronary artery calcification and aortic atherosclerotic calcification are noted. Hepatobiliary: Previous cholecystectomy. Cyst in the right lobe of the liver as seen previously. No acute liver finding. Pancreas: Normal Spleen: Normal Adrenals/Urinary Tract: Adrenal glands are normal. Kidneys are normal. No cyst, mass, stone or hydronephrosis. Bladder is normal. Stomach/Bowel: Considerable amount of intestinal fluid presumably related to the colon prep. There is persistent evidence diverticulitis at the proximal sigmoid colon, with regional inflammatory changes that appear worse compared with the study of 01/07/2020. Few mildly dilated small bowel loops in that region could represent a localized ileus secondary to the inflammatory changes. No evidence of drainable abscess. No free intraperitoneal fluid or air. Vascular/Lymphatic: Aortic atherosclerosis. No aneurysm. IVC is normal. Aortic branch vessel atherosclerosis. No adenopathy. Reproductive: Previous hysterectomy.  No pelvic mass. Other: None Musculoskeletal: Ordinary lower lumbar degenerative changes. IMPRESSION: 1. Persistent evidence of diverticulitis at the proximal sigmoid colon, with regional inflammatory changes that appear worse compared with the study of 01/07/2020. No evidence of drainable abscess. Few mildly dilated small bowel loops in that region could represent a localized ileus secondary to the inflammatory changes. 2. Considerable amount of intestinal fluid presumably related to the colon prep. 3. Aortic atherosclerosis. Coronary artery calcification. 4. No change in a 12 mm by 4 mm pulmonary nodule in  the left lower lobe. Aortic Atherosclerosis (ICD10-I70.0). Electronically Signed   By: Nelson Chimes M.D.   On: 03/01/2020 11:29    Scheduled Meds: . levothyroxine  88 mcg Oral Q0600  . LORazepam  0.5 mg Oral QHS  . nebivolol  5 mg Oral Q2000  . NIFEdipine  60 mg Oral Q2000  . pantoprazole  20 mg Oral Daily   Continuous Infusions: . sodium chloride 100 mL/hr at 03/02/20 0219  . piperacillin-tazobactam (ZOSYN)  IV 2.25 g (03/02/20 0428)    Assessment/Plan:  1. Acute recurrent diverticulitis.  Continue IV Zosyn.  Appreciate surgical follow-up.  Still exquisitely tender in the abdomen.  General surgery recommends n.p.o. 2. Acute kidney injury on chronic kidney disease stage IIIb.  IV fluid hydration.  Creatinine on presentation was 2.07 and previous creatinine was 1.24. 3. Central hypertension on Bystolic and nifedipine 4. Hypothyroidism unspecified on Synthroid 5. GERD on Protonix 6. History of lupus 7. Anemia chronic kidney disease 8. Weight loss     Code Status:     Code Status Orders  (From admission, onward)         Start     Ordered   03/01/20 1309  Do not attempt resuscitation (DNR)  Continuous  Question Answer Comment  In the event of cardiac or respiratory ARREST Do not call a "code blue"   In the event of cardiac or respiratory ARREST Do not perform Intubation, CPR, defibrillation or ACLS   In the event of cardiac or respiratory ARREST Use medication by any route, position, wound care, and other measures to relive pain and suffering. May use oxygen, suction and manual treatment of airway obstruction as needed for comfort.   Comments CODE STATUS was discussed with patient who wishes to be placed on a DO NOT RESUSCITATE status      03/01/20 1311        Code Status History    Date Active Date Inactive Code Status Order ID Comments User Context   01/21/2020 2225 01/22/2020 1749 DNR [id number]  Lenore Cordia, MD ED   01/07/2020 [phone] 01/09/2020 1809 DNR  [id number]  Collier Bullock, MD ED   Advance Care Planning Activity    Advance Directive Documentation   Flowsheet Row Most Recent Value  Type of Advance Directive Healthcare Power of [place], Living will  Pre-existing out of facility DNR order (yellow form or pink MOST form) --  "MOST" Form in Place? --     Family Communication: Updated husband on the phone Disposition Plan: Status is: Inpatient  Dispo: The patient is from: Home              Anticipated d/c is to: Home              Anticipated d/c date is: Likely will need 3 or 4 days at least of IV antibiotics              Patient currently being treated for recurrent diverticulitis  Consultants:  General surgery  Antibiotics:  Zosyn  Time spent: 28 minutes  [place]

## 2020-03-02 NOTE — Plan of Care (Signed)
Pt Axox4. Calm and cooperative and able to voice her needs. No complaint of pain nor nausea overnight. Pt able to take pills whole. She ambulates to restroom without problem. Low grade temp reported at change of shift, resolved.  Safety measures in place. Will continue to monitor.  Problem: Education: Goal: Knowledge of General Education information will improve Description: Including pain rating scale, medication(s)/side effects and non-pharmacologic comfort measures Outcome: Progressing   Problem: Health Behavior/Discharge Planning: Goal: Ability to manage health-related needs will improve Outcome: Progressing   Problem: Clinical Measurements: Goal: Ability to maintain clinical measurements within normal limits will improve Outcome: Progressing Goal: Will remain free from infection Outcome: Progressing Goal: Diagnostic test results will improve Outcome: Progressing Goal: Respiratory complications will improve Outcome: Progressing Goal: Cardiovascular complication will be avoided Outcome: Progressing   Problem: Activity: Goal: Risk for activity intolerance will decrease Outcome: Progressing   Problem: Nutrition: Goal: Adequate nutrition will be maintained Outcome: Progressing   Problem: Coping: Goal: Level of anxiety will decrease Outcome: Progressing   Problem: Coping: Goal: Level of anxiety will decrease Outcome: Progressing   Problem: Elimination: Goal: Will not experience complications related to bowel motility Outcome: Progressing Goal: Will not experience complications related to urinary retention Outcome: Progressing   Problem: Pain Managment: Goal: General experience of comfort will improve Outcome: Progressing   Problem: Safety: Goal: Ability to remain free from injury will improve Outcome: Progressing   Problem: Skin Integrity: Goal: Risk for impaired skin integrity will decrease Outcome: Progressing

## 2020-03-02 NOTE — Progress Notes (Signed)
OT Cancellation Note  Patient Details Name: Margaret Black MRN: [id number] DOB: [birth year]   Cancelled Treatment:    Reason Eval/Treat Not Completed: Fatigue/lethargy limiting ability to participate;Medical issues which prohibited therapy. Order received and chart reviewed. Pt noted with soft BP (most recent available reading in chart 110/44). Spoke with RN upon arrival to unit who was in room with pt. RN asks pt if she is feeling up to therapy this date, and pt requests therapist re-attempt at a later time/date as she is "not feeling up to it" todayl. In consideration of soft BP and pt fatigue, will hold OT evaluation at this time and re-attempt next date as available and pt medically appropriate for OT evaluation.   Shara Blazing, M.S., OTR/L Ascom: [phone] 03/02/20, 2:56 PM

## 2020-03-02 NOTE — Progress Notes (Signed)
Initial Nutrition Assessment  DOCUMENTATION CODES:   Severe malnutrition in context of chronic illness  INTERVENTION:  Diet advancement per GI  Once diet advanced to clears, will order  -Boost Breeze po TID, each supplement provides 250 kcal and 9 grams of protein  Once diet advanced to full liquids, will order -Anda [employer] 1.4 (vanilla) po daily, each supplement provides 455 kcal and 20 grams of protein  Education provided  With diet advancement, monitor magnesium, potassium, and phosphorus daily for at least 3 days, MD to replete as needed, as pt is at risk for refeeding syndrome as noted below   NUTRITION DIAGNOSIS:   Severe Malnutrition related to chronic illness as evidenced by moderate fat depletion,severe fat depletion,mild muscle depletion,moderate muscle depletion,per patient/family report,energy intake < or equal to 75% for > or equal to 1 month,percent weight loss.    GOAL:   Patient will meet greater than or equal to 90% of their needs    MONITOR:   Labs,I & O's,Diet advancement,Supplement acceptance,Weight trends,PO intake  REASON FOR ASSESSMENT:   Malnutrition Screening Tool    ASSESSMENT:   79 year old female with medical history significant for GERD, HLD, HTN, collagen vascular disease, COPD, lung cancer s/p lobectomy 2001,uternie cancer s/p hysterectomy 2001, osteopenia, hyperaldosteronism, Lupus, IBS, retinal vein occulusion of left eye,  hypothyroidism, SLE, CKD stage IV, and grade II diastolic dysfunction presented to hospital for outpt upper and lower endoscopy for evaluation of unintentional weight loss and poor appetite. Patient complaining of worsening lower abdominal pain on presentation and admitted for acute diverticulitis.  Patient evaluated by surgery for recurrent verses chronic diverticulitis s/p third trial of antibiotics and second hospitalization. Given no overt peritonitis on abdominal exam, stable vital signs, and no leukocytosis,  another trial of IV antibiotics/fluids in hopes of resolving, possible need for Hartman's procedure if acute episode does not resolve with antibiotics.   Patient resting quietly in bed this afternoon, reports ongoing abdominal pain, no vomiting today. She endorses ongoing poor appetite and poor po intake  for the past few months. Patient cares for her 32 year old husband and recently underwent 8 radiation treatments for a small lung nodule. She reports altered taste (metallic) with foods after treatments. RD educated on nutrition impact symptoms of radiation therapy including decreased appetite as well as altered taste and discussed strategies to mitigate symptoms (using plastic silverware when eating, cleansing mouth prior to eating meals, adding a small amount of sugar to foods when cooking). RD discussed the importance of self care, encouraged pt to ask for assistance from family to reduce amount of stress related to care giving as this can also impact nutrition status. Patient appreciative and open to trying suggestions. She reports poor food tolerance since first episode of diverticulitis in October, recalls eating mostly mashed potatoes and string cheese since. Patient tried Ensure and Boost, states they went right through her. RD discussed alternate  oral supplements Anda [employer] [place], [place]) that she could try. Patient is agreeable to [facility] with clear liquid diet advance as well as trying Dillard [place] as diet progresses. Given ongoing very poor po intake and current NPO, she is at risk for refeeding, recommend monitoring  magnesium, potassium, and phosphorus daily with diet advancement, MD to replete as needed.  Per chart, weights have trended down 20 lbs (14.8%) in the last 9 months which is significant for time frame. Patient reports usual weight ~135 lbs, active at baseline, walking 2-3 miles daily prior to first acute episode. She  enjoys being outdoors and walking in her neighborhood, however her  energy level has significantly decreased over the past few months and has not been physically active. Exam completed with moderate fat and muscle depletion as noted below. Given this as well as weight trends and poor po intake, pt meets criteria for malnutrition. Will follow for diet advancement per GI recommendations and provide oral nutrition supplements as above.   Medications reviewed and include: Ativan, Bystolic, Protonix, Adalat, Zosyn  IVF: NaCl @ 100 ml/hr  Labs: CBGs K 3.3 (L), Cr 1.91 (H)   NUTRITION - FOCUSED PHYSICAL EXAM:  Flowsheet Row Most Recent Value  Orbital Region Moderate depletion  Upper Arm Region Moderate depletion  Thoracic and Lumbar Region Mild depletion  Buccal Region Severe depletion  Temple Region Moderate depletion  Clavicle Bone Region Moderate depletion  Clavicle and Acromion Bone Region Mild depletion  Scapular Bone Region Mild depletion  Dorsal Hand Moderate depletion  Patellar Region Mild depletion  Anterior Thigh Region Mild depletion  Posterior Calf Region No depletion  Edema (RD Assessment) None  Hair Reviewed  Eyes Reviewed  Mouth Reviewed  Skin Reviewed  Nails Reviewed       Diet Order:   Diet Order            Diet NPO time specified Except for: Sips with Meds  Diet effective now                 EDUCATION NEEDS:   Education needs have been addressed  Skin:  Skin Assessment: Reviewed RN Assessment  Last BM:  12/9-type 6 (small;brown)  Height:   Ht Readings from Last 1 Encounters:  03/01/20 5\' 5"  (1.651 m)    Weight:   Wt Readings from Last 1 Encounters:  03/01/20 52.2 kg    BMI:  Body mass index is 19.14 kg/m.  Estimated Nutritional Needs:   Kcal:  2993-7169  Protein:  75-90  Fluid:  >/= 1.3 L   Lajuan Lines, RD, LDN Clinical Nutrition After Hours/Weekend Pager # in [place]

## 2020-03-02 NOTE — TOC Initial Note (Signed)
Transition of Care [facility]) - Initial/Assessment Note    Patient Details  Name: Margaret Black MRN: [id number] Date of Birth: May 30, 1940  [facility]) CM/SW Contact:    Candie Chroman, LCSW Phone Number: 03/02/2020, 10:10 AM  Clinical Narrative:   Readmission prevention screen complete. CSW met with patient. No supports at bedside. CSW introduced role and explained that discharge planning would be discussed. Patient from home with husband. PCP is [name], DO. Patient drives herself to appointments. Pharmacy [facility]. If new prescription needed, [facility] [place]. No issues obtaining medications. No home health or DME use prior to admission. CSW reviewed PCP notes from earlier this week. They were considering home health referral. Asked MD to place PT and OT orders for evaluations. No further concerns. CSW encouraged patient to contact CSW as needed. CSW will continue to follow patient for support and facilitate return home when stable.  Expected Discharge Plan: Home/Self Care Barriers to Discharge: Continued Medical Work up   Patient Goals and CMS Choice        Expected Discharge Plan and Services Expected Discharge Plan: Home/Self Care       Living arrangements for the past 2 months: Single Family Home                                      Prior Living Arrangements/Services Living arrangements for the past 2 months: Single Family Home Lives with:: Spouse Patient language and need for interpreter reviewed:: Yes Do you feel safe going back to the place where you live?: Yes      Need for Family Participation in Patient Care: Yes (Comment) Care giver support system in place?: Yes (comment)   Criminal Activity/Legal Involvement Pertinent to Current Situation/Hospitalization: No - Comment as needed  Activities of Daily Living Home Assistive Devices/Equipment: Eyeglasses ADL Screening (condition at time of admission) Patient's  cognitive ability adequate to safely complete daily activities?: Yes Is the patient deaf or have difficulty hearing?: No Does the patient have difficulty seeing, even when wearing glasses/contacts?: No Does the patient have difficulty concentrating, remembering, or making decisions?: No Patient able to express need for assistance with ADLs?: Yes Does the patient have difficulty dressing or bathing?: No Independently performs ADLs?: Yes (appropriate for developmental age) Does the patient have difficulty walking or climbing stairs?: Yes Weakness of Legs: Both Weakness of Arms/Hands: None  Permission Sought/Granted                  Emotional Assessment Appearance:: Appears stated age Attitude/Demeanor/Rapport: Engaged Affect (typically observed): Appropriate,Calm Orientation: : Oriented to Self,Oriented to Place,Oriented to  Time,Oriented to Situation Alcohol / Substance Use: Not Applicable Psych Involvement: No (comment)  Admission diagnosis:  Acute diverticulitis [K57.92] Patient Active Problem List   Diagnosis Date Noted  . Acute diverticulitis   . Acute kidney injury superimposed on CKD ([place])   . Essential hypertension   . Diverticulitis of colon with bleeding 01/07/2020  . Lung cancer ([place])   . Hyponatremia   . Aortic atherosclerosis ([place]) 10/24/2019  . Centrilobular emphysema ([place]) 10/24/2019  . Abdominal pain 06/21/2019  . Anemia in chronic kidney disease 01/19/2019  . Anxiety 06/12/2017  . Rheumatoid factor positive 02/26/2017  . Brachial radiculitis 12/25/2016  . Closed traumatic dislocation of cervical vertebra 12/25/2016  . Weakness of limb 12/25/2016  . Insomnia 02/08/2015  . Carotid stenosis 02/08/2015  .  Solitary pulmonary nodule 03/31/2014  . Systemic lupus ([place]) 12/15/2013  . Near syncope 05/03/2013  . Vertigo 08/09/2012  . Hx of cancer of lung 07/14/2012  . History of thyroid cancer 06/30/2012  . Livedo reticularis 06/11/2012  . Antiphospholipid  antibody positive 06/11/2012  . CRVO (central retinal vein occlusion) 05/25/2012  . Rash 04/06/2012  . Syncope 09/29/2011  . Stage III chronic kidney disease ([place]) 10/09/2010  . HYPERALDOSTERONISM UNSPECIFIED 01/12/2009  . HYPERPLASIA, ADRENAL 01/12/2009  . HYPERTENSION, PULMONARY 01/04/2009  . Blindness of left eye 12/12/2008  . IBS 07/12/2007  . Hypercholesterolemia 04/27/2007  . INCONTINENCE, URGE 04/23/2007  . Postsurgical hypothyroidism 06/22/2006  . Benign hypertension with CKD (chronic kidney disease) stage III ([place]) 06/22/2006  . GERD 06/22/2006  . Osteopenia 06/22/2006  . UTERINE CANCER, HX OF 06/22/2006  . DIVERTICULITIS, HX OF 06/22/2006   PCP:  Olin Hauser, DO Pharmacy:   [facility] [place] [place] [address] [place] [address] Phone: [phone] Fax: [phone]  [id number] [id number]: [phone]: [phone]    Social Determinants of Health ([place] Interventions    Readmission Risk Interventions Readmission Risk Prevention Plan 03/02/2020  Transportation Screening Complete  PCP or Specialist Appt within 3-5 Days Complete  Social Work Consult for RMcNaryPlanning/Counseling CTangierNot Applicable  Medication Review (Press photographer Complete  Some recent data might be hidden

## 2020-03-02 NOTE — Progress Notes (Signed)
Subjective:  CC: Margaret Black is a 79 y.o. female  Hospital stay day 1,  diverticulitis  HPI: No acute issues overnight.  Pain better, now more crampy  ROS:  General: Denies weight loss, weight gain, fatigue, fevers, chills, and night sweats. Heart: Denies chest pain, palpitations, racing heart, irregular heartbeat, leg pain or swelling, and decreased activity tolerance. Respiratory: Denies breathing difficulty, shortness of breath, wheezing, cough, and sputum. GI: Denies change in appetite, heartburn, nausea, vomiting, constipation, diarrhea, and blood in stool. GU: Denies difficulty urinating, pain with urinating, urgency, frequency, blood in urine.   Objective:   Temp:  [98.6 F (37 C)-100.8 F (38.2 C)] 99.1 F (37.3 C) (12/10 0418) Pulse Rate:  [70-88] 81 (12/10 0418) Resp:  [16-20] 20 (12/10 0418) BP: (124-156)/(45-63) 125/45 (12/10 0418) SpO2:  [94 %-100 %] 96 % (12/10 0418) Weight:  [52.2 kg] 52.2 kg (12/09 1007)     Height: 5\' 5"  (165.1 cm) Weight: 52.2 kg BMI (Calculated): 19.14   Intake/Output this shift:   Intake/Output Summary (Last 24 hours) at 03/02/2020 0831 Last data filed at 03/02/2020 0220 Gross per 24 hour  Intake 220.56 ml  Output 300 ml  Net -79.44 ml    Constitutional :  alert, cooperative, appears stated age and no distress  Respiratory:  clear to auscultation bilaterally  Cardiovascular:  regular rate and rhythm  Gastrointestinal: soft, no guarding, focal TTP persists in LLQ and suprapubic region.   Skin: Cool and moist.   Psychiatric: Normal affect, non-agitated, not confused       LABS:  CMP Latest Ref Rng & Units 03/02/2020 03/01/2020 02/07/2020  Glucose 70 - 99 mg/dL 141(H) 157(H) 94  BUN 8 - 23 mg/dL 20 19 12   Creatinine 0.44 - 1.00 mg/dL 1.91(H) 2.07(H) 1.24(H)  Sodium 135 - 145 mmol/L 135 136 135  Potassium 3.5 - 5.1 mmol/L 3.3(L) 3.9 4.5  Chloride 98 - 111 mmol/L 98 96(L) 97  CO2 22 - 32 mmol/L 23 25 21   Calcium 8.9 - 10.3 mg/dL  8.2(L) 8.8(L) 9.5  Total Protein 6.0 - 8.5 g/dL - - 6.6  Total Bilirubin 0.0 - 1.2 mg/dL - - 0.3  Alkaline Phos 44 - 121 IU/L - - 80  AST 0 - 40 IU/L - - 17  ALT 0 - 32 IU/L - - 14   CBC Latest Ref Rng & Units 03/02/2020 03/01/2020 02/07/2020  WBC 4.0 - 10.5 K/uL 9.6 10.1 5.1  Hemoglobin 12.0 - 15.0 g/dL 11.0(L) 10.4(L) 11.7  Hematocrit 36.0 - 46.0 % 32.3(L) 30.0(L) 34.9  Platelets 150 - 400 K/uL 273 298 473(H)    RADS: n/a Assessment:   Diverticulitis.  Pain reported to be improving but physical exam still with decent tenderness to palpation.  Recommend keeping n.p.o. again for another day or until pain significantly improves prior to restarting diet.  Continue IV antibiotics and IV fluids until then.

## 2020-03-03 ENCOUNTER — Inpatient Hospital Stay: Payer: Self-pay

## 2020-03-03 DIAGNOSIS — E43 Unspecified severe protein-calorie malnutrition: Secondary | ICD-10-CM | POA: Insufficient documentation

## 2020-03-03 DIAGNOSIS — E039 Hypothyroidism, unspecified: Secondary | ICD-10-CM

## 2020-03-03 LAB — CBC
HCT: 28 % — ABNORMAL LOW (ref 36.0–46.0)
Hemoglobin: 9.5 g/dL — ABNORMAL LOW (ref 12.0–15.0)
MCH: 31.9 pg (ref 26.0–34.0)
MCHC: 33.9 g/dL (ref 30.0–36.0)
MCV: 94 fL (ref 80.0–100.0)
Platelets: 266 10*3/uL (ref 150–400)
RBC: 2.98 MIL/uL — ABNORMAL LOW (ref 3.87–5.11)
RDW: 14.1 % (ref 11.5–15.5)
WBC: 9.9 10*3/uL (ref 4.0–10.5)
nRBC: 0 % (ref 0.0–0.2)

## 2020-03-03 LAB — BASIC METABOLIC PANEL
Anion gap: 8 (ref 5–15)
BUN: 21 mg/dL (ref 8–23)
CO2: 23 mmol/L (ref 22–32)
Calcium: 8.1 mg/dL — ABNORMAL LOW (ref 8.9–10.3)
Chloride: 103 mmol/L (ref 98–111)
Creatinine, Ser: 1.68 mg/dL — ABNORMAL HIGH (ref 0.44–1.00)
GFR, Estimated: 31 mL/min — ABNORMAL LOW (ref >60)
Glucose, Bld: 87 mg/dL (ref 70–99)
Potassium: 3.6 mmol/L (ref 3.5–5.1)
Sodium: 134 mmol/L — ABNORMAL LOW (ref 135–145)

## 2020-03-03 LAB — GLUCOSE, CAPILLARY: Glucose-Capillary: 130 mg/dL — ABNORMAL HIGH (ref 70–99)

## 2020-03-03 LAB — PHOSPHORUS: Phosphorus: 2.3 mg/dL — ABNORMAL LOW (ref 2.5–4.6)

## 2020-03-03 LAB — MAGNESIUM: Magnesium: 1.6 mg/dL — ABNORMAL LOW (ref 1.7–2.4)

## 2020-03-03 MED ORDER — SODIUM CHLORIDE 0.9 % IV SOLN: INTRAVENOUS | Status: DC

## 2020-03-03 MED ORDER — SODIUM CHLORIDE 0.9% FLUSH: 10.0000 mL | INTRAVENOUS | Status: DC | PRN

## 2020-03-03 MED ORDER — CHLORHEXIDINE GLUCONATE CLOTH 2 % EX PADS
6.0000 | MEDICATED_PAD | Freq: Every day | CUTANEOUS | Status: DC
  Administered 2020-03-03 – 2020-03-20 (×17): 6 via TOPICAL

## 2020-03-03 MED ORDER — TRACE MINERALS CU-MN-SE-ZN 300-55-60-3000 MCG/ML IV SOLN
INTRAVENOUS | Status: DC
  Filled 2020-03-03: qty 291.2

## 2020-03-03 MED ORDER — INSULIN ASPART 100 UNIT/ML ~~LOC~~ SOLN
0.0000 [IU] | SUBCUTANEOUS | Status: DC
  Administered 2020-03-04 (×2): 2 [IU] via SUBCUTANEOUS
  Administered 2020-03-04: 1 [IU] via SUBCUTANEOUS
  Administered 2020-03-04: 3 [IU] via SUBCUTANEOUS
  Administered 2020-03-04 – 2020-03-05 (×5): 2 [IU] via SUBCUTANEOUS
  Filled 2020-03-03 (×8): qty 1

## 2020-03-03 MED ORDER — SODIUM CHLORIDE 0.9% FLUSH
10.0000 mL | Freq: Two times a day (BID) | INTRAVENOUS | Status: DC
  Administered 2020-03-03: 20 mL
  Administered 2020-03-04 – 2020-03-07 (×7): 10 mL
  Administered 2020-03-07: 20 mL

## 2020-03-03 MED ORDER — TRAVASOL 10 % IV SOLN
INTRAVENOUS | Status: AC
  Filled 2020-03-03: qty 356.2

## 2020-03-03 MED ORDER — PIPERACILLIN-TAZOBACTAM 3.375 G IVPB
3.3750 g | Freq: Three times a day (TID) | INTRAVENOUS | Status: DC
  Administered 2020-03-03 – 2020-03-04 (×3): 3.375 g via INTRAVENOUS
  Filled 2020-03-03 (×4): qty 50

## 2020-03-03 MED ORDER — MAGNESIUM SULFATE IN D5W 1-5 GM/100ML-% IV SOLN
1.0000 g | Freq: Once | INTRAVENOUS | Status: AC
  Administered 2020-03-03: 1 g via INTRAVENOUS
  Filled 2020-03-03: qty 100

## 2020-03-03 MED ORDER — POTASSIUM PHOSPHATES 15 MMOLE/5ML IV SOLN
15.0000 mmol | Freq: Once | INTRAVENOUS | Status: AC
  Administered 2020-03-03: 15 mmol via INTRAVENOUS
  Filled 2020-03-03: qty 5

## 2020-03-03 NOTE — Progress Notes (Signed)
Brief Nutrition Note  Consult received for parenteral nutrition. Central Access: PICC  Pharmacy to initiate Clinimix 5%AA/15%Dextrose at 4ml/hr with MVI and Trace Minerals at this time.  Further recommendations to follow.  Admitting Dx: Acute diverticulitis [K57.92]  Body mass index is 19.14 kg/m.   Labs:  Recent Labs  Lab 03/01/20 1153 03/02/20 0638 03/03/20 0750  NA 136 135 134*  K 3.9 3.3* 3.6  CL 96* 98 103  CO2 25 23 23   BUN 19 20 21   CREATININE 2.07* 1.91* 1.68*  CALCIUM 8.8* 8.2* 8.1*  MG  --   --  1.6*  PHOS  --   --  2.3*    Margaret Black, RD, LDN, CNSC Please refer to Amion for contact information.

## 2020-03-03 NOTE — Progress Notes (Signed)
[facility] Day(s): 2.   Post op day(s): 2 Days Post-Op.   Interval History: Patient seen and examined, no acute events or new complaints overnight. Patient reports she feels the same.  She reported that she does not feel any improvement in her abdominal pain.  The pain localized in the lower abdomen.  There is no pain radiation.  Aggravating factor is applying pressure to the abdomen.  There has been no alleviating factors.  Patient denies any nausea or vomiting.  Patient report passing gas.  Vital signs in last 24 hours: [min-max] current  Temp:  [98.2 F (36.8 C)-100.6 F (38.1 C)] 99.2 F (37.3 C) (12/11 0811) Pulse Rate:  [71-89] 84 (12/11 0811) Resp:  [14-20] 20 (12/11 0811) BP: (110-131)/(40-52) 118/40 (12/11 0811) SpO2:  [91 %-98 %] 95 % (12/11 0811)     Height: 5\' 5"  (165.1 cm) Weight: 52.2 kg BMI (Calculated): 19.14   Physical Exam:  Constitutional: alert, cooperative and no distress  Respiratory: breathing non-labored at rest  Cardiovascular: regular rate and sinus rhythm  Gastrointestinal: soft, tender in both lower quadrants, and non-distended  Labs:  CBC Latest Ref Rng & Units 03/03/2020 03/02/2020 03/01/2020  WBC 4.0 - 10.5 K/uL 9.9 9.6 10.1  Hemoglobin 12.0 - 15.0 g/dL 9.5(L) 11.0(L) 10.4(L)  Hematocrit 36.0 - 46.0 % 28.0(L) 32.3(L) 30.0(L)  Platelets 150 - 400 K/uL 266 273 298   CMP Latest Ref Rng & Units 03/03/2020 03/02/2020 03/01/2020  Glucose 70 - 99 mg/dL 87 141(H) 157(H)  BUN 8 - 23 mg/dL 21 20 19   Creatinine 0.44 - 1.00 mg/dL 1.68(H) 1.91(H) 2.07(H)  Sodium 135 - 145 mmol/L 134(L) 135 136  Potassium 3.5 - 5.1 mmol/L 3.6 3.3(L) 3.9  Chloride 98 - 111 mmol/L 103 98 96(L)  CO2 22 - 32 mmol/L 23 23 25   Calcium 8.9 - 10.3 mg/dL 8.1(L) 8.2(L) 8.8(L)  Total Protein 6.0 - 8.5 g/dL - - -  Total Bilirubin 0.0 - 1.2 mg/dL - - -  Alkaline Phos 44 - 121 IU/L - - -  AST 0 - 40 IU/L - - -  ALT 0 - 32 IU/L - - -    Imaging studies: No new  pertinent imaging studies   Assessment/Plan:  79 y.o. female with recurrent acute diverticulitis, complicated by pertinent comorbidities including anxiety disorder, chronic kidney disease, COPD, hypertension, hypothyroidism, irritable bowel syndrome, history of lung cancer, lupus.  Patient without significant improvement on physical exam.  There was a low-grade fever.  White blood cell still within normal limits.  Patient very upset with her not improving situation.  Discussion regarding the possibility of needing surgery with end colostomy creation was taken again.  Patient completely refused to consent for any surgical management and quote " I prefer to die before having a colostomy".  I discussed with her that we can try to continue IV antibiotic therapy.  Due to her pain and physical exam I will recommend to continue n.p.o.  I would recommend to start TPN since I expect she will take more than 5 days to improve if she improves.  I discussed the case with hospitalist in details.  Continue n.p.o., continue IV antibiotic therapy, consider TPN, encourage to ambulate.  Arnold Long, MD

## 2020-03-03 NOTE — Progress Notes (Signed)
OT Cancellation Note  Patient Details Name: Margaret Black MRN: [id number] DOB: [birth year]   Cancelled Treatment:    Reason Eval/Treat Not Completed: Patient declined, no reason specified. Attempted x2 to see pt this AM. Pt declines stating too weak/hungry - reports has been NPO and does not think she can get up until she eats. Pt  Instructed on importance of mobility for functional strengthening. Will continue to follow and initiate services as pt willing to participate.   Dessie Coma, M.S. OTR/L  03/03/20, 11:28 AM  ascom [phone]

## 2020-03-03 NOTE — Progress Notes (Signed)
Pharmacy Antibiotic Note  Margaret Black is a 79 y.o. female with PMH significant for significant forGERD, COPD, lung cancer s/p evaluation for radiation, hypothyroidism, SLE and stage IV chronic kidney disease who presented to the hospital for an outpatient upper and lower endoscopy after evaluation for unintentional weight loss and poor appetite. CT scan shows persistent sigmoid diverticulitis which is worse compared to 6 weeks ago. Pharmacy has been consulted for Zosyn dosing.  renal function on admission is lower than his apparent baseline level  Plan:   Crcl now > 20 ml/min, will transition Zosyn from 2.25 grams IV q8h to 3.375gm IV q8h. Crcl 22.4 ml/min  Height: 5\' 5"  (165.1 cm) Weight: 52.2 kg (115 lb) IBW/kg (Calculated) : 57  Temp (24hrs), Avg:99.3 F (37.4 C), Min:98.2 F (36.8 C), Max:100.6 F (38.1 C)  Recent Labs  Lab 03/01/20 1153 03/01/20 1407 03/02/20 0638 03/03/20 0750  WBC  --  10.1 9.6 9.9  CREATININE 2.07*  --  1.91* 1.68*    Estimated Creatinine Clearance: 22.4 mL/min (A) (by C-G formula based on SCr of 1.68 mg/dL (H)).    Antimicrobials this admission: Zosyn 12/09 >>   Microbiology results: 12/09 GI panel: pending 12/09 C diff: pending 12/09 UCx: pending   Thank you for allowing pharmacy to be a part of this patient's care.  Dawan Farney A 03/03/2020 12:29 PM

## 2020-03-03 NOTE — Progress Notes (Signed)
Peripherally Inserted Central Catheter Placement  The IV Nurse has discussed with the patient and/or persons authorized to consent for the patient, the purpose of this procedure and the potential benefits and risks involved with this procedure.  The benefits include less needle sticks, lab draws from the catheter, and the patient may be discharged home with the catheter. Risks include, but not limited to, infection, bleeding, blood clot (thrombus formation), and puncture of an artery; nerve damage and irregular heartbeat and possibility to perform a PICC exchange if needed/ordered by physician.  Alternatives to this procedure were also discussed.  Bard Power PICC patient education guide, fact sheet on infection prevention and patient information card has been provided to patient /or left at bedside.    PICC Placement Documentation  PICC Double Lumen 03/03/20 PICC Right Brachial 35 cm 0 cm (Active)  Indication for Insertion or Continuance of Line Administration of hyperosmolar/irritating solutions (i.e. TPN, Vancomycin, etc.) 03/03/20 1654  Exposed Catheter (cm) 0 cm 03/03/20 1654  Site Assessment Clean;Dry;Intact 03/03/20 1654  Lumen #1 Status Flushed;Saline locked;Blood return noted 03/03/20 1654  Lumen #2 Status Flushed;Saline locked;Blood return noted 03/03/20 1654  Dressing Type Transparent 03/03/20 1654  Dressing Status Clean;Dry;Intact 03/03/20 1654  Antimicrobial disc in place? Yes 03/03/20 1654  Dressing Intervention New dressing 03/03/20 9407  Dressing Change Due 03/10/20 03/03/20 1654       Gordan Payment 03/03/2020, 4:55 PM

## 2020-03-03 NOTE — Progress Notes (Addendum)
PHARMACY - TOTAL PARENTERAL NUTRITION CONSULT NOTE   Indication: severe diverticulitis  Patient Measurements: Height: 5\' 5"  (165.1 cm) Weight: 52.2 kg (115 lb) IBW/kg (Calculated) : 57 TPN AdjBW (KG): 52.2 Body mass index is 19.14 kg/m. Usual Weight:   Assessment:  recurrent diverticulitis, AKI. Per surgery note: this will be her third trial of antibiotics, second hospitalization. typical recommendation is to proceed with surgical resection of the colon.  Surgery of this type of thing resulted in a temporary colostomy due to the extensive inflammation making primary anastomosis higher risk.  Patient adamantly refusing a colostomy placement at this time.  Glucose / Insulin: start SSI q4h Electrolytes:Na 134 K 3.6   Mag 1.6  Phos 2.3  Scr 1.68 Ca 8.1 Renal: Scr 1.68  Crcl 22 LFTs / TGs: 17/14  TG tomorrow Prealbumin / albumin:   Alb 4.0 Intake / Output; MIVF:  GI Imaging: Surgeries / Procedures: none this visit  Central access: to get PICC ? 03/03/20? TPN start date:   Nutritional Goals (per RD recommendation on 03/02/20): kCal: 1565-1830, Protein: 75-90 g, Fluid: >/= 1.3 L Goal TPN rate is 56 mL/hr (provides 87 g of protein and 1750 kcals per day)  Current Nutrition:  NPO  Plan:   Start TPN (at 1/2 of goal rate)28 mL/hr at 1800 (total volume 1344 mL) once gets PICC  (goal rate= 56 ml/hr)  Nutritional Components at goal rate: ? Protein: 53 G/L Travasol 10% (71 g/day)  ? *>>Clinisol unavailable at [facility] today 12/11-will likely need to use that if goals/fluid volume remain the same to get Kcal up? ? Dextrose:18 % (241g/day) ? Lipids: 30 g/L (40.4 g/day) ? kCal: 1511  Electrolytes in TPN (standard): 11mEq/L of Na, 71mEq/L of K, 66mEq/L of Ca, 48mEq/L of Mg, and 52mmol/L of Phos. Cl:Ac ratio 1:1  Phos= 2.3. Potassium Phosphate 15 mmol IV x 1 dose (has K+ 22 meq)  Ma= 1.6 (scr 1.68).  1 grams IV magnesium sulfate x 1   Add standard MVI and trace elements to TPN  daily  Sensitive SSI q4h SSI and adjust as needed   NS at 100 mL/hr- adjust to 72 ml/hr when TPN starts per discussion w/ MD  Monitor TPN labs on Mon/Thurs, daily until stable   Decarlos Empey A 03/03/2020,11:57 AM

## 2020-03-03 NOTE — Evaluation (Addendum)
Physical Therapy Evaluation Patient Details Name: Margaret Black MRN: [id number] DOB: [birth year] Today's Date: 03/03/2020   History of Present Illness  Pt is a 79 y/o F who presented to the hospital on 03/01/20 for endoscopy 2/2 unintentional weight loss & poor appetite. CT scan of abdomen and pelvis done without contrast showed  persistent evidence of diverticulitis. PMH: COPD, lung CA, SLE, Stage IV CKD, anxiety, L eye visual impairments, osteopenia, uterine CA, thyroid CA    Clinical Impression  Pt is frustrated re: NPO status but agreeable to tx with encouragement from PT & daughter. Pt is able to ambulate around nurses station without AD with min assist but notes BUE trembling with activity, PT observed this. Pt reports need to use restroom & completes toilet transfer with grab bar & CGA, (+) void. Will continue to follow pt acutely to focus on high level balance & endurance.    Follow Up Recommendations Home health PT;Supervision for mobility/OOB    Equipment Recommendations  None recommended by PT    Recommendations for Other Services       Precautions / Restrictions Precautions Precautions: Fall Restrictions Weight Bearing Restrictions: No      Mobility  Bed Mobility Overal bed mobility: Modified Independent                  Transfers Overall transfer level: Modified independent               General transfer comment: sit>stand mod I  Ambulation/Gait Ambulation/Gait assistance: Min assist Gait Distance (Feet): 200 Feet, decreased stride length Assistive device: None   Gait velocity: decreased      Stairs            Wheelchair Mobility    Modified Rankin (Stroke Patients Only)       Balance Overall balance assessment: Mild deficits observed, not formally tested;Needs assistance         Standing balance support: No upper extremity supported;During functional activity Standing balance-Leahy Scale: Fair Standing balance comment: gait  without UE support without LOB noted                             Pertinent Vitals/Pain Pain Assessment: No/denies pain    Home Living Family/patient expects to be discharged to:: Private residence Living Arrangements: Spouse/significant other Available Help at Discharge: Family;Available PRN/intermittently Type of Home: House Home Access: Level entry     Home Layout: One level Home Equipment: None      Prior Function Level of Independence: Independent         Comments: driving, cooking, independent without AD     Hand Dominance        Extremity/Trunk Assessment   Upper Extremity Assessment Upper Extremity Assessment: Generalized weakness    Lower Extremity Assessment Lower Extremity Assessment: Generalized weakness       Communication   Communication: No difficulties  Cognition Arousal/Alertness: Awake/alert Behavior During Therapy: WFL for tasks assessed/performed Overall Cognitive Status: Within Functional Limits for tasks assessed                                 General Comments: pt frustrated due to NPO status but pleasant & joking with PT during session      General Comments General comments (skin integrity, edema, etc.): BUE shaky during gait, SPO2 = 91-95%, HR = 104 bpm at end of session  Exercises     Assessment/Plan    PT Assessment Patient needs continued PT services  PT Problem List Decreased strength;Decreased mobility;Decreased activity tolerance;Decreased balance       PT Treatment Interventions      PT Goals (Current goals can be found in the Care Plan section)  Acute Rehab PT Goals Patient Stated Goal: drink water PT Goal Formulation: With patient Time For Goal Achievement: 03/17/20 Potential to Achieve Goals: Poor    Frequency Min 2X/week   Barriers to discharge        Co-evaluation               AM-PAC PT "6 Clicks" Mobility  Outcome Measure Help needed turning from your back to  your side while in a flat bed without using bedrails?: None Help needed moving from lying on your back to sitting on the side of a flat bed without using bedrails?: None Help needed moving to and from a bed to a chair (including a wheelchair)?: A Little Help needed standing up from a chair using your arms (e.g., wheelchair or bedside chair)?: None Help needed to walk in hospital room?: A Little Help needed climbing 3-5 steps with a railing? : A Little 6 Click Score: 21    End of Session Equipment Utilized During Treatment: Gait belt Activity Tolerance: Patient tolerated treatment well Patient left: in chair;with call bell/phone within reach;with chair alarm set;with family/visitor present Nurse Communication: Mobility status PT Visit Diagnosis: Unsteadiness on feet (R26.81);Muscle weakness (generalized) (M62.81)    Time: 4268-3419 PT Time Calculation (min) (ACUTE ONLY): 16 min   Charges:   PT Evaluation $PT Eval Low Complexity: 1 Low PT Treatments $Therapeutic Activity: 8-22 mins        Lavone Nian, PT, DPT 03/03/20, 4:07 PM   Waunita Schooner 03/03/2020, 4:04 PM

## 2020-03-03 NOTE — Progress Notes (Signed)
[name], female   DOB: 1940-06-29, 79 y.o.   MRN: [id number] [facility] PROGRESS NOTE  CHUDNEY SCHEFFLER QPY:[id number] DOB: May 14, 1940 DOA: 03/01/2020 PCP: Olin Hauser, DO  HPI/Subjective: Patient does not want to have a colostomy.  Still having lot of abdominal pain.  No nausea or vomiting.  No bowel movement yet.  Admitted with diverticulitis.  Objective: Vitals:   03/03/20 0811 03/03/20 1136  BP: (!) 118/40 (!) 132/51  Pulse: 84 84  Resp: 20 18  Temp: 99.2 F (37.3 C) 99.7 F (37.6 C)  SpO2: 95% 94%    Intake/Output Summary (Last 24 hours) at 03/03/2020 1306 Last data filed at 03/03/2020 0123 Gross per 24 hour  Intake 2000.12 ml  Output --  Net 2000.12 ml   Filed Weights   03/01/20 1007  Weight: 52.2 kg    ROS: Review of Systems  Respiratory: Negative for shortness of breath.   Cardiovascular: Positive for palpitations. Negative for chest pain.  Gastrointestinal: Positive for abdominal pain. Negative for nausea and vomiting.   Exam: Physical Exam HENT:     Head: Normocephalic.     Mouth/Throat:     Pharynx: No oropharyngeal exudate.  Eyes:     General: Lids are normal.     Conjunctiva/sclera: Conjunctivae normal.     Pupils: Pupils are equal, round, and reactive to light.  Cardiovascular:     Rate and Rhythm: Normal rate and regular rhythm.     Heart sounds: Normal heart sounds, S1 normal and S2 normal.  Pulmonary:     Breath sounds: No decreased breath sounds, wheezing, rhonchi or rales.  Abdominal:     Palpations: Abdomen is soft.     Tenderness: There is generalized abdominal tenderness.  Musculoskeletal:     Right lower leg: No swelling.     Left lower leg: No swelling.  Skin:    General: Skin is warm.     Findings: No rash.  Neurological:     Mental Status: She is alert and oriented to person, place, and time.       Data Reviewed: Basic Metabolic Panel: Recent Labs  Lab 03/01/20 1153 03/02/20 0638  03/03/20 0750  NA 136 135 134*  K 3.9 3.3* 3.6  CL 96* 98 103  CO2 25 23 23   GLUCOSE 157* 141* 87  BUN 19 20 21   CREATININE 2.07* 1.91* 1.68*  CALCIUM 8.8* 8.2* 8.1*  MG  --   --  1.6*  PHOS  --   --  2.3*   CBC: Recent Labs  Lab 03/01/20 1407 03/02/20 0638 03/03/20 0750  WBC 10.1 9.6 9.9  NEUTROABS 9.3*  --   --   HGB 10.4* 11.0* 9.5*  HCT 30.0* 32.3* 28.0*  MCV 92.3 95.3 94.0  PLT 298 273 266     Recent Results (from the past 240 hour(s))  SARS CORONAVIRUS 2 (TAT 6-24 HRS) Nasopharyngeal Nasopharyngeal Swab     Status: None   Collection Time: 02/28/20 10:26 AM   Specimen: Nasopharyngeal Swab  Result Value Ref Range Status   SARS Coronavirus 2 NEGATIVE NEGATIVE Final    Comment: (NOTE) SARS-CoV-2 target nucleic acids are NOT DETECTED.  The SARS-CoV-2 RNA is generally detectable in upper and lower respiratory specimens during the acute phase of infection. Negative results do not preclude SARS-CoV-2 infection, do not rule out co-infections with other pathogens, and should not be used as the sole basis for treatment or other patient management decisions. Negative results must be  combined with clinical observations, patient history, and epidemiological information. The expected result is Negative.  Fact Sheet for Patients: SugarRoll.be  Fact Sheet for Healthcare Providers: https://www.woods-mathews.com/  This test is not yet approved or cleared by the [place] FDA and  has been authorized for detection and/or diagnosis of SARS-CoV-2 by FDA under an Emergency Use Authorization (EUA). This EUA will remain  in effect (meaning this test can be used) for the duration of the COVID-19 declaration under Se ction 564(b)(1) of the Act, 21 U.S.C. section 360bbb-3(b)(1), unless the authorization is terminated or revoked sooner.  Performed at [facility], [place] [address], [place] [address]      Studies: [place]  EKG SITE RITE  Result Date: 03/03/2020 If Site Rite image not attached, placement could not be confirmed due to current cardiac rhythm.   Scheduled Meds: . [START ON 03/04/2020] insulin aspart  0-9 Units Subcutaneous Q4H  . levothyroxine  88 mcg Oral Q0600  . LORazepam  0.5 mg Oral QHS  . nebivolol  5 mg Oral Q2000  . NIFEdipine  60 mg Oral Q2000  . pantoprazole  20 mg Oral Daily   Continuous Infusions: . sodium chloride 100 mL/hr at 03/03/20 1010  . sodium chloride    . magnesium sulfate bolus IVPB    . piperacillin-tazobactam (ZOSYN)  IV    . potassium PHOSPHATE IVPB (in mmol)    . TPN ADULT (ION)      Assessment/Plan:  1. Acute recurrent diverticulitis.  Patient failed outpatient treatment.  Does not think that she got better from the last time in the hospital.  Continue IV Zosyn.  Appreciate surgical follow-up.  General surgery recommends keeping n.p.o. at this time.  General surgery also recommended TPN which will require a PICC line.  PICC line and TPN consult ordered. 2. Acute kidney injury on chronic kidney disease stage IIIb.  Patient came in with a creatinine of 2.07 and with IV fluids improved down to 1.68.  Last month did have a creatinine of 1.24. 3. Essential hypertension on Bystolic and nifedipine 4. Hypothyroidism unspecified on Synthroid 5. GERD on Protonix 6. History of lupus 7. Anemia of chronic disease 8. Weight loss with severe protein calorie malnutrition.  TPN ordered    Code Status:     Code Status Orders  (From admission, onward)         Start     Ordered   03/01/20 1309  Do not attempt resuscitation (DNR)  Continuous       Question Answer Comment  In the event of cardiac or respiratory ARREST Do not call a "code blue"   In the event of cardiac or respiratory ARREST Do not perform Intubation, CPR, defibrillation or ACLS   In the event of cardiac or respiratory ARREST Use medication by any route, position, wound care, and other measures to  relive pain and suffering. May use oxygen, suction and manual treatment of airway obstruction as needed for comfort.   Comments CODE STATUS was discussed with patient who wishes to be placed on a DO NOT RESUSCITATE status      03/01/20 1311        Code Status History    Date Active Date Inactive Code Status Order ID Comments User Context   01/21/2020 2225 01/22/2020 1749 DNR [id number]  Lenore Cordia, MD ED   01/07/2020 [phone] 01/09/2020 1809 DNR [id number]  Collier Bullock, MD ED   Advance Care Planning Activity    Advance Directive  Documentation   Flowsheet Row Most Recent Value  Type of Advance Directive Healthcare Power of Attorney, Living will  Pre-existing out of facility DNR order (yellow form or pink MOST form) --  "MOST" Form in Place? --     Family Communication: Spoke with husband yesterday Disposition Plan: Status is: Inpatient  Dispo: The patient is from: Home              Anticipated d/c is to: Home              Anticipated d/c date is: Likely will need another 4 days or so in the hospital              Patient currently being treated for recurrent diverticulitis  Consultants:  General surgery  Antibiotics:  Zosyn  Time spent: 28 minutes  [place]

## 2020-03-03 NOTE — Evaluation (Signed)
Occupational Therapy Evaluation Patient Details Name: Margaret Black MRN: [id number] DOB: 1940/10/22 Today's Date: 03/03/2020    History of Present Illness Pt is a 79 y/o F who presented to the hospital on 03/01/20 for endoscopy 2/2 unintentional weight loss & poor appetite. CT scan of abdomen and pelvis done without contrast showed  persistent evidence of diverticulitis. PMH: COPD, lung CA, SLE, Stage IV CKD, anxiety, L eye visual impairments, osteopenia, uterine CA, thyroid CA   Clinical Impression   Margaret Black was seen for OT evaluation this date. Prior to hospital admission, pt was Independent in mobility and I/ADLs. Pt lives in h ome c level entry and family available PRN. Pt presents to acute OT demonstrating impaired ADL performance and functional mobility 2/2 decreased LB access and functional strength/balance deficits. Pt currently requires CGA for ADL t/f. MOD I grooming /UBD seated. MOD A for LB access in sitting. Pt focused on having water - OT and daughter reinforced NPO status. Pt provided oral swab - pt dislikes taste of mouth wash but appreciative of water dipped swab to clean mouth. Pt would benefit from skilled OT to address noted impairments and functional limitations (see below for any additional details) in order to maximize safety and independence while minimizing falls risk and caregiver burden. Upon hospital discharge, recommend HHOT to maximize pt safety and return to functional independence during meaningful occupations of daily life.     Follow Up Recommendations  Supervision - Intermittent;Home health OT    Equipment Recommendations  3 in 1 bedside commode    Recommendations for Other Services       Precautions / Restrictions Precautions Precautions: Fall Restrictions Weight Bearing Restrictions: No      Mobility Bed Mobility Overal bed mobility: Modified Independent    General bed mobility comments: received adn left up in chair    Transfers Overall  transfer level: Modified independent    General transfer comment: sit>stand mod I    Balance Overall balance assessment: Mild deficits observed, not formally tested;Needs assistance    Standing balance support: No upper extremity supported;During functional activity Standing balance-Leahy Scale: Fair Standing balance comment: gait without UE support without LOB noted        ADL either performed or assessed with clinical judgement   ADL Overall ADL's : Needs assistance/impaired    General ADL Comments: CGA for ADL t/f. MOD I grooming /UBD seated. MOD A for LB access in sitting                  Pertinent Vitals/Pain Pain Assessment: No/denies pain     Hand Dominance     Extremity/Trunk Assessment Upper Extremity Assessment Upper Extremity Assessment: Generalized weakness   Lower Extremity Assessment Lower Extremity Assessment: Generalized weakness       Communication Communication Communication: No difficulties   Cognition Arousal/Alertness: Awake/alert Behavior During Therapy: WFL for tasks assessed/performed Overall Cognitive Status: Within Functional Limits for tasks assessed        General Comments: frustrated re: NPO but pleasant t/o and A&O x4      Exercises Exercises: Other exercises Other Exercises Other Exercises: Pt and family educated re: OT role, DME recs, d/c recs, falls prevention, ECS, NPO status, adapted dressing/bathing for abdominal pain Other Exercises: LBD, UBD, grooming, sitting balance/tolerance   Shoulder Instructions      Home Living Family/patient expects to be discharged to:: Private residence Living Arrangements: Spouse/significant other Available Help at Discharge: Family;Available PRN/intermittently Type of Home: House Home Access: Level entry  Home Layout: One level     Bathroom Shower/Tub: Chief Strategy Officer: None          Prior Functioning/Environment Level of Independence: Independent         Comments: driving, cooking, independent without AD        OT Problem List: Decreased strength;Decreased activity tolerance;Decreased range of motion      OT Treatment/Interventions: Self-care/ADL training;Therapeutic exercise;Energy conservation;DME and/or AE instruction;Therapeutic activities;Balance training;Patient/family education    OT Goals(Current goals can be found in the care plan section) Acute Rehab OT Goals Patient Stated Goal: drink water OT Goal Formulation: With patient/family Time For Goal Achievement: 03/17/20 Potential to Achieve Goals: Good ADL Goals Pt Will Perform Grooming: Independently;standing Pt Will Perform Lower Body Dressing: Independently;sit to/from stand Pt Will Transfer to Toilet: Independently;regular height toilet;ambulating  OT Frequency: Min 1X/week    AM-PAC OT "6 Clicks" Daily Activity     Outcome Measure Help from another person eating meals?: None Help from another person taking care of personal grooming?: A Little Help from another person toileting, which includes using toliet, bedpan, or urinal?: A Little Help from another person bathing (including washing, rinsing, drying)?: A Little Help from another person to put on and taking off regular upper body clothing?: None Help from another person to put on and taking off regular lower body clothing?: A Lot 6 Click Score: 19   End of Session Nurse Communication: Mobility status  Activity Tolerance: Patient tolerated treatment well Patient left: in chair;with call bell/phone within reach;with chair alarm set;with family/visitor present  OT Visit Diagnosis: Other abnormalities of gait and mobility (R26.89)                Time: 3903-0092 OT Time Calculation (min): 11 min Charges:  OT General Charges $OT Visit: 1 Visit OT Evaluation $OT Eval Low Complexity: 1 Low OT Treatments $Self Care/Home Management : 8-22 mins  Dessie Coma, M.S. OTR/L  03/03/20, 4:19 PM  ascom  [phone]

## 2020-03-04 DIAGNOSIS — N1832 Chronic kidney disease, stage 3b: Secondary | ICD-10-CM

## 2020-03-04 LAB — GLUCOSE, CAPILLARY
Glucose-Capillary: 166 mg/dL — ABNORMAL HIGH (ref 70–99)
Glucose-Capillary: 158 mg/dL — ABNORMAL HIGH (ref 70–99)
Glucose-Capillary: 121 mg/dL — ABNORMAL HIGH (ref 70–99)
Glucose-Capillary: 160 mg/dL — ABNORMAL HIGH (ref 70–99)
Glucose-Capillary: 169 mg/dL — ABNORMAL HIGH (ref 70–99)
Glucose-Capillary: 201 mg/dL — ABNORMAL HIGH (ref 70–99)

## 2020-03-04 LAB — PREALBUMIN: Prealbumin: 6.4 mg/dL — ABNORMAL LOW (ref 18–38)

## 2020-03-04 LAB — DIFFERENTIAL
Abs Immature Granulocytes: 0.06 10*3/uL (ref 0.00–0.07)
Basophils Absolute: 0 10*3/uL (ref 0.0–0.1)
Basophils Relative: 0 %
Eosinophils Absolute: 0.1 10*3/uL (ref 0.0–0.5)
Eosinophils Relative: 1 %
Immature Granulocytes: 1 %
Lymphocytes Relative: 2 %
Lymphs Abs: 0.3 10*3/uL — ABNORMAL LOW (ref 0.7–4.0)
Monocytes Absolute: 0.3 10*3/uL (ref 0.1–1.0)
Monocytes Relative: 2 %
Neutro Abs: 11.5 10*3/uL — ABNORMAL HIGH (ref 1.7–7.7)
Neutrophils Relative %: 94 %

## 2020-03-04 LAB — COMPREHENSIVE METABOLIC PANEL
ALT: 27 U/L (ref 0–44)
AST: 16 U/L (ref 15–41)
Albumin: 2.2 g/dL — ABNORMAL LOW (ref 3.5–5.0)
Alkaline Phosphatase: 161 U/L — ABNORMAL HIGH (ref 38–126)
Anion gap: 6 (ref 5–15)
BUN: 16 mg/dL (ref 8–23)
CO2: 24 mmol/L (ref 22–32)
Calcium: 8.2 mg/dL — ABNORMAL LOW (ref 8.9–10.3)
Chloride: 104 mmol/L (ref 98–111)
Creatinine, Ser: 1.32 mg/dL — ABNORMAL HIGH (ref 0.44–1.00)
GFR, Estimated: 41 mL/min — ABNORMAL LOW (ref >60)
Glucose, Bld: 168 mg/dL — ABNORMAL HIGH (ref 70–99)
Potassium: 3.4 mmol/L — ABNORMAL LOW (ref 3.5–5.1)
Sodium: 134 mmol/L — ABNORMAL LOW (ref 135–145)
Total Bilirubin: 1.1 mg/dL (ref 0.3–1.2)
Total Protein: 5.1 g/dL — ABNORMAL LOW (ref 6.5–8.1)

## 2020-03-04 LAB — CBC
HCT: 26.9 % — ABNORMAL LOW (ref 36.0–46.0)
Hemoglobin: 9.4 g/dL — ABNORMAL LOW (ref 12.0–15.0)
MCH: 32.3 pg (ref 26.0–34.0)
MCHC: 34.9 g/dL (ref 30.0–36.0)
MCV: 92.4 fL (ref 80.0–100.0)
Platelets: 263 10*3/uL (ref 150–400)
RBC: 2.91 MIL/uL — ABNORMAL LOW (ref 3.87–5.11)
RDW: 14.1 % (ref 11.5–15.5)
WBC: 12.3 10*3/uL — ABNORMAL HIGH (ref 4.0–10.5)
nRBC: 0 % (ref 0.0–0.2)

## 2020-03-04 LAB — POTASSIUM: Potassium: 4.6 mmol/L (ref 3.5–5.1)

## 2020-03-04 LAB — TRIGLYCERIDES: Triglycerides: 123 mg/dL (ref <150)

## 2020-03-04 LAB — MAGNESIUM: Magnesium: 1.8 mg/dL (ref 1.7–2.4)

## 2020-03-04 LAB — PHOSPHORUS
Phosphorus: 2.1 mg/dL — ABNORMAL LOW (ref 2.5–4.6)
Phosphorus: 4.8 mg/dL — ABNORMAL HIGH (ref 2.5–4.6)

## 2020-03-04 MED ORDER — PIPERACILLIN-TAZOBACTAM 3.375 G IVPB 30 MIN
3.3750 g | Freq: Three times a day (TID) | INTRAVENOUS | Status: DC
  Administered 2020-03-04 – 2020-03-15 (×32): 3.375 g via INTRAVENOUS
  Filled 2020-03-04 (×66): qty 50

## 2020-03-04 MED ORDER — POTASSIUM PHOSPHATES 15 MMOLE/5ML IV SOLN
30.0000 mmol | Freq: Once | INTRAVENOUS | Status: AC
  Administered 2020-03-04: 30 mmol via INTRAVENOUS
  Filled 2020-03-04: qty 10

## 2020-03-04 MED ORDER — PIPERACILLIN-TAZOBACTAM 3.375 G IVPB 30 MIN
3.3750 g | Freq: Once | INTRAVENOUS | Status: AC
  Administered 2020-03-04: 3.375 g via INTRAVENOUS
  Filled 2020-03-04: qty 50

## 2020-03-04 MED ORDER — SPIRONOLACTONE 25 MG PO TABS
12.5000 mg | ORAL_TABLET | Freq: Every day | ORAL | Status: DC
  Administered 2020-03-05 – 2020-03-20 (×13): 12.5 mg via ORAL
  Filled 2020-03-04: qty 0.5
  Filled 2020-03-04 (×2): qty 1
  Filled 2020-03-04: qty 0.5
  Filled 2020-03-04 (×2): qty 1
  Filled 2020-03-04 (×3): qty 0.5
  Filled 2020-03-04 (×2): qty 1
  Filled 2020-03-04 (×3): qty 0.5
  Filled 2020-03-04 (×4): qty 1
  Filled 2020-03-04 (×2): qty 0.5
  Filled 2020-03-04: qty 1
  Filled 2020-03-04 (×2): qty 0.5
  Filled 2020-03-04 (×2): qty 1
  Filled 2020-03-04: qty 0.5
  Filled 2020-03-04: qty 1
  Filled 2020-03-04: qty 0.5
  Filled 2020-03-04: qty 1
  Filled 2020-03-04 (×2): qty 0.5

## 2020-03-04 MED ORDER — TRACE MINERALS CU-MN-SE-ZN 300-55-60-3000 MCG/ML IV SOLN
INTRAVENOUS | Status: AC
  Filled 2020-03-04: qty 384

## 2020-03-04 MED ORDER — MAGNESIUM SULFATE 2 GM/50ML IV SOLN
2.0000 g | Freq: Once | INTRAVENOUS | Status: AC
  Administered 2020-03-04: 2 g via INTRAVENOUS
  Filled 2020-03-04: qty 50

## 2020-03-04 NOTE — Progress Notes (Signed)
PHARMACY - TOTAL PARENTERAL NUTRITION CONSULT NOTE   Indication: severe diverticulitis  Patient Measurements: Height: 5\' 5"  (165.1 cm) Weight: 52.2 kg (115 lb) IBW/kg (Calculated) : 57 TPN AdjBW (KG): 52.2 Body mass index is 19.14 kg/m. Usual Weight:   Assessment:  recurrent diverticulitis, AKI. Per surgery note: this will be her third trial of antibiotics, second hospitalization. typical recommendation is to proceed with surgical resection of the colon.  Surgery of this type of thing resulted in a temporary colostomy due to the extensive inflammation making primary anastomosis higher risk.  Patient adamantly refusing a colostomy placement at this time.  Glucose / Insulin: BG 121-168  SSI q4h    SSI/24hr= 4units Electrolytes:Na 134 K 3.4   Mag 1.8  Phos 2.1  Scr 1.32 Ca 8.2  Albumin 2.2 Renal: Scr 1.32  Crcl 28.5 LFTs / TGs: WNL   TG 123 Prealbumin / albumin:   Alb 2.2  preAlb 6.4 Intake / Output; MIVF:  GI Imaging: Surgeries / Procedures: none this visit. Pt refusing  Central access: PICC 03/03/20 TPN start date: 03/03/20  Nutritional Goals (per RD recommendation on 03/02/20): kCal: 1565-1830, Protein: 75-90 g, Fluid: >/= 1.3 L Goal TPN rate 60 mL/hr (provides 86 g of protein/day and 1756 kcals per day, 1440 ml/day)  Current Nutrition:  NPO  12/12- per surgery:continue with bowel rest and TPN  Plan:   Will slightly increase TPN rate to 40 ml/hr ( goal rate =60 mL/hr) at 1800 (total volume of TPN 1060 mL) once gets PICC  (goal rate= 56 ml/hr)  Nutritional Components at goal rate: ? Protein: 60 G/L- Clinisol 15% (86.4 g/day)  ? Dextrose: 20 % (288 g/day) ? Lipids: 30 g/L (43.2 g/day)  (24% ) ? kCal: 1756  Electrolytes in TPN (standard): 70mEq/L of Na, 92mEq/L of K, 75mEq/L of Ca, 34mEq/L of Mg, and 87mmol/L of Phos. Cl:Ac ratio 1:1  K=3.4, Phos= 2.1. Will order Potassium Phosphate 30 mmol IV x 1 dose (has K+ 44 meq). Recheck K and Phos at 1800  Mag= 1.8 (scr 1.68>1.32).  Will order 2 grams IV magnesium sulfate IV x 1   Add standard MVI and trace elements to TPN daily  Sensitive SSI q4h SSI and adjust as needed   No IVF currently  Monitor TPN labs on Mon/Thurs, daily until stable   Goldie Tregoning A 03/04/2020,10:55 AM

## 2020-03-04 NOTE — Progress Notes (Signed)
[facility] Hospital Day(s): 3.   Post op day(s): 3 Days Post-Op.   Interval History: Patient seen and examined, no acute events or new complaints overnight. Patient reports there is mild improvement of the pain.  There is no worsening of the pain.  She denies any fever or chills.  To report passing gas.  The pain localized to the lower abdomen.  No pain radiation.  No alleviating or aggravating factors.  Vital signs in last 24 hours: [min-max] current  Temp:  [97.7 F (36.5 C)-99.7 F (37.6 C)] 97.7 F (36.5 C) (12/12 0407) Pulse Rate:  [84-93] 93 (12/12 0407) Resp:  [16-18] 17 (12/12 0407) BP: (131-140)/(51-63) 135/63 (12/12 0407) SpO2:  [92 %-96 %] 96 % (12/12 0407)     Height: 5\' 5"  (165.1 cm) Weight: 52.2 kg BMI (Calculated): 19.14   Physical Exam:  Constitutional: alert, cooperative and no distress  Respiratory: breathing non-labored at rest  Cardiovascular: regular rate and sinus rhythm  Gastrointestinal: soft, tender in the left lower quadrant, and non-distended  Labs:  CBC Latest Ref Rng & Units 03/04/2020 03/03/2020 03/02/2020  WBC 4.0 - 10.5 K/uL 12.3(H) 9.9 9.6  Hemoglobin 12.0 - 15.0 g/dL 9.4(L) 9.5(L) 11.0(L)  Hematocrit 36.0 - 46.0 % 26.9(L) 28.0(L) 32.3(L)  Platelets 150 - 400 K/uL 263 266 273   CMP Latest Ref Rng & Units 03/04/2020 03/03/2020 03/02/2020  Glucose 70 - 99 mg/dL 168(H) 87 141(H)  BUN 8 - 23 mg/dL 16 21 20   Creatinine 0.44 - 1.00 mg/dL 1.32(H) 1.68(H) 1.91(H)  Sodium 135 - 145 mmol/L 134(L) 134(L) 135  Potassium 3.5 - 5.1 mmol/L 3.4(L) 3.6 3.3(L)  Chloride 98 - 111 mmol/L 104 103 98  CO2 22 - 32 mmol/L 24 23 23   Calcium 8.9 - 10.3 mg/dL 8.2(L) 8.1(L) 8.2(L)  Total Protein 6.5 - 8.1 g/dL 5.1(L) - -  Total Bilirubin 0.3 - 1.2 mg/dL 1.1 - -  Alkaline Phos 38 - 126 U/L 161(H) - -  AST 15 - 41 U/L 16 - -  ALT 0 - 44 U/L 27 - -    Imaging studies: No new pertinent imaging studies   Assessment/Plan:  79 y.o. female with  recurrent acute diverticulitis, complicated by pertinent comorbidities including anxiety disorder, chronic kidney disease, COPD, hypertension, hypothyroidism, irritable bowel syndrome, history of lung cancer, lupus.  Patient with minimal improvement on the physical exam.  Stable tenderness to palpation.  There was a little bit of improvement but not significant.  I recommend to continue with bowel rest and TPN.  I will let the patient take some ice chips.  There was mild increasing white blood cell count.  The fact that the patient does not have fever and there was minimal improvement in the physical exam but no guarding or rebound tenderness I will try to continue IV antibiotic therapy.  I did not guarantee that the patient is respond adequately to just IV antibiotic therapy but at this moment patient is refusing any surgical management.  We will continue to follow along.  Arnold Long, MD

## 2020-03-04 NOTE — Progress Notes (Signed)
PHARMACY - TOTAL PARENTERAL NUTRITION CONSULT NOTE   Indication: severe diverticulitis  Patient Measurements: Height: 5\' 5"  (165.1 cm) Weight: 52.2 kg (115 lb) IBW/kg (Calculated) : 57 TPN AdjBW (KG): 52.2 Body mass index is 19.14 kg/m. Usual Weight:   Assessment:  recurrent diverticulitis, AKI. Per surgery note: this will be her third trial of antibiotics, second hospitalization. typical recommendation is to proceed with surgical resection of the colon.  Surgery of this type of thing resulted in a temporary colostomy due to the extensive inflammation making primary anastomosis higher risk.  Patient adamantly refusing a colostomy placement at this time.  Glucose / Insulin: BG 121-168  SSI q4h    SSI/24hr= 4units Electrolytes:Na 134 K 3.4   Mag 1.8  Phos 2.1  Scr 1.32 Ca 8.2  Albumin 2.2 Renal: Scr 1.32  Crcl 28.5 LFTs / TGs: WNL   TG 123 Prealbumin / albumin:   Alb 2.2  preAlb 6.4 Intake / Output; MIVF:  GI Imaging: Surgeries / Procedures: none this visit. Pt refusing  Central access: PICC 03/03/20 TPN start date: 03/03/20  Nutritional Goals (per RD recommendation on 03/02/20): kCal: 1565-1830, Protein: 75-90 g, Fluid: >/= 1.3 L Goal TPN rate 60 mL/hr (provides 86 g of protein/day and 1756 kcals per day, 1440 ml/day)  Current Nutrition:  NPO  12/12- per surgery:continue with bowel rest and TPN  Plan:   Will slightly increase TPN rate to 40 ml/hr ( goal rate =60 mL/hr) at 1800 (total volume of TPN 1060 mL) once gets PICC  (goal rate= 56 ml/hr)  Nutritional Components at goal rate: ? Protein: 60 G/L- Clinisol 15% (86.4 g/day)  ? Dextrose: 20 % (288 g/day) ? Lipids: 30 g/L (43.2 g/day)  (24% ) ? kCal: 1756  Electrolytes in TPN (standard): 78mEq/L of Na, 12mEq/L of K, 71mEq/L of Ca, 37mEq/L of Mg, and 29mmol/L of Phos. Cl:Ac ratio 1:1  K=3.4, Phos= 2.1. Will order Potassium Phosphate 30 mmol IV x 1 dose (has K+ 44 meq). Recheck K and Phos at 1800  Mag= 1.8 (scr 1.68>1.32).  Will order 2 grams IV magnesium sulfate IV x 1   Add standard MVI and trace elements to TPN daily  Sensitive SSI q4h SSI and adjust as needed   No IVF currently  Monitor TPN labs on Mon/Thurs, daily until stable  Update @ 2027: K 4.6 and phosphorus 4.8 - potassium phosphate replacement bag running while level was obtain. Will f/u on electrolytes with AM labs.    Rowland Lathe 03/04/2020,8:26 PM

## 2020-03-04 NOTE — Progress Notes (Signed)
[name], female   DOB: 1941-03-02, 79 y.o.   MRN: [id number] [facility] PROGRESS NOTE  SCOTTY PINDER FHL:[id number] DOB: [birth year] DOA: 03/01/2020 PCP: Olin Hauser, DO  HPI/Subjective: Patient feeling a little bit better today.  Still having abdominal tenderness but a little bit less.  The surgeon put her on ice chips and she is happy with that.  Receiving extra nutrition via TPN.  Came in with acute diverticulitis.  Objective: Vitals:   03/04/20 0918 03/04/20 1219  BP: (!) 141/50 (!) 122/53  Pulse: 86 90  Resp: 20 16  Temp: 98.7 F (37.1 C) 98.9 F (37.2 C)  SpO2: 94% 98%    Intake/Output Summary (Last 24 hours) at 03/04/2020 1351 Last data filed at 03/04/2020 0406 Gross per 24 hour  Intake 1117.82 ml  Output --  Net 1117.82 ml   Filed Weights   03/01/20 1007  Weight: 52.2 kg    ROS: Review of Systems  Respiratory: Negative for cough and shortness of breath.   Cardiovascular: Negative for chest pain.  Gastrointestinal: Positive for abdominal pain. Negative for nausea and vomiting.   Exam: Physical Exam HENT:     Head: Normocephalic.     Mouth/Throat:     Pharynx: No oropharyngeal exudate.  Eyes:     General: Lids are normal.     Conjunctiva/sclera: Conjunctivae normal.     Pupils: Pupils are equal, round, and reactive to light.  Cardiovascular:     Rate and Rhythm: Normal rate and regular rhythm.     Heart sounds: Normal heart sounds, S1 normal and S2 normal.  Pulmonary:     Breath sounds: No decreased breath sounds, wheezing, rhonchi or rales.  Abdominal:     Palpations: Abdomen is soft.     Tenderness: There is no abdominal tenderness.  Musculoskeletal:     Right lower leg: No swelling.     Left lower leg: No swelling.  Skin:    General: Skin is warm.     Findings: No rash.  Neurological:     Mental Status: She is alert and oriented to person, place, and time.       Data Reviewed: Basic Metabolic Panel: Recent  Labs  Lab 03/01/20 1153 03/02/20 0638 03/03/20 0750 03/04/20 0548  NA 136 135 134* 134*  K 3.9 3.3* 3.6 3.4*  CL 96* 98 103 104  CO2 25 23 23 24   GLUCOSE 157* 141* 87 168*  BUN 19 20 21 16   CREATININE 2.07* 1.91* 1.68* 1.32*  CALCIUM 8.8* 8.2* 8.1* 8.2*  MG  --   --  1.6* 1.8  PHOS  --   --  2.3* 2.1*   Liver Function Tests: Recent Labs  Lab 03/04/20 0548  AST 16  ALT 27  ALKPHOS 161*  BILITOT 1.1  PROT 5.1*  ALBUMIN 2.2*   CBC: Recent Labs  Lab 03/01/20 1407 03/02/20 0638 03/03/20 0750 03/04/20 0548  WBC 10.1 9.6 9.9 12.3*  NEUTROABS 9.3*  --   --  11.5*  HGB 10.4* 11.0* 9.5* 9.4*  HCT 30.0* 32.3* 28.0* 26.9*  MCV 92.3 95.3 94.0 92.4  PLT 298 273 266 263    CBG: Recent Labs  Lab 03/03/20 2044 03/04/20 0100 03/04/20 0409 03/04/20 0745 03/04/20 1219  GLUCAP 130* 166* 158* 121* 160*    Recent Results (from the past 240 hour(s))  SARS CORONAVIRUS 2 (TAT 6-24 HRS) Nasopharyngeal Nasopharyngeal Swab     Status: None   Collection Time: 02/28/20 10:26  AM   Specimen: Nasopharyngeal Swab  Result Value Ref Range Status   SARS Coronavirus 2 NEGATIVE NEGATIVE Final    Comment: (NOTE) SARS-CoV-2 target nucleic acids are NOT DETECTED.  The SARS-CoV-2 RNA is generally detectable in upper and lower respiratory specimens during the acute phase of infection. Negative results do not preclude SARS-CoV-2 infection, do not rule out co-infections with other pathogens, and should not be used as the sole basis for treatment or other patient management decisions. Negative results must be combined with clinical observations, patient history, and epidemiological information. The expected result is Negative.  Fact Sheet for Patients: SugarRoll.be  Fact Sheet for Healthcare Providers: https://www.woods-mathews.com/  This test is not yet approved or cleared by the [place] FDA and  has been authorized for detection and/or  diagnosis of SARS-CoV-2 by FDA under an Emergency Use Authorization (EUA). This EUA will remain  in effect (meaning this test can be used) for the duration of the COVID-19 declaration under Se ction 564(b)(1) of the Act, 21 U.S.C. section 360bbb-3(b)(1), unless the authorization is terminated or revoked sooner.  Performed at [facility], [place] [address] [place], [place] [address]      Studies: [place] EKG SITE RITE  Result Date: 03/03/2020 If Site Rite image not attached, placement could not be confirmed due to current cardiac rhythm.   Scheduled Meds: . Chlorhexidine Gluconate Cloth  6 each Topical Daily  . insulin aspart  0-9 Units Subcutaneous Q4H  . levothyroxine  88 mcg Oral Q0600  . LORazepam  0.5 mg Oral QHS  . nebivolol  5 mg Oral Q2000  . NIFEdipine  60 mg Oral Q2000  . pantoprazole  20 mg Oral Daily  . sodium chloride flush  10-40 mL Intracatheter Q12H   Continuous Infusions: . piperacillin-tazobactam (ZOSYN)  IV 3.375 g (03/04/20 0532)  . potassium PHOSPHATE IVPB (in mmol) 30 mmol (03/04/20 1054)  . TPN ADULT (ION) 28 mL/hr at 03/03/20 1838  . TPN ADULT (ION)      Assessment/Plan:  1. Acute recurrent diverticulitis.  Patient failed outpatient treatment.  Continue IV Zosyn.  Feels a little bit better today.  Able to palpate a little bit deeper today.  Appreciate general surgery consultation.  Ice chips started today.  Patient is also has a PICC line and TPN is running. 2. Acute kidney injury on chronic kidney disease stage IIIb.  Creatinine 2.07 on presentation and now down to 1.32 with IV fluid hydration.  This is a decrease of greater than 0.3 in 48 hours. 3. Essential hypertension, hyperaldosteronism.  Restart Aldactone tomorrow since creatinine better.  Continue Bystolic and nifedipine 4. Hypothyroidism unspecified on Synthroid 5. GERD on Protonix 6. Anemia of chronic disease 7. Weight loss with severe protein calorie malnutrition.  Continue TPN 8. History  of lupus     Code Status:     Code Status Orders  (From admission, onward)         Start     Ordered   03/01/20 1309  Do not attempt resuscitation (DNR)  Continuous       Question Answer Comment  In the event of cardiac or respiratory ARREST Do not call a "code blue"   In the event of cardiac or respiratory ARREST Do not perform Intubation, CPR, defibrillation or ACLS   In the event of cardiac or respiratory ARREST Use medication by any route, position, wound care, and other measures to relive pain and suffering. May use oxygen, suction and manual treatment of  airway obstruction as needed for comfort.   Comments CODE STATUS was discussed with patient who wishes to be placed on a DO NOT RESUSCITATE status      03/01/20 1311        Code Status History    Date Active Date Inactive Code Status Order ID Comments User Context   01/21/2020 2225 01/22/2020 1749 DNR [id number]  Lenore Cordia, MD ED   01/07/2020 [id number] 01/09/2020 1809 DNR [id number]  Collier Bullock, MD ED   Advance Care Planning Activity    Advance Directive Documentation   Flowsheet Row Most Recent Value  Type of Advance Directive Healthcare Power of [place], Living will  Pre-existing out of facility DNR order (yellow form or pink MOST form) --  "MOST" Form in Place? --     Family Communication: Spoke with patient's husband and daughter on the phone Disposition Plan: Status is: Inpatient  Dispo: The patient is from: Home              Anticipated d/c is to: Home              Anticipated d/c date is: Likely will need 3 or 4 days more here on IV antibiotics              Patient currently being treated for recurrent diverticulitis with IV antibiotics  Consultants:  General surgery  Antibiotics:  IV Zosyn  Time spent: 28 minute, case discussed with general surgery  [place]

## 2020-03-05 ENCOUNTER — Ambulatory Visit: Admit: 2020-03-05 | Payer: Medicare Other | Admitting: Gastroenterology

## 2020-03-05 LAB — DIFFERENTIAL
Abs Immature Granulocytes: 0.06 10*3/uL (ref 0.00–0.07)
Basophils Absolute: 0 10*3/uL (ref 0.0–0.1)
Basophils Relative: 0 %
Eosinophils Absolute: 0.1 10*3/uL (ref 0.0–0.5)
Eosinophils Relative: 1 %
Immature Granulocytes: 1 %
Lymphocytes Relative: 4 %
Lymphs Abs: 0.4 10*3/uL — ABNORMAL LOW (ref 0.7–4.0)
Monocytes Absolute: 0.3 10*3/uL (ref 0.1–1.0)
Monocytes Relative: 3 %
Neutro Abs: 9.4 10*3/uL — ABNORMAL HIGH (ref 1.7–7.7)
Neutrophils Relative %: 91 %

## 2020-03-05 LAB — MAGNESIUM: Magnesium: 1.9 mg/dL (ref 1.7–2.4)

## 2020-03-05 LAB — COMPREHENSIVE METABOLIC PANEL
ALT: 19 U/L (ref 0–44)
AST: 16 U/L (ref 15–41)
Albumin: 2.1 g/dL — ABNORMAL LOW (ref 3.5–5.0)
Alkaline Phosphatase: 194 U/L — ABNORMAL HIGH (ref 38–126)
Anion gap: 9 (ref 5–15)
BUN: 16 mg/dL (ref 8–23)
CO2: 24 mmol/L (ref 22–32)
Calcium: 8.4 mg/dL — ABNORMAL LOW (ref 8.9–10.3)
Chloride: 101 mmol/L (ref 98–111)
Creatinine, Ser: 1.44 mg/dL — ABNORMAL HIGH (ref 0.44–1.00)
GFR, Estimated: 37 mL/min — ABNORMAL LOW (ref >60)
Glucose, Bld: 195 mg/dL — ABNORMAL HIGH (ref 70–99)
Potassium: 3.2 mmol/L — ABNORMAL LOW (ref 3.5–5.1)
Sodium: 134 mmol/L — ABNORMAL LOW (ref 135–145)
Total Bilirubin: 1 mg/dL (ref 0.3–1.2)
Total Protein: 5.3 g/dL — ABNORMAL LOW (ref 6.5–8.1)

## 2020-03-05 LAB — GLUCOSE, CAPILLARY
Glucose-Capillary: 176 mg/dL — ABNORMAL HIGH (ref 70–99)
Glucose-Capillary: 178 mg/dL — ABNORMAL HIGH (ref 70–99)
Glucose-Capillary: 178 mg/dL — ABNORMAL HIGH (ref 70–99)
Glucose-Capillary: 185 mg/dL — ABNORMAL HIGH (ref 70–99)
Glucose-Capillary: 194 mg/dL — ABNORMAL HIGH (ref 70–99)

## 2020-03-05 LAB — CBC
HCT: 28.4 % — ABNORMAL LOW (ref 36.0–46.0)
Hemoglobin: 10.1 g/dL — ABNORMAL LOW (ref 12.0–15.0)
MCH: 32.8 pg (ref 26.0–34.0)
MCHC: 35.6 g/dL (ref 30.0–36.0)
MCV: 92.2 fL (ref 80.0–100.0)
Platelets: 277 10*3/uL (ref 150–400)
RBC: 3.08 MIL/uL — ABNORMAL LOW (ref 3.87–5.11)
RDW: 13.9 % (ref 11.5–15.5)
WBC: 10.3 10*3/uL (ref 4.0–10.5)
nRBC: 0 % (ref 0.0–0.2)

## 2020-03-05 LAB — PHOSPHORUS: Phosphorus: 2.9 mg/dL (ref 2.5–4.6)

## 2020-03-05 LAB — PREALBUMIN: Prealbumin: 7.2 mg/dL — ABNORMAL LOW (ref 18–38)

## 2020-03-05 LAB — TRIGLYCERIDES: Triglycerides: 150 mg/dL — ABNORMAL HIGH (ref <150)

## 2020-03-05 SURGERY — COLONOSCOPY WITH PROPOFOL
Anesthesia: General

## 2020-03-05 MED ORDER — TRAVASOL 10 % IV SOLN
INTRAVENOUS | Status: AC
  Filled 2020-03-05: qty 858

## 2020-03-05 MED ORDER — SODIUM CHLORIDE 0.9 % IV SOLN: INTRAVENOUS | Status: DC

## 2020-03-05 MED ORDER — POTASSIUM CHLORIDE 10 MEQ/100ML IV SOLN
10.0000 meq | INTRAVENOUS | Status: AC
  Administered 2020-03-05 (×3): 10 meq via INTRAVENOUS
  Filled 2020-03-05 (×3): qty 100

## 2020-03-05 MED ORDER — POTASSIUM CHLORIDE CRYS ER 20 MEQ PO TBCR: 40.0000 meq | EXTENDED_RELEASE_TABLET | Freq: Three times a day (TID) | ORAL | Status: DC

## 2020-03-05 MED ORDER — INSULIN ASPART 100 UNIT/ML ~~LOC~~ SOLN
0.0000 [IU] | Freq: Four times a day (QID) | SUBCUTANEOUS | Status: DC
  Administered 2020-03-05 (×2): 2 [IU] via SUBCUTANEOUS
  Administered 2020-03-06 – 2020-03-07 (×7): 3 [IU] via SUBCUTANEOUS
  Administered 2020-03-08: 7 [IU] via SUBCUTANEOUS
  Filled 2020-03-05 (×10): qty 1

## 2020-03-05 MED ORDER — OXYCODONE HCL 5 MG PO TABS
5.0000 mg | ORAL_TABLET | ORAL | Status: DC | PRN
  Administered 2020-03-07 – 2020-03-08 (×3): 5 mg via ORAL
  Filled 2020-03-05 (×4): qty 1

## 2020-03-05 NOTE — Progress Notes (Signed)
Patient ID: [name], female   DOB: 20-Jun-1940, 79 y.o.   MRN: [id number] [facility] PROGRESS NOTE  Margaret Black:[id number] DOB: May 18, 1940 DOA: 03/01/2020 PCP: Margaret Hauser, DO  HPI/Subjective: The patient thinks patient feeling a little bit better.  Has not passed gas or had a bowel movement yet.  The patient does not want to talk about any surgical approach at this point.  She states that she does not want a colostomy no matter what.  Admitted with acute diverticulitis.  Objective: Vitals:   03/05/20 0759 03/05/20 1356  BP: (!) 113/50 (!) 115/53  Pulse: 92 89  Resp: 18 19  Temp: 98.2 F (36.8 C) 98.2 F (36.8 C)  SpO2: 96% 99%    Intake/Output Summary (Last 24 hours) at 03/05/2020 1357 Last data filed at 03/05/2020 1038 Gross per 24 hour  Intake 486.83 ml  Output 1200 ml  Net -713.17 ml   Filed Weights   03/01/20 1007  Weight: 52.2 kg    ROS: Review of Systems  Respiratory: Negative for shortness of breath.   Cardiovascular: Negative for chest pain.  Gastrointestinal: Positive for abdominal pain and constipation. Negative for nausea and vomiting.   Exam: Physical Exam HENT:     Head: Normocephalic.     Mouth/Throat:     Pharynx: No oropharyngeal exudate.  Eyes:     General: Lids are normal.     Conjunctiva/sclera: Conjunctivae normal.     Pupils: Pupils are equal, round, and reactive to light.  Cardiovascular:     Rate and Rhythm: Normal rate and regular rhythm.     Heart sounds: Normal heart sounds, S1 normal and S2 normal.  Pulmonary:     Breath sounds: No decreased breath sounds, wheezing, rhonchi or rales.  Abdominal:     General: Bowel sounds are decreased.     Palpations: Abdomen is soft.     Tenderness: There is generalized abdominal tenderness.  Musculoskeletal:     Right lower leg: No swelling.     Left lower leg: No swelling.  Skin:    General: Skin is warm.     Findings: No rash.  Neurological:     Mental Status:  She is alert and oriented to person, place, and time.       Data Reviewed: Basic Metabolic Panel: Recent Labs  Lab 03/01/20 1153 03/02/20 0638 03/03/20 0750 03/04/20 0548 03/04/20 1759 03/05/20 0543  NA 136 135 134* 134*  --  134*  K 3.9 3.3* 3.6 3.4* 4.6 3.2*  CL 96* 98 103 104  --  101  CO2 25 23 23 24   --  24  GLUCOSE 157* 141* 87 168*  --  195*  BUN 19 20 21 16   --  16  CREATININE 2.07* 1.91* 1.68* 1.32*  --  1.44*  CALCIUM 8.8* 8.2* 8.1* 8.2*  --  8.4*  MG  --   --  1.6* 1.8  --  1.9  PHOS  --   --  2.3* 2.1* 4.8* 2.9   Liver Function Tests: Recent Labs  Lab 03/04/20 0548 03/05/20 0543  AST 16 16  ALT 27 19  ALKPHOS 161* 194*  BILITOT 1.1 1.0  PROT 5.1* 5.3*  ALBUMIN 2.2* 2.1*   CBC: Recent Labs  Lab 03/01/20 1407 03/02/20 0638 03/03/20 0750 03/04/20 0548 03/05/20 0543  WBC 10.1 9.6 9.9 12.3* 10.3  NEUTROABS 9.3*  --   --  11.5* 9.4*  HGB 10.4* 11.0* 9.5* 9.4* 10.1*  HCT  30.0* 32.3* 28.0* 26.9* 28.4*  MCV 92.3 95.3 94.0 92.4 92.2  PLT 298 273 266 263 277    CBG: Recent Labs  Lab 03/04/20 2046 03/05/20 0008 03/05/20 0457 03/05/20 0802 03/05/20 1217  GLUCAP 201* 194* 178* 178* 176*    Recent Results (from the past 240 hour(s))  SARS CORONAVIRUS 2 (TAT 6-24 HRS) Nasopharyngeal Nasopharyngeal Swab     Status: None   Collection Time: 02/28/20 10:26 AM   Specimen: Nasopharyngeal Swab  Result Value Ref Range Status   SARS Coronavirus 2 NEGATIVE NEGATIVE Final    Comment: (NOTE) SARS-CoV-2 target nucleic acids are NOT DETECTED.  The SARS-CoV-2 RNA is generally detectable in upper and lower respiratory specimens during the acute phase of infection. Negative results do not preclude SARS-CoV-2 infection, do not rule out co-infections with other pathogens, and should not be used as the sole basis for treatment or other patient management decisions. Negative results must be combined with clinical observations, patient history, and  epidemiological information. The expected result is Negative.  Fact Sheet for Patients: SugarRoll.be  Fact Sheet for Healthcare Providers: https://www.woods-mathews.com/  This test is not yet approved or cleared by the [place] FDA and  has been authorized for detection and/or diagnosis of SARS-CoV-2 by FDA under an Emergency Use Authorization (EUA). This EUA will remain  in effect (meaning this test can be used) for the duration of the COVID-19 declaration under Se ction 564(b)(1) of the Act, 21 U.S.C. section 360bbb-3(b)(1), unless the authorization is terminated or revoked sooner.  Performed at [facility], [place] [address], [place], [place] [address]       Scheduled Meds: . Chlorhexidine Gluconate Cloth  6 each Topical Daily  . insulin aspart  0-9 Units Subcutaneous Q6H  . levothyroxine  88 mcg Oral Q0600  . LORazepam  0.5 mg Oral QHS  . nebivolol  5 mg Oral Q2000  . NIFEdipine  60 mg Oral Q2000  . pantoprazole  20 mg Oral Daily  . sodium chloride flush  10-40 mL Intracatheter Q12H  . spironolactone  12.5 mg Oral Daily   Continuous Infusions: . piperacillin-tazobactam 100 mL/hr at 03/05/20 0522  . [COMPLETED] potassium chloride 10 mEq (03/05/20 1037)  . TPN ADULT (ION) 40 mL/hr at 03/05/20 0522  . TPN ADULT (ION)      Assessment/Plan:  1. Acute recurrent diverticulitis.  Patient failed outpatient treatment.  Continue IV Zosyn.  Patient has not passed gas or had a bowel movement so likely ileus also developing.  Patient on ice chips.  Patient has a PICC line and TPN for nutrition.  Patient absolutely does not want surgery with a colostomy.  Case discussed with general surgery team Dr. Lysle Black.  The patient does not even want to hear about surgical management at this point. 2. Acute kidney injury on chronic kidney disease stage IIIb.  Creatinine 2.07 on presentation and with IV fluid hydration did come down to 1.32.   Today's creatinine 1.44 with a GFR of 37. 3. Essential hypertension, hyperaldosteronism.  Patient on Bystolic, nifedipine and low-dose Aldactone 4. Hypothyroidism unspecified.  Continue Synthroid 5. GERD.  Continue Protonix 6. Anemia of chronic disease 7. Severe protein calorie malnutrition weight loss.  Continue TPN        Code Status:     Code Status Orders  (From admission, onward)         Start     Ordered   03/01/20 1309  Do not attempt resuscitation (DNR)  Continuous  Question Answer Comment  In the event of cardiac or respiratory ARREST Do not call a "code blue"   In the event of cardiac or respiratory ARREST Do not perform Intubation, CPR, defibrillation or ACLS   In the event of cardiac or respiratory ARREST Use medication by any route, position, wound care, and other measures to relive pain and suffering. May use oxygen, suction and manual treatment of airway obstruction as needed for comfort.   Comments CODE STATUS was discussed with patient who wishes to be placed on a DO NOT RESUSCITATE status      03/01/20 1311        Code Status History    Date Active Date Inactive Code Status Order ID Comments User Context   01/21/2020 2225 01/22/2020 1749 DNR [id number]  Lenore Cordia, MD ED   01/07/2020 [phone] 01/09/2020 1809 DNR [id number]  Collier Bullock, MD ED   Advance Care Planning Activity    Advance Directive Documentation   Flowsheet Row Most Recent Value  Type of Advance Directive Healthcare Power of [place], Living will  Pre-existing out of facility DNR order (yellow form or pink MOST form) --  "MOST" Form in Place? --     Family Communication: Spoke with husband on the phone Disposition Plan: Status is: Inpatient  Dispo: The patient is from: Home              Anticipated d/c is to: Home              Anticipated d/c date is: Cannot determine at this point.  Greater than 5 days estimation.              Patient currently being treated for recurrent  acute diverticulitis with IV Zosyn.  Patient n.p.o. at this point and on TPN  Antibiotics:  Zosyn  Time spent: 28 minutes, case discussed with general surgery  [place]  [facility]

## 2020-03-05 NOTE — Care Management Important Message (Signed)
Important Message  Patient Details  Name: Margaret Black MRN: [id number] Date of Birth: 06/05/1940   Medicare Important Message Given:  Yes     Dannette Barbara 03/05/2020, 11:47 AM

## 2020-03-05 NOTE — Progress Notes (Signed)
Initial Nutrition Assessment  DOCUMENTATION CODES:   Severe malnutrition in context of chronic illness  INTERVENTION:  Continue TPN per pharmacy.  Recommend measuring daily weights while on TPN to help monitor fluid status.  Monitor magnesium, potassium, and phosphorus daily for at least 3 days, MD to replete as needed, as pt is at risk for refeeding syndrome given severe malnutrition.  NUTRITION DIAGNOSIS:   Severe Malnutrition related to chronic illness (COPD, CKD, inadequate oral intake related to diverticulitis since 12/2019) as evidenced by energy intake < or equal to 75% for > or equal to 1 month, 13.4% weight loss over the past 4 months,severe fat depletion,moderate muscle depletion,severe muscle depletion.  Ongoing.  GOAL:   Patient will meet greater than or equal to 90% of their needs  Met with TPN.  MONITOR:   Diet advancement,Labs,Weight trends,I & O's  REASON FOR ASSESSMENT:   Malnutrition Screening Tool,Consult New TPN/TNA  ASSESSMENT:   79 year old female with medical history significant for GERD, HLD, HTN, collagen vascular disease, COPD, lung cancer s/p lobectomy 2001 (recently s/p XRT 11/2019),uternie cancer s/p hysterectomy 2001, osteopenia, hyperaldosteronism, Lupus, IBS, retinal vein occulusion of left eye,  hypothyroidism, SLE, CKD stage IV, and grade II diastolic dysfunction presented to hospital for outpt upper and lower endoscopy for evaluation of unintentional weight loss and poor appetite. Patient complaining of worsening lower abdominal pain on presentation and admitted for acute diverticulitis.  12/11 PICC placed and TPN initiated  Met with patient and her daughter at bedside. Patient reports she continues to feel week. Daughter reports that PTA patient had a decreased appetite and intake related to diverticulitis since approximately October 1st. Since then she was eating very small amounts at meals - usually only some mashed potatoes. Patient  reports her UBW was 128-130 lbs (58.2-59.1 kg) and that she has lost 15-20 lbs. Per chart patient was 60.3 kg on 10/24/2019 and is now 52.2 kg. She has lost 8.1 kg (13.4% body weight) over the past 4 months, which is significant for time frame. Discussed nutritional status. Per review of chart patient would like to hold off on surgical intervention at this time and does not want a colostomy. Plan is to continue TPN. Patient has been NPO since 12/9.   Medications reviewed and include: Novolog 0-9 units Q6hrs, levothyroxine, Protonix, spironolactone, Zosyn.  Labs reviewed: CBG 176-194, Sodium 134, Potassium 3.2, Creatinine 1.44. Phosphorus and Magnesium WNL.  IV Access: right brachial double lumen PICC placed 12/11  TPN regimen: Travasol 10% 55 grams/L, dextrose 21%, SMOFlipid 18 grams/L at 65 mL/hr (1737 kcal, 85.8 grams of protein, 28 grams of lipid)  I/O: 400 mL UOP Yesterday + 2 occurrences unmeasured UOP  Discussed with RN.  NUTRITION - FOCUSED PHYSICAL EXAM:  Flowsheet Row Most Recent Value  Orbital Region Severe depletion  Upper Arm Region Severe depletion  Thoracic and Lumbar Region Moderate depletion  Buccal Region Severe depletion  Temple Region Severe depletion  Clavicle Bone Region Severe depletion  Clavicle and Acromion Bone Region Severe depletion  Scapular Bone Region Moderate depletion  Dorsal Hand Severe depletion  Patellar Region Moderate depletion  Anterior Thigh Region Moderate depletion  Posterior Calf Region Moderate depletion  Edema (RD Assessment) None  Hair Reviewed  Eyes Reviewed  Mouth Reviewed  Skin Reviewed  Nails Reviewed     Diet Order:   Diet Order            Diet NPO time specified Except for: Sips with Meds, Ice Chips  Diet  effective now                EDUCATION NEEDS:   No education needs have been identified at this time  Skin:  Skin Assessment: Reviewed RN Assessment  Last BM:  03/01/2020 per chart  Height:   Ht Readings from  Last 1 Encounters:  03/01/20 5' 5" (1.651 m)   Weight:   Wt Readings from Last 1 Encounters:  03/01/20 52.2 kg   Ideal Body Weight:  56.8 kg  BMI:  Body mass index is 19.14 kg/m.  Estimated Nutritional Needs:   Kcal:  7829-5621  Protein:  80-95 grams  Fluid:  >/= 1.3 L  Jacklynn Barnacle, MS, RD, LDN Pager number available on Amion

## 2020-03-05 NOTE — Progress Notes (Addendum)
Subjective:  CC: Margaret Black is a 79 y.o. female  Hospital stay day 4,  diverticulitis  HPI: No acute issues overnight.  Having some ice chips, starting to feel bloated.  No flatus or BM recently  ROS:  General: Denies weight loss, weight gain, fatigue, fevers, chills, and night sweats. Heart: Denies chest pain, palpitations, racing heart, irregular heartbeat, leg pain or swelling, and decreased activity tolerance. Respiratory: Denies breathing difficulty, shortness of breath, wheezing, cough, and sputum. GI: Denies change in appetite, heartburn, nausea, vomiting, diarrhea, and blood in stool. GU: Denies difficulty urinating, pain with urinating, urgency, frequency, blood in urine.   Objective:   Temp:  [98.2 F (36.8 C)-98.9 F (37.2 C)] 98.2 F (36.8 C) (12/13 0759) Pulse Rate:  [86-93] 92 (12/13 0759) Resp:  [15-20] 18 (12/13 0759) BP: (106-141)/(42-53) 113/50 (12/13 0759) SpO2:  [93 %-98 %] 96 % (12/13 0759)     Height: 5\' 5"  (165.1 cm) Weight: 52.2 kg BMI (Calculated): 19.14   Intake/Output this shift:   Intake/Output Summary (Last 24 hours) at 03/05/2020 0831 Last data filed at 03/05/2020 0522 Gross per 24 hour  Intake 486.83 ml  Output 400 ml  Net 86.83 ml    Constitutional :  alert, cooperative, appears stated age and no distress  Respiratory:  clear to auscultation bilaterally  Cardiovascular:  regular rate and rhythm  Gastrointestinal: soft, no guarding, focal TTP persists in LLQ and suprapubic region. Unchanged from several days ago, now with obvious distention  Skin: Cool and moist.   Psychiatric: Normal affect, non-agitated, not confused       LABS:  CMP Latest Ref Rng & Units 03/05/2020 03/04/2020 03/04/2020  Glucose 70 - 99 mg/dL 195(H) - 168(H)  BUN 8 - 23 mg/dL 16 - 16  Creatinine 0.44 - 1.00 mg/dL 1.44(H) - 1.32(H)  Sodium 135 - 145 mmol/L 134(L) - 134(L)  Potassium 3.5 - 5.1 mmol/L 3.2(L) 4.6 3.4(L)  Chloride 98 - 111 mmol/L 101 - 104  CO2 22 -  32 mmol/L 24 - 24  Calcium 8.9 - 10.3 mg/dL 8.4(L) - 8.2(L)  Total Protein 6.5 - 8.1 g/dL 5.3(L) - 5.1(L)  Total Bilirubin 0.3 - 1.2 mg/dL 1.0 - 1.1  Alkaline Phos 38 - 126 U/L 194(H) - 161(H)  AST 15 - 41 U/L 16 - 16  ALT 0 - 44 U/L 19 - 27   CBC Latest Ref Rng & Units 03/05/2020 03/04/2020 03/03/2020  WBC 4.0 - 10.5 K/uL 10.3 12.3(H) 9.9  Hemoglobin 12.0 - 15.0 g/dL 10.1(L) 9.4(L) 9.5(L)  Hematocrit 36.0 - 46.0 % 28.4(L) 26.9(L) 28.0(L)  Platelets 150 - 400 K/uL 277 263 266    RADS: n/a Assessment:   Diverticulitis.  Pain reported to be improving but degree of tenderness unchanged on physical exam, now with increased bloating noted.  I explained to her my concerns for a developing ileus.  Leukocytosis is resolved again today.  Will need CT a/p, but with increased Cr today and patient refusal for IV contrast, will observe for another day and see if Cr drops below 1.5 and discuss importance of contrast study to locate possible devleoping abscess.  Recommend keeping n.p.o., TPN, IV abx for now.  Encourage ambulation and PT to see if bloating can improve as well.  UPDATE:  Primary updated me patient no longer will like to see me for continued care.  He will update me with any additional questions or concerns as needed.  I have discussed with patient previously it is ultimately  the surgeon who can recommend when surgical management is appropriate or not for diverticulitis.  I acknowledged her concerns of an ostomy creation, and discussed her goals of care, including returning to her daily activities prior to onset of diverticular disease. I reiterated multiple times throughout our past interactions that my goal as the surgeon is allow her to return to her daily activities with minimal interventions as possible, but also reminded her that diverticular disease can potentially advance to the point where the only effective definitive management is a surgical intervention.  I never have stated nor do  I recommend proceeding with any surgical intervention for now, but she is still at a point in her clinical progress that non-surgical intervention may not be enough to allow her to return to her daily routines. She verbalized understanding of all of the above and she did not voice any additional questions or concerns this am when I examined her.

## 2020-03-05 NOTE — Progress Notes (Signed)
PHARMACY - TOTAL PARENTERAL NUTRITION CONSULT NOTE   Indication: severe diverticulitis  Patient Measurements: Height: 5\' 5"  (165.1 cm) Weight: 52.2 kg (115 lb) IBW/kg (Calculated) : 57 TPN AdjBW (KG): 52.2 Body mass index is 19.14 kg/m.   Assessment:  recurrent diverticulitis, AKI. Per surgery note: this will be her third trial of antibiotics, second hospitalization. typical recommendation is to proceed with surgical resection of the colon.  Surgery of this type of thing resulted in a temporary colostomy due to the extensive inflammation making primary anastomosis higher risk.  Patient adamantly refusing a colostomy placement at this time.  Glucose / Insulin: BG 121-168  SSI q4h    SSI/24hr= 12 units required Electrolytes: slight hyponatremia, hypokalemia Renal: Scr 2.07 -->1.44 LFTs / TGs: LFTs WNL   TG 123-->150 Prealbumin / albumin:   Alb 2.2  preAlb 6.4 Intake / Output; MIVF:  GI Imaging: no recent Surgeries / Procedures: none this visit  Central access: PICC 03/03/20 TPN start date: 03/03/20  Nutritional Goals (per RD recommendation on 03/02/20): kCal: 1565-1830, Protein: 75-90 g, Fluid: >/= 1.3 L   Current Nutrition:  NPO   Plan:   increase TPN rate to 65 ml/hr at 1800 (total volume of TPN 1660 mL)  Nutritional Components: ? Protein: 55 G/L- Clinisol 10% (85.8 g/day)  ? Dextrose: 21 % (327.6 g/day) ? Lipids: 18 g/L (28 g/day)   ? kCal: 1737/day  Electrolytes in TPN (standard): 70mEq/L of Na, 25mEq/L of K, 31mEq/L of Ca, 62mEq/L of Mg, and 56mmol/L of Phos. Cl:Ac ratio 1:1  10 mEq IV KCl x 3  Add standard MVI and trace elements to TPN daily  Continue sensitive SSI q4h SSI and adjust as needed   No IVF currently  Monitor TPN labs on Mon/Thurs, daily until stable   [name] 03/05/2020,7:07 AM

## 2020-03-06 ENCOUNTER — Telehealth: Payer: Self-pay | Admitting: Licensed Clinical Social Worker

## 2020-03-06 ENCOUNTER — Encounter: Payer: Self-pay | Admitting: Internal Medicine

## 2020-03-06 ENCOUNTER — Inpatient Hospital Stay: Payer: Medicare Other

## 2020-03-06 ENCOUNTER — Telehealth: Payer: Medicare Other

## 2020-03-06 DIAGNOSIS — K56609 Unspecified intestinal obstruction, unspecified as to partial versus complete obstruction: Secondary | ICD-10-CM

## 2020-03-06 DIAGNOSIS — K5720 Diverticulitis of large intestine with perforation and abscess without bleeding: Principal | ICD-10-CM

## 2020-03-06 LAB — RENAL FUNCTION PANEL
Albumin: 2.2 g/dL — ABNORMAL LOW (ref 3.5–5.0)
Anion gap: 8 (ref 5–15)
BUN: 23 mg/dL (ref 8–23)
CO2: 24 mmol/L (ref 22–32)
Calcium: 8.6 mg/dL — ABNORMAL LOW (ref 8.9–10.3)
Chloride: 105 mmol/L (ref 98–111)
Creatinine, Ser: 1.43 mg/dL — ABNORMAL HIGH (ref 0.44–1.00)
GFR, Estimated: 37 mL/min — ABNORMAL LOW (ref >60)
Glucose, Bld: 222 mg/dL — ABNORMAL HIGH (ref 70–99)
Phosphorus: 2.7 mg/dL (ref 2.5–4.6)
Potassium: 4.3 mmol/L (ref 3.5–5.1)
Sodium: 137 mmol/L (ref 135–145)

## 2020-03-06 LAB — GLUCOSE, CAPILLARY
Glucose-Capillary: 216 mg/dL — ABNORMAL HIGH (ref 70–99)
Glucose-Capillary: 216 mg/dL — ABNORMAL HIGH (ref 70–99)
Glucose-Capillary: 221 mg/dL — ABNORMAL HIGH (ref 70–99)
Glucose-Capillary: 223 mg/dL — ABNORMAL HIGH (ref 70–99)

## 2020-03-06 LAB — MAGNESIUM: Magnesium: 1.8 mg/dL (ref 1.7–2.4)

## 2020-03-06 MED ORDER — IOHEXOL 9 MG/ML PO SOLN
500.0000 mL | ORAL | Status: AC
  Administered 2020-03-06 (×2): 500 mL via ORAL

## 2020-03-06 MED ORDER — NYSTATIN 100000 UNIT/ML MT SUSP
5.0000 mL | Freq: Four times a day (QID) | OROMUCOSAL | Status: DC
  Administered 2020-03-06 – 2020-03-18 (×29): 500000 [IU] via ORAL
  Filled 2020-03-06 (×38): qty 5

## 2020-03-06 MED ORDER — IOHEXOL 300 MG/ML  SOLN
75.0000 mL | Freq: Once | INTRAMUSCULAR | Status: AC | PRN
  Administered 2020-03-06: 75 mL via INTRAVENOUS

## 2020-03-06 MED ORDER — TRAVASOL 10 % IV SOLN
INTRAVENOUS | Status: AC
  Filled 2020-03-06: qty 858

## 2020-03-06 NOTE — Progress Notes (Signed)
Physical Therapy Treatment Patient Details Name: Margaret Black MRN: [id number] DOB: [birth year] Today's Date: 03/06/2020    History of Present Illness Pt is a 79 y/o F who presented to the hospital on 03/01/20 for endoscopy 2/2 unintentional weight loss & poor appetite. CT scan of abdomen and pelvis done without contrast showed  persistent evidence of diverticulitis. PMH: COPD, lung CA, SLE, Stage IV CKD, anxiety, L eye visual impairments, osteopenia, uterine CA, thyroid CA    PT Comments    Pt is making gradual progress towards goals with in room ambulation and seated there-ex. Very frustrated with NPO status. Pt prefers not to ambulate in hallway as she is drinking contrast for pending imaging. Will continue to progress as able.    Follow Up Recommendations  Home health PT     Equipment Recommendations  None recommended by PT    Recommendations for Other Services       Precautions / Restrictions Precautions Precautions: Fall Restrictions Weight Bearing Restrictions: No    Mobility  Bed Mobility Overal bed mobility: Modified Independent             General bed mobility comments: safe technique with upright posture  Transfers Overall transfer level: Modified independent Equipment used: None             General transfer comment: safe technique with upright posture  Ambulation/Gait Ambulation/Gait assistance: Supervision Gait Distance (Feet): 20 Feet Assistive device: None Gait Pattern/deviations: Step-through pattern     General Gait Details: ambulated around room with this writer assisting with IV pole. Safe technique and slow speed. Pt preferred to stay in room as she needed to drink contrast for imaging   Stairs             Wheelchair Mobility    Modified Rankin (Stroke Patients Only)       Balance Overall balance assessment: Modified Independent                                          Cognition Arousal/Alertness:  Awake/alert Behavior During Therapy: WFL for tasks assessed/performed Overall Cognitive Status: Within Functional Limits for tasks assessed                                 General Comments: frustrated due to NPO status      Exercises Other Exercises Other Exercises: Seated ther-ex performed while in recliner including B LAQ, heel raises, toe raises, hip add squeezes, shoulder press, and bicep curls. All ther-ex performed x 10 reps with supervision.    General Comments        Pertinent Vitals/Pain Pain Assessment: Faces Faces Pain Scale: Hurts whole lot Pain Location: lower abdomen Pain Descriptors / Indicators: Aching Pain Intervention(s): Limited activity within patient's tolerance    Home Living                      Prior Function            PT Goals (current goals can now be found in the care plan section) Acute Rehab PT Goals Patient Stated Goal: go home PT Goal Formulation: With patient Time For Goal Achievement: 03/17/20 Potential to Achieve Goals: Fair Progress towards PT goals: Progressing toward goals    Frequency    Min 2X/week      PT  Plan Current plan remains appropriate    Co-evaluation              AM-PAC PT "6 Clicks" Mobility   Outcome Measure  Help needed turning from your back to your side while in a flat bed without using bedrails?: None Help needed moving from lying on your back to sitting on the side of a flat bed without using bedrails?: None Help needed moving to and from a bed to a chair (including a wheelchair)?: A Little Help needed standing up from a chair using your arms (e.g., wheelchair or bedside chair)?: None Help needed to walk in hospital room?: A Little Help needed climbing 3-5 steps with a railing? : A Little 6 Click Score: 21    End of Session   Activity Tolerance: Patient tolerated treatment well Patient left: in chair;with chair alarm set Nurse Communication: Mobility status PT Visit  Diagnosis: Unsteadiness on feet (R26.81);Muscle weakness (generalized) (M62.81)     Time: 4098-1191 PT Time Calculation (min) (ACUTE ONLY): 23 min  Charges:  $Gait Training: 8-22 mins $Therapeutic Exercise: 8-22 mins                     Elizabeth Palau, PT, DPT [phone]    Margaret Black 03/06/2020, 12:47 PM

## 2020-03-06 NOTE — Progress Notes (Signed)
[name], female   DOB: 1940-06-05, 79 y.o.   MRN: [id number] [facility] PROGRESS NOTE  Margaret Black KGY:[id number] DOB: 1940/11/10 DOA: 03/01/2020 PCP: Olin Hauser, DO  HPI/Subjective: Patient seen this morning and again this afternoon after CT resulted.  CT showing developing abscess and small bowel obstruction.  Patient was admitted with acute diverticulitis.  Objective: Vitals:   03/06/20 0743 03/06/20 1206  BP: (!) 131/56 (!) 137/54  Pulse: 84 89  Resp: 16 16  Temp: 98 F (36.7 C) 98 F (36.7 C)  SpO2: 97% 98%    Intake/Output Summary (Last 24 hours) at 03/06/2020 1449 Last data filed at 03/06/2020 1200 Gross per 24 hour  Intake 0 ml  Output 2350 ml  Net -2350 ml   Filed Weights   03/01/20 1007  Weight: 52.2 kg    ROS: Review of Systems  Respiratory: Negative for shortness of breath.   Cardiovascular: Negative for chest pain.  Gastrointestinal: Positive for abdominal pain, constipation and nausea. Negative for vomiting.   Exam: Physical Exam HENT:     Head: Normocephalic.     Mouth/Throat:     Pharynx: No oropharyngeal exudate.  Eyes:     General: Lids are normal.     Conjunctiva/sclera: Conjunctivae normal.     Pupils: Pupils are equal, round, and reactive to light.  Cardiovascular:     Rate and Rhythm: Normal rate and regular rhythm.     Heart sounds: Normal heart sounds, S1 normal and S2 normal.  Pulmonary:     Breath sounds: Normal breath sounds. No decreased breath sounds, wheezing, rhonchi or rales.  Abdominal:     General: Bowel sounds are decreased.     Palpations: Abdomen is soft.     Tenderness: There is abdominal tenderness.  Musculoskeletal:     Right lower leg: No swelling.     Left lower leg: No swelling.  Skin:    General: Skin is warm.     Findings: No rash.  Neurological:     Mental Status: She is alert and oriented to person, place, and time.       Data Reviewed: Basic Metabolic  Panel: Recent Labs  Lab 03/02/20 0638 03/03/20 0750 03/04/20 0548 03/04/20 1759 03/05/20 0543 03/06/20 0527  NA 135 134* 134*  --  134* 137  K 3.3* 3.6 3.4* 4.6 3.2* 4.3  CL 98 103 104  --  101 105  CO2 23 23 24   --  24 24  GLUCOSE 141* 87 168*  --  195* 222*  BUN 20 21 16   --  16 23  CREATININE 1.91* 1.68* 1.32*  --  1.44* 1.43*  CALCIUM 8.2* 8.1* 8.2*  --  8.4* 8.6*  MG  --  1.6* 1.8  --  1.9 1.8  PHOS  --  2.3* 2.1* 4.8* 2.9 2.7   Liver Function Tests: Recent Labs  Lab 03/04/20 0548 03/05/20 0543 03/06/20 0527  AST 16 16  --   ALT 27 19  --   ALKPHOS 161* 194*  --   BILITOT 1.1 1.0  --   PROT 5.1* 5.3*  --   ALBUMIN 2.2* 2.1* 2.2*   CBC: Recent Labs  Lab 03/01/20 1407 03/02/20 0638 03/03/20 0750 03/04/20 0548 03/05/20 0543  WBC 10.1 9.6 9.9 12.3* 10.3  NEUTROABS 9.3*  --   --  11.5* 9.4*  HGB 10.4* 11.0* 9.5* 9.4* 10.1*  HCT 30.0* 32.3* 28.0* 26.9* 28.4*  MCV 92.3 95.3 94.0 92.4  92.2  PLT 298 273 266 263 277    CBG: Recent Labs  Lab 03/05/20 0802 03/05/20 1217 03/05/20 1839 03/06/20 0048 03/06/20 1229  GLUCAP 178* 176* 185* 221* 216*    Recent Results (from the past 240 hour(s))  SARS CORONAVIRUS 2 (TAT 6-24 HRS) Nasopharyngeal Nasopharyngeal Swab     Status: None   Collection Time: 02/28/20 10:26 AM   Specimen: Nasopharyngeal Swab  Result Value Ref Range Status   SARS Coronavirus 2 NEGATIVE NEGATIVE Final    Comment: (NOTE) SARS-CoV-2 target nucleic acids are NOT DETECTED.  The SARS-CoV-2 RNA is generally detectable in upper and lower respiratory specimens during the acute phase of infection. Negative results do not preclude SARS-CoV-2 infection, do not rule out co-infections with other pathogens, and should not be used as the sole basis for treatment or other patient management decisions. Negative results must be combined with clinical observations, patient history, and epidemiological information. The expected result is  Negative.  Fact Sheet for Patients: SugarRoll.be  Fact Sheet for Healthcare Providers: https://www.woods-mathews.com/  This test is not yet approved or cleared by the [place] FDA and  has been authorized for detection and/or diagnosis of SARS-CoV-2 by FDA under an Emergency Use Authorization (EUA). This EUA will remain  in effect (meaning this test can be used) for the duration of the COVID-19 declaration under Se ction 564(b)(1) of the Act, 21 U.S.C. section 360bbb-3(b)(1), unless the authorization is terminated or revoked sooner.  Performed at [facility], [place] [address] [place], [place] [address]      Studies: CT ABDOMEN PELVIS W CONTRAST  Result Date: 03/06/2020 CLINICAL DATA:  Acute recurrent diverticulitis. Diverticulitis, complication suspected. EXAM: CT ABDOMEN AND PELVIS WITH CONTRAST TECHNIQUE: Multidetector CT imaging of the abdomen and pelvis was performed using the standard protocol following bolus administration of intravenous contrast. CONTRAST:  41mL OMNIPAQUE IOHEXOL 300 MG/ML  SOLN COMPARISON:  CT of the abdomen and pelvis 03/01/2020 and 01/07/2020 FINDINGS: Lower chest: Bilateral pleural effusions are now present, right greater than left. Mild dependent atelectasis present bilaterally. Centrilobular emphysematous changes are again noted. Heart size is normal. No significant pericardial effusion is present. Artery calcifications are again noted. Hepatobiliary: Progressive intra and extrahepatic biliary dilation is noted. Common bile duct measures up to 12.5 mm. Obstructing lesion is present. Patient is status post cholecystectomy. Simple cyst in the periphery of the right lobe of the liver measures 10 mm. Pancreas: Mild pancreatic duct dilation is stable. No discrete lesion is present. Mass lesion is present. Spleen: Normal in size without focal abnormality. Adrenals/Urinary Tract: Adrenal glands are normal bilaterally.  Kidneys and ureters are within normal limits. No obstruction or stone is present. No mass lesion is present. The urinary bladder is within normal limits. Stomach/Bowel: Stomach is mildly distended. Density in the duodenum may represent tablet. Proximal small bowel is dilated. Fluid levels are present. Loops of small bowel measure up to 4.2 cm. Point of obstruction is centered in the pelvis associated with an extraluminal fluid collection. More distal small bowel is collapsed. The ascending and transverse colon are within normal limits. Diverticular changes are present in the descending colon. More prominent diverticular changes are present in the sigmoid colon is previously seen with inflammatory changes, now associated with the extraluminal collection. Vascular/Lymphatic: Atherosclerotic calcifications are present in the aorta and branch vessels without aneurysm. Reproductive: Prostate is unremarkable. Other: Extraluminal loculated fluid collection with air-fluid levels adjacent to the inflamed sigmoid colon measures 2.9 x 6.8 x 3.5 cm  maximally. The collection is not accessible via a percutaneous route due to overlying bowel. Musculoskeletal: Vertebral body heights and alignment are normal. Bony pelvis is within limits. Hips are located and within normal limits. IMPRESSION: 1. Extraluminal loculated fluid collection with air-fluid levels adjacent to the inflamed sigmoid colon measures 2.9 x 6.8 x 3.5 cm maximally. The collection is not accessible via a percutaneous route due to overlying bowel. This is consistent with a developing abscess. 2. Small bowel obstruction with transition point centered in the pelvis associated with the extraluminal fluid collection. 3. Progressive intra and extrahepatic biliary dilation without an obstructing lesion. 4. Bilateral pleural effusions, right greater than left. 5. Aortic Atherosclerosis (ICD10-I70.0) and Emphysema (ICD10-J43.9). Electronically Signed   By: San Morelle M.D.   On: 03/06/2020 13:56    Scheduled Meds:  Chlorhexidine Gluconate Cloth  6 each Topical Daily   insulin aspart  0-9 Units Subcutaneous Q6H   levothyroxine  88 mcg Oral Q0600   LORazepam  0.5 mg Oral QHS   nebivolol  5 mg Oral Q2000   NIFEdipine  60 mg Oral Q2000   nystatin  5 mL Oral QID   pantoprazole  20 mg Oral Daily   sodium chloride flush  10-40 mL Intracatheter Q12H   spironolactone  12.5 mg Oral Daily   Continuous Infusions:  sodium chloride 50 mL/hr at 03/06/20 1036   piperacillin-tazobactam 3.375 g (03/06/20 0552)   TPN ADULT (ION) 65 mL/hr at 03/05/20 1836   TPN ADULT (ION)      Assessment/Plan:  1. Acute recurrent diverticulitis.  CT scan today showing developing abscess and small bowel obstruction.  Case discussed with radiologist and IR does not believe they have a window to do a drainage.  The patient currently on IV Zosyn and has previous failed outpatient therapy.  Patient has a PICC line and TPN for nutrition.  The patient will discuss things with her family on how to proceed.  I messaged surgical team about the CAT scan results.  Patient still hesitant about colostomy.  I told her that I do not think antibiotics alone will treat this infection at this point. 2. Acute kidney injury on chronic kidney disease stage IIIb.  Creatinine 2.07 on presentation.  Creatinine 1.43 today.  Continue IV fluid hydration along with TPN today and recheck creatinine tomorrow since the patient did receive IV contrast. 3. Essential hypertension hyperaldosteronism on Bystolic, nifedipine and low-dose Aldactone 4. Hypothyroidism unspecified on Synthroid 5. GERD on Protonix 6. Anemia of chronic disease 7. Severe protein calorie malnutrition and weight loss.  On TPN      Code Status:     Code Status Orders  (From admission, onward)         Start     Ordered   03/01/20 1309  Do not attempt resuscitation (DNR)  Continuous       Question Answer Comment   In the event of cardiac or respiratory ARREST Do not call a code blue   In the event of cardiac or respiratory ARREST Do not perform Intubation, CPR, defibrillation or ACLS   In the event of cardiac or respiratory ARREST Use medication by any route, position, wound care, and other measures to relive pain and suffering. May use oxygen, suction and manual treatment of airway obstruction as needed for comfort.   Comments CODE STATUS was discussed with patient who wishes to be placed on a DO NOT RESUSCITATE status      03/01/20 1311  Code Status History    Date Active Date Inactive Code Status Order ID Comments User Context   01/21/2020 2225 01/22/2020 1749 DNR [id number]  Lenore Cordia, MD ED   01/07/2020 [id number] 01/09/2020 1809 DNR [id number]  Collier Bullock, MD ED   Advance Care Planning Activity    Advance Directive Documentation   Flowsheet Row Most Recent Value  Type of Advance Directive Healthcare Power of Attorney, Living will  Pre-existing out of facility DNR order (yellow form or pink MOST form) --  "MOST" Form in Place? --     Family Communication: Spoke with patient's daughter at the bedside and husband on the phone Disposition Plan: Status is: Inpatient  Dispo: The patient is from: Home              Anticipated d/c is to: Home              Anticipated d/c date is: Unable to be determined at this point              Patient currently being treated with IV Zosyn for diverticulitis with developing abscess.  Consultants:  General surgery  Antibiotics:  IV Zosyn  Time spent: 40 minutes  [place]

## 2020-03-06 NOTE — H&P (View-Only) (Signed)
[facility] Day(s): 5.   Post op day(s): 5 Days Post-Op.   Interval History: Patient seen and examined today.  Daughter was at bedside.  Patient report that she is feeling more distended.  She is still feeling a lot of pressure in the lower abdomen.  There is no pain radiation.  There has been no alleviating or aggravating factor.  She denies any fever.  She reports that the swelling and distention in the abdomen is new.  Patient had a CT scan of the abdomen and pelvis done today.  This showed a new abscess formation deep in the center of the abdomen.  There is no free air.  There is small bowel dilation.  Vital signs in last 24 hours: [min-max] current  Temp:  [98 F (36.7 C)-98.9 F (37.2 C)] 98 F (36.7 C) (12/14 1206) Pulse Rate:  [84-89] 89 (12/14 1206) Resp:  [16-20] 16 (12/14 1206) BP: (112-137)/(46-56) 137/54 (12/14 1206) SpO2:  [95 %-98 %] 98 % (12/14 1206)     Height: 5\' 5"  (165.1 cm) Weight: 52.2 kg BMI (Calculated): 19.14   Physical Exam:  Constitutional: alert, cooperative and no distress  Respiratory: breathing non-labored at rest  Cardiovascular: regular rate and sinus rhythm  Gastrointestinal: soft, tender in the lower abdomen, and distended  Labs:  CBC Latest Ref Rng & Units 03/05/2020 03/04/2020 03/03/2020  WBC 4.0 - 10.5 K/uL 10.3 12.3(H) 9.9  Hemoglobin 12.0 - 15.0 g/dL 10.1(L) 9.4(L) 9.5(L)  Hematocrit 36.0 - 46.0 % 28.4(L) 26.9(L) 28.0(L)  Platelets 150 - 400 K/uL 277 263 266   CMP Latest Ref Rng & Units 03/06/2020 03/05/2020 03/04/2020  Glucose 70 - 99 mg/dL 222(H) 195(H) -  BUN 8 - 23 mg/dL 23 16 -  Creatinine 0.44 - 1.00 mg/dL 1.43(H) 1.44(H) -  Sodium 135 - 145 mmol/L 137 134(L) -  Potassium 3.5 - 5.1 mmol/L 4.3 3.2(L) 4.6  Chloride 98 - 111 mmol/L 105 101 -  CO2 22 - 32 mmol/L 24 24 -  Calcium 8.9 - 10.3 mg/dL 8.6(L) 8.4(L) -  Total Protein 6.5 - 8.1 g/dL - 5.3(L) -  Total Bilirubin 0.3 - 1.2 mg/dL - 1.0 -  Alkaline Phos  38 - 126 U/L - 194(H) -  AST 15 - 41 U/L - 16 -  ALT 0 - 44 U/L - 19 -    Imaging studies: I personally evaluated the CT scan of the abdomen and pelvis  I identified the 6 cm abscess medial to the sigmoid colon.  I agree with radiologist that the does not look amenable for percutaneous drainage.  There is some ascites identified around the liver.  There is small bowel dilation.  There is no free air.   Assessment/Plan:  79 y.o. female with recurrent diverticulitis now complicated with abscess formation, complicated by pertinent comorbidities including anxiety disorder, chronic kidney disease, COPD, hypertension, hypothyroidism, irritable bowel syndrome, history of lung cancer, lupus.  During this evaluation the patient was complete understanding of the new situation.  She was very apologetic about the previous behavior with the previous Psychologist, sport and exercise.  Initially she was completely against having a new colostomy and she mentioned that she preferred to die before having a colostomy.  After understanding the whole process and having a discussion with the family she understand that the best way for her to heal and get better and be able to be with family is to have this colostomy.  I had a long discussion about the surgical management.  I also have a long discussion with the patient about the life with a colostomy.  We discussed about the possibility of reversal in the future hoping that this is not a permanent colostomy.  After additional discussion the patient understood and agreed to proceed.  I discussed with the patient the risks that include infection, bleeding, intra-abdominal abscess, injury to adjacent organs such as intestine, bladder, kidneys, liver among others.  Patient currently on antibiotic therapy.  Her white blood cell count, hemoglobin and renal function is adequate.  There is no electrolyte disturbance.  We will coordinate surgery for tomorrow for partial colectomy and creation of end  colostomy.  Arnold Long, MD

## 2020-03-06 NOTE — Progress Notes (Signed)
Inpatient Diabetes Program Recommendations  AACE/ADA: New Consensus Statement on Inpatient Glycemic Control   Target Ranges:  Prepandial:   less than 140 mg/dL      Peak postprandial:   less than 180 mg/dL (1-2 hours)      Critically ill patients:  140 - 180 mg/dL  Results for Margaret Black, Margaret Black (MRN [id number]) as of 03/06/2020 09:48  Ref. Range 03/05/2020 04:57 03/05/2020 08:02 03/05/2020 12:17 03/05/2020 18:39 03/06/2020 00:48  Glucose-Capillary Latest Ref Range: 70 - 99 mg/dL 178 (H) 178 (H) 176 (H) 185 (H) 221 (H)    Review of Glycemic Control  Current orders for Inpatient glycemic control: Novolog 0-9 units Q6H; TPN @ 40 ml/hr  Inpatient Diabetes Program Recommendations:    Insulin: Please consider adding Regular 10 units to TPN.  Thanks, Barnie Alderman, RN, MSN, CDE Diabetes Coordinator Inpatient Diabetes Program [phone] (Team Pager from 8am to 5pm)

## 2020-03-06 NOTE — Telephone Encounter (Signed)
Chronic Care Management    Clinical Social Work General Follow Up Note  03/06/2020 Name: DENZIL MAGANA MRN: [id number] DOB: 07/04/1940  Margaret Black is a 79 y.o. year old female who is a primary care patient of Smitty Cords, DO. The CCM team was consulted for assistance with [facility] .   Review of patient status, including review of consultants reports, relevant laboratory and other test results, and collaboration with appropriate care team members and the patient's provider was performed as part of comprehensive patient evaluation and provision of chronic care management services.    LCSW completed CCM outreach attempt today but was unable to reach patient successfully. A HIPPA compliant voice message was left encouraging patient to return call once available. LCSW will ask Scheduling Care Guide to reschedule CCM SW appointment with patient as well.  Facility-Administered Encounter Medications as of 03/06/2020  Medication  . 0.9 %  sodium chloride infusion  . acetaminophen (TYLENOL) tablet 650 mg  . ALPRAZolam (XANAX) tablet 0.25 mg  . Chlorhexidine Gluconate Cloth 2 % PADS 6 each  . insulin aspart (novoLOG) injection 0-9 Units  . levothyroxine (SYNTHROID) tablet 88 mcg  . LORazepam (ATIVAN) tablet 0.5 mg  . morphine 2 MG/ML injection 2 mg  . nebivolol (BYSTOLIC) tablet 5 mg  . NIFEdipine (ADALAT CC) 24 hr tablet 60 mg  . nystatin (MYCOSTATIN) 100000 UNIT/ML suspension 500,000 Units  . ondansetron (ZOFRAN) tablet 4 mg   Or  . ondansetron (ZOFRAN) injection 4 mg  . oxyCODONE (Oxy IR/ROXICODONE) immediate release tablet 5 mg  . pantoprazole (PROTONIX) EC tablet 20 mg  . piperacillin-tazobactam (ZOSYN) IVPB 3.375 g  . sodium chloride flush (NS) 0.9 % injection 10-40 mL  . sodium chloride flush (NS) 0.9 % injection 10-40 mL  . spironolactone (ALDACTONE) tablet 12.5 mg  . TPN ADULT (ION)   Outpatient Encounter Medications as of 03/06/2020  Medication Sig  .  acidophilus (RISAQUAD) CAPS capsule 2 capsules daily (okay to substitute any probiotic) (Patient not taking: Reported on 03/01/2020)  . BYSTOLIC 5 MG tablet Take 1 tablet (5 mg total) by mouth daily.  Marland Kitchen CALCIUM-VITAMIN D PO Take 1 tablet by mouth daily.  (Patient not taking: Reported on 02/07/2020)  . enalapril (VASOTEC) 5 MG tablet Take 5 mg by mouth at bedtime.  . hydrocortisone (ANUSOL-HC) 2.5 % rectal cream Place rectally 3 (three) times daily. (Patient not taking: Reported on 03/01/2020)  . lansoprazole (PREVACID) 15 MG capsule Take 1 capsule (15 mg total) by mouth daily before breakfast.  . LORazepam (ATIVAN) 0.5 MG tablet Take 1 tablet (0.5 mg total) by mouth at bedtime.  . Multiple Vitamin (MULTIVITAMIN) tablet Take 1 tablet by mouth daily.  (Patient not taking: Reported on 02/07/2020)  . NIFEdipine (PROCARDIA XL/NIFEDICAL XL) 60 MG 24 hr tablet TAKE 1 TABLET EVERY EVENING (Patient taking differently: Take 60 mg by mouth in the morning. TAKE 1 TABLET EVERY EVENING)  . ondansetron (ZOFRAN ODT) 4 MG disintegrating tablet Take 1 tablet (4 mg total) by mouth every 8 (eight) hours as needed for nausea or vomiting.  Marland Kitchen spironolactone (ALDACTONE) 25 MG tablet Hold this medication until followup with your outpatient doctor due to dehydration.  . sucralfate (CARAFATE) 1 g tablet Take 1 tablet (1 g total) by mouth 4 (four) times daily -  with meals and at bedtime. (Patient not taking: Reported on 02/07/2020)  . SYNTHROID 88 MCG tablet Take 1 tablet (88 mcg total) by mouth daily before breakfast.  . vitamin  C (ASCORBIC ACID) 500 MG tablet Take 500 mg by mouth daily.  (Patient not taking: Reported on 02/07/2020)    Follow Up Plan: Scheduling Care Guide will reach out to patient to reschedule appointment.   Dickie La, BSW, MSW, LCSW [facility] [place]  [facility] [email or web address] Phone: [phone]

## 2020-03-06 NOTE — Progress Notes (Signed)
PHARMACY - TOTAL PARENTERAL NUTRITION CONSULT NOTE   Indication: severe diverticulitis  Patient Measurements: Height: 5\' 5"  (165.1 cm) Weight: 52.2 kg (115 lb) IBW/kg (Calculated) : 57 TPN AdjBW (KG): 52.2 Body mass index is 19.14 kg/m.   Assessment:  recurrent diverticulitis,  per surgery note: this will be her third trial of antibiotics, second hospitalization. typical recommendation is to proceed with surgical resection of the colon.  Surgery of this type of thing resulted in a temporary colostomy due to the extensive inflammation making primary anastomosis higher risk.  Patient adamantly refusing a colostomy placement at this time.  Glucose / Insulin: BG 195 - 221  SSI  SSI/24hr= 9 units required Electrolytes: WNL Renal: Scr 2.07 -->1.43 LFTs / TGs: LFTs WNL   TG 123-->150 Prealbumin / albumin:   Alb 2.2  preAlb 6.4 Intake / Output; MIVF: intake not recorded 0.9% NaCl at 50 mL/hr GI Imaging: no recent Surgeries / Procedures: none this visit  Central access: PICC 03/03/20 TPN start date: 03/03/20  Nutritional Goals (per RD recommendation on 03/05/20): kCal: 1565-1830, Protein: 80-95 g, Fluid: >/= 1.3 L   Current Nutrition:  NPO   Plan:   continue TPN rate at 65 ml/hr (total volume 1660 mL)  Nutritional Components: ? Protein: 55 G/L- Clinisol 10% (85.8 g/day)  ? Dextrose: 21 % (327.6 g/day) ? Lipids: 18 g/L (28 g/day)   ? kCal: 1737/day  Electrolytes in TPN (standard): 36mEq/L of Na, 42mEq/L of K, 29mEq/L of Ca, 65mEq/L of Mg, and 39mmol/L of Phos. Cl:Ac ratio 1:1  Add 10 units regular insulin to TPN  Add standard MVI and trace elements to TPN daily  Continue sensitive SSI q6h SSI and adjust as needed   MIVF: 0.9% NaCl at 50 mL/hr  Monitor TPN labs on Mon/Thurs, daily until stable   [name] 03/06/2020,7:21 AM

## 2020-03-06 NOTE — Progress Notes (Signed)
Occupational Therapy Treatment Patient Details Name: Margaret Black MRN: [id number] DOB: 22-Jul-1940 Today's Date: 03/06/2020    History of present illness Pt is a 79 y/o F who presented to the hospital on 03/01/20 for endoscopy 2/2 unintentional weight loss & poor appetite. CT scan of abdomen and pelvis done without contrast showed  persistent evidence of diverticulitis. PMH: COPD, lung CA, SLE, Stage IV CKD, anxiety, L eye visual impairments, osteopenia, uterine CA, thyroid CA   OT comments  Upon entering the room, pt supine in bed and needing maximal encouragement from daughter present in the room. Pt agreeable to B UE strengthening exercises with use of yellow, level 1 theraband. Pt does report rotator cuff tear in R UE. Pt performed 3 sets of 10 chest pulls, 3 sets of 15 shoulder elevation and shoulder diagonals on L UE. Pt needing min cuing for proper technique. At this point, MD arrived to review recent test with pt and family. Therefore session terminated and yellow theraband left with pt for her use. All needs within reach and HHOT recommendation remains accurate.   Follow Up Recommendations  Supervision - Intermittent;Home health OT    Equipment Recommendations  3 in 1 bedside commode       Precautions / Restrictions Precautions Precautions: Fall              ADL either performed or assessed with clinical judgement        Vision Patient Visual Report: No change from baseline            Cognition Arousal/Alertness: Awake/alert Behavior During Therapy: WFL for tasks assessed/performed Overall Cognitive Status: Within Functional Limits for tasks assessed             General Comments: frustrated due to NPO status                   Pertinent Vitals/ Pain       Pain Assessment: Faces Faces Pain Scale: No hurt         Frequency  Min 1X/week        Progress Toward Goals  OT Goals(current goals can now be found in the care plan section)  Progress  towards OT goals: Progressing toward goals  Acute Rehab OT Goals Patient Stated Goal: go home OT Goal Formulation: With patient/family Time For Goal Achievement: 03/17/20 Potential to Achieve Goals: Good  Plan Discharge plan remains appropriate       AM-PAC OT "6 Clicks" Daily Activity     Outcome Measure   Help from another person eating meals?: None Help from another person taking care of personal grooming?: A Little Help from another person toileting, which includes using toliet, bedpan, or urinal?: A Little Help from another person bathing (including washing, rinsing, drying)?: A Little Help from another person to put on and taking off regular upper body clothing?: None Help from another person to put on and taking off regular lower body clothing?: A Lot 6 Click Score: 19    End of Session Equipment Utilized During Treatment: Other (comment) (theraband)  OT Visit Diagnosis: Other abnormalities of gait and mobility (R26.89)   Activity Tolerance Patient tolerated treatment well   Patient Left in chair;with call bell/phone within reach;with chair alarm set;with family/visitor present   Nurse Communication Mobility status        Time: 6629-4765 OT Time Calculation (min): 12 min  Charges: OT General Charges $OT Visit: 1 Visit OT Treatments $Therapeutic Exercise: 8-22 mins  Darleen Crocker, MS, OTR/L ,  CBIS ascom [phone]  03/06/20, 4:50 PM

## 2020-03-06 NOTE — Progress Notes (Signed)
[facility] Day(s): 5.   Post op day(s): 5 Days Post-Op.   Interval History: Patient seen and examined today.  Daughter was at bedside.  Patient report that she is feeling more distended.  She is still feeling a lot of pressure in the lower abdomen.  There is no pain radiation.  There has been no alleviating or aggravating factor.  She denies any fever.  She reports that the swelling and distention in the abdomen is new.  Patient had a CT scan of the abdomen and pelvis done today.  This showed a new abscess formation deep in the center of the abdomen.  There is no free air.  There is small bowel dilation.  Vital signs in last 24 hours: [min-max] current  Temp:  [98 F (36.7 C)-98.9 F (37.2 C)] 98 F (36.7 C) (12/14 1206) Pulse Rate:  [84-89] 89 (12/14 1206) Resp:  [16-20] 16 (12/14 1206) BP: (112-137)/(46-56) 137/54 (12/14 1206) SpO2:  [95 %-98 %] 98 % (12/14 1206)     Height: 5\' 5"  (165.1 cm) Weight: 52.2 kg BMI (Calculated): 19.14   Physical Exam:  Constitutional: alert, cooperative and no distress  Respiratory: breathing non-labored at rest  Cardiovascular: regular rate and sinus rhythm  Gastrointestinal: soft, tender in the lower abdomen, and distended  Labs:  CBC Latest Ref Rng & Units 03/05/2020 03/04/2020 03/03/2020  WBC 4.0 - 10.5 K/uL 10.3 12.3(H) 9.9  Hemoglobin 12.0 - 15.0 g/dL 10.1(L) 9.4(L) 9.5(L)  Hematocrit 36.0 - 46.0 % 28.4(L) 26.9(L) 28.0(L)  Platelets 150 - 400 K/uL 277 263 266   CMP Latest Ref Rng & Units 03/06/2020 03/05/2020 03/04/2020  Glucose 70 - 99 mg/dL 222(H) 195(H) -  BUN 8 - 23 mg/dL 23 16 -  Creatinine 0.44 - 1.00 mg/dL 1.43(H) 1.44(H) -  Sodium 135 - 145 mmol/L 137 134(L) -  Potassium 3.5 - 5.1 mmol/L 4.3 3.2(L) 4.6  Chloride 98 - 111 mmol/L 105 101 -  CO2 22 - 32 mmol/L 24 24 -  Calcium 8.9 - 10.3 mg/dL 8.6(L) 8.4(L) -  Total Protein 6.5 - 8.1 g/dL - 5.3(L) -  Total Bilirubin 0.3 - 1.2 mg/dL - 1.0 -  Alkaline Phos  38 - 126 U/L - 194(H) -  AST 15 - 41 U/L - 16 -  ALT 0 - 44 U/L - 19 -    Imaging studies: I personally evaluated the CT scan of the abdomen and pelvis  I identified the 6 cm abscess medial to the sigmoid colon.  I agree with radiologist that the does not look amenable for percutaneous drainage.  There is some ascites identified around the liver.  There is small bowel dilation.  There is no free air.   Assessment/Plan:  79 y.o. female with recurrent diverticulitis now complicated with abscess formation, complicated by pertinent comorbidities including anxiety disorder, chronic kidney disease, COPD, hypertension, hypothyroidism, irritable bowel syndrome, history of lung cancer, lupus.  During this evaluation the patient was complete understanding of the new situation.  She was very apologetic about the previous behavior with the previous Psychologist, sport and exercise.  Initially she was completely against having a new colostomy and she mentioned that she preferred to die before having a colostomy.  After understanding the whole process and having a discussion with the family she understand that the best way for her to heal and get better and be able to be with family is to have this colostomy.  I had a long discussion about the surgical management.  I also have a long discussion with the patient about the life with a colostomy.  We discussed about the possibility of reversal in the future hoping that this is not a permanent colostomy.  After additional discussion the patient understood and agreed to proceed.  I discussed with the patient the risks that include infection, bleeding, intra-abdominal abscess, injury to adjacent organs such as intestine, bladder, kidneys, liver among others.  Patient currently on antibiotic therapy.  Her white blood cell count, hemoglobin and renal function is adequate.  There is no electrolyte disturbance.  We will coordinate surgery for tomorrow for partial colectomy and creation of end  colostomy.  Arnold Long, MD

## 2020-03-07 ENCOUNTER — Encounter
Admission: RE | Disposition: A | Discharge status: Home / Self Care | Payer: Self-pay | Source: Home / Self Care | Attending: Internal Medicine

## 2020-03-07 ENCOUNTER — Encounter: Payer: Self-pay | Admitting: Internal Medicine

## 2020-03-07 ENCOUNTER — Inpatient Hospital Stay: Payer: Medicare Other | Admitting: Anesthesiology

## 2020-03-07 HISTORY — PX: PARTIAL COLECTOMY: SHX5273

## 2020-03-07 HISTORY — PX: COLOSTOMY: SHX63

## 2020-03-07 LAB — RENAL FUNCTION PANEL
Albumin: 2.2 g/dL — ABNORMAL LOW (ref 3.5–5.0)
Anion gap: 7 (ref 5–15)
BUN: 23 mg/dL (ref 8–23)
CO2: 26 mmol/L (ref 22–32)
Calcium: 8.7 mg/dL — ABNORMAL LOW (ref 8.9–10.3)
Chloride: 104 mmol/L (ref 98–111)
Creatinine, Ser: 1.34 mg/dL — ABNORMAL HIGH (ref 0.44–1.00)
GFR, Estimated: 40 mL/min — ABNORMAL LOW (ref >60)
Glucose, Bld: 213 mg/dL — ABNORMAL HIGH (ref 70–99)
Phosphorus: 3.2 mg/dL (ref 2.5–4.6)
Potassium: 4.3 mmol/L (ref 3.5–5.1)
Sodium: 137 mmol/L (ref 135–145)

## 2020-03-07 LAB — GLUCOSE, CAPILLARY
Glucose-Capillary: 206 mg/dL — ABNORMAL HIGH (ref 70–99)
Glucose-Capillary: 222 mg/dL — ABNORMAL HIGH (ref 70–99)
Glucose-Capillary: 238 mg/dL — ABNORMAL HIGH (ref 70–99)
Glucose-Capillary: 221 mg/dL — ABNORMAL HIGH (ref 70–99)

## 2020-03-07 LAB — MAGNESIUM: Magnesium: 1.8 mg/dL (ref 1.7–2.4)

## 2020-03-07 SURGERY — COLECTOMY, PARTIAL
Anesthesia: General

## 2020-03-07 MED ORDER — SODIUM CHLORIDE 0.9 % IV SOLN
INTRAVENOUS | Status: DC | PRN
  Administered 2020-03-07: 250 mL via INTRAVENOUS
  Administered 2020-03-08 – 2020-03-09 (×2): 1000 mL via INTRAVENOUS

## 2020-03-07 MED ORDER — CHLORHEXIDINE GLUCONATE 0.12 % MT SOLN
OROMUCOSAL | Status: AC
  Administered 2020-03-07: 15 mL via OROMUCOSAL
  Filled 2020-03-07: qty 15

## 2020-03-07 MED ORDER — BUPIVACAINE-EPINEPHRINE (PF) 0.5% -1:200000 IJ SOLN
INTRAMUSCULAR | Status: AC
  Filled 2020-03-07: qty 30

## 2020-03-07 MED ORDER — SODIUM CHLORIDE (PF) 0.9 % IJ SOLN
INTRAMUSCULAR | Status: AC
  Filled 2020-03-07: qty 50

## 2020-03-07 MED ORDER — ONDANSETRON HCL 4 MG/2ML IJ SOLN
INTRAMUSCULAR | Status: DC | PRN
  Administered 2020-03-07: 4 mg via INTRAVENOUS

## 2020-03-07 MED ORDER — BUPIVACAINE LIPOSOME 1.3 % IJ SUSP
INTRAMUSCULAR | Status: DC | PRN
  Administered 2020-03-07: 20 mL

## 2020-03-07 MED ORDER — DEXAMETHASONE SODIUM PHOSPHATE 10 MG/ML IJ SOLN
INTRAMUSCULAR | Status: DC | PRN
  Administered 2020-03-07: 5 mg via INTRAVENOUS

## 2020-03-07 MED ORDER — SUCCINYLCHOLINE CHLORIDE 200 MG/10ML IV SOSY
PREFILLED_SYRINGE | INTRAVENOUS | Status: AC
  Filled 2020-03-07: qty 10

## 2020-03-07 MED ORDER — ROCURONIUM BROMIDE 100 MG/10ML IV SOLN
INTRAVENOUS | Status: DC | PRN
  Administered 2020-03-07: 20 mg via INTRAVENOUS
  Administered 2020-03-07: 30 mg via INTRAVENOUS
  Administered 2020-03-07: 10 mg via INTRAVENOUS

## 2020-03-07 MED ORDER — ONDANSETRON HCL 4 MG/2ML IJ SOLN
4.0000 mg | INTRAMUSCULAR | Status: DC | PRN
  Administered 2020-03-08 – 2020-03-17 (×26): 4 mg via INTRAVENOUS
  Filled 2020-03-07 (×28): qty 2

## 2020-03-07 MED ORDER — LACTATED RINGERS IV SOLN: INTRAVENOUS | Status: DC | PRN

## 2020-03-07 MED ORDER — BUPIVACAINE LIPOSOME 1.3 % IJ SUSP
INTRAMUSCULAR | Status: AC
  Filled 2020-03-07: qty 20

## 2020-03-07 MED ORDER — FENTANYL CITRATE (PF) 100 MCG/2ML IJ SOLN
INTRAMUSCULAR | Status: AC
  Filled 2020-03-07: qty 2

## 2020-03-07 MED ORDER — ACETAMINOPHEN 10 MG/ML IV SOLN
INTRAVENOUS | Status: AC
  Filled 2020-03-07: qty 100

## 2020-03-07 MED ORDER — CHLORHEXIDINE GLUCONATE 0.12 % MT SOLN: 15.0000 mL | Freq: Once | OROMUCOSAL | Status: AC

## 2020-03-07 MED ORDER — ONDANSETRON HCL 4 MG/2ML IJ SOLN
INTRAMUSCULAR | Status: AC
  Filled 2020-03-07: qty 2

## 2020-03-07 MED ORDER — ONDANSETRON HCL 4 MG/2ML IJ SOLN
4.0000 mg | Freq: Once | INTRAMUSCULAR | Status: AC | PRN
  Administered 2020-03-07: 4 mg via INTRAVENOUS

## 2020-03-07 MED ORDER — ONDANSETRON HCL 4 MG/2ML IJ SOLN
INTRAMUSCULAR | Status: AC
  Administered 2020-03-07: 4 mg
  Filled 2020-03-07: qty 2

## 2020-03-07 MED ORDER — HYDROMORPHONE HCL 1 MG/ML IJ SOLN
2.0000 mg | Freq: Once | INTRAMUSCULAR | Status: AC
  Administered 2020-03-07: 2 mg via INTRAVENOUS
  Filled 2020-03-07: qty 2

## 2020-03-07 MED ORDER — BUPIVACAINE HCL (PF) 0.5 % IJ SOLN
INTRAMUSCULAR | Status: AC
  Filled 2020-03-07: qty 30

## 2020-03-07 MED ORDER — TRAVASOL 10 % IV SOLN
INTRAVENOUS | Status: AC
  Filled 2020-03-07: qty 858

## 2020-03-07 MED ORDER — SUCCINYLCHOLINE CHLORIDE 20 MG/ML IJ SOLN
INTRAMUSCULAR | Status: DC | PRN
  Administered 2020-03-07: 100 mg via INTRAVENOUS

## 2020-03-07 MED ORDER — BUPIVACAINE HCL 0.5 % IJ SOLN: INTRAMUSCULAR | Status: DC | PRN

## 2020-03-07 MED ORDER — SUGAMMADEX SODIUM 200 MG/2ML IV SOLN
INTRAVENOUS | Status: DC | PRN
  Administered 2020-03-07: 110 mg via INTRAVENOUS

## 2020-03-07 MED ORDER — PROPOFOL 10 MG/ML IV BOLUS
INTRAVENOUS | Status: DC | PRN
  Administered 2020-03-07: 120 mg via INTRAVENOUS

## 2020-03-07 MED ORDER — BUPIVACAINE-EPINEPHRINE 0.5% -1:200000 IJ SOLN
INTRAMUSCULAR | Status: DC | PRN
Start: 2020-03-07 — End: 2020-03-07
  Administered 2020-03-07: 30 mL

## 2020-03-07 MED ORDER — ONDANSETRON HCL 4 MG PO TABS
4.0000 mg | ORAL_TABLET | ORAL | Status: DC | PRN
  Administered 2020-03-07 – 2020-03-18 (×3): 4 mg via ORAL
  Filled 2020-03-07 (×3): qty 1

## 2020-03-07 MED ORDER — PHENYLEPHRINE HCL (PRESSORS) 10 MG/ML IV SOLN
INTRAVENOUS | Status: DC | PRN
  Administered 2020-03-07 (×3): 100 ug via INTRAVENOUS
  Administered 2020-03-07: 200 ug via INTRAVENOUS

## 2020-03-07 MED ORDER — FENTANYL CITRATE (PF) 100 MCG/2ML IJ SOLN: 25.0000 ug | INTRAMUSCULAR | Status: DC | PRN

## 2020-03-07 MED ORDER — ACETAMINOPHEN 10 MG/ML IV SOLN
INTRAVENOUS | Status: DC | PRN
  Administered 2020-03-07: 1000 mg via INTRAVENOUS

## 2020-03-07 MED ORDER — DEXAMETHASONE SODIUM PHOSPHATE 10 MG/ML IJ SOLN
INTRAMUSCULAR | Status: AC
  Filled 2020-03-07: qty 1

## 2020-03-07 MED ORDER — ROCURONIUM BROMIDE 10 MG/ML (PF) SYRINGE
PREFILLED_SYRINGE | INTRAVENOUS | Status: AC
  Filled 2020-03-07: qty 10

## 2020-03-07 MED ORDER — FENTANYL CITRATE (PF) 100 MCG/2ML IJ SOLN
INTRAMUSCULAR | Status: DC | PRN
  Administered 2020-03-07 (×2): 50 ug via INTRAVENOUS

## 2020-03-07 SURGICAL SUPPLY — 50 items
"PENCIL ELECTRO HAND CTR " (MISCELLANEOUS) ×1 IMPLANT
APL PRP STRL LF DISP 70% ISPRP (MISCELLANEOUS) ×1
BLADE SURG 15 STRL LF DISP TIS (BLADE) ×1 IMPLANT
BLADE SURG 15 STRL SS (BLADE) ×2
BULB RESERV EVAC DRAIN JP 100C (MISCELLANEOUS) ×1 IMPLANT
CHLORAPREP W/TINT 26 (MISCELLANEOUS) ×2 IMPLANT
CLIP VESOCCLUDE MED 6/CT (CLIP) IMPLANT
COVER WAND RF STERILE (DRAPES) ×2 IMPLANT
DRAIN CHANNEL JP 19F (MISCELLANEOUS) ×1 IMPLANT
DRAPE LAPAROTOMY 100X77 ABD (DRAPES) ×2 IMPLANT
DRSG AQUACEL AG ADV 3.5X10 (GAUZE/BANDAGES/DRESSINGS) ×1 IMPLANT
ELECT BLADE 6 FLAT ULTRCLN (ELECTRODE) ×2 IMPLANT
ELECT REM PT RETURN 9FT ADLT (ELECTROSURGICAL) ×2
ELECTRODE REM PT RTRN 9FT ADLT (ELECTROSURGICAL) ×1 IMPLANT
GLOVE BIO SURGEON STRL SZ 6.5 (GLOVE) ×6 IMPLANT
GLOVE BIOGEL PI IND STRL 6.5 (GLOVE) ×2 IMPLANT
GLOVE BIOGEL PI INDICATOR 6.5 (GLOVE) ×2
GOWN STRL REUS W/ TWL LRG LVL3 (GOWN DISPOSABLE) ×6 IMPLANT
GOWN STRL REUS W/TWL LRG LVL3 (GOWN DISPOSABLE) ×12
KIT TURNOVER KIT A (KITS) ×2 IMPLANT
LABEL OR SOLS (LABEL) ×2 IMPLANT
LIGASURE IMPACT 36 18CM CVD LR (INSTRUMENTS) ×3 IMPLANT
MANIFOLD NEPTUNE II (INSTRUMENTS) ×2 IMPLANT
NEEDLE HYPO 22GX1.5 SAFETY (NEEDLE) ×4 IMPLANT
NS IRRIG 1000ML POUR BTL (IV SOLUTION) ×7 IMPLANT
NS IRRIG 500ML POUR BTL (IV SOLUTION) ×1 IMPLANT
PACK BASIN MAJOR ARMC (MISCELLANEOUS) ×2 IMPLANT
PACK BASIN MINOR ARMC (MISCELLANEOUS) ×2 IMPLANT
PACK COLON CLEAN CLOSURE (MISCELLANEOUS) ×2 IMPLANT
PENCIL ELECTRO HAND CTR (MISCELLANEOUS) ×2 IMPLANT
SCALPEL PROTECTED #11 DISP (BLADE) ×1 IMPLANT
SET YANKAUER POOLE SUCT (MISCELLANEOUS) ×1 IMPLANT
SOL PREP PVP 2OZ (MISCELLANEOUS)
SOLUTION PREP PVP 2OZ (MISCELLANEOUS) ×1 IMPLANT
SPONGE LAP 18X18 RF (DISPOSABLE) ×2 IMPLANT
STAPLER CUT CVD 40MM BLUE (STAPLE) ×1 IMPLANT
STAPLER PROXIMATE 75MM BLUE (STAPLE) ×1 IMPLANT
STAPLER SKIN PROX 35W (STAPLE) ×2 IMPLANT
SUT ETHILON 3 0 FSLX (SUTURE) ×1 IMPLANT
SUT PDS AB 0 CT1 27 (SUTURE) ×4 IMPLANT
SUT PDS AB 1 TP1 54 (SUTURE) ×4 IMPLANT
SUT PROLENE 0 CT 1 30 (SUTURE) ×2 IMPLANT
SUT SILK 3-0 (SUTURE) ×2 IMPLANT
SUT STRATAFIX PDS 30 CT-1 (SUTURE) ×2 IMPLANT
SUT VIC AB 3-0 SH 27 (SUTURE) ×4
SUT VIC AB 3-0 SH 27X BRD (SUTURE) ×2 IMPLANT
SUT VIC AB 4-0 FS2 27 (SUTURE) ×2 IMPLANT
SWABSTK COMLB BENZOIN TINCTURE (MISCELLANEOUS) ×2 IMPLANT
SYR 20ML LL LF (SYRINGE) ×4 IMPLANT
TRAY FOLEY SLVR 16FR LF STAT (SET/KITS/TRAYS/PACK) ×1 IMPLANT

## 2020-03-07 NOTE — Consult Note (Signed)
[facility] Nurse requested for preoperative stoma site marking  Discussed surgical procedure and stoma creation with patient and family.  Explained role of the [facility] nurse team.  Provided the patient with educational booklet and provided samples of pouching options.  Answered patient and family questions.   Examined patient lying, sitting  in order to place the marking in the patient's visual field, away from any creases or abdominal contour issues and within the rectus muscle. Patient's abdomen is extremely distended, marked higher for that reason  Marked for colostomy in the LLQ  _3cm to the left of the umbilicus and __1__OX above the umbilicus.   Patient's abdomen cleansed with CHG wipes at site markings, allowed to air dry prior to marking.Covered mark with thin film transparent dressing to preserve mark until date of surgery.   [facility] Nurse team will follow up with patient after surgery for continue ostomy care and teaching.  Millard MSN, [place], [place], [place]

## 2020-03-07 NOTE — Anesthesia Preprocedure Evaluation (Addendum)
Anesthesia Evaluation  Patient identified by MRN, date of birth, ID band Patient awake    Reviewed: Allergy & Precautions, H&P , NPO status , Patient's Chart, lab work & pertinent test results, reviewed documented beta blocker date and time   History of Anesthesia Complications Negative for: history of anesthetic complications  Airway Mallampati: II  TM Distance: <3 FB Neck ROM: limited    Dental  (+) Poor Dentition, Chipped, Missing   Pulmonary shortness of breath and with exertion, COPD, former smoker,    Pulmonary exam normal        Cardiovascular hypertension, On Medications (-) angina(-) Past MI negative cardio ROS Normal cardiovascular exam     Neuro/Psych Anxiety  Neuromuscular disease negative psych ROS   GI/Hepatic Neg liver ROS, GERD  Medicated,  Endo/Other  Hypothyroidism   Renal/GU Renal disease  negative genitourinary   Musculoskeletal   Abdominal   Peds  Hematology negative hematology ROS (+) Blood dyscrasia, anemia ,   Anesthesia Other Findings Past Medical History: No date: Anxiety     Comment:  Related to HTN and vision loss No date: Central retinal vein occlusion of left eye     Comment:  legally blind in Left eye No date: Chronic kidney disease     Comment:  with allergic interstitial nephritis on biopsy 2012 No date: Collagen vascular disease ([place])     Comment:  uncharacterized.  has seen rheum.  positive RF, positive              serologies for Sjogrens.  C-ANCA/P-ANCA neg, ANA  neg,               anti-dsDNA neg, anti-SCL70 neg, anti-centromere Ab neg No date: COPD (chronic obstructive pulmonary disease) (HCC)     Comment:  quit smoking 1993 No date: Diverticulosis No date: Dysfunction of eustachian tube No date: Fatigue No date: GERD (gastroesophageal reflux disease) No date: Goiter, unspecified No date: HLD (hyperlipidemia) No date: HTN (hypertension)     Comment:  difficult to  control, hypertensive urgency               hospitalizations, normal urine/plasma metanephrines and               catecholamines, renal dopplers without stenosis No date: Hyperaldosteronism     Comment:  serum aldo/renin ration elevated (aldo 23.7, renin               <0.15), CT abd 2010 with normal adrenals, dedicated               adrenal CT without adrenal adenoma,  No date: Hypothyroid No date: Insomnia     Comment:  Related to HTN and vision loss No date: Irritable bowel syndrome 09/22/2014: Leiomyosarcoma of uterus ([place]) No date: Lung cancer [facility])     Comment:  s/p R lobectomy 2001 No date: Lupus ([place]) No date: Osteopenia No date: Pulmonary hypertension ([place])     Comment:  echo [facility] 12/2008 with EF >5%, grade II diastolic               dysfunction, RSVP >89mmHg, ACE level normal.  f/u echo at              [place] - EF 55-60%, mild MR, mild LAE, PASP 25 + RA (no               pulm HTN) 2014: Thyroid cancer ([place]) No date: Urge incontinence No date: Uterine cancer ([place])     Comment:  s/p hysterectomy 2001 Past Surgical History: 06/1999: ABDOMINAL HYSTERECTOMY 1958: APPENDECTOMY 1982: CHOLECYSTECTOMY 2010: CT ABD W & PELVIS WO CM     Comment:  neg except diverticulosis 2007: CT chest     Comment:  enlarged thyroid with goiter 04/1999: LOBECTOMY; Right     Comment:  right lung, done in [place] 2003: lung nodule excision; Left     Comment:  benign 2014: THYROIDECTOMY 1975: TUBAL LIGATION   Reproductive/Obstetrics negative OB ROS                            Anesthesia Physical  Anesthesia Plan  ASA: IV  Anesthesia Plan: General ETT   Post-op Pain Management:    Induction: Intravenous  PONV Risk Score and Plan: Ondansetron, Dexamethasone, Midazolam and Treatment may vary due to age or medical condition  Airway Management Planned: Oral ETT  Additional Equipment:   Intra-op Plan:   Post-operative Plan: Extubation in OR and Possible Post-op  intubation/ventilation  Informed Consent: I have reviewed the patients History and Physical, chart, labs and discussed the procedure including the risks, benefits and alternatives for the proposed anesthesia with the patient or authorized representative who has indicated his/her understanding and acceptance.   Patient has DNR.  Discussed DNR with patient, Suspend DNR and Discussed DNR with power of attorney.   Dental Advisory Given  Plan Discussed with: CRNA  Anesthesia Plan Comments: (Patient and daughter consented for risks of anesthesia including but not limited to:  - adverse reactions to medications - damage to eyes, teeth, lips or other oral mucosa - nerve damage due to positioning  - sore throat or hoarseness - Damage to heart, brain, nerves, lungs, other parts of body or loss of life  They voiced understanding.)       Anesthesia Quick Evaluation

## 2020-03-07 NOTE — Op Note (Signed)
Preoperative diagnosis: Diverticular disease with abscess and perforation.  Postoperative diagnosis: Diverticular disease with abscess and perforation.                                              Liver lesion  Procedure: Sigmoid colon resection with colostomy (Hartmann's procedure).                        Drainage of intra abdominal abscess                      Liver biopsy  Anesthesia: GETA  Surgeon: Dr. Windell Moment, MD  Wound Classification: Dirty  Indications:  Patient is a 79 y.o. female with abdominal pain and obstructive symptoms due recurrent bouts of diverticulitis was found to have an acute perforation with abscess of the sigmoid colon. Sigmoid resection was indicated.  Description of procedure:  The patient was placed in the supine position and general endotracheal anesthesia was induced. A time-out was completed verifying correct patient, procedure, site, positioning, and implant(s) and/or special equipment prior to beginning this procedure. Preoperative antibiotics were given. A Foley catheter and nasogastric tube were placed. The abdomen was prepped and draped in the usual sterile fashion. A vertical midline incision was made from xiphoid to just above the pubis. This was deepened through the subcutaneous tissues and hemostasis was achieved with electrocautery. The linea alba was identified and incised and the peritoneal cavity entered. The abdomen was explored. Abundant amount of adhesions were identified. Time consuming lysis of adhesions were done sharply under direct vision with Metzenbaum scissors. Upon entering the abdomen, I encountered an abscess in the mid mesentery.  The small bowel was inspected and retracted to the right using a moist towel and self-retaining retractor. Using electrocautery, the colon was freed from its peritoneal attachments along the line of Toldt proximally from the splenic flexure and distally to the pelvic inlet. Both ureters were identified and  protected.  Points of transection were selected proximally and distally. The bowel was divided with the linear cutting stapler. The peritoneum overlying the mesentery was then scored with electrocautery and the left colic artery was identified, double ligated with 2-0 silk sutures, and transected. The peritoneum overlying the mesentery was scored and remaining mesentery divided with LigaSure.  The specimen was removed, and sent to pathology. The abdominal cavity was then copiously irrigated and hemostasis was checked.  A small liver lesion was identified in the left liver. With Blade, the lesion was resected in a wedge fashion. Hemostasis of the liver achieved with electrocautery.  The proximal colon reached easily to the proposed colostomy site without tension. A disk of skin was removed from the colostomy site in the left lower quadrant. The incision was deepened through all layers of the abdominal wall and dilated to admit two fingers. The colon was passed out through the ostomy site without torsion or tension.    The Hartmann's pouch was tagged with two long sutures of 2-0 prolene and allowed to fall into the pelvis.  A closed suction drain was placed in the pelvis and brought out through separate stab wounds lateral to the incision. These were secured with 3-0 nylon.  Using a clean closure technique, the fascia was closed with a running suture of 0 Stratafix. The skin was closed with skin staples.  The colostomy was  matured with multiple interrupted sutures of 3-0 Vicryl. An ostomy bag was applied.  The patient tolerated the procedure well and was taken to the postanesthesia care unit in stable condition.   Specimen: Sigmoid colon                     Liver biopsy  Complications: None  EBL: 250 mL

## 2020-03-07 NOTE — Progress Notes (Signed)
Inpatient Diabetes Program Recommendations  AACE/ADA: New Consensus Statement on Inpatient Glycemic Control  Target Ranges:  Prepandial:   less than 140 mg/dL      Peak postprandial:   less than 180 mg/dL (1-2 hours)      Critically ill patients:  140 - 180 mg/dL  Results for Margaret Black, Margaret Black (MRN [id number]) as of 03/07/2020 07:34  Ref. Range 03/06/2020 00:48 03/06/2020 12:29 03/06/2020 18:05 03/06/2020 23:44 03/07/2020 05:20  Glucose-Capillary Latest Ref Range: 70 - 99 mg/dL 221 (H) 216 (H) 223 (H) 216 (H) 206 (H)    Review of Glycemic Control  Current orders for Inpatient glycemic control: Novolog 0-9 units Q6H; TPN @ 40 ml/hr with 10 units of Regular insulin  Inpatient Diabetes Program Recommendations:    Insulin: Please consider increasing insulin in TPN to 25 units.  Thanks, Barnie Alderman, RN, MSN, CDE Diabetes Coordinator Inpatient Diabetes Program [phone] (Team Pager from 8am to 5pm)

## 2020-03-07 NOTE — Interval H&P Note (Signed)
History and Physical Interval Note:  03/07/2020 4:04 PM  Margaret Black  has presented today for surgery, with the diagnosis of acute divericulitis and abcess.  The various methods of treatment have been discussed with the patient and family. After consideration of risks, benefits and other options for treatment, the patient has consented to  Procedure(s): PARTIAL COLECTOMY (N/A) COLOSTOMY (N/A) as a surgical intervention.  The patient's history has been reviewed, patient examined, no change in status, stable for surgery.  I have reviewed the patient's chart and labs.  Questions were answered to the patient's satisfaction.     Herbert Pun

## 2020-03-07 NOTE — Progress Notes (Signed)
[facility] [place] NAME: Margaret Black    MR#:  [id number]  DATE OF BIRTH:  June 25, 1940  SUBJECTIVE:  " I want to get over with this" has some nausea dter in the room No vomiting or fever  ON TPN with Insulin NPO for surgery--pt now agreeable   REVIEW OF SYSTEMS:   Review of Systems  Constitutional: Negative for chills, fever and weight loss.  HENT: Negative for ear discharge, ear pain and nosebleeds.   Eyes: Negative for blurred vision, pain and discharge.  Respiratory: Negative for sputum production, shortness of breath, wheezing and stridor.   Cardiovascular: Negative for chest pain, palpitations, orthopnea and PND.  Gastrointestinal: Positive for nausea. Negative for abdominal pain, diarrhea and vomiting.  Genitourinary: Negative for frequency and urgency.  Musculoskeletal: Negative for back pain and joint pain.  Neurological: Negative for sensory change, speech change, focal weakness and weakness.  Psychiatric/Behavioral: Negative for depression and hallucinations. The patient is not nervous/anxious.    Tolerating Diet:npo Tolerating PT: pending  DRUG ALLERGIES:   Allergies  Allergen Reactions  . Lactase Diarrhea  . Atenolol Rash  . Latex Rash    VITALS:  Blood pressure (!) 125/52, pulse 85, temperature 98 F (36.7 C), temperature source Oral, resp. rate 18, height 5\' 5"  (1.651 m), weight 57.5 kg, SpO2 96 %.  PHYSICAL EXAMINATION:   Physical Exam  GENERAL:  79 y.o.-year-old patient lying in the bed with no acute distress.  HEENT: Head atraumatic, normocephalic. Oropharynx and nasopharynx clear.  NECK:  Supple, no jugular venous distention. No thyroid enlargement, no tenderness.  LUNGS: Normal breath sounds bilaterally, no wheezing, rales, rhonchi. No use of accessory muscles of respiration.  CARDIOVASCULAR: S1, S2 normal. No murmurs, rubs, or gallops.  ABDOMEN: Soft, nontender, nondistended. Bowel sounds present. No  organomegaly or mass.  EXTREMITIES: No cyanosis, clubbing or edema b/l.   PICC+ NEUROLOGIC: Cranial nerves II through XII are intact. No focal Motor or sensory deficits b/l.   PSYCHIATRIC:  patient is alert and oriented x 3.  SKIN: No obvious rash, lesion, or ulcer.   LABORATORY PANEL:  CBC Recent Labs  Lab 03/05/20 0543  WBC 10.3  HGB 10.1*  HCT 28.4*  PLT 277    Chemistries  Recent Labs  Lab 03/05/20 0543 03/06/20 0527 03/07/20 0415  NA 134*   < > 137  K 3.2*   < > 4.3  CL 101   < > 104  CO2 24   < > 26  GLUCOSE 195*   < > 213*  BUN 16   < > 23  CREATININE 1.44*   < > 1.34*  CALCIUM 8.4*   < > 8.7*  MG 1.9   < > 1.8  AST 16  --   --   ALT 19  --   --   ALKPHOS 194*  --   --   BILITOT 1.0  --   --    < > = values in this interval not displayed.   Cardiac Enzymes No results for input(s): TROPONINI in the last 168 hours. RADIOLOGY:  CT ABDOMEN PELVIS W CONTRAST  Result Date: 03/06/2020 CLINICAL DATA:  Acute recurrent diverticulitis. Diverticulitis, complication suspected. EXAM: CT ABDOMEN AND PELVIS WITH CONTRAST TECHNIQUE: Multidetector CT imaging of the abdomen and pelvis was performed using the standard protocol following bolus administration of intravenous contrast. CONTRAST:  72mL OMNIPAQUE IOHEXOL 300 MG/ML  SOLN COMPARISON:  CT of the abdomen and pelvis  03/01/2020 and 01/07/2020 FINDINGS: Lower chest: Bilateral pleural effusions are now present, right greater than left. Mild dependent atelectasis present bilaterally. Centrilobular emphysematous changes are again noted. Heart size is normal. No significant pericardial effusion is present. Artery calcifications are again noted. Hepatobiliary: Progressive intra and extrahepatic biliary dilation is noted. Common bile duct measures up to 12.5 mm. Obstructing lesion is present. Patient is status post cholecystectomy. Simple cyst in the periphery of the right lobe of the liver measures 10 mm. Pancreas: Mild pancreatic duct  dilation is stable. No discrete lesion is present. Mass lesion is present. Spleen: Normal in size without focal abnormality. Adrenals/Urinary Tract: Adrenal glands are normal bilaterally. Kidneys and ureters are within normal limits. No obstruction or stone is present. No mass lesion is present. The urinary bladder is within normal limits. Stomach/Bowel: Stomach is mildly distended. Density in the duodenum may represent tablet. Proximal small bowel is dilated. Fluid levels are present. Loops of small bowel measure up to 4.2 cm. Point of obstruction is centered in the pelvis associated with an extraluminal fluid collection. More distal small bowel is collapsed. The ascending and transverse colon are within normal limits. Diverticular changes are present in the descending colon. More prominent diverticular changes are present in the sigmoid colon is previously seen with inflammatory changes, now associated with the extraluminal collection. Vascular/Lymphatic: Atherosclerotic calcifications are present in the aorta and branch vessels without aneurysm. Reproductive: Prostate is unremarkable. Other: Extraluminal loculated fluid collection with air-fluid levels adjacent to the inflamed sigmoid colon measures 2.9 x 6.8 x 3.5 cm maximally. The collection is not accessible via a percutaneous route due to overlying bowel. Musculoskeletal: Vertebral body heights and alignment are normal. Bony pelvis is within limits. Hips are located and within normal limits. IMPRESSION: 1. Extraluminal loculated fluid collection with air-fluid levels adjacent to the inflamed sigmoid colon measures 2.9 x 6.8 x 3.5 cm maximally. The collection is not accessible via a percutaneous route due to overlying bowel. This is consistent with a developing abscess. 2. Small bowel obstruction with transition point centered in the pelvis associated with the extraluminal fluid collection. 3. Progressive intra and extrahepatic biliary dilation without an  obstructing lesion. 4. Bilateral pleural effusions, right greater than left. 5. Aortic Atherosclerosis (ICD10-I70.0) and Emphysema (ICD10-J43.9). Electronically Signed   By: San Morelle M.D.   On: 03/06/2020 13:56   ASSESSMENT AND PLAN:   Margaret Black is a 79 y.o. female with medical history significant for significant forGERD, COPD, lung cancer status post evaluation for radiation therapy, hypothyroidism, SLE and stage IV chronic kidney disease who presented to the hospital for an outpatient upper and lower endoscopy after evaluation for unintentional weight loss and poor appetite.  Admission was requested because patient complained of worsening lower abdominal pain for 2 days.   Acute recurrent diverticulitis   --CT scan repeated on 03/06/20--  showing developing abscess and small bowel obstruction.   -Case discussed with radiologist and IR does not believe they have a window to do a drainage.   -The patient currently on IV Zosyn and has previous failed outpatient therapy. -Patient has a PICC line and TPN for nutrition.   -patient was seen by Dr. Windell Moment. Scheduled for colectomy and colostomy today. Patient is now in agreement with surgery.  Acute kidney injury on chronic kidney disease stage IIIb.  -- Creatinine 2.07 on presentation.  Creatinine 1.43--1.3  -- Continue IV fluid hydration along with TPN   Essential hypertension/ hyperaldosteronism  -on Bystolic, nifedipine and low-dose Aldactone  Hypothyroidism unspecified on Synthroid  GERD on Protonix  Anemia of chronic disease - hgb stable  Severe protein calorie malnutrition and weight loss.   -On TPN  Hyperglycemia suspected due to TPN -- continue IV insulin via TPN. Appreciate diabetes coordinator input.   Procedures: Family communication :dter in the room Consults :GI, surgery CODE STATUS: DNR DVT Prophylaxis :on hold for surgery  Status is: Inpatient  Remains inpatient appropriate because:Inpatient  level of care appropriate due to severity of illness   Dispo: The patient is from: home              Anticipated d/c is to: Home              Anticipated d/c date is: 2 days              Patient currently is not medically stable to d/c.        TOTAL TIME TAKING CARE OF THIS PATIENT: 25 minutes.  >50% time spent on counselling and coordination of care  Note: This dictation was prepared with Dragon dictation along with smaller phrase technology. Any transcriptional errors that result from this process are unintentional.  Fritzi Mandes M.D    [facility]   CC: Primary care physician; Olin Hauser, DOPatient ID: [name], female   DOB: 09-19-1940, 79 y.o.   MRN: [id number]

## 2020-03-07 NOTE — Anesthesia Postprocedure Evaluation (Signed)
Anesthesia Post Note  Patient: DAYLEEN BESKE  Procedure(s) Performed: PARTIAL COLECTOMY (N/A ) COLOSTOMY (N/A )  Patient location during evaluation: PACU Anesthesia Type: General Level of consciousness: awake and alert Pain management: pain level controlled Vital Signs Assessment: post-procedure vital signs reviewed and stable Respiratory status: spontaneous breathing, nonlabored ventilation, respiratory function stable and patient connected to nasal cannula oxygen Cardiovascular status: blood pressure returned to baseline and stable Postop Assessment: no apparent nausea or vomiting Anesthetic complications: no   No complications documented.   Last Vitals:  Vitals:   03/07/20 2100 03/07/20 2113  BP: (!) 144/57 (!) 143/63  Pulse: 90 88  Resp: (!) 37 (!) 25  Temp:  (!) 36.3 C  SpO2: 100% 100%    Last Pain:  Vitals:   03/07/20 2113  TempSrc:   PainSc: 0-No pain                 Precious Haws Padraig Nhan

## 2020-03-07 NOTE — Progress Notes (Signed)
PHARMACY - TOTAL PARENTERAL NUTRITION CONSULT NOTE   Indication: severe diverticulitis  Patient Measurements: Height: 5\' 5"  (165.1 cm) Weight: 57.5 kg (126 lb 12.2 oz) IBW/kg (Calculated) : 57 TPN AdjBW (KG): 52.2 Body mass index is 21.09 kg/m.   Assessment:  recurrent diverticulitis,  per surgery note: this will be her third trial of antibiotics, second hospitalization. typical recommendation is to proceed with surgical resection of the colon.  Surgery of this type of thing resulted in a temporary colostomy due to the extensive inflammation making primary anastomosis higher risk.  Scheduled for colectomy and colostomy today. Patient is now in agreement with surgery.  Glucose / Insulin: BG 213 - 223  SSI  SSI/24hr= 12 units required Electrolytes: WNL Renal: Scr 2.07 -->1.34 LFTs / TGs: LFTs WNL   TG 123-->150 Prealbumin / albumin:   Alb 2.2  preAlb 6.4 Intake / Output; MIVF: intake not recorded 0.9% NaCl at 50 mL/hr GI Imaging: no recent Surgeries / Procedures: none this visit  Central access: PICC 03/03/20 TPN start date: 03/03/20  Nutritional Goals (per RD recommendation on 03/05/20): kCal: 1565-1830, Protein: 80-95 g, Fluid: >/= 1.3 L   Current Nutrition:  NPO   Plan: 1 gram IV mag  continue TPN rate at 65 ml/hr (total volume 1660 mL)  Nutritional Components: ? Protein: 55 G/L- Clinisol 10% (85.8 g/day)  ? Dextrose: 21 % (327.6 g/day) ? Lipids: 18 g/L (28 g/day)   ? kCal: 1737/day  Electrolytes in TPN (standard): 74mEq/L of Na, 35mEq/L of K, 25mEq/L of Ca, 97mEq/L of Mg, and 19mmol/L of Phos. Cl:Ac ratio 1:1  Add 25 units regular insulin to TPN  Add standard MVI and trace elements to TPN daily  Continue sensitive SSI q6h SSI and adjust as needed   MIVF: 0.9% NaCl at 50 mL/hr  Monitor TPN labs on Mon/Thurs, daily until stable   [name] 03/07/2020,7:06 AM

## 2020-03-07 NOTE — Anesthesia Procedure Notes (Signed)
Procedure Name: Intubation Date/Time: 03/07/2020 4:51 PM Performed by: Johnna Acosta, CRNA Pre-anesthesia Checklist: Patient identified, Emergency Drugs available, Patient being monitored and Timeout performed Patient Re-evaluated:Patient Re-evaluated prior to induction Oxygen Delivery Method: Circle system utilized Preoxygenation: Pre-oxygenation with 100% oxygen Induction Type: IV induction, Rapid sequence and Cricoid Pressure applied Laryngoscope Size: McGraph and 3 Grade View: Grade I Tube type: Oral Tube size: 6.5 mm Number of attempts: 1 Airway Equipment and Method: Stylet and Oral airway Placement Confirmation: ETT inserted through vocal cords under direct vision,  positive ETCO2 and breath sounds checked- equal and bilateral Secured at: 21 cm Tube secured with: Tape Dental Injury: Teeth and Oropharynx as per pre-operative assessment  Difficulty Due To: Difficulty was anticipated and Difficult Airway- due to limited oral opening

## 2020-03-07 NOTE — Transfer of Care (Signed)
Immediate Anesthesia Transfer of Care Note  Patient: Margaret Black  Procedure(s) Performed: PARTIAL COLECTOMY (N/A ) COLOSTOMY (N/A )  Patient Location: PACU  Anesthesia Type:General  Level of Consciousness: awake  Airway & Oxygen Therapy: Patient connected to face mask oxygen  Post-op Assessment: Post -op Vital signs reviewed and stable  Post vital signs: stable  Last Vitals:  Vitals Value Taken Time  BP 151/60 03/07/20 2043  Temp 36.3 C 03/07/20 2043  Pulse 90 03/07/20 2051  Resp 20 03/07/20 2051  SpO2 100 % 03/07/20 2051  Vitals shown include unvalidated device data.  Last Pain:  Vitals:   03/07/20 2043  TempSrc:   PainSc: 0-No pain      Patients Stated Pain Goal: 2 (79/03/83 3383)  Complications: No complications documented.

## 2020-03-08 ENCOUNTER — Encounter: Payer: Self-pay | Admitting: General Surgery

## 2020-03-08 LAB — CBC
HCT: 30.5 % — ABNORMAL LOW (ref 36.0–46.0)
Hemoglobin: 9.7 g/dL — ABNORMAL LOW (ref 12.0–15.0)
MCH: 33.1 pg (ref 26.0–34.0)
MCHC: 31.8 g/dL (ref 30.0–36.0)
MCV: 104.1 fL — ABNORMAL HIGH (ref 80.0–100.0)
Platelets: 414 10*3/uL — ABNORMAL HIGH (ref 150–400)
RBC: 2.93 MIL/uL — ABNORMAL LOW (ref 3.87–5.11)
RDW: 15.9 % — ABNORMAL HIGH (ref 11.5–15.5)
WBC: 15.4 10*3/uL — ABNORMAL HIGH (ref 4.0–10.5)
nRBC: 0 % (ref 0.0–0.2)

## 2020-03-08 LAB — GLUCOSE, CAPILLARY
Glucose-Capillary: 212 mg/dL — ABNORMAL HIGH (ref 70–99)
Glucose-Capillary: 272 mg/dL — ABNORMAL HIGH (ref 70–99)
Glucose-Capillary: 291 mg/dL — ABNORMAL HIGH (ref 70–99)
Glucose-Capillary: 304 mg/dL — ABNORMAL HIGH (ref 70–99)
Glucose-Capillary: 321 mg/dL — ABNORMAL HIGH (ref 70–99)

## 2020-03-08 LAB — COMPREHENSIVE METABOLIC PANEL
ALT: 29 U/L (ref 0–44)
AST: 27 U/L (ref 15–41)
Albumin: 1.7 g/dL — ABNORMAL LOW (ref 3.5–5.0)
Alkaline Phosphatase: 201 U/L — ABNORMAL HIGH (ref 38–126)
Anion gap: 7 (ref 5–15)
BUN: 34 mg/dL — ABNORMAL HIGH (ref 8–23)
CO2: 22 mmol/L (ref 22–32)
Calcium: 8.2 mg/dL — ABNORMAL LOW (ref 8.9–10.3)
Chloride: 104 mmol/L (ref 98–111)
Creatinine, Ser: 1.59 mg/dL — ABNORMAL HIGH (ref 0.44–1.00)
GFR, Estimated: 33 mL/min — ABNORMAL LOW (ref >60)
Glucose, Bld: 363 mg/dL — ABNORMAL HIGH (ref 70–99)
Potassium: 5.1 mmol/L (ref 3.5–5.1)
Sodium: 133 mmol/L — ABNORMAL LOW (ref 135–145)
Total Bilirubin: 0.8 mg/dL (ref 0.3–1.2)
Total Protein: 4.7 g/dL — ABNORMAL LOW (ref 6.5–8.1)

## 2020-03-08 LAB — MAGNESIUM: Magnesium: 1.7 mg/dL (ref 1.7–2.4)

## 2020-03-08 LAB — PHOSPHORUS: Phosphorus: 3.5 mg/dL (ref 2.5–4.6)

## 2020-03-08 MED ORDER — MAGNESIUM SULFATE 2 GM/50ML IV SOLN
2.0000 g | Freq: Once | INTRAVENOUS | Status: AC
  Administered 2020-03-08: 2 g via INTRAVENOUS
  Filled 2020-03-08: qty 50

## 2020-03-08 MED ORDER — INSULIN ASPART 100 UNIT/ML ~~LOC~~ SOLN
0.0000 [IU] | SUBCUTANEOUS | Status: DC
  Administered 2020-03-08: 5 [IU] via SUBCUTANEOUS
  Administered 2020-03-08 (×2): 8 [IU] via SUBCUTANEOUS
  Administered 2020-03-09: 2 [IU] via SUBCUTANEOUS
  Administered 2020-03-09: 3 [IU] via SUBCUTANEOUS
  Administered 2020-03-09: 2 [IU] via SUBCUTANEOUS
  Administered 2020-03-09: 5 [IU] via SUBCUTANEOUS
  Administered 2020-03-12: 8 [IU] via SUBCUTANEOUS
  Filled 2020-03-08 (×8): qty 1

## 2020-03-08 MED ORDER — ENOXAPARIN SODIUM 30 MG/0.3ML ~~LOC~~ SOLN
30.0000 mg | SUBCUTANEOUS | Status: DC
  Administered 2020-03-10 – 2020-03-15 (×6): 30 mg via SUBCUTANEOUS
  Filled 2020-03-08 (×7): qty 0.3

## 2020-03-08 MED ORDER — TRAVASOL 10 % IV SOLN
INTRAVENOUS | Status: AC
  Filled 2020-03-08: qty 858

## 2020-03-08 NOTE — Consult Note (Addendum)
[place] Nurse ostomy follow up Pt had colostomy surgery performed on 12/15. Discussed plan of care with daughter via phone call and we will plan to meet at 0900 tomorrow for a pouch change demonstration and teaching session. Today is the first post-op day and pt has an NG and is in pain.  Stoma type/location: Stoma is red and viable, slightly above skin level, when visualized through pouch which is intact with good seal.  Output: No stool or flatus Ostomy pouching: 2pc.  Education provided:  Discussed with patient that I will come tomorrow at 0900 to meet with her daughter.  Supplies and educational materials left in the room.  Enrolled patient in [facility] Discharge program: Not yet. Julien Girt MSN, RN, [place], [place], [place]

## 2020-03-08 NOTE — Progress Notes (Signed)
PHARMACY - TOTAL PARENTERAL NUTRITION CONSULT NOTE   Indication: severe diverticulitis  Patient Measurements: Height: 5\' 5"  (165.1 cm) Weight: 57.5 kg (126 lb 12.2 oz) IBW/kg (Calculated) : 57 TPN AdjBW (KG): 52.2 Body mass index is 21.09 kg/m.   Assessment:  recurrent diverticulitis,  per surgery note: this will be her third trial of antibiotics, second hospitalization. typical recommendation is to proceed with surgical resection of the colon.  Surgery of this type of thing resulted in a temporary colostomy due to the extensive inflammation making primary anastomosis higher risk.  Scheduled for colectomy and colostomy today. Patient is now in agreement with surgery.  Glucose / Insulin: BG 221 - 363  SSI (5mg  dexamethasone 12/15) SSI/24hr= 13 units required Electrolytes: hyponatremia Renal: Scr 2.07 -->1.34-->1.59 LFTs / TGs: LFTs WNL   TG 123-->150 Prealbumin / albumin:   Alb 2.2-->1.7  preAlb 6.4-->7.2 Intake / Output; MIVF: 3355/3400  No MIVF GI Imaging: no recent Surgeries / Procedures: none this visit  Central access: PICC 03/03/20 TPN start date: 03/03/20  Nutritional Goals (per RD recommendation on 03/05/20): kCal: 1565-1830, Protein: 80-95 g, Fluid: >/= 1.3 L   Current Nutrition:  NPO   Plan:   continue TPN rate at 65 ml/hr (total volume 1660 mL)  Nutritional Components: ? Protein: 55 G/L- Clinisol 10% (85.8 g/day)  ? Dextrose: 21 % (327.6 g/day) ? Lipids: 18 g/L (28 g/day)   ? kCal: 1737/day  Electrolytes in TPN: 33mEq/L of Na, 0 mEq/L of K, 10mEq/L of Ca, 68mEq/L of Mg, and 33mmol/L of Phos. Cl:Ac ratio 1:1  Sodium increased, potassium removed  Add 40 units regular insulin to TPN  2 grams IV magnesium sulfate x 1  Add standard MVI and trace elements to TPN daily  adjust SSI to moderate scale q4h   Monitor TPN labs on Mon/Thurs, daily until stable   [name] 03/08/2020,7:16 AM

## 2020-03-08 NOTE — Progress Notes (Signed)
[facility] Hospital Day(s): 7.   Post op day(s): 1 Day Post-Op.   Interval History: Patient seen and examined, no acute events or new complaints overnight. Patient reports feeling okay.  She does endorses having pain from the surgery.  She feels distended.  She denies passing gas or any stool from the colostomy bag.  Vital signs in last 24 hours: [min-max] current  Temp:  [97.3 F (36.3 C)-98.6 F (37 C)] 98.6 F (37 C) (12/16 1537) Pulse Rate:  [76-91] 78 (12/16 1537) Resp:  [14-37] 14 (12/16 1537) BP: (132-151)/(52-63) 132/52 (12/16 1537) SpO2:  [97 %-100 %] 97 % (12/16 1537)     Height: 5\' 5"  (165.1 cm) Weight: 57.5 kg BMI (Calculated): 21.09   Physical Exam:  Constitutional: alert, cooperative and no distress  Respiratory: breathing non-labored at rest  Cardiovascular: regular rate and sinus rhythm  Gastrointestinal: soft, non-tender, and non-distended.  Wound is dry and clean.  Colostomy pink and patent.  Labs:  CBC Latest Ref Rng & Units 03/08/2020 03/05/2020 03/04/2020  WBC 4.0 - 10.5 K/uL 15.4(H) 10.3 12.3(H)  Hemoglobin 12.0 - 15.0 g/dL 9.7(L) 10.1(L) 9.4(L)  Hematocrit 36.0 - 46.0 % 30.5(L) 28.4(L) 26.9(L)  Platelets 150 - 400 K/uL 414(H) 277 263   CMP Latest Ref Rng & Units 03/08/2020 03/07/2020 03/06/2020  Glucose 70 - 99 mg/dL 363(H) 213(H) 222(H)  BUN 8 - 23 mg/dL 34(H) 23 23  Creatinine 0.44 - 1.00 mg/dL 1.59(H) 1.34(H) 1.43(H)  Sodium 135 - 145 mmol/L 133(L) 137 137  Potassium 3.5 - 5.1 mmol/L 5.1 4.3 4.3  Chloride 98 - 111 mmol/L 104 104 105  CO2 22 - 32 mmol/L 22 26 24   Calcium 8.9 - 10.3 mg/dL 8.2(L) 8.7(L) 8.6(L)  Total Protein 6.5 - 8.1 g/dL 4.7(L) - -  Total Bilirubin 0.3 - 1.2 mg/dL 0.8 - -  Alkaline Phos 38 - 126 U/L 201(H) - -  AST 15 - 41 U/L 27 - -  ALT 0 - 44 U/L 29 - -    Imaging studies: No new pertinent imaging studies   Assessment/Plan:  79 y.o. female with acute diverticulitis with abscess and perforation 1 Day  Post-Op s/p partial colectomy and end colostomy, complicated by pertinent comorbidities including anxiety disorder, chronic kidney disease, COPD, hypertension, hypothyroidism, irritable bowel syndrome, history of lung cancer, lupus.  Patient without any clinical deterioration.  Patient recovering slowly from surgery.  No setback today.  Increasing white blood cell count and labs are expected after surgery.  Patient with postoperative ileus that is also expected since she was having ileus even before surgery due to the intra-abdominal abscess.  Colostomy pink and patent.  We will continue with pain management.  I encouraged patient to ambulate.  No contraindication for DVT prophylaxis.  We will continue n.p.o. with TPN until we see return of bowel function.  Arnold Long, MD

## 2020-03-08 NOTE — Progress Notes (Signed)
PT Cancellation Note  Patient Details Name: Margaret Black MRN: [id number] DOB: [birth year]   Cancelled Treatment:    Reason Eval/Treat Not Completed: Other (comment). Pt is s/p surgery for colon resection and colostomy placement. Due to change in status, will need new PT orders for further treatment. Will cancel current orders at this time. Please re-consult when medically stable.   Vegas Fritze 03/08/2020, 11:52 AM  Greggory Stallion, PT, DPT [phone]

## 2020-03-08 NOTE — Progress Notes (Signed)
The patient has been walking in the room. No output from colostomy. Foley d/c voiding well.

## 2020-03-08 NOTE — Progress Notes (Signed)
[facility] [place] NAME: Margaret Black    MR#:  [id number]  DATE OF BIRTH:  08-01-1940  SUBJECTIVE:  POD #1--overall stable. Some abdominal pain. Very positive outlook! She is happy this is over ON TPN with Insulin  Foley removed  REVIEW OF SYSTEMS:   Review of Systems  Constitutional: Negative for chills, fever and weight loss.  HENT: Negative for ear discharge, ear pain and nosebleeds.   Eyes: Negative for blurred vision, pain and discharge.  Respiratory: Negative for sputum production, shortness of breath, wheezing and stridor.   Cardiovascular: Negative for chest pain, palpitations, orthopnea and PND.  Gastrointestinal: Positive for nausea. Negative for abdominal pain, diarrhea and vomiting.  Genitourinary: Negative for frequency and urgency.  Musculoskeletal: Negative for back pain and joint pain.  Neurological: Negative for sensory change, speech change, focal weakness and weakness.  Psychiatric/Behavioral: Negative for depression and hallucinations. The patient is not nervous/anxious.    Tolerating Diet:npo Tolerating PT: pending  DRUG ALLERGIES:   Allergies  Allergen Reactions  . Lactase Diarrhea  . Atenolol Rash  . Latex Rash    VITALS:  Blood pressure (!) 133/54, pulse 76, temperature 98.1 F (36.7 C), temperature source Oral, resp. rate 16, height 5\' 5"  (1.651 m), weight 57.5 kg, SpO2 97 %.  PHYSICAL EXAMINATION:   Physical Exam  GENERAL:  79 y.o.-year-old patient lying in the bed with no acute distress.  HEENT: Head atraumatic, normocephalic. Oropharynx and nasopharynx clear.  NG+ LUNGS: Normal breath sounds bilaterally, no wheezing, rales, rhonchi. No use of accessory muscles of respiration.  CARDIOVASCULAR: S1, S2 normal. No murmurs, rubs, or gallops.  ABDOMEN: Soft, + tender, +distended. Surgical dressing present. Left lower quadrant colostomy+ EXTREMITIES: No cyanosis, clubbing or edema b/l.    PICC+ NEUROLOGIC: Cranial nerves II through XII are intact. No focal Motor or sensory deficits b/l.   PSYCHIATRIC:  patient is alert and oriented x 3.  SKIN: No obvious rash, lesion, or ulcer.   LABORATORY PANEL:  CBC Recent Labs  Lab 03/08/20 0442  WBC 15.4*  HGB 9.7*  HCT 30.5*  PLT 414*    Chemistries  Recent Labs  Lab 03/08/20 0617  NA 133*  K 5.1  CL 104  CO2 22  GLUCOSE 363*  BUN 34*  CREATININE 1.59*  CALCIUM 8.2*  MG 1.7  AST 27  ALT 29  ALKPHOS 201*  BILITOT 0.8   Cardiac Enzymes No results for input(s): TROPONINI in the last 168 hours. RADIOLOGY:  No results found. ASSESSMENT AND PLAN:   Margaret Black is a 79 y.o. female with medical history significant for significant forGERD, COPD, lung cancer status post evaluation for radiation therapy, hypothyroidism, SLE and stage IV chronic kidney disease who presented to the hospital for an outpatient upper and lower endoscopy after evaluation for unintentional weight loss and poor appetite.  Admission was requested because patient complained of worsening lower abdominal pain for 2 days.   Acute recurrent diverticulitis   --CT scan repeated on 03/06/20--  showing developing abscess and small bowel obstruction.   -Case discussed with radiologist and IR does not believe they have a window to do a drainage.   -The patient currently on IV Zosyn and has previous failed outpatient therapy. -Patient has a PICC line and TPN for nutrition.   -POD # 1 s/p colectomy and colostomy  By Dr. Windell Moment.  -foley removed  Acute kidney injury on chronic kidney disease stage IIIb.  -- Creatinine  2.07 on presentation.  Creatinine 1.43--1.3  -- Continue IV fluid hydration along with TPN   Essential hypertension/ hyperaldosteronism  -on Bystolic, nifedipine and low-dose Aldactone  Hypothyroidism unspecified on Synthroid  GERD on Protonix  Anemia of chronic disease - hgb stable  Severe protein calorie malnutrition and  weight loss.   -On TPN  Hyperglycemia suspected due to TPN -- continue IV insulin via TPN. Appreciate diabetes coordinator input.   Procedures: Family communication :dter in the room Consults :GI, surgery CODE STATUS: DNR DVT Prophylaxis :lovenox  Status is: Inpatient  Remains inpatient appropriate because:Inpatient level of care appropriate due to severity of illness   Dispo: The patient is from: home              Anticipated d/c is to: Home              Anticipated d/c date is: 3-4 days               Patient currently is not medically stable to d/c. patient is postop day one colectomy with colostomy. Still NPO. Patient may require three or more days for recovery.       TOTAL TIME TAKING CARE OF THIS PATIENT: 25 minutes.  >50% time spent on counselling and coordination of care  Note: This dictation was prepared with Dragon dictation along with smaller phrase technology. Any transcriptional errors that result from this process are unintentional.  Fritzi Mandes M.D    [facility]   CC: Primary care physician; Olin Hauser, DOPatient ID: Margaret Black, female   DOB: 1940-04-25, 79 y.o.   MRN: [id number]

## 2020-03-08 NOTE — Care Management Important Message (Signed)
Important Message  Patient Details  Name: Margaret Black MRN: [id number] Date of Birth: 03/17/1941   Medicare Important Message Given:  Yes     Dannette Barbara 03/08/2020, 1:27 PM

## 2020-03-08 NOTE — Progress Notes (Addendum)
Inpatient Diabetes Program Recommendations  AACE/ADA: New Consensus Statement on Inpatient Glycemic Control   Target Ranges:  Prepandial:   less than 140 mg/dL      Peak postprandial:   less than 180 mg/dL (1-2 hours)      Critically ill patients:  140 - 180 mg/dL   Results for POONAM, WOEHRLE (MRN [id number]) as of 03/08/2020 06:44  Ref. Range 03/07/2020 05:20 03/07/2020 11:46 03/07/2020 20:52 03/07/2020 21:50 03/08/2020 00:26 03/08/2020 06:28  Glucose-Capillary Latest Ref Range: 70 - 99 mg/dL 206 (H) 222 (H) 238 (H) 221 (H) 304 (H) 321 (H)   Review of Glycemic Control Current orders for Inpatient glycemic control:Novolog 0-9 units Q6H; TPN @ 65 ml/hr with 25 units of Regular insulin  Inpatient Diabetes Program Recommendations:  Insulin: Please consider increasing insulin in TPN to 40 units and change frequency of CBGs to Q4H and increase Novolog to 0-15 units Q4H.  NOTE: Noted patient received one time dose of Decadron 5 mg at  17:28 on 03/07/20 (which is contributing to hyperglycemia) and TPN rate increased to 65 ml/hr.  Thanks, Barnie Alderman, RN, MSN, CDE Diabetes Coordinator Inpatient Diabetes Program [phone] (Team Pager from 8am to 5pm)

## 2020-03-09 LAB — MAGNESIUM: Magnesium: 2.2 mg/dL (ref 1.7–2.4)

## 2020-03-09 LAB — BASIC METABOLIC PANEL
Anion gap: 8 (ref 5–15)
BUN: 34 mg/dL — ABNORMAL HIGH (ref 8–23)
CO2: 24 mmol/L (ref 22–32)
Calcium: 8.6 mg/dL — ABNORMAL LOW (ref 8.9–10.3)
Chloride: 107 mmol/L (ref 98–111)
Creatinine, Ser: 1.61 mg/dL — ABNORMAL HIGH (ref 0.44–1.00)
GFR, Estimated: 32 mL/min — ABNORMAL LOW (ref >60)
Glucose, Bld: 181 mg/dL — ABNORMAL HIGH (ref 70–99)
Potassium: 4 mmol/L (ref 3.5–5.1)
Sodium: 139 mmol/L (ref 135–145)

## 2020-03-09 LAB — GLUCOSE, CAPILLARY
Glucose-Capillary: 143 mg/dL — ABNORMAL HIGH (ref 70–99)
Glucose-Capillary: 144 mg/dL — ABNORMAL HIGH (ref 70–99)
Glucose-Capillary: 147 mg/dL — ABNORMAL HIGH (ref 70–99)
Glucose-Capillary: 160 mg/dL — ABNORMAL HIGH (ref 70–99)
Glucose-Capillary: 175 mg/dL — ABNORMAL HIGH (ref 70–99)
Glucose-Capillary: 229 mg/dL — ABNORMAL HIGH (ref 70–99)

## 2020-03-09 MED ORDER — TRAVASOL 10 % IV SOLN
INTRAVENOUS | Status: AC
  Filled 2020-03-09: qty 858

## 2020-03-09 NOTE — TOC Progression Note (Signed)
Transition of Care [facility]) - Progression Note    Patient Details  Name: Margaret Black MRN: [id number] Date of Birth: 06/20/1940  Transition of Care [facility]) CM/SW [place], LCSW Phone Number: 03/09/2020, 3:39 PM  Clinical Narrative: Patient agreeable to home health at discharge. She has never had home health in the past. Left CMS scores for agencies that serve her zip code. Will follow up closer to discharge regarding preferences.    Expected Discharge Plan: Home/Self Care Barriers to Discharge: Continued Medical Work up  Expected Discharge Plan and Services Expected Discharge Plan: Home/Self Care       Living arrangements for the past 2 months: Single Family Home                                       Social Determinants of Health (SDOH) Interventions    Readmission Risk Interventions Readmission Risk Prevention Plan 03/02/2020  Transportation Screening Complete  PCP or Specialist Appt within 3-5 Days Complete  Social Work Consult for [place] Planning/Counseling [place] Not Applicable  Medication Review Press photographer) Complete  Some recent data might be hidden

## 2020-03-09 NOTE — Progress Notes (Signed)
Messaged NP Randol Kern, patient complaining of gas. NP recommended to continue to monitor, it is post op ileus and that lying on left side with right knee/hip flexed might help.

## 2020-03-09 NOTE — Progress Notes (Signed)
PHARMACY - TOTAL PARENTERAL NUTRITION CONSULT NOTE   Indication: severe diverticulitis  Patient Measurements: Height: 5\' 5"  (165.1 cm) Weight: 57.5 kg (126 lb 12.2 oz) IBW/kg (Calculated) : 57 TPN AdjBW (KG): 52.2 Body mass index is 21.09 kg/m.   Assessment:  recurrent diverticulitis,  per surgery note: this will be her third trial of antibiotics, second hospitalization. typical recommendation is to proceed with surgical resection of the colon.  S/P colectomy and end colostomy 12/15.   Glucose / Insulin: BG 147 - 321  SSI  SSI/24hr= 26 units required Electrolytes: WNL Renal: Scr 2.07 -->1.34-->1.61 LFTs / TGs: LFTs WNL   TG 123-->150 Prealbumin / albumin:   Alb 2.2-->1.7  preAlb 6.4-->7.2 Intake / Output; MIVF: 3846 / 6599 No MIVF GI Imaging: no recent Surgeries / Procedures:  12/15 partial colectomy and end colostomy Central access: PICC 03/03/20 TPN start date: 03/03/20  Nutritional Goals (per RD recommendation on 03/05/20): kCal: 1565-1830, Protein: 80-95 g, Fluid: >/= 1.3 L   Current Nutrition:  NPO   Plan:   continue TPN rate at 65 ml/hr (total volume 1660 mL)  Nutritional Components: ? Protein: 55 G/L- Clinisol 10% (85.8 g/day)  ? Dextrose: 21 % (327.6 g/day) ? Lipids: 18 g/L (28 g/day)   ? kCal: 1737/day  Electrolytes in TPN: 35mEq/L of Na, 30 mEq/L of K, 15mEq/L of Ca, 65mEq/L of Mg, and 13mmol/L of Phos. Cl:Ac ratio 1:1  Add 40 units regular insulin to TPN  Add standard MVI and trace elements to TPN daily  continue moderate SSI q4h   Monitor TPN labs on Mon/Thurs, daily until stable   [name] 03/09/2020,7:36 AM

## 2020-03-09 NOTE — Plan of Care (Signed)

## 2020-03-09 NOTE — Consult Note (Signed)
[place] Nurse ostomy consult note Stoma type/location:  Colostomy surgery was performed 12/15 Stomal assessment/size: Stoma is red and viable, slightly above skin level, 1 1/4 inches Peristomal assessment:  Intact skin surrounding Output: no stool or flatus at this time  Ostomy pouching: 2pc.  Education provided: Current pouch is leaking behind the barrier.  Applied barrier ring to attempt to maintain a seal and 2 piece pouch.  Demonstrated procedure to patient using a hand held mirror.  She watched and asked appropriate questions.  Daughter at bedside is familiar with pouching activities and made a video on her phone for use later. Pt was able to open and close velcro to empty.  Educational materials and 4 extra sets of barrier rings/wafers/pouches left in the room.   [place] team will perform another teaching session on Mon.  Enrolled patient in [place] program: Yes Julien Girt MSN, RN, [place], [place], [place]

## 2020-03-09 NOTE — Progress Notes (Signed)
[facility] Hospital Day(s): 8.   Post op day(s): 2 Days Post-Op.   Interval History: Patient seen and examined, no acute events or new complaints overnight. Patient reports feeling ok. Denies any new complain. Denies nausea and vomiting.   Vital signs in last 24 hours: [min-max] current  Temp:  [98 F (36.7 C)-98.6 F (37 C)] 98 F (36.7 C) (12/17 0806) Pulse Rate:  [76-86] 78 (12/17 0806) Resp:  [12-20] 18 (12/17 0806) BP: (121-137)/(46-54) 121/46 (12/17 0806) SpO2:  [95 %-98 %] 97 % (12/17 0806)     Height: 5\' 5"  (165.1 cm) Weight: 57.5 kg BMI (Calculated): 21.09   Physical Exam:  Constitutional: alert, cooperative and no distress  Respiratory: breathing non-labored at rest  Cardiovascular: regular rate and sinus rhythm  Gastrointestinal: soft, non-tender, and non-distended. Colostomy pink and patent.   Labs:  CBC Latest Ref Rng & Units 03/08/2020 03/05/2020 03/04/2020  WBC 4.0 - 10.5 K/uL 15.4(H) 10.3 12.3(H)  Hemoglobin 12.0 - 15.0 g/dL 9.7(L) 10.1(L) 9.4(L)  Hematocrit 36.0 - 46.0 % 30.5(L) 28.4(L) 26.9(L)  Platelets 150 - 400 K/uL 414(H) 277 263   CMP Latest Ref Rng & Units 03/09/2020 03/08/2020 03/07/2020  Glucose 70 - 99 mg/dL 181(H) 363(H) 213(H)  BUN 8 - 23 mg/dL 34(H) 34(H) 23  Creatinine 0.44 - 1.00 mg/dL 1.61(H) 1.59(H) 1.34(H)  Sodium 135 - 145 mmol/L 139 133(L) 137  Potassium 3.5 - 5.1 mmol/L 4.0 5.1 4.3  Chloride 98 - 111 mmol/L 107 104 104  CO2 22 - 32 mmol/L 24 22 26   Calcium 8.9 - 10.3 mg/dL 8.6(L) 8.2(L) 8.7(L)  Total Protein 6.5 - 8.1 g/dL - 4.7(L) -  Total Bilirubin 0.3 - 1.2 mg/dL - 0.8 -  Alkaline Phos 38 - 126 U/L - 201(H) -  AST 15 - 41 U/L - 27 -  ALT 0 - 44 U/L - 29 -    Imaging studies: No new pertinent imaging studies   Assessment/Plan:  79 y.o. female with acute diverticulitis with abscess and perforation 2 Day Post-Op s/p partial colectomy and end colostomy, complicated by pertinent comorbidities including anxiety  disorder, chronic kidney disease, COPD, hypertension, hypothyroidism, irritable bowel syndrome, history of lung cancer, lupus.  Patient without any clinical deterioration.  Patient recovering slowly.  Still without any flatus from the colostomy.  I will let patient has ice chips.  I encouraged the patient to ambulate.  I agreed to continue IV antibiotic therapy.  I agreed to continue TPN.  Appreciate hospitalist management of medical comorbidities.  Arnold Long, MD

## 2020-03-09 NOTE — Progress Notes (Signed)
PT Cancellation Note  Patient Details Name: Margaret Black MRN: [id number] DOB: 05/04/1940   Cancelled Treatment:    Reason Eval/Treat Not Completed: Other (comment) Orders received, chart reviewed, pt resting in bed with her dtr present reporting pain, waiting on medication.  Dtr and pt requesting PT return tomorrow. Will f/u as able.  Duanne Guess, PT, DPT 03/09/20, 12:03 PM   Isaias Cowman 03/09/2020, 12:02 PM

## 2020-03-09 NOTE — Progress Notes (Signed)
[place] at [place] NAME: Margaret Black    MR#:  [id number]  DATE OF BIRTH:  [birth year]  SUBJECTIVE:  POD #2 --overall stable. Some abdominal pain. Very positive outlook!  ON TPN with Insulin ambulated with her daughter in the hallway for short distance.  REVIEW OF SYSTEMS:   Review of Systems  Constitutional: Negative for chills, fever and weight loss.  HENT: Negative for ear discharge, ear pain and nosebleeds.   Eyes: Negative for blurred vision, pain and discharge.  Respiratory: Negative for sputum production, shortness of breath, wheezing and stridor.   Cardiovascular: Negative for chest pain, palpitations, orthopnea and PND.  Gastrointestinal: Positive for nausea. Negative for abdominal pain, diarrhea and vomiting.  Genitourinary: Negative for frequency and urgency.  Musculoskeletal: Negative for back pain and joint pain.  Neurological: Negative for sensory change, speech change, focal weakness and weakness.  Psychiatric/Behavioral: Negative for depression and hallucinations. The patient is not nervous/anxious.    Tolerating Diet:ice chips Tolerating PT: ambulated by herself earlier today. DRUG ALLERGIES:   Allergies  Allergen Reactions  . Lactase Diarrhea  . Atenolol Rash  . Latex Rash    VITALS:  Blood pressure (!) 121/46, pulse 78, temperature 98 F (36.7 C), temperature source Oral, resp. rate 18, height 5\' 5"  (1.651 m), weight 57.5 kg, SpO2 97 %.  PHYSICAL EXAMINATION:   Physical Exam  GENERAL:  79 y.o.-year-old patient lying in the bed with no acute distress.  HEENT: Head atraumatic, normocephalic. Oropharynx and nasopharynx clear.  NG+ LUNGS: Normal breath sounds bilaterally, no wheezing, rales, rhonchi. No use of accessory muscles of respiration.  CARDIOVASCULAR: S1, S2 normal. No murmurs, rubs, or gallops.  ABDOMEN: Soft, + tender, +distended. Surgical dressing present. Left lower  colostomy+ EXTREMITIES: No  cyanosis, clubbing or edema b/l.   PICC+ NEUROLOGIC: Cranial nerves II through XII are intact. No focal Motor or sensory deficits b/l.   PSYCHIATRIC:  patient is alert and oriented x 3.  SKIN: No obvious rash, lesion, or ulcer.   LABORATORY PANEL:  CBC Recent Labs  Lab 03/08/20 0442  WBC 15.4*  HGB 9.7*  HCT 30.5*  PLT 414*    Chemistries  Recent Labs  Lab 03/08/20 0617 03/09/20 0409  NA 133* 139  K 5.1 4.0  CL 104 107  CO2 22 24  GLUCOSE 363* 181*  BUN 34* 34*  CREATININE 1.59* 1.61*  CALCIUM 8.2* 8.6*  MG 1.7 2.2  AST 27  --   ALT 29  --   ALKPHOS 201*  --   BILITOT 0.8  --    Cardiac Enzymes No results for input(s): TROPONINI in the last 168 hours. RADIOLOGY:  No results found. ASSESSMENT AND PLAN:   Margaret Black is a 79 y.o. female with medical history significant for significant forGERD, COPD, lung cancer status post evaluation for radiation therapy, hypothyroidism, SLE and stage IV chronic kidney disease who presented to the hospital for an outpatient upper and lower endoscopy after evaluation for unintentional weight loss and poor appetite.  Admission was requested because patient complained of worsening lower abdominal pain for 2 days.   Acute recurrent diverticulitis   --CT scan repeated on 03/06/20--  showing developing abscess and small bowel obstruction.   -Case discussed with radiologist and IR does not believe they have a window to do a drainage.   -currently on IV Zosyn   -Patient has a PICC line and TPN for nutrition.   -POD # 2  s/p colectomy and colostomy  By Dr. Windell Moment.  -foley removed  Acute kidney injury on chronic kidney disease stage IIIb.  -- Creatinine 2.07 on presentation.  Creatinine 1.43--1.3  -- Continue IV fluid hydration along with TPN   Essential hypertension/ hyperaldosteronism  -on Bystolic, nifedipine and low-dose Aldactone  Hypothyroidism unspecified on Synthroid  GERD on Protonix  Anemia of chronic disease - hgb  stable  Severe protein calorie malnutrition and weight loss.   -On TPN  Hyperglycemia suspected due to TPN -- continue IV insulin via TPN. Appreciate diabetes coordinator input.   Procedures: Colectomy with colostomy Family communication :dter in the room Consults :GI, surgery CODE STATUS: DNR DVT Prophylaxis :lovenox  Status is: Inpatient  Remains inpatient appropriate because:Inpatient level of care appropriate due to severity of illness   Dispo: The patient is from: home              Anticipated d/c is to: Home              Anticipated d/c date is: 3-4 days               Patient currently is not medically stable to d/c. patient is postop day one colectomy with colostomy. Still NPO. Patient may require three or more days for recovery.       TOTAL TIME TAKING CARE OF THIS PATIENT: 25 minutes.  >50% time spent on counselling and coordination of care  Note: This dictation was prepared with Dragon dictation along with smaller phrase technology. Any transcriptional errors that result from this process are unintentional.  Fritzi Mandes M.D    [facility]   CC: Primary care physician; Olin Hauser, DOPatient ID: Margaret Black, female   DOB: 03/03/1941, 79 y.o.   MRN: [id number]

## 2020-03-10 DIAGNOSIS — R110 Nausea: Secondary | ICD-10-CM

## 2020-03-10 LAB — AEROBIC CULTURE W GRAM STAIN (SUPERFICIAL SPECIMEN)

## 2020-03-10 LAB — GLUCOSE, CAPILLARY
Glucose-Capillary: 160 mg/dL — ABNORMAL HIGH (ref 70–99)
Glucose-Capillary: 191 mg/dL — ABNORMAL HIGH (ref 70–99)
Glucose-Capillary: 196 mg/dL — ABNORMAL HIGH (ref 70–99)
Glucose-Capillary: 199 mg/dL — ABNORMAL HIGH (ref 70–99)
Glucose-Capillary: 205 mg/dL — ABNORMAL HIGH (ref 70–99)
Glucose-Capillary: 212 mg/dL — ABNORMAL HIGH (ref 70–99)

## 2020-03-10 LAB — BASIC METABOLIC PANEL
Anion gap: 9 (ref 5–15)
BUN: 32 mg/dL — ABNORMAL HIGH (ref 8–23)
CO2: 24 mmol/L (ref 22–32)
Calcium: 8.4 mg/dL — ABNORMAL LOW (ref 8.9–10.3)
Chloride: 107 mmol/L (ref 98–111)
Creatinine, Ser: 1.57 mg/dL — ABNORMAL HIGH (ref 0.44–1.00)
GFR, Estimated: 33 mL/min — ABNORMAL LOW (ref >60)
Glucose, Bld: 206 mg/dL — ABNORMAL HIGH (ref 70–99)
Potassium: 3.7 mmol/L (ref 3.5–5.1)
Sodium: 140 mmol/L (ref 135–145)

## 2020-03-10 MED ORDER — SODIUM CHLORIDE 0.9 % IV SOLN: INTRAVENOUS | Status: DC

## 2020-03-10 MED ORDER — PANTOPRAZOLE SODIUM 40 MG IV SOLR
40.0000 mg | Freq: Two times a day (BID) | INTRAVENOUS | Status: DC
  Administered 2020-03-10 – 2020-03-20 (×20): 40 mg via INTRAVENOUS
  Filled 2020-03-10 (×21): qty 40

## 2020-03-10 MED ORDER — PROMETHAZINE HCL 25 MG/ML IJ SOLN
12.5000 mg | Freq: Once | INTRAMUSCULAR | Status: AC
  Administered 2020-03-10: 12.5 mg via INTRAVENOUS
  Filled 2020-03-10: qty 1

## 2020-03-10 MED ORDER — PROMETHAZINE HCL 25 MG/ML IJ SOLN
12.5000 mg | Freq: Four times a day (QID) | INTRAMUSCULAR | Status: DC | PRN
  Administered 2020-03-10 – 2020-03-11 (×4): 12.5 mg via INTRAVENOUS
  Filled 2020-03-10 (×6): qty 1

## 2020-03-10 MED ORDER — TRAVASOL 10 % IV SOLN
INTRAVENOUS | Status: AC
  Filled 2020-03-10: qty 858

## 2020-03-10 NOTE — Progress Notes (Signed)
[facility] Hospital Day(s): 9.   Post op day(s): 3 Days Post-Op.   Interval History: Patient seen and examined, no acute events or new complaints overnight. Patient reports feeling nauseated. Reports having pain in the abdomen. Denies fever or chills. Denies any function of the colostomy.   Vital signs in last 24 hours: [min-max] current  Temp:  [98.4 F (36.9 C)-98.7 F (37.1 C)] 98.4 F (36.9 C) (12/18 0456) Pulse Rate:  [74-82] 74 (12/18 0456) Resp:  [16-20] 20 (12/18 0456) BP: (137-159)/(47-67) 159/67 (12/18 0456) SpO2:  [97 %-99 %] 99 % (12/18 0456)     Height: 5\' 5"  (165.1 cm) Weight: 57.5 kg BMI (Calculated): 21.09   Physical Exam:  Constitutional: alert, cooperative and no distress  Respiratory: breathing non-labored at rest  Cardiovascular: regular rate and sinus rhythm  Gastrointestinal: soft, non-tender, and distended  Labs:  CBC Latest Ref Rng & Units 03/08/2020 03/05/2020 03/04/2020  WBC 4.0 - 10.5 K/uL 15.4(H) 10.3 12.3(H)  Hemoglobin 12.0 - 15.0 g/dL 9.7(L) 10.1(L) 9.4(L)  Hematocrit 36.0 - 46.0 % 30.5(L) 28.4(L) 26.9(L)  Platelets 150 - 400 K/uL 414(H) 277 263   CMP Latest Ref Rng & Units 03/10/2020 03/09/2020 03/08/2020  Glucose 70 - 99 mg/dL 206(H) 181(H) 363(H)  BUN 8 - 23 mg/dL 32(H) 34(H) 34(H)  Creatinine 0.44 - 1.00 mg/dL 1.57(H) 1.61(H) 1.59(H)  Sodium 135 - 145 mmol/L 140 139 133(L)  Potassium 3.5 - 5.1 mmol/L 3.7 4.0 5.1  Chloride 98 - 111 mmol/L 107 107 104  CO2 22 - 32 mmol/L 24 24 22   Calcium 8.9 - 10.3 mg/dL 8.4(L) 8.6(L) 8.2(L)  Total Protein 6.5 - 8.1 g/dL - - 4.7(L)  Total Bilirubin 0.3 - 1.2 mg/dL - - 0.8  Alkaline Phos 38 - 126 U/L - - 201(H)  AST 15 - 41 U/L - - 27  ALT 0 - 44 U/L - - 29    Imaging studies: No new pertinent imaging studies   Assessment/Plan:  79 y.o.femalewith acute diverticulitis with abscess and perforation2 Day Post-Ops/p partial colectomy and end colostomy, complicated by pertinent  comorbidities includinganxiety disorder, chronic kidney disease, COPD, hypertension, hypothyroidism, irritable bowel syndrome, history of lung cancer, lupus.  Patient with persistent ileus which is expected. No fever or chills. Will put patient to low intermittent suction. 600 mL of bile drained. Will continue with NPO, TPN. Patient ambulating. Wound is dry and clean. Will continue with IV abx therapy due to intra abdominal infection present before surgery. Appreciated Hospitalist management of medical comorbilities.   Arnold Long, MD

## 2020-03-10 NOTE — Progress Notes (Signed)
[facility] at [place] NAME: Tiajuana Leppanen    MR#:  [id number]  DATE OF BIRTH:  January 16, 1941  SUBJECTIVE:  POD # 3 very nauseous with abdominal pain. Place don Low intermittent suction and about 600 mL of bile constrained. ON TPN with Insulin ambulated with her daughter in the hallway for short distance earlier.  REVIEW OF SYSTEMS:   Review of Systems  Constitutional: Negative for chills, fever and weight loss.  HENT: Negative for ear discharge, ear pain and nosebleeds.   Eyes: Negative for blurred vision, pain and discharge.  Respiratory: Negative for sputum production, shortness of breath, wheezing and stridor.   Cardiovascular: Negative for chest pain, palpitations, orthopnea and PND.  Gastrointestinal: Positive for nausea. Negative for abdominal pain, diarrhea and vomiting.  Genitourinary: Negative for frequency and urgency.  Musculoskeletal: Negative for back pain and joint pain.  Neurological: Negative for sensory change, speech change, focal weakness and weakness.  Psychiatric/Behavioral: Negative for depression and hallucinations. The patient is not nervous/anxious.    Tolerating Diet:ice chips Tolerating PT: ambulated by herself earlier today. DRUG ALLERGIES:   Allergies  Allergen Reactions  . Lactase Diarrhea  . Atenolol Rash  . Latex Rash    VITALS:  Blood pressure (!) 159/67, pulse 74, temperature 98.4 F (36.9 C), temperature source Oral, resp. rate 20, height 5\' 5"  (1.651 m), weight 57.5 kg, SpO2 99 %.  PHYSICAL EXAMINATION:   Physical Exam  GENERAL:  79 y.o.-year-old patient lying in the bed with no acute distress.  HEENT: Head atraumatic, normocephalic. Oropharynx and nasopharynx clear.  NG+ LUNGS: Normal breath sounds bilaterally, no wheezing, rales, rhonchi. No use of accessory muscles of respiration.  CARDIOVASCULAR: S1, S2 normal. No murmurs, rubs, or gallops.  ABDOMEN: Soft, + tender, +distended. Surgical  dressing present. Left lower  colostomy+ EXTREMITIES: No cyanosis, clubbing or edema b/l.   PICC+ NEUROLOGIC: Cranial nerves II through XII are intact. No focal Motor or sensory deficits b/l.   PSYCHIATRIC:  patient is alert and oriented x 3.  SKIN: No obvious rash, lesion, or ulcer.   LABORATORY PANEL:  CBC Recent Labs  Lab 03/08/20 0442  WBC 15.4*  HGB 9.7*  HCT 30.5*  PLT 414*    Chemistries  Recent Labs  Lab 03/08/20 0617 03/09/20 0409 03/10/20 0416  NA 133* 139 140  K 5.1 4.0 3.7  CL 104 107 107  CO2 22 24 24   GLUCOSE 363* 181* 206*  BUN 34* 34* 32*  CREATININE 1.59* 1.61* 1.57*  CALCIUM 8.2* 8.6* 8.4*  MG 1.7 2.2  --   AST 27  --   --   ALT 29  --   --   ALKPHOS 201*  --   --   BILITOT 0.8  --   --    Cardiac Enzymes No results for input(s): TROPONINI in the last 168 hours. RADIOLOGY:  No results found. ASSESSMENT AND PLAN:   TAKEILA THAYNE is a 79 y.o. female with medical history significant for significant forGERD, COPD, lung cancer status post evaluation for radiation therapy, hypothyroidism, SLE and stage IV chronic kidney disease who presented to the hospital for an outpatient upper and lower endoscopy after evaluation for unintentional weight loss and poor appetite.  Admission was requested because patient complained of worsening lower abdominal pain for 2 days.   Acute recurrent diverticulitis   --CT scan repeated on 03/06/20--  showing developing abscess and small bowel obstruction.   -Case discussed with radiologist  and IR does not believe they have a window to do a drainage.   -currently on IV Zosyn   -Patient has a PICC line and TPN for nutrition.   -POD # 3 s/p colectomy and colostomy  By Dr. Windell Moment.  -foley removed --12/18-- severe nausea. Low intermittent suction place. 600 mL of bile drained immediately. Giving PRN Phenergan alternating with Zofran. -- No colostomy output. Continue ice chips.  Acute kidney injury on chronic kidney  disease stage IIIb.  -- Creatinine 2.07 on presentation.  Creatinine 1.43--1.3  -- Continue IV fluid hydration along with TPN   Essential hypertension/ hyperaldosteronism  -on Bystolic, nifedipine and low-dose Aldactone  Hypothyroidism unspecified on Synthroid  GERD on Protonix  Anemia of chronic disease - hgb stable  Severe protein calorie malnutrition and weight loss.   -On TPN  Hyperglycemia suspected due to TPN -- continue IV insulin via TPN. Appreciate diabetes coordinator input.   Procedures: Colectomy with colostomy Family communication :dter in the room Consults :GI, surgery CODE STATUS: DNR DVT Prophylaxis :lovenox  Status is: Inpatient  Remains inpatient appropriate because:Inpatient level of care appropriate due to severity of illness   Dispo: The patient is from: home              Anticipated d/c is to: Home              Anticipated d/c date is: 3-4 days               Patient currently is not medically stable to d/c. patient is postop day 3 with colectomy with colostomy. Still NPO.  Significant nausea today. Slow improvement. Placed back on NG section.   TOTAL TIME TAKING CARE OF THIS PATIENT: 25 minutes.  >50% time spent on counselling and coordination of care  Note: This dictation was prepared with Dragon dictation along with smaller phrase technology. Any transcriptional errors that result from this process are unintentional.  Fritzi Mandes M.D    [facility]   CC: Primary care physician; Olin Hauser, DOPatient ID: [name], female   DOB: 09-03-1940, 79 y.o.   MRN: [id number]

## 2020-03-10 NOTE — Plan of Care (Signed)
Continuing with plan of care. 

## 2020-03-10 NOTE — Progress Notes (Signed)
PT Cancellation Note  Patient Details Name: ANIYLAH AVANS MRN: [id number] DOB: 24-Sep-1940   Cancelled Treatment:    Reason Eval/Treat Not Completed: Other (comment) Spoke with DTR in room and nurse, both reports pt not up to completing PT eval today due to patient's nausea and fatigue.   Isaias Cowman 03/10/2020, 12:15 PM

## 2020-03-10 NOTE — Progress Notes (Addendum)
PHARMACY - TOTAL PARENTERAL NUTRITION CONSULT NOTE   Indication: severe diverticulitis  Patient Measurements: Height: 5\' 5"  (165.1 cm) Weight: 57.5 kg (126 lb 12.2 oz) IBW/kg (Calculated) : 57 TPN AdjBW (KG): 52.2 Body mass index is 21.09 kg/m.   Assessment:  recurrent diverticulitis,  per surgery note: this will be her third trial of antibiotics, second hospitalization. typical recommendation is to proceed with surgical resection of the colon.  S/P colectomy and end colostomy 12/15.   Glucose / Insulin: BG 196-206  SSI  SSI/24hr= 7 units required Electrolytes: WNL Renal: Scr 2.07 -->1.34-->1.61> 1.57 LFTs / TGs: LFTs WNL   TG 123-->150 Prealbumin / albumin:   Alb 2.2-->1.7  preAlb 6.4-->7.2 Intake / Output; MIVF: 0814 / 4818 No MIVF GI Imaging: no recent Surgeries / Procedures:  12/15 partial colectomy and end colostomy Central access: PICC 03/03/20 TPN start date: 03/03/20  Nutritional Goals (per RD recommendation on 03/05/20): kCal: 1565-1830, Protein: 80-95 g, Fluid: >/= 1.3 L   Current Nutrition:  NPO   Plan:   continue TPN rate at 65 ml/hr (total volume 1660 mL)  Nutritional Components: ? Protein: 55 G/L- Clinisol 10% (85.8 g/day)  ? Dextrose: 21 % (327.6 g/day) ? Lipids: 18 g/L (28 g/day)   ? kCal: 1737/day  Electrolytes in TPN: 63mEq/L of Na, 30 mEq/L of K, 29mEq/L of Ca, 23mEq/L of Mg, and 72mmol/L of Phos. Cl:Ac ratio 1:1  Add 40 units regular insulin to TPN  Add standard MVI and trace elements to TPN daily  continue moderate SSI q4h   Monitor TPN labs on Mon/Thurs, daily until stable   [employer] 03/10/2020,10:53 AM

## 2020-03-11 LAB — CBC
HCT: 24.5 % — ABNORMAL LOW (ref 36.0–46.0)
Hemoglobin: 8.1 g/dL — ABNORMAL LOW (ref 12.0–15.0)
MCH: 32.3 pg (ref 26.0–34.0)
MCHC: 33.1 g/dL (ref 30.0–36.0)
MCV: 97.6 fL (ref 80.0–100.0)
Platelets: 499 10*3/uL — ABNORMAL HIGH (ref 150–400)
RBC: 2.51 MIL/uL — ABNORMAL LOW (ref 3.87–5.11)
RDW: 15 % (ref 11.5–15.5)
WBC: 12.9 10*3/uL — ABNORMAL HIGH (ref 4.0–10.5)
nRBC: 0 % (ref 0.0–0.2)

## 2020-03-11 LAB — GLUCOSE, CAPILLARY
Glucose-Capillary: 149 mg/dL — ABNORMAL HIGH (ref 70–99)
Glucose-Capillary: 116 mg/dL — ABNORMAL HIGH (ref 70–99)
Glucose-Capillary: 126 mg/dL — ABNORMAL HIGH (ref 70–99)
Glucose-Capillary: 114 mg/dL — ABNORMAL HIGH (ref 70–99)

## 2020-03-11 LAB — CREATININE, SERUM
Creatinine, Ser: 1.5 mg/dL — ABNORMAL HIGH (ref 0.44–1.00)
GFR, Estimated: 35 mL/min — ABNORMAL LOW (ref >60)

## 2020-03-11 MED ORDER — TRAVASOL 10 % IV SOLN
INTRAVENOUS | Status: AC
  Filled 2020-03-11: qty 858

## 2020-03-11 MED ORDER — IPRATROPIUM-ALBUTEROL 0.5-2.5 (3) MG/3ML IN SOLN: 3.0000 mL | Freq: Four times a day (QID) | RESPIRATORY_TRACT | Status: DC | PRN

## 2020-03-11 NOTE — Progress Notes (Signed)
Patient continues to refuse midnight vitals and glucose checks.

## 2020-03-11 NOTE — Progress Notes (Signed)
PHARMACY - TOTAL PARENTERAL NUTRITION CONSULT NOTE   Indication: severe diverticulitis  Patient Measurements: Height: 5\' 5"  (165.1 cm) Weight: 57 kg (125 lb 10.6 oz) IBW/kg (Calculated) : 57 TPN AdjBW (KG): 52.2 Body mass index is 20.91 kg/m.   Assessment:  recurrent diverticulitis,  per surgery note: this will be her third trial of antibiotics, second hospitalization. typical recommendation is to proceed with surgical resection of the colon.  S/P colectomy and end colostomy 12/15.   Glucose / Insulin: BG 116  SSI  SSI/24hr= 0 units required Electrolytes: WNL Renal: Scr 2.07 -->1.34-->1.61> 1.57 LFTs / TGs: LFTs WNL   TG 123-->150 Prealbumin / albumin:   Alb 2.2-->1.7  preAlb 6.4-->7.2 Intake / Output; MIVF: 6256 / 3893 No MIVF GI Imaging: no recent Surgeries / Procedures:  12/15 partial colectomy and end colostomy Central access: PICC 03/03/20 TPN start date: 03/03/20  Nutritional Goals (per RD recommendation on 03/05/20): kCal: 1565-1830, Protein: 80-95 g, Fluid: >/= 1.3 L   Current Nutrition:  NPO   Plan:   continue TPN rate at 65 ml/hr (total volume 1660 mL)  Nutritional Components: ? Protein: 55 G/L- Clinisol 10% (85.8 g/day)  ? Dextrose: 21 % (327.6 g/day) ? Lipids: 18 g/L (28 g/day)   ? kCal: 1737/day  Electrolytes in TPN: 51mEq/L of Na, 30 mEq/L of K, 30mEq/L of Ca, 20mEq/L of Mg, and 34mmol/L of Phos. Cl:Ac ratio 1:1  Will take out insulin from TPN.   Add standard MVI and trace elements to TPN daily  continue moderate SSI q4h   Monitor TPN labs on Mon/Thurs, daily until stable   [employer] 03/11/2020,10:29 AM

## 2020-03-11 NOTE — Progress Notes (Signed)
[facility] Hospital Day(s): 10.   Post op day(s): 4 Days Post-Op.   Interval History: Patient seen and examined, no acute events or new complaints overnight. Patient reports feeling the same. Denies nausea or vomiting today. Denies bowel function.  Vital signs in last 24 hours: [min-max] current  Temp:  [97.8 F (36.6 C)-98.5 F (36.9 C)] 97.9 F (36.6 C) (12/19 0411) Pulse Rate:  [77-80] 78 (12/19 0411) Resp:  [16-18] 18 (12/19 0411) BP: (130-155)/(50-59) 146/55 (12/19 0411) SpO2:  [98 %-100 %] 100 % (12/19 0411) Weight:  [57 kg] 57 kg (12/19 0500)     Height: 5\' 5"  (165.1 cm) Weight: 57 kg BMI (Calculated): 20.91   Physical Exam:  Constitutional: alert, cooperative and no distress  Respiratory: breathing non-labored at rest  Cardiovascular: regular rate and sinus rhythm  Gastrointestinal: soft, non-tender, and distended  Labs:  CBC Latest Ref Rng & Units 03/11/2020 03/08/2020 03/05/2020  WBC 4.0 - 10.5 K/uL 12.9(H) 15.4(H) 10.3  Hemoglobin 12.0 - 15.0 g/dL 8.1(L) 9.7(L) 10.1(L)  Hematocrit 36.0 - 46.0 % 24.5(L) 30.5(L) 28.4(L)  Platelets 150 - 400 K/uL 499(H) 414(H) 277   CMP Latest Ref Rng & Units 03/10/2020 03/09/2020 03/08/2020  Glucose 70 - 99 mg/dL 206(H) 181(H) 363(H)  BUN 8 - 23 mg/dL 32(H) 34(H) 34(H)  Creatinine 0.44 - 1.00 mg/dL 1.57(H) 1.61(H) 1.59(H)  Sodium 135 - 145 mmol/L 140 139 133(L)  Potassium 3.5 - 5.1 mmol/L 3.7 4.0 5.1  Chloride 98 - 111 mmol/L 107 107 104  CO2 22 - 32 mmol/L 24 24 22   Calcium 8.9 - 10.3 mg/dL 8.4(L) 8.6(L) 8.2(L)  Total Protein 6.5 - 8.1 g/dL - - 4.7(L)  Total Bilirubin 0.3 - 1.2 mg/dL - - 0.8  Alkaline Phos 38 - 126 U/L - - 201(H)  AST 15 - 41 U/L - - 27  ALT 0 - 44 U/L - - 29    Imaging studies: No new pertinent imaging studies   Assessment/Plan:  79 y.o.femalewith acute diverticulitis with abscess and perforation4Day Post-Ops/p partial colectomy and end colostomy, complicated by pertinent  comorbidities includinganxiety disorder, chronic kidney disease, COPD, hypertension, hypothyroidism, irritable bowel syndrome, history of lung cancer, lupus.  Patient with persistent ileus. Will continue NPO, TPN and IV abx therapy. Will continue NGT to low intermittent suction. Patient ambulating. Will continue with DVT prophylaxis. Appreciate Hospitalist for the management of medical comorbidities.   Arnold Long, MD

## 2020-03-11 NOTE — Plan of Care (Signed)
Continuing with plan of care. 

## 2020-03-11 NOTE — Evaluation (Signed)
Physical Therapy Re-Evaluation Patient Details Name: Margaret Black MRN: [id number] DOB: 03/06/1941 Today's Date: 03/11/2020   History of Present Illness  Pt is a 79 y/o F who presented to the hospital on 03/01/20 for endoscopy 2/2 unintentional weight loss & poor appetite. CT scan of abdomen and pelvis done without contrast showed persistent evidence of diverticulitis. CT on 03/06/20 revealed developing abcess & SBO. Pt underwent surgery on 03/07/20 for partial colectomy & colostomy. PMH: COPD, lung CA, SLE, Stage IV CKD, anxiety, L eye visual impairments, osteopenia, uterine CA, thyroid CA  Clinical Impression  Pt seen for re-evaluation with encouragement for participation from PT & daughter. Pt's daughter assisting pt with toileting upon PT arrival, declining need for PT assistance. Pt willing to ambulate in hallway & does so x 250 ft with BUE HHA min assist +2 due to unsteadiness on feet. Pt with sufficient endurance but demonstrates global weakness & impaired standing balance. Pt would benefit from acute PT services to address deficits noted to maximize independence with functional mobility prior to d/c. Pt would benefit from HHPT but pt's daughter doesn't feel like they need those services.   Pt's plan of care & goals remain appropriate at this time.     Follow Up Recommendations Home health PT;Supervision for mobility/OOB    Equipment Recommendations  Rolling walker with 5" wheels;3in1 (PT)    Recommendations for Other Services       Precautions / Restrictions Precautions Precautions: Fall Restrictions Weight Bearing Restrictions: No      Mobility  Bed Mobility Overal bed mobility: Needs Assistance Bed Mobility: Sit to Supine       Sit to supine: Supervision;HOB elevated   General bed mobility comments: extra time to elevate BLE onto bed (daughter reports this is first time she's been able to place BLE in bed without assistance since surgery)    Transfers Overall  transfer level:  (daugther assisted pt with sit>stand from toilet)                  Ambulation/Gait Ambulation/Gait assistance: Min assist;+2 physical assistance Gait Distance (Feet): 250 Feet Assistive device: 2 person hand held assist Gait Pattern/deviations: Decreased stride length;Decreased step length - left;Decreased step length - right Gait velocity: decreased      Stairs            Wheelchair Mobility    Modified Rankin (Stroke Patients Only)       Balance Overall balance assessment: Modified Independent         Standing balance support: Bilateral upper extremity supported;During functional activity Standing balance-Leahy Scale: Poor Standing balance comment: BUE HHA support during gait                             Pertinent Vitals/Pain Pain Assessment: No/denies pain (no c/o pain reported during session)    Home Living Family/patient expects to be discharged to:: Private residence Living Arrangements: Spouse/significant other Available Help at Discharge: Family;Available PRN/intermittently Type of Home: House Home Access: Level entry     Home Layout: One level Home Equipment: None      Prior Function Level of Independence: Independent         Comments: driving, cooking, independent without AD     Hand Dominance        Extremity/Trunk Assessment   Upper Extremity Assessment Upper Extremity Assessment: Generalized weakness    Lower Extremity Assessment Lower Extremity Assessment: Generalized weakness  Communication   Communication: No difficulties  Cognition Arousal/Alertness: Awake/alert Behavior During Therapy: WFL for tasks assessed/performed Overall Cognitive Status: Within Functional Limits for tasks assessed                                 General Comments: slightly irritable at beginning of session but agreeable to participating with encouragement from PT & daughter; pt very thankful  & appreciative at end of session      General Comments      Exercises     Assessment/Plan    PT Assessment Patient needs continued PT services  PT Problem List Decreased strength;Decreased mobility;Decreased activity tolerance;Decreased balance;Decreased knowledge of use of DME       PT Treatment Interventions Functional mobility training;DME instruction;Patient/family education;Balance training;Gait training;Therapeutic activities;Neuromuscular re-education;Stair training;Therapeutic exercise    PT Goals (Current goals can be found in the Care Plan section)  Acute Rehab PT Goals Patient Stated Goal: get better PT Goal Formulation: With patient/family Time For Goal Achievement: 03/25/20 Potential to Achieve Goals: Good    Frequency Min 2X/week   Barriers to discharge        Co-evaluation               AM-PAC PT "6 Clicks" Mobility  Outcome Measure Help needed turning from your back to your side while in a flat bed without using bedrails?: A Little Help needed moving from lying on your back to sitting on the side of a flat bed without using bedrails?: A Little Help needed moving to and from a bed to a chair (including a wheelchair)?: A Little Help needed standing up from a chair using your arms (e.g., wheelchair or bedside chair)?: A Little Help needed to walk in hospital room?: A Little Help needed climbing 3-5 steps with a railing? : A Lot 6 Click Score: 17    End of Session   Activity Tolerance: Patient tolerated treatment well Patient left: in bed;with family/visitor present Nurse Communication: Mobility status PT Visit Diagnosis: Unsteadiness on feet (R26.81);Muscle weakness (generalized) (M62.81)    Time: 0350-0938 PT Time Calculation (min) (ACUTE ONLY): 12 min   Charges:   PT Evaluation $PT Re-evaluation: 1 Re-eval PT Treatments $Therapeutic Activity: 8-22 mins        Lavone Nian, PT, DPT 03/11/20, 10:28 AM   Waunita Schooner 03/11/2020, 10:27 AM

## 2020-03-11 NOTE — Progress Notes (Signed)
[facility] [facility] [place] NAME: Margaret Black    MR#:  [id number]  DATE OF BIRTH:  [birth year]  SUBJECTIVE:  POD # 4 appears better than yday On  Low intermittent suction  ON TPN with Insulin ambulated with PT  REVIEW OF SYSTEMS:   Review of Systems  Constitutional: Negative for chills, fever and weight loss.  HENT: Negative for ear discharge, ear pain and nosebleeds.   Eyes: Negative for blurred vision, pain and discharge.  Respiratory: Negative for sputum production, shortness of breath, wheezing and stridor.   Cardiovascular: Negative for chest pain, palpitations, orthopnea and PND.  Gastrointestinal: Positive for nausea. Negative for abdominal pain, diarrhea and vomiting.  Genitourinary: Negative for frequency and urgency.  Musculoskeletal: Negative for back pain and joint pain.  Neurological: Negative for sensory change, speech change, focal weakness and weakness.  Psychiatric/Behavioral: Negative for depression and hallucinations. The patient is not nervous/anxious.    Tolerating Diet:ice chips Tolerating PT: ambulated by herself earlier today. DRUG ALLERGIES:   Allergies  Allergen Reactions  . Lactase Diarrhea  . Atenolol Rash  . Latex Rash    VITALS:  Blood pressure (!) 146/55, pulse 78, temperature 97.9 F (36.6 C), temperature source Oral, resp. rate 18, height 5\' 5"  (1.651 m), weight 57 kg, SpO2 100 %.  PHYSICAL EXAMINATION:   Physical Exam  GENERAL:  79 y.o.-year-old patient lying in the bed with no acute distress.  HEENT: Head atraumatic, normocephalic. Oropharynx and nasopharynx clear.  NG+ LUNGS: Normal breath sounds bilaterally, no wheezing, rales, rhonchi. No use of accessory muscles of respiration.  CARDIOVASCULAR: S1, S2 normal. No murmurs, rubs, or gallops.  ABDOMEN: Soft, + tender, +distended. Surgical dressing present. Left lower  colostomy+ EXTREMITIES: No cyanosis, clubbing or edema b/l.   PICC+ NEUROLOGIC:  Cranial nerves II through XII are intact. No focal Motor or sensory deficits b/l.   PSYCHIATRIC:  patient is alert and oriented x 3.  SKIN: No obvious rash, lesion, or ulcer.   LABORATORY PANEL:  CBC Recent Labs  Lab 03/11/20 0538  WBC 12.9*  HGB 8.1*  HCT 24.5*  PLT 499*    Chemistries  Recent Labs  Lab 03/08/20 0617 03/09/20 0409 03/10/20 0416 03/11/20 1018  NA 133* 139 140  --   K 5.1 4.0 3.7  --   CL 104 107 107  --   CO2 22 24 24   --   GLUCOSE 363* 181* 206*  --   BUN 34* 34* 32*  --   CREATININE 1.59* 1.61* 1.57* 1.50*  CALCIUM 8.2* 8.6* 8.4*  --   MG 1.7 2.2  --   --   AST 27  --   --   --   ALT 29  --   --   --   ALKPHOS 201*  --   --   --   BILITOT 0.8  --   --   --    Cardiac Enzymes No results for input(s): TROPONINI in the last 168 hours. RADIOLOGY:  No results found. ASSESSMENT AND PLAN:   Margaret Black is a 79 y.o. female with medical history significant for significant forGERD, COPD, lung cancer status post evaluation for radiation therapy, hypothyroidism, SLE and stage IV chronic kidney disease who presented to the hospital for an outpatient upper and lower endoscopy after evaluation for unintentional weight loss and poor appetite.  Admission was requested because patient complained of worsening lower abdominal pain for 2 days.  Acute recurrent diverticulitis   --CT scan repeated on 03/06/20--  showing developing abscess and small bowel obstruction.   -Case discussed with radiologist and IR does not believe they have a window to do a drainage.   -currently on IV Zosyn   -Patient has a PICC line and TPN for nutrition.   -POD # 4 s/p colectomy and colostomy  By Dr. Windell Moment.  -foley removed --12/18-- severe nausea. Low intermittent suction place. 600 mL of bile drained immediately. Giving PRN Phenergan alternating with Zofran. -- No colostomy output. Continue ice chips. ==12/19--cont TPN and low suction per surgery. Colostomy bag some  fluid  Acute kidney injury on chronic kidney disease stage IIIb.  -- Creatinine 2.07 on presentation.  Creatinine 1.43--1.3  -- Continue IV fluid hydration along with TPN  --creat 1.5 (12/19)  Essential hypertension/ hyperaldosteronism  -on Bystolic, nifedipine and low-dose Aldactone  Hypothyroidism unspecified on Synthroid  GERD on Protonix  Anemia of chronic disease - hgb stable  Severe protein calorie malnutrition and weight loss.   -On TPN  Hyperglycemia suspected due to TPN -- continue IV insulin via TPN. Appreciate diabetes coordinator input.   Procedures: Colectomy with colostomy Family communication :dter in the room Consults :GI, surgery CODE STATUS: DNR DVT Prophylaxis :lovenox  Status is: Inpatient  Remains inpatient appropriate because:Inpatient level of care appropriate due to severity of illness   Dispo: The patient is from: home              Anticipated d/c is to: Home              Anticipated d/c date is: 3-4 days               Patient currently is not medically stable to d/c. patient is postop day 3 with colectomy with colostomy. Still NPO.  Significant nausea today. Slow improvement. Placed back on NG suction.   TOTAL TIME TAKING CARE OF THIS PATIENT: 25 minutes.  >50% time spent on counselling and coordination of care  Note: This dictation was prepared with Dragon dictation along with smaller phrase technology. Any transcriptional errors that result from this process are unintentional.  Fritzi Mandes M.D    [facility]   CC: Primary care physician; Olin Hauser, DOPatient ID: Margaret Black, female   DOB: Sep 07, 1940, 79 y.o.   MRN: [id number]

## 2020-03-12 ENCOUNTER — Telehealth: Payer: Self-pay

## 2020-03-12 LAB — TRIGLYCERIDES: Triglycerides: 348 mg/dL — ABNORMAL HIGH (ref <150)

## 2020-03-12 LAB — CBC WITH DIFFERENTIAL/PLATELET
Abs Immature Granulocytes: 0.32 10*3/uL — ABNORMAL HIGH (ref 0.00–0.07)
Basophils Absolute: 0.1 10*3/uL (ref 0.0–0.1)
Basophils Relative: 1 %
Eosinophils Absolute: 0.5 10*3/uL (ref 0.0–0.5)
Eosinophils Relative: 4 %
HCT: 23.4 % — ABNORMAL LOW (ref 36.0–46.0)
Hemoglobin: 7.6 g/dL — ABNORMAL LOW (ref 12.0–15.0)
Immature Granulocytes: 3 %
Lymphocytes Relative: 5 %
Lymphs Abs: 0.6 10*3/uL — ABNORMAL LOW (ref 0.7–4.0)
MCH: 31.7 pg (ref 26.0–34.0)
MCHC: 32.5 g/dL (ref 30.0–36.0)
MCV: 97.5 fL (ref 80.0–100.0)
Monocytes Absolute: 0.6 10*3/uL (ref 0.1–1.0)
Monocytes Relative: 5 %
Neutro Abs: 9.2 10*3/uL — ABNORMAL HIGH (ref 1.7–7.7)
Neutrophils Relative %: 82 %
Platelets: 505 10*3/uL — ABNORMAL HIGH (ref 150–400)
RBC: 2.4 MIL/uL — ABNORMAL LOW (ref 3.87–5.11)
RDW: 14.6 % (ref 11.5–15.5)
WBC: 11.1 10*3/uL — ABNORMAL HIGH (ref 4.0–10.5)
nRBC: 0 % (ref 0.0–0.2)

## 2020-03-12 LAB — COMPREHENSIVE METABOLIC PANEL
ALT: 29 U/L (ref 0–44)
ALT: 28 U/L (ref 0–44)
AST: 23 U/L (ref 15–41)
AST: 20 U/L (ref 15–41)
Albumin: 1.8 g/dL — ABNORMAL LOW (ref 3.5–5.0)
Albumin: 1.8 g/dL — ABNORMAL LOW (ref 3.5–5.0)
Alkaline Phosphatase: 182 U/L — ABNORMAL HIGH (ref 38–126)
Alkaline Phosphatase: 159 U/L — ABNORMAL HIGH (ref 38–126)
Anion gap: 9 (ref 5–15)
Anion gap: 7 (ref 5–15)
BUN: 30 mg/dL — ABNORMAL HIGH (ref 8–23)
BUN: 29 mg/dL — ABNORMAL HIGH (ref 8–23)
CO2: 25 mmol/L (ref 22–32)
CO2: 26 mmol/L (ref 22–32)
Calcium: 8.4 mg/dL — ABNORMAL LOW (ref 8.9–10.3)
Calcium: 8.5 mg/dL — ABNORMAL LOW (ref 8.9–10.3)
Chloride: 104 mmol/L (ref 98–111)
Chloride: 109 mmol/L (ref 98–111)
Creatinine, Ser: 1.44 mg/dL — ABNORMAL HIGH (ref 0.44–1.00)
Creatinine, Ser: 1.34 mg/dL — ABNORMAL HIGH (ref 0.44–1.00)
GFR, Estimated: 37 mL/min — ABNORMAL LOW (ref >60)
GFR, Estimated: 40 mL/min — ABNORMAL LOW (ref >60)
Glucose, Bld: 234 mg/dL — ABNORMAL HIGH (ref 70–99)
Glucose, Bld: 180 mg/dL — ABNORMAL HIGH (ref 70–99)
Potassium: 3.6 mmol/L (ref 3.5–5.1)
Potassium: 3.6 mmol/L (ref 3.5–5.1)
Sodium: 138 mmol/L (ref 135–145)
Sodium: 142 mmol/L (ref 135–145)
Total Bilirubin: 0.8 mg/dL (ref 0.3–1.2)
Total Bilirubin: 0.8 mg/dL (ref 0.3–1.2)
Total Protein: 5.4 g/dL — ABNORMAL LOW (ref 6.5–8.1)
Total Protein: 5.4 g/dL — ABNORMAL LOW (ref 6.5–8.1)

## 2020-03-12 LAB — GLUCOSE, CAPILLARY
Glucose-Capillary: 251 mg/dL — ABNORMAL HIGH (ref 70–99)
Glucose-Capillary: 124 mg/dL — ABNORMAL HIGH (ref 70–99)
Glucose-Capillary: 238 mg/dL — ABNORMAL HIGH (ref 70–99)
Glucose-Capillary: 184 mg/dL — ABNORMAL HIGH (ref 70–99)

## 2020-03-12 LAB — PHOSPHORUS: Phosphorus: 2.5 mg/dL (ref 2.5–4.6)

## 2020-03-12 LAB — PREALBUMIN: Prealbumin: 18.1 mg/dL (ref 18–38)

## 2020-03-12 LAB — MAGNESIUM: Magnesium: 1.8 mg/dL (ref 1.7–2.4)

## 2020-03-12 MED ORDER — MAGNESIUM SULFATE 2 GM/50ML IV SOLN
2.0000 g | Freq: Once | INTRAVENOUS | Status: AC
  Administered 2020-03-12: 2 g via INTRAVENOUS
  Filled 2020-03-12: qty 50

## 2020-03-12 MED ORDER — TRAVASOL 10 % IV SOLN
INTRAVENOUS | Status: AC
  Filled 2020-03-12: qty 858

## 2020-03-12 MED ORDER — INSULIN ASPART 100 UNIT/ML ~~LOC~~ SOLN
0.0000 [IU] | Freq: Four times a day (QID) | SUBCUTANEOUS | Status: DC
  Administered 2020-03-12: 3 [IU] via SUBCUTANEOUS
  Administered 2020-03-12 – 2020-03-13 (×2): 2 [IU] via SUBCUTANEOUS
  Administered 2020-03-14: 3 [IU] via SUBCUTANEOUS
  Administered 2020-03-14 (×2): 2 [IU] via SUBCUTANEOUS
  Administered 2020-03-14 – 2020-03-15 (×2): 3 [IU] via SUBCUTANEOUS
  Administered 2020-03-16 – 2020-03-17 (×3): 2 [IU] via SUBCUTANEOUS
  Administered 2020-03-18: 1 [IU] via SUBCUTANEOUS
  Filled 2020-03-12 (×13): qty 1

## 2020-03-12 MED ORDER — METHOCARBAMOL 1000 MG/10ML IJ SOLN
500.0000 mg | Freq: Once | INTRAVENOUS | Status: DC
  Filled 2020-03-12 (×2): qty 5

## 2020-03-12 NOTE — Consult Note (Signed)
[place] Nurse ostomy follow up Stoma type/location: LUQ, end colostomy Stomal assessment/size: 1 1/4" budded, slightly, pink, moist-examined through pouch Peristomal assessment: NA Treatment options for stomal/peristomal skin: using 2" skin barrier Output none Ostomy pouching: 2pc. 2 3/4" with 2" skin barrier ring Education provided: patient is very irritable and in pain, will attempt pouch change tomorrow with daughter, she has decided to stay for a while  Enrolled patient in [place] [facility] Discharge program: Yes  Extra supplies in the room [place], [place], [place]

## 2020-03-12 NOTE — Progress Notes (Signed)
Occupational Therapy Treatment Patient Details Name: Margaret Black MRN: [id number] DOB: 09/04/1940 Today's Date: 03/12/2020    History of present illness Pt is a 79 y/o F who presented to the hospital on 03/01/20 for endoscopy 2/2 unintentional weight loss & poor appetite. CT scan of abdomen and pelvis done without contrast showed persistent evidence of diverticulitis. CT on 03/06/20 revealed developing abcess & SBO. Pt underwent surgery on 03/07/20 for partial colectomy & colostomy. PMH: COPD, lung CA, SLE, Stage IV CKD, anxiety, L eye visual impairments, osteopenia, uterine CA, thyroid CA   OT comments  Ms. Sanangelo continues to present to OT with generalized weakness and low activity tolerance that impacts her ability to safely and independently engage in functional tasks.  She politely declined OOB mobility this date, reporting fatigue and having recently completed ambulation.  Pt was eager to participate in therapeutic exercises using yellow theraband given to her in previous session.  OTR provided supervision and education as needed on proper form, pacing, and technique to complete UB exercises at bedlevel to improve functional strength and endurance.  Pt completed bicep curls, "pull aparts", and diagonals/shoulder flexion bilaterally, x15 reps each.  Pt limited by previous R rotator cuff injury but able to complete exercises with modifications from OTR.  Pt demonstrated good recall of exercises taught in previous sessions.  Ms. Scheuring will continue to benefit from skilled OT services in acute setting to address functional strengthening and activity tolerance.  HHOT with intermittent supervision remains most appropriate discharge recommendation.      Follow Up Recommendations  Supervision - Intermittent;Home health OT    Equipment Recommendations  3 in 1 bedside commode    Recommendations for Other Services      Precautions / Restrictions Precautions Precautions: Fall Restrictions Weight  Bearing Restrictions: No       Mobility Bed Mobility Overal bed mobility: Needs Assistance                Transfers                 General transfer comment: pt declined OOB mobility this session    Balance                                           ADL either performed or assessed with clinical judgement   ADL Overall ADL's : Needs assistance/impaired Eating/Feeding: Set up;Sitting   Grooming: Set up;Sitting           Upper Body Dressing : Minimal assistance;Sitting                     General ADL Comments: Pt politely declined OOB mobility 2/2 fatigue, demonstrated ability to complete seated UB ADLs with intermittent min assist (pt limited by previous R shoulder cuff injury)     Vision Patient Visual Report: No change from baseline     Perception     Praxis      Cognition Arousal/Alertness: Awake/alert Behavior During Therapy: WFL for tasks assessed/performed Overall Cognitive Status: Within Functional Limits for tasks assessed                                 General Comments: pt pleasant and engaged throughout session        Exercises Other Exercises Other Exercises: provided supervision and education  re: UB ther-ex using yellow theraband (bicep curls, pull outs, diagonals), all performed x15 bilaterally   Shoulder Instructions       General Comments      Pertinent Vitals/ Pain       Pain Assessment: No/denies pain Pain Location: pt denies pain but reports fatigue limiting her activity Pain Intervention(s): Limited activity within patient's tolerance;Monitored during session  Home Living                                          Prior Functioning/Environment              Frequency  Min 1X/week        Progress Toward Goals  OT Goals(current goals can now be found in the care plan section)  Progress towards OT goals: Progressing toward goals  Acute Rehab OT  Goals Patient Stated Goal: get better OT Goal Formulation: With patient/family Time For Goal Achievement: 03/17/20 Potential to Achieve Goals: Good  Plan Discharge plan remains appropriate;Frequency remains appropriate    Co-evaluation                 AM-PAC OT "6 Clicks" Daily Activity     Outcome Measure   Help from another person eating meals?: None Help from another person taking care of personal grooming?: A Little Help from another person toileting, which includes using toliet, bedpan, or urinal?: A Little Help from another person bathing (including washing, rinsing, drying)?: A Little Help from another person to put on and taking off regular upper body clothing?: A Little Help from another person to put on and taking off regular lower body clothing?: A Little 6 Click Score: 19    End of Session Equipment Utilized During Treatment: Other (comment) (yellow theraband)  OT Visit Diagnosis: Other abnormalities of gait and mobility (R26.89);Muscle weakness (generalized) (M62.81)   Activity Tolerance Patient limited by fatigue   Patient Left in chair;with call bell/phone within reach;with family/visitor present;with bed alarm set   Nurse Communication          Time: 6063-0160 OT Time Calculation (min): 15 min  Charges: OT General Charges $OT Visit: 1 Visit OT Treatments $Therapeutic Exercise: 8-22 mins  Kathyrn Drown Izaiha Lo, OTR/L 03/12/20, 1:18 PM

## 2020-03-12 NOTE — Progress Notes (Signed)
PHARMACY - TOTAL PARENTERAL NUTRITION CONSULT NOTE   Indication: severe diverticulitis  Patient Measurements: Height: 5\' 5"  (165.1 cm) Weight: 57 kg (125 lb 10.6 oz) IBW/kg (Calculated) : 57 TPN AdjBW (KG): 52.2 Body mass index is 20.91 kg/m.   Assessment:  recurrent diverticulitis,  per surgery note: this will be her third trial of antibiotics, second hospitalization. typical recommendation is to proceed with surgical resection of the colon.  S/P colectomy and end colostomy 12/15.   Glucose / Insulin: BG 116 - 234  SSI/24hr= 0 units required Electrolytes: WNL Renal: Scr 2.07 -->1.34-->1.61-->1.44 LFTs / TGs: LFTs WNL   TG 123-->150 Prealbumin / albumin:   Alb 2.2-->1.7  preAlb 6.4-->7.2 Intake / Output; MIVF: 2878 / 6767 No MIVF GI Imaging: no recent Surgeries / Procedures:  12/15 partial colectomy and end colostomy Central access: PICC 03/03/20 TPN start date: 03/03/20  Nutritional Goals (per RD recommendation on 03/05/20): kCal: 1565-1830, Protein: 80-95 g, Fluid: >/= 1.3 L   Current Nutrition:  NPO   Plan:   continue TPN rate at 65 ml/hr (total volume 1660 mL)  Nutritional Components: ? Protein: 55 G/L- Clinisol 10% (85.8 g/day)  ? Dextrose: 21 % (327.6 g/day) ? Lipids: 18 g/L (28 g/day)   ? kCal: 1737/day  Electrolytes in TPN: 64mEq/L of Na, 30 mEq/L of K, 15mEq/L of Ca, 44mEq/L of Mg, and 25mmol/L of Phos. Cl:Ac ratio 1:1  Add back 10 units insulin to TPN  2 grams IV magnesium sulfate x 1  Add standard MVI and trace elements to TPN daily  adjust SSI to sensitive and continue at every 6 hours intervals  Monitor TPN labs on Mon/Thurs   [name] 03/12/2020,7:07 AM

## 2020-03-12 NOTE — Care Management Important Message (Signed)
Important Message  Patient Details  Name: Margaret Black MRN: [id number] Date of Birth: 06/23/1940   Medicare Important Message Given:  Yes     Dannette Barbara 03/12/2020, 11:03 AM

## 2020-03-12 NOTE — Progress Notes (Signed)
Nutrition Follow Up Note   DOCUMENTATION CODES:   Severe malnutrition in context of chronic illness  INTERVENTION:   Continue TPN per pharmacy.  Daily weights   NUTRITION DIAGNOSIS:   Severe Malnutrition related to chronic illness (COPD, CKD, inadequate oral intake related to diverticulitis since 12/2019) as evidenced by energy intake < or equal to 75% for > or equal to 1 month, 13.4% weight loss over the past 4 months,severe fat depletion,moderate muscle depletion,severe muscle depletion. Ongoing.  GOAL:   Patient will meet greater than or equal to 90% of their needs Met with TPN.  MONITOR:   Diet advancement,Labs,Weight trends,I & O's, TPN  ASSESSMENT:   79 year old female with medical history significant for GERD, HLD, HTN, collagen vascular disease, COPD, lung cancer s/p lobectomy 2001 (recently s/p XRT 11/2019),uternie cancer s/p hysterectomy 2001, osteopenia, hyperaldosteronism, Lupus, IBS, retinal vein occulusion of left eye,  hypothyroidism, SLE, CKD stage IV and grade II diastolic dysfunction who is admitted with acute diverticulitis now s/p Hartmann's 12/15   Pt tolerating TPN well at goal rate. Refeed labs stable. Insulin being added to TPN. Pt with post-op ileus. Pt NPO with NGT in place to LIS with 734m output in 24 hrs. Pt with small amount of stool in her ostomy today. Per chart, pt is weight stable since admit. RD will monitor for diet advancement.   Medications reviewed and include: lovenox, insulin, synthroid, protonix, aldactone, Mg sulfate, zosyn  Labs reviewed: K 3.6 wnl, BUN 29(H), creat 1.34(H), P 2.5 wnl Triglycerides- 348(H)- 12/20 Wbc- 11.1(H), Hgb 7.6(L), Hct 23.4(L) cbgs- 251, 124 x 24 hrs  Diet Order:    Diet Order            Diet NPO time specified Except for: Sips with Meds  Diet effective now                EDUCATION NEEDS:   No education needs have been identified at this time  Skin:  Skin Assessment: Reviewed RN Assessment  Last  BM:  12/20- small amount via ostomy  Height:   Ht Readings from Last 1 Encounters:  03/01/20 5' 5"  (1.651 m)   Weight:   Wt Readings from Last 1 Encounters:  03/12/20 57 kg   Ideal Body Weight:  56.8 kg  BMI:  Body mass index is 20.91 kg/m.  Estimated Nutritional Needs:   Kcal:  [address] Protein:  80-95 grams  Fluid:  >/= 1.3 L  CKoleen DistanceMS, RD, LDN Please refer to [facility] Centerfor RD and/or RD on-call/weekend/after hours pager

## 2020-03-12 NOTE — Progress Notes (Addendum)
[facility] NAME: Greydis Stlouis    MR#:  [id number]  DATE OF BIRTH:  [birth year]  SUBJECTIVE:  POD # 5 appears better todat On  Low intermittent suction out put 700 cc /24 hrs --decreasing some ON TPN with Insulin ambulated with PT  REVIEW OF SYSTEMS:   Review of Systems  Constitutional: Negative for chills, fever and weight loss.  HENT: Negative for ear discharge, ear pain and nosebleeds.   Eyes: Negative for blurred vision, pain and discharge.  Respiratory: Negative for sputum production, shortness of breath, wheezing and stridor.   Cardiovascular: Negative for chest pain, palpitations, orthopnea and PND.  Gastrointestinal: Positive for nausea. Negative for abdominal pain, diarrhea and vomiting.  Genitourinary: Negative for frequency and urgency.  Musculoskeletal: Negative for back pain and joint pain.  Neurological: Negative for sensory change, speech change, focal weakness and weakness.  Psychiatric/Behavioral: Negative for depression and hallucinations. The patient is not nervous/anxious.    Tolerating Diet:ice chips Tolerating PT: ambulated by herself earlier today. DRUG ALLERGIES:   Allergies  Allergen Reactions  . Lactase Diarrhea  . Atenolol Rash  . Latex Rash    VITALS:  Blood pressure (!) 145/64, pulse 83, temperature (!) 97.4 F (36.3 C), temperature source Oral, resp. rate 16, height 5\' 5"  (1.651 m), weight 57 kg, SpO2 100 %.  PHYSICAL EXAMINATION:   Physical Exam  GENERAL:  79 y.o.-year-old patient lying in the bed with no acute distress.  HEENT: Head atraumatic, normocephalic. Oropharynx and nasopharynx clear.  NG+ LUNGS: Normal breath sounds bilaterally, no wheezing, rales, rhonchi. No use of accessory muscles of respiration.  CARDIOVASCULAR: S1, S2 normal. No murmurs, rubs, or gallops.  ABDOMEN: Soft, + tender, +distended. Surgical dressing present. Left lower  colostomy+ EXTREMITIES: No cyanosis,  clubbing or edema b/l.   PICC+ NEUROLOGIC: Cranial nerves II through XII are intact. No focal Motor or sensory deficits b/l.   PSYCHIATRIC:  patient is alert and oriented x 3.  SKIN: No obvious rash, lesion, or ulcer.   LABORATORY PANEL:  CBC Recent Labs  Lab 03/12/20 1029  WBC 11.1*  HGB 7.6*  HCT 23.4*  PLT 505*    Chemistries  Recent Labs  Lab 03/12/20 0150 03/12/20 1029  NA 138 142  K 3.6 3.6  CL 104 109  CO2 25 26  GLUCOSE 234* 180*  BUN 30* 29*  CREATININE 1.44* 1.34*  CALCIUM 8.4* 8.5*  MG 1.8  --   AST 23 20  ALT 29 28  ALKPHOS 182* 159*  BILITOT 0.8 0.8   Cardiac Enzymes No results for input(s): TROPONINI in the last 168 hours. RADIOLOGY:  No results found. ASSESSMENT AND PLAN:   REIGN DZIUBA is a 79 y.o. female with medical history significant for significant forGERD, COPD, lung cancer status post evaluation for radiation therapy, hypothyroidism, SLE and stage IV chronic kidney disease who presented to the hospital for an outpatient upper and lower endoscopy after evaluation for unintentional weight loss and poor appetite.  Admission was requested because patient complained of worsening lower abdominal pain for 2 days.   Acute recurrent diverticulitis   Post Operative Ileus --CT scan repeated on 03/06/20--  showing developing abscess and small bowel obstruction.   -Case discussed with radiologist and IR does not believe they have a window to do a drainage.   -currently on IV Zosyn   -Patient has a PICC line and TPN for nutrition.   -POD #  5s/p  colectomy and colostomy  By Dr. Windell Moment.  -foley removed --12/18-- severe nausea. Low intermittent suction place. 600 mL of bile drained immediately. Giving PRN Phenergan alternating with Zofran. -- No colostomy output. Continue ice chips. --12/19--cont TPN and low suction per surgery. Colostomy bag some fluid -- 12/20-- no new issues. Slow improvement. Follow surgery recommendation.  Acute kidney injury  on chronic kidney disease stage IIIb.  -- Creatinine 2.07 on presentation.  Creatinine 1.43--1.3  -- Continue IV fluid hydration along with TPN  --creat 1.5 (12/19)  Essential hypertension/ hyperaldosteronism  --on Bystolic, nifedipine and low-dose Aldactone  Hypothyroidism  --unspecified on Synthroid  GERD  --on Protonix  Anemia of chronic disease - hgb stable  Severe protein calorie malnutrition and weight loss.   -On TPN  Hyperglycemia suspected due to TPN -- continue IV insulin via TPN. Appreciate diabetes coordinator input.   Procedures: Colectomy with colostomy Family communication :dter in the room Consults :GI, surgery CODE STATUS: DNR DVT Prophylaxis :lovenox  Status is: Inpatient  Remains inpatient appropriate because:Inpatient level of care appropriate due to severity of illness   Dispo: The patient is from: home              Anticipated d/c is to: Home              Anticipated d/c date is:TBD--slow improvment              Patient currently is not medically stable to d/c. patient is s/p colectomy with colostomy. Still NPO.   Slow improvement. Placed back on NG suction. Discharge disposition per surgery recommendation.   TOTAL TIME TAKING CARE OF THIS PATIENT: 20 minutes.  >50% time spent on counselling and coordination of care  Note: This dictation was prepared with Dragon dictation along with smaller phrase technology. Any transcriptional errors that result from this process are unintentional.  Fritzi Mandes M.D    [facility]   CC: Primary care physician; Olin Hauser, DOPatient ID: [name], female   DOB: 05-19-1940, 79 y.o.   MRN: [id number]

## 2020-03-12 NOTE — Chronic Care Management (AMB) (Signed)
Care Management   Note  03/12/2020 Name: Margaret Black MRN: [id number] DOB: 15-Apr-1940  Margaret Black is a 79 y.o. year old female who is a primary care patient of Smitty Cords, DO and is actively engaged with the care management team. I reached out to Margaret Black by phone today to assist with re-scheduling a follow up visit with the Licensed Clinical Social Worker  Follow up plan: Unsuccessful telephone outreach attempt made. Patient Husband states that she is in the hospital and unsure when she will be discharged. Will call back to reschedule when patient is doing better.   Penne Lash, RMA Care Guide, Embedded Care Coordination [facility]  [place], [place] [address] Direct Dial: [phone] [email or web address]

## 2020-03-12 NOTE — Progress Notes (Signed)
Patient had x 1 episode of leg twitching. Unknown to this RN, patient had taken a home dose of Ativan (from her purse) along with the scheduled Ativan I gave her at bedtime and a hour or so later had a dose of Phenergan which is the probable cause of her involuntary leg twitching. Hospitalist made aware/Robaxin IV ordered. Patient's legs have now stopped twitching, and she wants to hold off on taking anything else at all. She did not have anymore medications in her purse. She verbalized her understanding to never do this again due to how dangerous it is to mix medications and increase dosages.

## 2020-03-12 NOTE — Progress Notes (Signed)
PT Cancellation Note  Patient Details Name: Margaret Black MRN: [id number] DOB: [birth year]   Cancelled Treatment:    Reason Eval/Treat Not Completed: Other (comment) Attempted to see pt. Pt received on [facility] with daughter assisting. Pt voicing frustration re: situation & declines participation in tx. PT provides encouragement & offers chaplain services - contacted case manager to assist with coordinating this as pt reports "that would be good". Will re-attempt as able.   Lavone Nian, PT, DPT 03/12/20, 4:04 PM    Waunita Schooner 03/12/2020, 4:03 PM

## 2020-03-12 NOTE — Progress Notes (Signed)
[facility] Hospital Day(s): 11.   Post op day(s): 5 Days Post-Op.   Interval History: Patient seen and examined, no acute events or new complaints overnight. Patient reports feeling the same.  She reported that she is not progressing.  She denies any significant pain.  She denies any fever or chills.  There has been no bowel function.  Vital signs in last 24 hours: [min-max] current  Temp:  [97.4 F (36.3 C)-98.2 F (36.8 C)] 97.4 F (36.3 C) (12/20 1117) Pulse Rate:  [78-83] 83 (12/20 1117) Resp:  [16-20] 16 (12/20 1117) BP: (134-145)/(48-64) 145/64 (12/20 1117) SpO2:  [98 %-100 %] 100 % (12/20 1117) Weight:  [57 kg] 57 kg (12/20 0500)     Height: 5\' 5"  (165.1 cm) Weight: 57 kg BMI (Calculated): 20.91   Physical Exam:  Constitutional: alert, cooperative and no distress  Respiratory: breathing non-labored at rest  Cardiovascular: regular rate and sinus rhythm  Gastrointestinal: soft, non-tender, and distended. Wound is dry and clean. Ostomy pink and patent.   Labs:  CBC Latest Ref Rng & Units 03/12/2020 03/11/2020 03/08/2020  WBC 4.0 - 10.5 K/uL 11.1(H) 12.9(H) 15.4(H)  Hemoglobin 12.0 - 15.0 g/dL 7.6(L) 8.1(L) 9.7(L)  Hematocrit 36.0 - 46.0 % 23.4(L) 24.5(L) 30.5(L)  Platelets 150 - 400 K/uL 505(H) 499(H) 414(H)   CMP Latest Ref Rng & Units 03/12/2020 03/12/2020 03/11/2020  Glucose 70 - 99 mg/dL 180(H) 234(H) -  BUN 8 - 23 mg/dL 29(H) 30(H) -  Creatinine 0.44 - 1.00 mg/dL 1.34(H) 1.44(H) 1.50(H)  Sodium 135 - 145 mmol/L 142 138 -  Potassium 3.5 - 5.1 mmol/L 3.6 3.6 -  Chloride 98 - 111 mmol/L 109 104 -  CO2 22 - 32 mmol/L 26 25 -  Calcium 8.9 - 10.3 mg/dL 8.5(L) 8.4(L) -  Total Protein 6.5 - 8.1 g/dL 5.4(L) 5.4(L) -  Total Bilirubin 0.3 - 1.2 mg/dL 0.8 0.8 -  Alkaline Phos 38 - 126 U/L 159(H) 182(H) -  AST 15 - 41 U/L 20 23 -  ALT 0 - 44 U/L 28 29 -    Imaging studies: No new pertinent imaging studies   Assessment/Plan:  79 y.o.femalewith acute  diverticulitis with abscess and perforation5Day Post-Ops/p partial colectomy and end colostomy, complicated by pertinent comorbidities includinganxiety disorder, chronic kidney disease, COPD, hypertension, hypothyroidism, irritable bowel syndrome, history of lung cancer, lupus.  Patient without any clinical deterioration.  Patient continue with postoperative ileus.  There is no sign of bowel function.  There has been no significant pain.  White blood cell continuing decreasing trend.  Platelets continue decreasing trend.  If there is no change on clinical status tomorrow I will order CT scan of the abdomen and pelvis with IV and oral contrast to rule out causes of the ileus such as intra-abdominal abscess.  We will continue with NGT to low intermittent suction.  We will continue with TPN.  We will continue with IV antibiotic therapy.  Patient ambulating.  We will continue with DVT prophylaxis.  Appreciate hospitalist management of medical comorbidities.  Arnold Long, MD

## 2020-03-13 ENCOUNTER — Encounter: Payer: Self-pay | Admitting: Internal Medicine

## 2020-03-13 ENCOUNTER — Inpatient Hospital Stay: Payer: Medicare Other

## 2020-03-13 DIAGNOSIS — E039 Hypothyroidism, unspecified: Secondary | ICD-10-CM | POA: Diagnosis not present

## 2020-03-13 DIAGNOSIS — K5720 Diverticulitis of large intestine with perforation and abscess without bleeding: Secondary | ICD-10-CM | POA: Diagnosis not present

## 2020-03-13 DIAGNOSIS — E43 Unspecified severe protein-calorie malnutrition: Secondary | ICD-10-CM | POA: Diagnosis not present

## 2020-03-13 LAB — ANAEROBIC CULTURE

## 2020-03-13 LAB — GLUCOSE, CAPILLARY
Glucose-Capillary: 186 mg/dL — ABNORMAL HIGH (ref 70–99)
Glucose-Capillary: 147 mg/dL — ABNORMAL HIGH (ref 70–99)
Glucose-Capillary: 175 mg/dL — ABNORMAL HIGH (ref 70–99)

## 2020-03-13 LAB — SURGICAL PATHOLOGY

## 2020-03-13 MED ORDER — TRAVASOL 10 % IV SOLN
INTRAVENOUS | Status: AC
  Filled 2020-03-13: qty 858

## 2020-03-13 MED ORDER — IOHEXOL 9 MG/ML PO SOLN
500.0000 mL | ORAL | Status: AC
  Administered 2020-03-13 (×2): 500 mL via ORAL

## 2020-03-13 MED ORDER — IOHEXOL 300 MG/ML  SOLN
60.0000 mL | Freq: Once | INTRAMUSCULAR | Status: AC | PRN
  Administered 2020-03-13: 60 mL via INTRAVENOUS

## 2020-03-13 NOTE — Progress Notes (Signed)
Physical Therapy Treatment Patient Details Name: Margaret Black MRN: [id number] DOB: 05/21/1940 Today's Date: 03/13/2020    History of Present Illness Pt is a 79 y/o F who presented to the hospital on 03/01/20 for endoscopy 2/2 unintentional weight loss & poor appetite. CT scan of abdomen and pelvis done without contrast showed persistent evidence of diverticulitis. CT on 03/06/20 revealed developing abcess & SBO. Pt underwent surgery on 03/07/20 for partial colectomy & colostomy. PMH: COPD, lung CA, SLE, Stage IV CKD, anxiety, L eye visual impairments, osteopenia, uterine CA, thyroid CA    PT Comments    Pt ready for session this pm.  Nursing in to manage NG tube disconnect/reconnect.  Bed mobility with supervision and increased time. Stands with supervision and elects to HHA +1 with daughter managing IV pole.  Somewhat impulsive with cues to stop and wait for lines.  She does not want to use RW and is able to complete 2 laps with fatigue and some tremors from fatigue noted upon return to room.  She voices frustration over not being able to walk as far and general weakness and encouragement is given. She would benefit from a RW if she agrees to use one for overall safety, independence and balance.  Will continue to educate and encourage her to try one.   Follow Up Recommendations  Home health PT;Supervision for mobility/OOB     Equipment Recommendations  Rolling walker with 5" wheels;3in1 (PT)    Recommendations for Other Services       Precautions / Restrictions Precautions Precautions: Fall Restrictions Weight Bearing Restrictions: No    Mobility  Bed Mobility Overal bed mobility: Needs Assistance Bed Mobility: Supine to Sit;Sit to Supine     Supine to sit: Supervision Sit to supine: Supervision      Transfers Overall transfer level: Needs assistance Equipment used: None                Ambulation/Gait Ambulation/Gait assistance: Min assist Gait Distance (Feet):  320 Feet Assistive device: 1 person hand held assist Gait Pattern/deviations: Decreased stride length;Decreased step length - left;Decreased step length - right Gait velocity: decreased   General Gait Details: 2 laps with HHA and daughter managing IV pole   Stairs             Wheelchair Mobility    Modified Rankin (Stroke Patients Only)       Balance Overall balance assessment: Needs assistance Sitting-balance support: Feet supported Sitting balance-Leahy Scale: Good     Standing balance support: Single extremity supported Standing balance-Leahy Scale: Poor Standing balance comment: BUE HHA support during gait                            Cognition Arousal/Alertness: Awake/alert Behavior During Therapy: WFL for tasks assessed/performed Overall Cognitive Status: Within Functional Limits for tasks assessed                                        Exercises      General Comments        Pertinent Vitals/Pain Pain Assessment: No/denies pain Pain Location: pt denies pain but reports fatigue limiting her activity    Home Living                      Prior Function  PT Goals (current goals can now be found in the care plan section) Progress towards PT goals: Progressing toward goals    Frequency    Min 2X/week      PT Plan Current plan remains appropriate    Co-evaluation              AM-PAC PT "6 Clicks" Mobility   Outcome Measure  Help needed turning from your back to your side while in a flat bed without using bedrails?: A Little Help needed moving from lying on your back to sitting on the side of a flat bed without using bedrails?: A Little Help needed moving to and from a bed to a chair (including a wheelchair)?: A Little Help needed standing up from a chair using your arms (e.g., wheelchair or bedside chair)?: A Little Help needed to walk in hospital room?: A Little   6 Click Score: 15     End of Session Equipment Utilized During Treatment: Gait belt Activity Tolerance: Patient tolerated treatment well Patient left: in bed;with family/visitor present;with nursing/sitter in room Nurse Communication: Mobility status       Time: 1610-9604 PT Time Calculation (min) (ACUTE ONLY): 15 min  Charges:  $Gait Training: 8-22 mins                    Chesley Noon, PTA 03/13/20, 3:26 PM

## 2020-03-13 NOTE — Progress Notes (Signed)
Scanner not available due to power issue- 2200 medications given along with PRN medication for pain and nausea.

## 2020-03-13 NOTE — Progress Notes (Signed)
NG tube placed in right nare. Imaging showed tube needed to be advanced 7cm per NP, however pt refused and stated she needed time to think about it. Will continue to monitor.

## 2020-03-13 NOTE — Progress Notes (Signed)
PHARMACY - TOTAL PARENTERAL NUTRITION CONSULT NOTE   Indication: severe diverticulitis  Patient Measurements: Height: 5\' 5"  (165.1 cm) Weight: 57 kg (125 lb 10.6 oz) IBW/kg (Calculated) : 57 TPN AdjBW (KG): 52.2 Body mass index is 20.91 kg/m.   Assessment:  recurrent diverticulitis,  per surgery note: this will be her third trial of antibiotics, second hospitalization. typical recommendation is to proceed with surgical resection of the colon.  S/P colectomy and end colostomy 12/15.   Glucose / Insulin: BG 124 - 251 SSI/24hr= 15 units required Electrolytes: WNL Renal: Scr 2.07 -->1.34-->1.61-->1.44 LFTs / TGs: LFTs WNL   TG 123-->150 Prealbumin / albumin:   Alb 2.2-->1.7  preAlb 6.4-->7.2 Intake / Output; MIVF: 4818 / 5631 No MIVF GI Imaging:  12/21 CT: Prior multiloculated fluid collection/abscess in the left lower pelvis has resolved Surgeries / Procedures:  12/15 partial colectomy and end colostomy Central access: PICC 03/03/20 TPN start date: 03/03/20  Nutritional Goals (per RD recommendation on 03/05/20): kCal: 1565-1830, Protein: 80-95 g, Fluid: >/= 1.3 L   Current Nutrition:  NPO   Plan:   continue TPN rate at 65 ml/hr (total volume 1660 mL)  Nutritional Components: ? Protein: 55 G/L- Clinisol 10% (85.8 g/day)  ? Dextrose: 21 % (327.6 g/day) ? Lipids: 18 g/L (28 g/day)   ? kCal: 1737/day  Electrolytes in TPN: 76mEq/L of Na, 30 mEq/L of K, 62mEq/L of Ca, 32mEq/L of Mg, and 62mmol/L of Phos. Cl:Ac ratio 1:1  continue 10 units insulin inTPN  Add standard MVI and trace elements to TPN daily  continue sensitive SSI at every 6 hour intervals  Monitor TPN labs on Mon/Thurs   [name] 03/13/2020,7:12 AM

## 2020-03-13 NOTE — Progress Notes (Signed)
[place] [facility] [place] NAME: Margaret Black    MR#:  [id number]  DATE OF BIRTH:  May 03, 1940  SUBJECTIVE:  POD # 6 appears the same On  Low intermittent suction out put 400 cc /24 hrs --decreasing some ON TPN with Insulin ambulated with PT and her dter  REVIEW OF SYSTEMS:   Review of Systems  Constitutional: Negative for chills, fever and weight loss.  HENT: Negative for ear discharge, ear pain and nosebleeds.   Eyes: Negative for blurred vision, pain and discharge.  Respiratory: Negative for sputum production, shortness of breath, wheezing and stridor.   Cardiovascular: Negative for chest pain, palpitations, orthopnea and PND.  Gastrointestinal: Positive for nausea. Negative for abdominal pain, diarrhea and vomiting.  Genitourinary: Negative for frequency and urgency.  Musculoskeletal: Negative for back pain and joint pain.  Neurological: Negative for sensory change, speech change, focal weakness and weakness.  Psychiatric/Behavioral: Negative for depression and hallucinations. The patient is not nervous/anxious.    Tolerating Diet:ice chips Tolerating PT: HHPT DRUG ALLERGIES:   Allergies  Allergen Reactions  . Lactase Diarrhea  . Atenolol Rash  . Latex Rash    VITALS:  Blood pressure (!) 137/59, pulse 71, temperature 97.7 F (36.5 C), temperature source Oral, resp. rate 20, height 5\' 5"  (1.651 m), weight 57 kg, SpO2 100 %.  PHYSICAL EXAMINATION:   Physical Exam  GENERAL:  79 y.o.-year-old patient lying in the bed with no acute distress.  HEENT: Head atraumatic, normocephalic. Oropharynx and nasopharynx clear.  NG+ LUNGS: Normal breath sounds bilaterally, no wheezing, rales, rhonchi. No use of accessory muscles of respiration.  CARDIOVASCULAR: S1, S2 normal. No murmurs, rubs, or gallops.  ABDOMEN: Soft, + tender, +distended. Surgical dressing present.   colostomy+ EXTREMITIES: No cyanosis, clubbing or edema b/l.    PICC+ NEUROLOGIC: Cranial nerves II through XII are intact. No focal Motor or sensory deficits b/l.   PSYCHIATRIC:  patient is alert and oriented x 3.  SKIN: No obvious rash, lesion, or ulcer.   LABORATORY PANEL:  CBC Recent Labs  Lab 03/12/20 1029  WBC 11.1*  HGB 7.6*  HCT 23.4*  PLT 505*    Chemistries  Recent Labs  Lab 03/12/20 0150 03/12/20 1029  NA 138 142  K 3.6 3.6  CL 104 109  CO2 25 26  GLUCOSE 234* 180*  BUN 30* 29*  CREATININE 1.44* 1.34*  CALCIUM 8.4* 8.5*  MG 1.8  --   AST 23 20  ALT 29 28  ALKPHOS 182* 159*  BILITOT 0.8 0.8   Cardiac Enzymes No results for input(s): TROPONINI in the last 168 hours. RADIOLOGY:  DG Abd 1 View  Result Date: 03/13/2020 CLINICAL DATA:  Nasogastric tube placement EXAM: ABDOMEN - 1 VIEW COMPARISON:  Abdominal CT from 7 days ago FINDINGS: Enteric tube with tip near the GE junction and side-port over the lower esophagus. Need 7 cm of advancement to place the side port at the GE junction. Gas distended small and large bowel seen in the upper abdomen. Postoperative left lower lobe. Normal heart size IMPRESSION: Enteric tube with side port over the lower esophagus. 7 cm of advancement would be needed to place the side port near the GE junction. Electronically Signed   By: Monte Fantasia M.D.   On: 03/13/2020 04:36   CT ABDOMEN PELVIS W CONTRAST  Result Date: 03/13/2020 CLINICAL DATA:  Status post partial colectomy on 12/15 with colostomy, severe right abdominal pain EXAM: CT ABDOMEN AND  PELVIS WITH CONTRAST TECHNIQUE: Multidetector CT imaging of the abdomen and pelvis was performed using the standard protocol following bolus administration of intravenous contrast. CONTRAST:  66mL OMNIPAQUE IOHEXOL 300 MG/ML  SOLN COMPARISON:  03/06/2020 FINDINGS: Lower chest: Trace right pleural effusion. Hepatobiliary: 12 mm cyst in the posterior right hepatic lobe (series 2/image 21). Additional subcentimeter probable cysts. Status post  cholecystectomy. Mild intrahepatic and extrahepatic ductal dilatation. Dilated common duct, measuring 14 mm, although smoothly tapering at the ampulla. Pancreas: Within normal limits. Spleen: Within normal limits. Adrenals/Urinary Tract: Adrenal glands are within normal limits. Kidneys are within normal limits.  No hydronephrosis. Bladder is within normal limits. Stomach/Bowel: Enteric tube terminates at the GE junction. Stomach is within normal limits. Status post left hemicolectomy with left mid abdominal colostomy. Hartmann's pouch with distal sigmoid diverticulosis, without evidence of diverticulitis. No evidence of bowel obstruction. Appendix is not discretely visualized. Vascular/Lymphatic: No evidence of abdominal aortic aneurysm. Atherosclerotic calcifications of the abdominal aorta and branch vessels. No suspicious abdominopelvic lymphadenopathy. Reproductive: Status post hysterectomy. No adnexal masses. Other: Trace mesenteric fluid/stranding. Right mid abdominal surgical drain terminates in the left lower pelvis. Prior multiloculated fluid collection/abscess in the left lower pelvis has resolved. No free air. Musculoskeletal: Midline skin staples with postsurgical changes along the anterior abdominal wall. Visualized osseous structures are within normal limits. IMPRESSION: Status post left hemicolectomy with left mid abdominal colostomy. Prior multiloculated fluid collection/abscess in the left lower pelvis has resolved. Surgical drain terminates in the left lower pelvis. No free air. Enteric tube terminates at the GE junction. Electronically Signed   By: Julian Hy M.D.   On: 03/13/2020 11:51   ASSESSMENT AND PLAN:   Margaret Black is a 79 y.o. female with medical history significant for significant forGERD, COPD, lung cancer status post evaluation for radiation therapy, hypothyroidism, SLE and stage IV chronic kidney disease who presented to the hospital for an outpatient upper and lower  endoscopy after evaluation for unintentional weight loss and poor appetite.  Admission was requested because patient complained of worsening lower abdominal pain for 2 days.   Acute recurrent diverticulitis   Post Operative Ileus --CT scan repeated on 03/06/20--  showing developing abscess and small bowel obstruction.   -Case discussed with radiologist and IR does not believe they have a window to do a drainage.   -currently on IV Zosyn   -Patient has a PICC line and TPN for nutrition.   -POD s/p left hemi- colectomy and colostomy  By Dr. Windell Moment.  -foley removed --12/18-- severe nausea. Low intermittent suction place. 600 mL of bile drained immediately. Giving PRN Phenergan alternating with Zofran. -- No colostomy output. Continue ice chips. --12/19--cont TPN and low suction per surgery. Colostomy bag some fluid -- 12/20-- no new issues. Slow improvement. Follow surgery recommendation. --12/21-- CT abdomen with contrast shows no new abnormality. Continue current management.  Acute kidney injury on chronic kidney disease stage IIIb.  -- Creatinine 2.07 on presentation.  Baseline Creatinine 1.43--1.3  --creat 1.5 (12/19)  Essential hypertension/ hyperaldosteronism  --on Bystolic, nifedipine and low-dose Aldactone  Hypothyroidism  --unspecified on Synthroid  GERD  --on Protonix  Anemia of chronic disease - hgb stable  Severe protein calorie malnutrition and weight loss.   -Nutrition Status: Nutrition Problem: Severe Malnutrition Etiology: chronic illness (COPD, CKD, inadequate oral intake related to diverticulitis since 12/2019) Signs/Symptoms: energy intake < or equal to 75% for > or equal to 1 month,percent weight loss,severe fat depletion,moderate muscle depletion,severe muscle depletion Percent weight loss:  13.4 % Interventions: Refer to RD note for recommendations  --on TPN  Hyperglycemia suspected due to TPN -- continue IV insulin via TPN. Appreciate diabetes  coordinator input. --no h/o DM   Procedures: Colectomy with colostomy Family communication :dter in the room Consults :GI, surgery CODE STATUS: DNR DVT Prophylaxis :lovenox  Status is: Inpatient  Remains inpatient appropriate because:Inpatient level of care appropriate due to severity of illness   Dispo: The patient is from: home              Anticipated d/c is to: Home              Anticipated d/c date is:TBD--slow improvment              Patient currently is not medically stable to d/c. patient is s/p colectomy with colostomy. Still NPO.   Slow improvement. Placed back on NG suction. Discharge disposition per surgery recommendation.   TOTAL TIME TAKING CARE OF THIS PATIENT: 20 minutes.  >50% time spent on counselling and coordination of care  Note: This dictation was prepared with Dragon dictation along with smaller phrase technology. Any transcriptional errors that result from this process are unintentional.  Fritzi Mandes M.D    [facility]   CC: Primary care physician; Olin Hauser, DOPatient ID: [name], female   DOB: February 07, 1941, 79 y.o.   MRN: [id number]

## 2020-03-13 NOTE — Progress Notes (Signed)
PT Cancellation Note  Patient Details Name: Margaret Black MRN: [id number] DOB: January 21, 1941   Cancelled Treatment:    Reason Eval/Treat Not Completed: Patient at procedure or test/unavailable   Attempted session this am.  Daughter in room assisting pt off of commode.  Stated she was awaiting transport to CT and it was not a good time now.  Will return at a later time/date.   Chesley Noon 03/13/2020, 11:07 AM

## 2020-03-13 NOTE — Progress Notes (Signed)
[facility] Hospital Day(s): 12.   Post op day(s): 6 Days Post-Op.   Interval History: Patient seen and examined, no acute events or new complaints overnight. Patient reports she feels frustrated because there has been no progress.  Patient denies any nausea or vomiting.  Patient denies any gas or any sign of bowel function during the night.  Pain is controlled.  Vital signs in last 24 hours: [min-max] current  Temp:  [97.4 F (36.3 C)-98.2 F (36.8 C)] 97.7 F (36.5 C) (12/21 0355) Pulse Rate:  [74-83] 74 (12/21 0355) Resp:  [16-20] 20 (12/21 0355) BP: (122-145)/(48-64) 140/55 (12/21 0355) SpO2:  [99 %-100 %] 99 % (12/21 0355)     Height: 5\' 5"  (165.1 cm) Weight: 57 kg BMI (Calculated): 20.91   Physical Exam:  Constitutional: alert, cooperative and no distress  Respiratory: breathing non-labored at rest  Cardiovascular: regular rate and sinus rhythm  Gastrointestinal: soft, non-tender, and distended.  Colostomy is pink and patent.  Wounds dry and clean.  Labs:  CBC Latest Ref Rng & Units 03/12/2020 03/11/2020 03/08/2020  WBC 4.0 - 10.5 K/uL 11.1(H) 12.9(H) 15.4(H)  Hemoglobin 12.0 - 15.0 g/dL 7.6(L) 8.1(L) 9.7(L)  Hematocrit 36.0 - 46.0 % 23.4(L) 24.5(L) 30.5(L)  Platelets 150 - 400 K/uL 505(H) 499(H) 414(H)   CMP Latest Ref Rng & Units 03/12/2020 03/12/2020 03/11/2020  Glucose 70 - 99 mg/dL 180(H) 234(H) -  BUN 8 - 23 mg/dL 29(H) 30(H) -  Creatinine 0.44 - 1.00 mg/dL 1.34(H) 1.44(H) 1.50(H)  Sodium 135 - 145 mmol/L 142 138 -  Potassium 3.5 - 5.1 mmol/L 3.6 3.6 -  Chloride 98 - 111 mmol/L 109 104 -  CO2 22 - 32 mmol/L 26 25 -  Calcium 8.9 - 10.3 mg/dL 8.5(L) 8.4(L) -  Total Protein 6.5 - 8.1 g/dL 5.4(L) 5.4(L) -  Total Bilirubin 0.3 - 1.2 mg/dL 0.8 0.8 -  Alkaline Phos 38 - 126 U/L 159(H) 182(H) -  AST 15 - 41 U/L 20 23 -  ALT 0 - 44 U/L 28 29 -    Imaging studies: Abdominal x-ray shows NG right at the gastroesophageal junction.  Some small bowel and  large bowel loops distended.  No air under the diaphragm.   Assessment/Plan:  79 y.o.femalewith acute diverticulitis with abscess and perforation6Day Post-Ops/p partial colectomy and end colostomy, complicated by pertinent comorbidities includinganxiety disorder, chronic kidney disease, COPD, hypertension, hypothyroidism, irritable bowel syndrome, history of lung cancer, lupus.  Patient without any clinical improvement.  There is also no clinical deterioration.  Will order CT scan of the abdomen and pelvis to rule out possible etiologies of failure such as intra-abdominal infection (abscess).  We will continue with NGT, TPN.  We will continue with IV antibiotic therapy.  Patient ambulating.  We will continue keeping electrolytes within normal range.  Patient adequately hydrated.  Appreciate hospitalist management of medical comorbidities.  Arnold Long, MD

## 2020-03-13 NOTE — Progress Notes (Signed)
   03/13/20 1100  Clinical Encounter Type  Visited With Patient  Visit Type Initial  Referral From Nurse  Consult/Referral To Chaplain  Chaplain responded to OR, but when she arrived at Pt's room, the doctor was in with her. Chaplain told Pt's daughter that she would be back. When she was on her way to see Pt, the pager went off. Chaplain will follow up later.

## 2020-03-13 NOTE — Progress Notes (Signed)
Pt's NG tube came out while she was sleeping. Notified NP. Orders to place a new one.

## 2020-03-13 NOTE — Progress Notes (Signed)
   03/13/20 1100  Clinical Encounter Type  Visited With Patient  Visit Type Initial  Referral From Nurse  Consult/Referral To Chaplain  Chaplain responded to an OR for prayer, but when she arrived at Pt's room, Pt was not in the room. Chaplain will follow up later.

## 2020-03-14 DIAGNOSIS — K5720 Diverticulitis of large intestine with perforation and abscess without bleeding: Secondary | ICD-10-CM | POA: Diagnosis not present

## 2020-03-14 LAB — BASIC METABOLIC PANEL
Anion gap: 9 (ref 5–15)
BUN: 28 mg/dL — ABNORMAL HIGH (ref 8–23)
CO2: 28 mmol/L (ref 22–32)
Calcium: 8.2 mg/dL — ABNORMAL LOW (ref 8.9–10.3)
Chloride: 105 mmol/L (ref 98–111)
Creatinine, Ser: 1.3 mg/dL — ABNORMAL HIGH (ref 0.44–1.00)
GFR, Estimated: 42 mL/min — ABNORMAL LOW (ref >60)
Glucose, Bld: 150 mg/dL — ABNORMAL HIGH (ref 70–99)
Potassium: 3.7 mmol/L (ref 3.5–5.1)
Sodium: 142 mmol/L (ref 135–145)

## 2020-03-14 LAB — GLUCOSE, CAPILLARY
Glucose-Capillary: 168 mg/dL — ABNORMAL HIGH (ref 70–99)
Glucose-Capillary: 174 mg/dL — ABNORMAL HIGH (ref 70–99)
Glucose-Capillary: 217 mg/dL — ABNORMAL HIGH (ref 70–99)
Glucose-Capillary: 221 mg/dL — ABNORMAL HIGH (ref 70–99)

## 2020-03-14 LAB — MAGNESIUM: Magnesium: 2.1 mg/dL (ref 1.7–2.4)

## 2020-03-14 LAB — TRIGLYCERIDES: Triglycerides: 144 mg/dL (ref <150)

## 2020-03-14 MED ORDER — METOCLOPRAMIDE HCL 5 MG/ML IJ SOLN
10.0000 mg | Freq: Three times a day (TID) | INTRAMUSCULAR | Status: DC
  Administered 2020-03-14 – 2020-03-15 (×2): 10 mg via INTRAVENOUS
  Filled 2020-03-14 (×3): qty 2

## 2020-03-14 MED ORDER — TRAVASOL 10 % IV SOLN
INTRAVENOUS | Status: AC
  Filled 2020-03-14: qty 858

## 2020-03-14 MED ORDER — METOCLOPRAMIDE HCL 5 MG/ML IJ SOLN: 10.0000 mg | Freq: Three times a day (TID) | INTRAMUSCULAR | Status: DC

## 2020-03-14 NOTE — Progress Notes (Signed)
°   03/14/20 1500  Clinical Encounter Type  Visited With Patient  Visit Type Follow-up  Referral From Chaplain  Consult/Referral To Chaplain  Chaplain responded to an OR. When she arrived at the room Pt was resting, therefore chaplain laid a prayer on her. Pt was grateful for the prayer shaw. Chaplain mentioned Pt wanting to talk and she said I do want to talk to you when I am not upset on my stomach. Chaplain prayed with Pt and left.

## 2020-03-14 NOTE — Progress Notes (Signed)
Mobility Specialist - Progress Note   03/14/20 1200  Mobility  Activity Ambulated in hall  Level of Assistance Minimal assist, patient does 75% or more  Assistive Device None (Pt declined AD)  Distance Ambulated (ft) 180 ft  Mobility Response Tolerated well  Mobility performed by Mobility specialist  $Mobility charge 1 Mobility    Pre-mobility: 78 HR, 99% SpO2 Post-mobility: 81 HR, 98% SpO2   Pt was lying in bed upon arrival utilizing room air. Pt agreed to session. Pt denied pain or fatigue, but did c/o nausea. Pt was able to get EOB with supervision. Pt refused CGA belt and AD for session. Pt proceeded to ambulate in hallway with 1-hand assist from mobility tech. No LOB noted, however pt voiced that she felt "shaky" during ambulation. Pt denied dizziness, SOB, or weakness. Overall, pt tolerated session well. Upon return to room, pt c/o fatigue and expressed that hopefully she can get some sound sleep now. Pt was left in bed with all needs in reach.  Kathee Delton Mobility Specialist 03/14/20, 12:42 PM

## 2020-03-14 NOTE — Progress Notes (Signed)
PROGRESS NOTE    Margaret Black  VOP:[id number] DOB: [birth year] DOA: 03/01/2020 PCP: Olin Hauser, DO   Brief Narrative: Taken from prior notes. Margaret Black a 79 y.o.femalewith medical history significant forsignificant forGERD, COPD, lung cancer status post evaluation for radiation therapy, hypothyroidism, SLE andstage IV chronic kidney disease who presentedto the hospital for an outpatient upper and lower endoscopy after evaluation for unintentional weight loss and poor appetite. Admission was requested because patient complained of worsening lower abdominal pain for 2 days.  Found to have diverticulitis with abscesses, history of multiple prior diverticulitis s/p colectomy and colostomy bag placement. Patient was started on TPN. Postoperative colitis. CT scan on 12/21 with no new abnormality.  Subjective: POD  7. Patient started passing some gas in colostomy bag, daughter at bedside. Still no output, mild nausea. Draining quite a bit through NG tube  Assessment & Plan:   Principal Problem:   Diverticulitis of large intestine with abscess without bleeding Active Problems:   GERD   Acute kidney injury superimposed on CKD (HCC)   Essential hypertension   Anemia in chronic kidney disease   Protein-calorie malnutrition, severe   Hypothyroidism   SBO (small bowel obstruction) (HCC)  acute recurrent diverticulitis/postoperative ileus. Patient was taken to the OR for left hemicolectomy and colostomy by surgery. Developed postoperative Ileus requiring NG tube and TPN. Started passing some gas and bowel sounds were positive today. No drainage in colostomy bag. Continue to have significant drainage through NG tube. Surgery is following-appreciate their recommendations. -Continue with TPN. -Continue with NG tube-can start clamping if no more nausea. -Continue with IV Zosyn  AKI with CKD stage IIIb. AKI resolved. Creatinine around baseline now. -Continue to  monitor -Avoid nephrotoxins  Essential hypertension/hyperaldosteronism. Blood pressure within goal. -Continue with Bystolic, nifedipine and Aldactone.  Hypothyroidism. -Continue with Synthroid.  GERD. -Continue with Protonix.  Anemia of chronic disease. Hemoglobin seems stable. -Continue to monitor  Severe protein calorie malnutrition and weight loss.  -Nutrition Status: Nutrition Problem: Severe Malnutrition Etiology: chronic illness (COPD, CKD, inadequate oral intake related to diverticulitis since 12/2019) Signs/Symptoms: energy intake < or equal to 75% for > or equal to 1 month,percent weight loss,severe fat depletion,moderate muscle depletion,severe muscle depletion Percent weight loss: 13.4 % Interventions: Refer to RD note for recommendations  --on TPN  Hyperglycemia suspected due to TPN -- continue IV insulin via TPN. No prior diagnosis of diabetes.   Objective: Vitals:   03/14/20 0638 03/14/20 0900 03/14/20 1158 03/14/20 1655  BP: (!) 118/51 (!) 141/58 135/60 (!) 153/63  Pulse: 66 70 72 78  Resp: 18 20 16 20   Temp: 98.5 F (36.9 C) 97.8 F (36.6 C) 97.8 F (36.6 C) 97.8 F (36.6 C)  TempSrc: Oral Oral Oral Oral  SpO2: 99% 100% 99% 100%  Weight:      Height:        Intake/Output Summary (Last 24 hours) at 03/14/2020 1751 Last data filed at 03/14/2020 1555 Gross per 24 hour  Intake 1847.07 ml  Output 1340 ml  Net 507.07 ml   Filed Weights   03/07/20 0500 03/11/20 0500 03/12/20 0500  Weight: 57.5 kg 57 kg 57 kg    Examination:  General exam: Appears calm and comfortable, NG tube in place. Respiratory system: Clear to auscultation. Respiratory effort normal. Cardiovascular system: S1 & S2 heard, RRR.  Gastrointestinal system: Soft, nontender, nondistended, bowel sounds positive, colostomy bag intact Central nervous system: Alert and oriented. No focal neurological deficits. Extremities: No edema, no cyanosis,  pulses intact and  symmetrical. Psychiatry: Judgement and insight appear normal. Mood & affect appropriate.    DVT prophylaxis: Lovenox Code Status: DNR Family Communication: Daughter was updated at bedside Disposition Plan:  Status is: Inpatient  Remains inpatient appropriate because:Inpatient level of care appropriate due to severity of illness   Dispo: The patient is from: Home              Anticipated d/c is to: To be determined              Anticipated d/c date is: 3 days              Patient currently is not medically stable to d/c.   Consultants:   Surgery  Procedures:  Colectomy with colostomy.  Antimicrobials:  Zosyn  Data Reviewed: I have personally reviewed following labs and imaging studies  CBC: Recent Labs  Lab 03/08/20 0442 03/11/20 0538 03/12/20 1029  WBC 15.4* 12.9* 11.1*  NEUTROABS  --   --  9.2*  HGB 9.7* 8.1* 7.6*  HCT 30.5* 24.5* 23.4*  MCV 104.1* 97.6 97.5  PLT 414* 499* 831*   Basic Metabolic Panel: Recent Labs  Lab 03/08/20 0617 03/09/20 0409 03/10/20 0416 03/11/20 1018 03/12/20 0150 03/12/20 1029 03/14/20 0430  NA 133* 139 140  --  138 142 142  K 5.1 4.0 3.7  --  3.6 3.6 3.7  CL 104 107 107  --  104 109 105  CO2 22 24 24   --  25 26 28   GLUCOSE 363* 181* 206*  --  234* 180* 150*  BUN 34* 34* 32*  --  30* 29* 28*  CREATININE 1.59* 1.61* 1.57* 1.50* 1.44* 1.34* 1.30*  CALCIUM 8.2* 8.6* 8.4*  --  8.4* 8.5* 8.2*  MG 1.7 2.2  --   --  1.8  --  2.1  PHOS 3.5  --   --   --   --  2.5  --    GFR: Estimated Creatinine Clearance: 31.6 mL/min (A) (by C-G formula based on SCr of 1.3 mg/dL (H)). Liver Function Tests: Recent Labs  Lab 03/08/20 0617 03/12/20 0150 03/12/20 1029  AST 27 23 20   ALT 29 29 28   ALKPHOS 201* 182* 159*  BILITOT 0.8 0.8 0.8  PROT 4.7* 5.4* 5.4*  ALBUMIN 1.7* 1.8* 1.8*   No results for input(s): LIPASE, AMYLASE in the last 168 hours. No results for input(s): AMMONIA in the last 168 hours. Coagulation Profile: No results  for input(s): INR, PROTIME in the last 168 hours. Cardiac Enzymes: No results for input(s): CKTOTAL, CKMB, CKMBINDEX, TROPONINI in the last 168 hours. BNP (last 3 results) No results for input(s): PROBNP in the last 8760 hours. HbA1C: No results for input(s): HGBA1C in the last 72 hours. CBG: Recent Labs  Lab 03/13/20 1800 03/14/20 0033 03/14/20 0533 03/14/20 1200 03/14/20 1658  GLUCAP 175* 217* 174* 221* 168*   Lipid Profile: Recent Labs    03/12/20 0549 03/14/20 0430  TRIG 348* 144   Thyroid Function Tests: No results for input(s): TSH, T4TOTAL, FREET4, T3FREE, THYROIDAB in the last 72 hours. Anemia Panel: No results for input(s): VITAMINB12, FOLATE, FERRITIN, TIBC, IRON, RETICCTPCT in the last 72 hours. Sepsis Labs: No results for input(s): PROCALCITON, LATICACIDVEN in the last 168 hours.  Recent Results (from the past 240 hour(s))  Anaerobic culture     Status: None   Collection Time: 03/07/20  5:37 PM   Specimen: Abdomen; Wound  Result Value Ref Range Status  Specimen Description   Final    ABDOMEN Performed at [facility], [address] [place], [place] [address]    Special Requests   Final    NONE Performed at [facility], [place], [place] [address]    Gram Stain   Final    ABUNDANT WBC PRESENT, PREDOMINANTLY MONONUCLEAR RARE GRAM VARIABLE ROD    Culture   Final    NO ANAEROBES ISOLATED Performed at [facility], [place] [address] [place], [place] [address]    Report Status 03/13/2020 FINAL  Final  Aerobic Culture (superficial specimen)     Status: None   Collection Time: 03/07/20  5:47 PM   Specimen: Wound  Result Value Ref Range Status   Specimen Description WOUND  Final   Special Requests NONE  Final   Gram Stain   Final    FEW WBC PRESENT,BOTH PMN AND MONONUCLEAR NO ORGANISMS SEEN Performed at [facility], [address] [place], [place] [address]    Culture RARE ESCHERICHIA COLI  Final    Report Status 03/10/2020 FINAL  Final   Organism ID, Bacteria ESCHERICHIA COLI  Final      Susceptibility   Escherichia coli - MIC*    AMPICILLIN >=32 RESISTANT Resistant     CEFAZOLIN 8 SENSITIVE Sensitive     CEFEPIME <=0.12 SENSITIVE Sensitive     CEFTAZIDIME <=1 SENSITIVE Sensitive     CEFTRIAXONE <=0.25 SENSITIVE Sensitive     CIPROFLOXACIN >=4 RESISTANT Resistant     GENTAMICIN <=1 SENSITIVE Sensitive     IMIPENEM <=0.25 SENSITIVE Sensitive     TRIMETH/SULFA <=20 SENSITIVE Sensitive     AMPICILLIN/SULBACTAM >=32 RESISTANT Resistant     PIP/TAZO <=4 SENSITIVE Sensitive     * RARE ESCHERICHIA COLI     Radiology Studies: DG Abd 1 View  Result Date: 03/13/2020 CLINICAL DATA:  Nasogastric tube placement EXAM: ABDOMEN - 1 VIEW COMPARISON:  Abdominal CT from 7 days ago FINDINGS: Enteric tube with tip near the GE junction and side-port over the lower esophagus. Need 7 cm of advancement to place the side port at the GE junction. Gas distended small and large bowel seen in the upper abdomen. Postoperative left lower lobe. Normal heart size IMPRESSION: Enteric tube with side port over the lower esophagus. 7 cm of advancement would be needed to place the side port near the GE junction. Electronically Signed   By: Monte Fantasia M.D.   On: 03/13/2020 04:36   CT ABDOMEN PELVIS W CONTRAST  Result Date: 03/13/2020 CLINICAL DATA:  Status post partial colectomy on 12/15 with colostomy, severe right abdominal pain EXAM: CT ABDOMEN AND PELVIS WITH CONTRAST TECHNIQUE: Multidetector CT imaging of the abdomen and pelvis was performed using the standard protocol following bolus administration of intravenous contrast. CONTRAST:  3mL OMNIPAQUE IOHEXOL 300 MG/ML  SOLN COMPARISON:  03/06/2020 FINDINGS: Lower chest: Trace right pleural effusion. Hepatobiliary: 12 mm cyst in the posterior right hepatic lobe (series 2/image 21). Additional subcentimeter probable cysts. Status post cholecystectomy. Mild  intrahepatic and extrahepatic ductal dilatation. Dilated common duct, measuring 14 mm, although smoothly tapering at the ampulla. Pancreas: Within normal limits. Spleen: Within normal limits. Adrenals/Urinary Tract: Adrenal glands are within normal limits. Kidneys are within normal limits.  No hydronephrosis. Bladder is within normal limits. Stomach/Bowel: Enteric tube terminates at the GE junction. Stomach is within normal limits. Status post left hemicolectomy with left mid abdominal colostomy. Hartmann's pouch with distal sigmoid diverticulosis, without evidence  of diverticulitis. No evidence of bowel obstruction. Appendix is not discretely visualized. Vascular/Lymphatic: No evidence of abdominal aortic aneurysm. Atherosclerotic calcifications of the abdominal aorta and branch vessels. No suspicious abdominopelvic lymphadenopathy. Reproductive: Status post hysterectomy. No adnexal masses. Other: Trace mesenteric fluid/stranding. Right mid abdominal surgical drain terminates in the left lower pelvis. Prior multiloculated fluid collection/abscess in the left lower pelvis has resolved. No free air. Musculoskeletal: Midline skin staples with postsurgical changes along the anterior abdominal wall. Visualized osseous structures are within normal limits. IMPRESSION: Status post left hemicolectomy with left mid abdominal colostomy. Prior multiloculated fluid collection/abscess in the left lower pelvis has resolved. Surgical drain terminates in the left lower pelvis. No free air. Enteric tube terminates at the GE junction. Electronically Signed   By: Julian Hy M.D.   On: 03/13/2020 11:51    Scheduled Meds:  Chlorhexidine Gluconate Cloth  6 each Topical Daily   enoxaparin (LOVENOX) injection  30 mg Subcutaneous Q24H   insulin aspart  0-9 Units Subcutaneous Q6H   levothyroxine  88 mcg Oral Q0600   LORazepam  0.5 mg Oral QHS   metoCLOPramide (REGLAN) injection  10 mg Intravenous Q8H   nebivolol  5 mg  Oral Q2000   NIFEdipine  60 mg Oral Q2000   nystatin  5 mL Oral QID   pantoprazole (PROTONIX) IV  40 mg Intravenous Q12H   spironolactone  12.5 mg Oral Daily   Continuous Infusions:  sodium chloride Stopped (03/11/20 0206)   piperacillin-tazobactam Stopped (03/14/20 1504)   TPN ADULT (ION) 65 mL/hr at 03/14/20 1555   TPN ADULT (ION)       LOS: 13 days   Time spent: 45 minutes.  Lorella Nimrod, MD [facility]  If 7PM-7AM, please contact night-coverage [email or web address]  03/14/2020, 5:51 PM   This record has been created using Systems analyst. Errors have been sought and corrected,but may not always be located. Such creation errors do not reflect on the standard of care.

## 2020-03-14 NOTE — Progress Notes (Signed)
[facility] Hospital Day(s): 13.   Post op day(s): 7 Days Post-Op.   Interval History: Patient seen and examined, no acute events or new complaints overnight. Patient reports feeling nauseated. She does endorses passing gas through the colosostomy and the daughter needed to vent the bag to drain the gas.  She has been hearing rumbling and passing gas.  Vital signs in last 24 hours: [min-max] current  Temp:  [97.8 F (36.6 C)-98.5 F (36.9 C)] 97.8 F (36.6 C) (12/22 0900) Pulse Rate:  [66-74] 70 (12/22 0900) Resp:  [16-20] 20 (12/22 0900) BP: (118-141)/(51-58) 141/58 (12/22 0900) SpO2:  [99 %-100 %] 100 % (12/22 0900)     Height: 5\' 5"  (165.1 cm) Weight: 57 kg BMI (Calculated): 20.91   Physical Exam:  Constitutional: alert, cooperative and no distress  Respiratory: breathing non-labored at rest  Cardiovascular: regular rate and sinus rhythm  Gastrointestinal: soft, tender, and non-distended.  Wound is dry and clean.  Colostomy is pink and patent.  Labs:  CBC Latest Ref Rng & Units 03/12/2020 03/11/2020 03/08/2020  WBC 4.0 - 10.5 K/uL 11.1(H) 12.9(H) 15.4(H)  Hemoglobin 12.0 - 15.0 g/dL 7.6(L) 8.1(L) 9.7(L)  Hematocrit 36.0 - 46.0 % 23.4(L) 24.5(L) 30.5(L)  Platelets 150 - 400 K/uL 505(H) 499(H) 414(H)   CMP Latest Ref Rng & Units 03/14/2020 03/12/2020 03/12/2020  Glucose 70 - 99 mg/dL 150(H) 180(H) 234(H)  BUN 8 - 23 mg/dL 28(H) 29(H) 30(H)  Creatinine 0.44 - 1.00 mg/dL 1.30(H) 1.34(H) 1.44(H)  Sodium 135 - 145 mmol/L 142 142 138  Potassium 3.5 - 5.1 mmol/L 3.7 3.6 3.6  Chloride 98 - 111 mmol/L 105 109 104  CO2 22 - 32 mmol/L 28 26 25   Calcium 8.9 - 10.3 mg/dL 8.2(L) 8.5(L) 8.4(L)  Total Protein 6.5 - 8.1 g/dL - 5.4(L) 5.4(L)  Total Bilirubin 0.3 - 1.2 mg/dL - 0.8 0.8  Alkaline Phos 38 - 126 U/L - 159(H) 182(H)  AST 15 - 41 U/L - 20 23  ALT 0 - 44 U/L - 28 29    Imaging studies: No new pertinent imaging studies   Assessment/Plan:  79  y.o.femalewith acute diverticulitis with abscess and perforation7Day Post-Ops/p partial colectomy and end colostomy, complicated by pertinent comorbidities includinganxiety disorder, chronic kidney disease, COPD, hypertension, hypothyroidism, irritable bowel syndrome, history of lung cancer, lupus.  Patient recovering slowly.  Today's the first day patient hospital gas passing through colostomy.  The daughter was present at the time.  This is a great sign off finally having some sign of bowel function.  She is still fibrillated be nauseated though so I will give some nausea medication.  If she feels better later will we can consider clamping the NGT to see if she tolerates it without nausea.  Has agreed to continue with TPN, antibiotic therapy.  I remove the right lower quadrant drain.  Wound looks dry and clean.  Patient is ambulating.  Appreciate hospitalist further management of medical comorbidities.  Arnold Long, MD

## 2020-03-14 NOTE — Consult Note (Addendum)
[facility] Nurse ostomy consult note Stoma type/location:  Colostomy surgery was performed 12/15 Stomal assessment/size: Stoma is red and viable, slightly above skin level, 1 1/4 inches Peristomal assessment:  Intact skin surrounding Output: still no stool or flatus at this time  Ostomy pouching: 2pc.  Education provided: Applied barrier ring to attempt to maintain a seal and 2 piece pouch.  Demonstrated procedure to daughter at the bedside but pt is depressed and wound not look at the stoma. Daughter at bedside is familiar with pouching activities and was able to apply with minimal assistance. Pt was able to open and close velcro to empty.  Educational materials and 4 extra sets of barrier rings/wafers/pouches left in the room.   Enrolled patient in [facility] program: Yes, previously Julien Girt MSN, [place], [place], [place], [place]

## 2020-03-14 NOTE — Progress Notes (Signed)
PHARMACY - TOTAL PARENTERAL NUTRITION CONSULT NOTE   Indication: severe diverticulitis  Patient Measurements: Height: 5\' 5"  (165.1 cm) Weight: 57 kg (125 lb 10.6 oz) IBW/kg (Calculated) : 57 TPN AdjBW (KG): 52.2 Body mass index is 20.91 kg/m.   Assessment:  recurrent diverticulitis,  per surgery note: this will be her third trial of antibiotics, second hospitalization. typical recommendation is to proceed with surgical resection of the colon.  S/P colectomy and end colostomy 12/15.   Glucose / Insulin: BG 147 - 217 SSI/24hr= 5 units required Electrolytes: WNL Renal: Scr 2.07 -->1.34-->1.61-->1.44 LFTs / TGs: LFTs WNL   TG 123-->150>144 Prealbumin / albumin:   Alb 2.2-->1.7  preAlb 6.4-->7.2 Intake / Output; MIVF: 9244 / 2450 No MIVF GI Imaging:  12/21 CT: Prior multiloculated fluid collection/abscess in the left lower pelvis has resolved Surgeries / Procedures:  12/15 partial colectomy and end colostomy Central access: PICC 03/03/20 TPN start date: 03/03/20  Nutritional Goals (per RD recommendation on 03/05/20): kCal: 1565-1830, Protein: 80-95 g, Fluid: >/= 1.3 L   Current Nutrition:  NPO   Plan:   continue TPN rate at 65 ml/hr (total volume 1660 mL)  Nutritional Components: ? Protein: 55 G/L- Clinisol 10% (85.8 g/day)  ? Dextrose: 21 % (327.6 g/day) ? Lipids: 18 g/L (28 g/day)   ? kCal: 1737/day  Electrolytes in TPN: 14mEq/L of Na, 30 mEq/L of K, 69mEq/L of Ca, 58mEq/L of Mg, and 65mmol/L of Phos. Cl:Ac ratio 1:1  continue 10 units insulin inTPN  Add standard MVI and trace elements to TPN daily  continue sensitive SSI at every 6 hour intervals  Monitor TPN labs on Mon/Thurs   [name] 03/14/2020,7:08 AM

## 2020-03-15 DIAGNOSIS — K5720 Diverticulitis of large intestine with perforation and abscess without bleeding: Secondary | ICD-10-CM | POA: Diagnosis not present

## 2020-03-15 LAB — GLUCOSE, CAPILLARY
Glucose-Capillary: 180 mg/dL — ABNORMAL HIGH (ref 70–99)
Glucose-Capillary: 211 mg/dL — ABNORMAL HIGH (ref 70–99)
Glucose-Capillary: 211 mg/dL — ABNORMAL HIGH (ref 70–99)

## 2020-03-15 LAB — PHOSPHORUS: Phosphorus: 3.2 mg/dL (ref 2.5–4.6)

## 2020-03-15 LAB — COMPREHENSIVE METABOLIC PANEL
ALT: 97 U/L — ABNORMAL HIGH (ref 0–44)
AST: 72 U/L — ABNORMAL HIGH (ref 15–41)
Albumin: 1.8 g/dL — ABNORMAL LOW (ref 3.5–5.0)
Alkaline Phosphatase: 167 U/L — ABNORMAL HIGH (ref 38–126)
Anion gap: 3 — ABNORMAL LOW (ref 5–15)
BUN: 29 mg/dL — ABNORMAL HIGH (ref 8–23)
CO2: 29 mmol/L (ref 22–32)
Calcium: 8.5 mg/dL — ABNORMAL LOW (ref 8.9–10.3)
Chloride: 107 mmol/L (ref 98–111)
Creatinine, Ser: 1.38 mg/dL — ABNORMAL HIGH (ref 0.44–1.00)
GFR, Estimated: 39 mL/min — ABNORMAL LOW (ref >60)
Glucose, Bld: 230 mg/dL — ABNORMAL HIGH (ref 70–99)
Potassium: 4.6 mmol/L (ref 3.5–5.1)
Sodium: 139 mmol/L (ref 135–145)
Total Bilirubin: 0.5 mg/dL (ref 0.3–1.2)
Total Protein: 5.4 g/dL — ABNORMAL LOW (ref 6.5–8.1)

## 2020-03-15 LAB — MAGNESIUM: Magnesium: 2 mg/dL (ref 1.7–2.4)

## 2020-03-15 MED ORDER — POLYETHYLENE GLYCOL 3350 17 G PO PACK
17.0000 g | PACK | Freq: Every day | ORAL | Status: DC
  Administered 2020-03-15: 17 g via ORAL
  Filled 2020-03-15 (×2): qty 1

## 2020-03-15 MED ORDER — LORAZEPAM 0.5 MG PO TABS
0.5000 mg | ORAL_TABLET | ORAL | Status: DC | PRN
  Administered 2020-03-15 – 2020-03-20 (×15): 0.5 mg via ORAL
  Filled 2020-03-15 (×15): qty 1

## 2020-03-15 MED ORDER — TRAVASOL 10 % IV SOLN
INTRAVENOUS | Status: AC
  Filled 2020-03-15: qty 858

## 2020-03-15 MED ORDER — METOCLOPRAMIDE HCL 5 MG/ML IJ SOLN
5.0000 mg | Freq: Three times a day (TID) | INTRAMUSCULAR | Status: DC
  Administered 2020-03-15 – 2020-03-17 (×6): 5 mg via INTRAVENOUS
  Filled 2020-03-15 (×6): qty 2

## 2020-03-15 NOTE — TOC Progression Note (Signed)
Transition of Care [facility]) - Progression Note    Patient Details  Name: Margaret Black MRN: [id number] Date of Birth: Sep 22, 1940  Transition of [facility]) CM/SW Contact  Beverly Sessions, RN Phone Number: 03/15/2020, 10:57 AM  Clinical Narrative:     Patient not medically ready for discharge Followed up with patient about decision of home health agency Daughter Butch Penny at bedside.  Patient deferred me to speak with daughter about discharge planning  Daughter Butch Penny is an Therapist, sports.  She lives in [place], but will be staying with patient after discharge She is agreeable to home health services.  CMS Medicare.gov Compare Post Acute Care list reviewed with daughter.  [facility] selected.  Referral made and accepted by [facility] with Alvis Lemmings.   Daughter agreeable to [facility] and [facility].  Referral made to [facility] with [employer] and to deliver to room prior to discharge   Expected Discharge Plan: [place] Barriers to Discharge: Continued Medical Work up  Expected Discharge Plan and Services Expected Discharge Plan: [place] arrangements for the past 2 months: Single Family Home                 DME Arranged: Gilford Rile rolling,3-N-1 DME Agency: [employer] Date DME Agency Contacted: 03/15/20   Representative spoke with at DME Agency: [place]: RN,PT,OT [place] Agency: [place] Date [place]: 03/15/20   Representative spoke with at [place]: [place] ([place]) Interventions    Readmission Risk Interventions Readmission Risk Prevention Plan 03/02/2020  Transportation Screening Complete  PCP or Specialist Appt within 3-5 Days Complete  Social Work Consult for [place] Planning/Counseling [place] Not Applicable  Medication Review Press photographer) Complete  Some recent data might be hidden

## 2020-03-15 NOTE — Care Management Important Message (Signed)
Important Message  Patient Details  Name: Margaret Black MRN: [id number] Date of Birth: Feb 12, 1941   Medicare Important Message Given:  Yes     Juliann Pulse A Jaeleah Smyser 03/15/2020, 11:30 AM

## 2020-03-15 NOTE — Progress Notes (Signed)
PROGRESS NOTE    Margaret Black  XAJ:[id number] DOB: April 26, 1940 DOA: 03/01/2020 PCP: Olin Hauser, DO   Brief Narrative: Taken from prior notes. Margaret Black Leederis a 79 y.o.femalewith medical history significant forsignificant forGERD, COPD, lung cancer status post evaluation for radiation therapy, hypothyroidism, SLE andstage IV chronic kidney disease who presentedto the hospital for an outpatient upper and lower endoscopy after evaluation for unintentional weight loss and poor appetite. Admission was requested because patient complained of worsening lower abdominal pain for 2 days.  Found to have diverticulitis with abscesses, history of multiple prior diverticulitis s/p colectomy and colostomy bag placement. Patient was started on TPN. Postoperative colitis. CT scan on 12/21 with no new abnormality.  Subjective: Patient has no new complaint when seen today.  No discharge at colostomy bag yet.  Some gas.  Denies any nausea or vomiting, some decreased in her NG tube secretions since morning.  Daughter at bedside.  Assessment & Plan:   Principal Problem:   Diverticulitis of large intestine with abscess without bleeding Active Problems:   GERD   Acute kidney injury superimposed on CKD (HCC)   Essential hypertension   Anemia in chronic kidney disease   Protein-calorie malnutrition, severe   Hypothyroidism   SBO (small bowel obstruction) (HCC)  acute recurrent diverticulitis/postoperative ileus. Patient was taken to the OR for left hemicolectomy and colostomy by surgery. Developed postoperative Ileus requiring NG tube and TPN. Started passing some gas, No drainage in colostomy bag. NG tube secretions seems improving. Worsening transaminitis with TPN. Surgery is following-appreciate their recommendations. -Continue with TPN. -Continue with NG tube-can start clamping if no more nausea, waiting for surgical team response regarding clamping her NG tube and starting her on  clear liquid. -Discontinue Zosyn-completed a 2-week course.  AKI with CKD stage IIIb. AKI resolved. Creatinine around baseline now. -Continue to monitor -Avoid nephrotoxins  Essential hypertension/hyperaldosteronism. Blood pressure within goal. -Continue with Bystolic, nifedipine and Aldactone.  Hypothyroidism. -Continue with Synthroid.  Anxiety.  Patient with worsening anxiety for fear of unknown.  Requesting little more than Xanax. -Give her Ativan every 4 hourly as needed.  GERD. -Continue with Protonix.  Anemia of chronic disease. Hemoglobin seems stable. -Continue to monitor  Severe protein calorie malnutrition and weight loss.  -Nutrition Status: Nutrition Problem: Severe Malnutrition Etiology: chronic illness (COPD, CKD, inadequate oral intake related to diverticulitis since 12/2019) Signs/Symptoms: energy intake < or equal to 75% for > or equal to 1 month,percent weight loss,severe fat depletion,moderate muscle depletion,severe muscle depletion Percent weight loss: 13.4 % Interventions: Refer to RD note for recommendations  --on TPN  Hyperglycemia suspected due to TPN -- continue IV insulin via TPN. No prior diagnosis of diabetes.   Objective: Vitals:   03/15/20 0100 03/15/20 0518 03/15/20 0854 03/15/20 1139  BP: 137/61 139/63 (!) 127/59 (!) 138/56  Pulse: 73 73 72 74  Resp: 19 20 19 16   Temp: 98.2 F (36.8 C) 97.9 F (36.6 C) 97.9 F (36.6 C) (!) 96.9 F (36.1 C)  TempSrc: Oral Oral Oral Axillary  SpO2: 99% 99% 100% 100%  Weight:      Height:        Intake/Output Summary (Last 24 hours) at 03/15/2020 1410 Last data filed at 03/15/2020 1110 Gross per 24 hour  Intake 849.83 ml  Output 1600 ml  Net -750.17 ml   Filed Weights   03/07/20 0500 03/11/20 0500 03/12/20 0500  Weight: 57.5 kg 57 kg 57 kg    Examination:  General.  Well-developed elderly  lady, in no acute distress.  NG tube in place. Pulmonary.  Lungs clear bilaterally, normal  respiratory effort. CV.  Regular rate and rhythm, no JVD, rub or murmur. Abdomen.  Soft, nontender, nondistended, BS hypoactive. CNS.  Alert and oriented x3.  No focal neurologic deficit. Extremities.  No edema, no cyanosis, pulses intact and symmetrical. Psychiatry.  Judgment and insight appears normal.   DVT prophylaxis: Lovenox Code Status: DNR Family Communication: Daughter was updated at bedside Disposition Plan:  Status is: Inpatient  Remains inpatient appropriate because:Inpatient level of care appropriate due to severity of illness   Dispo: The patient is from: Home              Anticipated d/c is to: To be determined              Anticipated d/c date is: 3 days              Patient currently is not medically stable to d/c.   Consultants:   Surgery  Procedures:  Colectomy with colostomy.  Antimicrobials:   Data Reviewed: I have personally reviewed following labs and imaging studies  CBC: Recent Labs  Lab 03/11/20 0538 03/12/20 1029  WBC 12.9* 11.1*  NEUTROABS  --  9.2*  HGB 8.1* 7.6*  HCT 24.5* 23.4*  MCV 97.6 97.5  PLT 499* 275*   Basic Metabolic Panel: Recent Labs  Lab 03/09/20 0409 03/10/20 0416 03/11/20 1018 03/12/20 0150 03/12/20 1029 03/14/20 0430 03/15/20 0351  NA 139 140  --  138 142 142 139  K 4.0 3.7  --  3.6 3.6 3.7 4.6  CL 107 107  --  104 109 105 107  CO2 24 24  --  25 26 28 29   GLUCOSE 181* 206*  --  234* 180* 150* 230*  BUN 34* 32*  --  30* 29* 28* 29*  CREATININE 1.61* 1.57* 1.50* 1.44* 1.34* 1.30* 1.38*  CALCIUM 8.6* 8.4*  --  8.4* 8.5* 8.2* 8.5*  MG 2.2  --   --  1.8  --  2.1 2.0  PHOS  --   --   --   --  2.5  --  3.2   GFR: Estimated Creatinine Clearance: 29.7 mL/min (A) (by C-G formula based on SCr of 1.38 mg/dL (H)). Liver Function Tests: Recent Labs  Lab 03/12/20 0150 03/12/20 1029 03/15/20 0351  AST 23 20 72*  ALT 29 28 97*  ALKPHOS 182* 159* 167*  BILITOT 0.8 0.8 0.5  PROT 5.4* 5.4* 5.4*  ALBUMIN 1.8* 1.8*  1.8*   No results for input(s): LIPASE, AMYLASE in the last 168 hours. No results for input(s): AMMONIA in the last 168 hours. Coagulation Profile: No results for input(s): INR, PROTIME in the last 168 hours. Cardiac Enzymes: No results for input(s): CKTOTAL, CKMB, CKMBINDEX, TROPONINI in the last 168 hours. BNP (last 3 results) No results for input(s): PROBNP in the last 8760 hours. HbA1C: No results for input(s): HGBA1C in the last 72 hours. CBG: Recent Labs  Lab 03/14/20 1200 03/14/20 1658 03/15/20 0055 03/15/20 0512 03/15/20 1135  GLUCAP 221* 168* 211* 211* 180*   Lipid Profile: Recent Labs    03/14/20 0430  TRIG 144   Thyroid Function Tests: No results for input(s): TSH, T4TOTAL, FREET4, T3FREE, THYROIDAB in the last 72 hours. Anemia Panel: No results for input(s): VITAMINB12, FOLATE, FERRITIN, TIBC, IRON, RETICCTPCT in the last 72 hours. Sepsis Labs: No results for input(s): PROCALCITON, LATICACIDVEN in the last 168 hours.  Recent  Results (from the past 240 hour(s))  Anaerobic culture     Status: None   Collection Time: 03/07/20  5:37 PM   Specimen: Abdomen; Wound  Result Value Ref Range Status   Specimen Description   Final    ABDOMEN Performed at [facility], [address] [place], [place] [address]    Special Requests   Final    NONE Performed at [facility], [address] [place], [place] [address]    Gram Stain   Final    ABUNDANT WBC PRESENT, PREDOMINANTLY MONONUCLEAR RARE GRAM VARIABLE ROD    Culture   Final    NO ANAEROBES ISOLATED Performed at [facility], [place] [address] [place], [place] [address]    Report Status 03/13/2020 FINAL  Final  Aerobic Culture (superficial specimen)     Status: None   Collection Time: 03/07/20  5:47 PM   Specimen: Wound  Result Value Ref Range Status   Specimen Description WOUND  Final   Special Requests NONE  Final   Gram Stain   Final    FEW WBC PRESENT,BOTH PMN AND  MONONUCLEAR NO ORGANISMS SEEN Performed at [facility], [address] [place], [place] [address]    Culture RARE ESCHERICHIA COLI  Final   Report Status 03/10/2020 FINAL  Final   Organism ID, Bacteria ESCHERICHIA COLI  Final      Susceptibility   Escherichia coli - MIC*    AMPICILLIN >=32 RESISTANT Resistant     CEFAZOLIN 8 SENSITIVE Sensitive     CEFEPIME <=0.12 SENSITIVE Sensitive     CEFTAZIDIME <=1 SENSITIVE Sensitive     CEFTRIAXONE <=0.25 SENSITIVE Sensitive     CIPROFLOXACIN >=4 RESISTANT Resistant     GENTAMICIN <=1 SENSITIVE Sensitive     IMIPENEM <=0.25 SENSITIVE Sensitive     TRIMETH/SULFA <=20 SENSITIVE Sensitive     AMPICILLIN/SULBACTAM >=32 RESISTANT Resistant     PIP/TAZO <=4 SENSITIVE Sensitive     * RARE ESCHERICHIA COLI     Radiology Studies: No results found.  Scheduled Meds:  Chlorhexidine Gluconate Cloth  6 each Topical Daily   enoxaparin (LOVENOX) injection  30 mg Subcutaneous Q24H   insulin aspart  0-9 Units Subcutaneous Q6H   levothyroxine  88 mcg Oral Q0600   LORazepam  0.5 mg Oral QHS   metoCLOPramide (REGLAN) injection  5 mg Intravenous Q8H   nebivolol  5 mg Oral Q2000   NIFEdipine  60 mg Oral Q2000   nystatin  5 mL Oral QID   pantoprazole (PROTONIX) IV  40 mg Intravenous Q12H   spironolactone  12.5 mg Oral Daily   Continuous Infusions:  sodium chloride Stopped (03/11/20 0206)   TPN ADULT (ION) 65 mL/hr at 03/14/20 1814   TPN ADULT (ION)       LOS: 14 days   Time spent: 35 minutes.  Lorella Nimrod, MD [facility]  If 7PM-7AM, please contact night-coverage [email or web address]  03/15/2020, 2:10 PM   This record has been created using Systems analyst. Errors have been sought and corrected,but may not always be located. Such creation errors do not reflect on the standard of care.

## 2020-03-15 NOTE — Progress Notes (Signed)
[facility] Day(s): 14.   Post op day(s): 8 Days Post-Op.   Interval History: Patient seen and examined, no acute events or new complaints overnight. Patient reports feeling the same. Today the nausea has improved with Reglan. Still feeling gas coming out of the colostomy.   Vital signs in last 24 hours: [min-max] current  Temp:  [96.9 F (36.1 C)-98.7 F (37.1 C)] 98.3 F (36.8 C) (12/23 1542) Pulse Rate:  [72-74] 74 (12/23 1542) Resp:  [16-20] 16 (12/23 1542) BP: (127-144)/(56-63) 136/60 (12/23 1542) SpO2:  [97 %-100 %] 97 % (12/23 1542)     Height: 5\' 5"  (165.1 cm) Weight: 57 kg BMI (Calculated): 20.91   Physical Exam:  Constitutional: alert, cooperative and no distress  Respiratory: breathing non-labored at rest  Cardiovascular: regular rate and sinus rhythm  Gastrointestinal: soft, non-tender, and non-distended  Labs:  CBC Latest Ref Rng & Units 03/12/2020 03/11/2020 03/08/2020  WBC 4.0 - 10.5 K/uL 11.1(H) 12.9(H) 15.4(H)  Hemoglobin 12.0 - 15.0 g/dL 7.6(L) 8.1(L) 9.7(L)  Hematocrit 36.0 - 46.0 % 23.4(L) 24.5(L) 30.5(L)  Platelets 150 - 400 K/uL 505(H) 499(H) 414(H)   CMP Latest Ref Rng & Units 03/15/2020 03/14/2020 03/12/2020  Glucose 70 - 99 mg/dL 230(H) 150(H) 180(H)  BUN 8 - 23 mg/dL 29(H) 28(H) 29(H)  Creatinine 0.44 - 1.00 mg/dL 1.38(H) 1.30(H) 1.34(H)  Sodium 135 - 145 mmol/L 139 142 142  Potassium 3.5 - 5.1 mmol/L 4.6 3.7 3.6  Chloride 98 - 111 mmol/L 107 105 109  CO2 22 - 32 mmol/L 29 28 26   Calcium 8.9 - 10.3 mg/dL 8.5(L) 8.2(L) 8.5(L)  Total Protein 6.5 - 8.1 g/dL 5.4(L) - 5.4(L)  Total Bilirubin 0.3 - 1.2 mg/dL 0.5 - 0.8  Alkaline Phos 38 - 126 U/L 167(H) - 159(H)  AST 15 - 41 U/L 72(H) - 20  ALT 0 - 44 U/L 97(H) - 28    Imaging studies: No new pertinent imaging studies   Assessment/Plan:  79 y.o.femalewith acute diverticulitis with abscess and perforation8Day Post-Ops/p partial colectomy and end colostomy, complicated  by pertinent comorbidities includinganxiety disorder, chronic kidney disease, COPD, hypertension, hypothyroidism, irritable bowel syndrome, history of lung cancer, lupus.  Patient recovering slowly. Today continue passing gas but no stool. No nausea today. NGT clamped. Will give Miralax to see if this helps to start moving the bowel. Otherwise, no fever, and adequate vital signs. Completed antibiotic therapy. Will continue with TPN. Patient ambulating.   Arnold Long, MD

## 2020-03-15 NOTE — Progress Notes (Signed)
PT Cancellation Note  Patient Details Name: Margaret Black MRN: [id number] DOB: 1940/07/16   Cancelled Treatment:    Reason Eval/Treat Not Completed: Patient declined, no reason specified. Spoke with patient at bedside. Observed patient ambulating in hallway earlier today with family. Patient declined getting OOB at this time and wants to wait for her daughter to walk again today. PT will continue with attempts at a later date.   Minna Merritts, PT, MPT   Percell Locus 03/15/2020, 4:12 PM

## 2020-03-15 NOTE — Progress Notes (Signed)
PHARMACY - TOTAL PARENTERAL NUTRITION CONSULT NOTE   Indication: severe diverticulitis  Patient Measurements: Height: 5\' 5"  (165.1 cm) Weight: 57 kg (125 lb 10.6 oz) IBW/kg (Calculated) : 57 TPN AdjBW (KG): 52.2 Body mass index is 20.91 kg/m.   Assessment:  recurrent diverticulitis,  per surgery note: this will be her third trial of antibiotics, second hospitalization. typical recommendation is to proceed with surgical resection of the colon.  S/P colectomy and end colostomy 12/15.   Glucose / Insulin: BG 168 - 221 SSI/24hr= 8 units required Electrolytes: WNL Renal: Scr 2.07 -->1.34-->1.61-->1.38 LFTs / TGs: LFTs trending up slightly  TG 123-->150>144 Prealbumin / albumin:   Alb 2.2-->1.8  preAlb 6.4-->7.2 Intake / Output; MIVF: 9233 / 2450 No MIVF GI Imaging:  12/21 CT: Prior multiloculated fluid collection/abscess in the left lower pelvis has resolved Surgeries / Procedures:  12/15 partial colectomy and end colostomy Central access: PICC 03/03/20 TPN start date: 03/03/20  Nutritional Goals (per RD recommendation on 03/05/20): kCal: 1565-1830, Protein: 80-95 g, Fluid: >/= 1.3 L   Current Nutrition:  NPO   Plan:   continue TPN rate at 65 ml/hr (total volume 1660 mL)  Nutritional Components: ? Protein: 55 G/L- Clinisol 10% (85.8 g/day)  ? Dextrose: 21 % (327.6 g/day) ? Lipids: 18 g/L (28 g/day)   ? kCal: 1737/day  Electrolytes in TPN: 41mEq/L of Na, 0 mEq/L of K, 82mEq/L of Ca, 53mEq/L of Mg, and 59mmol/L of Phos. Cl:Ac ratio 1:1  Sodium higher than standard, potassium removed  continue to add 10 units insulin inTPN  Add standard MVI and trace elements to TPN daily  continue sensitive SSI at every 6 hour intervals  Monitor TPN labs on Mon/Thurs, BMP in am 12/24   [name] 03/15/2020,7:47 AM

## 2020-03-16 DIAGNOSIS — K5720 Diverticulitis of large intestine with perforation and abscess without bleeding: Secondary | ICD-10-CM | POA: Diagnosis not present

## 2020-03-16 LAB — GLUCOSE, CAPILLARY
Glucose-Capillary: 190 mg/dL — ABNORMAL HIGH (ref 70–99)
Glucose-Capillary: 195 mg/dL — ABNORMAL HIGH (ref 70–99)
Glucose-Capillary: 209 mg/dL — ABNORMAL HIGH (ref 70–99)

## 2020-03-16 LAB — BASIC METABOLIC PANEL
Anion gap: 8 (ref 5–15)
BUN: 27 mg/dL — ABNORMAL HIGH (ref 8–23)
CO2: 26 mmol/L (ref 22–32)
Calcium: 8.6 mg/dL — ABNORMAL LOW (ref 8.9–10.3)
Chloride: 103 mmol/L (ref 98–111)
Creatinine, Ser: 1.09 mg/dL — ABNORMAL HIGH (ref 0.44–1.00)
GFR, Estimated: 52 mL/min — ABNORMAL LOW (ref >60)
Glucose, Bld: 214 mg/dL — ABNORMAL HIGH (ref 70–99)
Potassium: 3.6 mmol/L (ref 3.5–5.1)
Sodium: 137 mmol/L (ref 135–145)

## 2020-03-16 MED ORDER — POLYETHYLENE GLYCOL 3350 17 G PO PACK
17.0000 g | PACK | Freq: Two times a day (BID) | ORAL | Status: DC
  Administered 2020-03-17: 17 g via ORAL
  Filled 2020-03-16 (×3): qty 1

## 2020-03-16 MED ORDER — ENOXAPARIN SODIUM 40 MG/0.4ML ~~LOC~~ SOLN
40.0000 mg | SUBCUTANEOUS | Status: DC
  Administered 2020-03-17 – 2020-03-20 (×4): 40 mg via SUBCUTANEOUS
  Filled 2020-03-16 (×4): qty 0.4

## 2020-03-16 MED ORDER — TRAVASOL 10 % IV SOLN
INTRAVENOUS | Status: AC
  Filled 2020-03-16: qty 858

## 2020-03-16 NOTE — Progress Notes (Signed)
PROGRESS NOTE    Margaret Black  EXH:[id number] DOB: 09/29/1940 DOA: 03/01/2020 PCP: Margaret Hauser, DO   Brief Narrative: Taken from prior notes. Margaret Black a 79 y.o.femalewith medical history significant forsignificant forGERD, COPD, lung cancer status post evaluation for radiation therapy, hypothyroidism, SLE andstage IV chronic kidney disease who presentedto the hospital for an outpatient upper and lower endoscopy after evaluation for unintentional weight loss and poor appetite. Admission was requested because patient complained of worsening lower abdominal pain for 2 days.  Found to have diverticulitis with abscesses, history of multiple prior diverticulitis s/p colectomy and colostomy bag placement. Patient was started on TPN. Postoperative colitis. CT scan on 12/21 with no new abnormality. NG tube was clamped on 03/15/2020. Completed 2-week course of Zosyn. Started her on clear liquid diet today.  Subjective: Patient was getting quite frustrated from her slow progress, stating that she is getting tired and seriously considering going to hospice. Agrees to try for couple of more days as she started having some gas in colostomy bag.  Tolerating clamping of NG tube, we discussed regarding starting on clear diet today.  Assessment & Plan:   Principal Problem:   Diverticulitis of large intestine with abscess without bleeding Active Problems:   GERD   Acute kidney injury superimposed on CKD (HCC)   Essential hypertension   Anemia in chronic kidney disease   Protein-calorie malnutrition, severe   Hypothyroidism   SBO (small bowel obstruction) (HCC)  acute recurrent diverticulitis/postoperative ileus. Patient was taken to the OR for left hemicolectomy and colostomy by surgery. Developed postoperative Ileus requiring NG tube and TPN.  Completed a 2-week course of Zosyn. Started passing some gas, No drainage in colostomy bag yet. NG tube was clamped since yesterday,  no nausea or vomiting. Surgery is recommending MiraLAX. Worsening transaminitis with TPN. Surgery is following-appreciate their recommendations. -Continue with TPN. -Start her on clear liquid diet.  AKI with CKD stage IIIb. AKI resolved. Creatinine around baseline now. -Continue to monitor -Avoid nephrotoxins  Essential hypertension/hyperaldosteronism. Blood pressure within goal. -Continue with Bystolic, nifedipine and Aldactone.  Hypothyroidism. -Continue with Synthroid.  Anxiety.  Patient with worsening anxiety for fear of unknown.  Requesting little more than Xanax. -Give her Ativan every 4 hourly as needed.  GERD. -Continue with Protonix.  Anemia of chronic disease. Hemoglobin seems stable. -Continue to monitor  Severe protein calorie malnutrition and weight loss.  -Nutrition Status: Nutrition Problem: Severe Malnutrition Etiology: chronic illness (COPD, CKD, inadequate oral intake related to diverticulitis since 12/2019) Signs/Symptoms: energy intake < or equal to 75% for > or equal to 1 month,percent weight loss,severe fat depletion,moderate muscle depletion,severe muscle depletion Percent weight loss: 13.4 % Interventions: Refer to RD note for recommendations  --on TPN  Hyperglycemia suspected due to TPN -- continue IV insulin via TPN. No prior diagnosis of diabetes.   Objective: Vitals:   03/15/20 1542 03/15/20 2042 03/16/20 0521 03/16/20 0800  BP: 136/60 (!) 145/48 (!) 142/57 (!) 139/93  Pulse: 74 76 78 78  Resp: 16 20 18 16   Temp: 98.3 F (36.8 C) 97.9 F (36.6 C) 98.5 F (36.9 C) (!) 97.5 F (36.4 C)  TempSrc: Oral Oral  Oral  SpO2: 97% 99% 99% 99%  Weight:      Height:        Intake/Output Summary (Last 24 hours) at 03/16/2020 0816 Last data filed at 03/16/2020 0310 Gross per 24 hour  Intake 713.8 ml  Output 200 ml  Net 513.8 ml   Danley Danker  Weights   03/07/20 0500 03/11/20 0500 03/12/20 0500  Weight: 57.5 kg 57 kg 57 kg     Examination:  General.  Anxious appearing elderly lady, in no acute distress.  NG tube in place but clamped. Pulmonary.  Lungs clear bilaterally, normal respiratory effort. CV.  Regular rate and rhythm, no JVD, rub or murmur. Abdomen.  Soft, nontender, nondistended, BS positive, colostomy bag with quite a bit of gas, no feces. CNS.  Alert and oriented x3.  No focal neurologic deficit. Extremities.  No edema, no cyanosis, pulses intact and symmetrical. Psychiatry.  Judgment and insight appears normal.  DVT prophylaxis: Lovenox Code Status: DNR Family Communication: Discussed with patient Disposition Plan:  Status is: Inpatient  Remains inpatient appropriate because:Inpatient level of care appropriate due to severity of illness   Dispo: The patient is from: Home              Anticipated d/c is to: To be determined              Anticipated d/c date is: 3 days              Patient currently is not medically stable to d/c.   Consultants:   Surgery  Procedures:  Colectomy with colostomy.  Antimicrobials:   Data Reviewed: I have personally reviewed following labs and imaging studies  CBC: Recent Labs  Lab 03/11/20 0538 03/12/20 1029  WBC 12.9* 11.1*  NEUTROABS  --  9.2*  HGB 8.1* 7.6*  HCT 24.5* 23.4*  MCV 97.6 97.5  PLT 499* 761*   Basic Metabolic Panel: Recent Labs  Lab 03/12/20 0150 03/12/20 1029 03/14/20 0430 03/15/20 0351 03/16/20 0618  NA 138 142 142 139 137  K 3.6 3.6 3.7 4.6 3.6  CL 104 109 105 107 103  CO2 25 26 28 29 26   GLUCOSE 234* 180* 150* 230* 214*  BUN 30* 29* 28* 29* 27*  CREATININE 1.44* 1.34* 1.30* 1.38* 1.09*  CALCIUM 8.4* 8.5* 8.2* 8.5* 8.6*  MG 1.8  --  2.1 2.0  --   PHOS  --  2.5  --  3.2  --    GFR: Estimated Creatinine Clearance: 37.7 mL/min (A) (by C-G formula based on SCr of 1.09 mg/dL (H)). Liver Function Tests: Recent Labs  Lab 03/12/20 0150 03/12/20 1029 03/15/20 0351  AST 23 20 72*  ALT 29 28 97*  ALKPHOS 182*  159* 167*  BILITOT 0.8 0.8 0.5  PROT 5.4* 5.4* 5.4*  ALBUMIN 1.8* 1.8* 1.8*   No results for input(s): LIPASE, AMYLASE in the last 168 hours. No results for input(s): AMMONIA in the last 168 hours. Coagulation Profile: No results for input(s): INR, PROTIME in the last 168 hours. Cardiac Enzymes: No results for input(s): CKTOTAL, CKMB, CKMBINDEX, TROPONINI in the last 168 hours. BNP (last 3 results) No results for input(s): PROBNP in the last 8760 hours. HbA1C: No results for input(s): HGBA1C in the last 72 hours. CBG: Recent Labs  Lab 03/14/20 1658 03/15/20 0055 03/15/20 0512 03/15/20 1135 03/16/20 0522  GLUCAP 168* 211* 211* 180* 190*   Lipid Profile: Recent Labs    03/14/20 0430  TRIG 144   Thyroid Function Tests: No results for input(s): TSH, T4TOTAL, FREET4, T3FREE, THYROIDAB in the last 72 hours. Anemia Panel: No results for input(s): VITAMINB12, FOLATE, FERRITIN, TIBC, IRON, RETICCTPCT in the last 72 hours. Sepsis Labs: No results for input(s): PROCALCITON, LATICACIDVEN in the last 168 hours.  Recent Results (from the past 240 hour(s))  Anaerobic  culture     Status: None   Collection Time: 03/07/20  5:37 PM   Specimen: Abdomen; Wound  Result Value Ref Range Status   Specimen Description   Final    ABDOMEN Performed at [facility], [address] [place], [place] [address]    Special Requests   Final    NONE Performed at [facility], [address] [place], [place] [address]    Gram Stain   Final    ABUNDANT WBC PRESENT, PREDOMINANTLY MONONUCLEAR RARE GRAM VARIABLE ROD    Culture   Final    NO ANAEROBES ISOLATED Performed at [facility], [place] [address], [place], [place] [address]    Report Status 03/13/2020 FINAL  Final  Aerobic Culture (superficial specimen)     Status: None   Collection Time: 03/07/20  5:47 PM   Specimen: Wound  Result Value Ref Range Status   Specimen Description WOUND  Final   Special Requests  NONE  Final   Gram Stain   Final    FEW WBC PRESENT,BOTH PMN AND MONONUCLEAR NO ORGANISMS SEEN Performed at [facility], [address], [place], [place] [address]    Culture RARE ESCHERICHIA COLI  Final   Report Status 03/10/2020 FINAL  Final   Organism ID, Bacteria ESCHERICHIA COLI  Final      Susceptibility   Escherichia coli - MIC*    AMPICILLIN >=32 RESISTANT Resistant     CEFAZOLIN 8 SENSITIVE Sensitive     CEFEPIME <=0.12 SENSITIVE Sensitive     CEFTAZIDIME <=1 SENSITIVE Sensitive     CEFTRIAXONE <=0.25 SENSITIVE Sensitive     CIPROFLOXACIN >=4 RESISTANT Resistant     GENTAMICIN <=1 SENSITIVE Sensitive     IMIPENEM <=0.25 SENSITIVE Sensitive     TRIMETH/SULFA <=20 SENSITIVE Sensitive     AMPICILLIN/SULBACTAM >=32 RESISTANT Resistant     PIP/TAZO <=4 SENSITIVE Sensitive     * RARE ESCHERICHIA COLI     Radiology Studies: No results found.  Scheduled Meds: . Chlorhexidine Gluconate Cloth  6 each Topical Daily  . enoxaparin (LOVENOX) injection  30 mg Subcutaneous Q24H  . insulin aspart  0-9 Units Subcutaneous Q6H  . levothyroxine  88 mcg Oral Q0600  . LORazepam  0.5 mg Oral QHS  . metoCLOPramide (REGLAN) injection  5 mg Intravenous Q8H  . nebivolol  5 mg Oral Q2000  . NIFEdipine  60 mg Oral Q2000  . nystatin  5 mL Oral QID  . pantoprazole (PROTONIX) IV  40 mg Intravenous Q12H  . polyethylene glycol  17 g Oral Daily  . spironolactone  12.5 mg Oral Daily   Continuous Infusions: . sodium chloride Stopped (03/11/20 0206)  . TPN ADULT (ION) 65 mL/hr at 03/16/20 0310  . TPN ADULT (ION)       LOS: 15 days   Time spent: 35 minutes.  Lorella Nimrod, MD [facility]  If 7PM-7AM, please contact night-coverage [email or web address]  03/16/2020, 8:16 AM   This record has been created using Systems analyst. Errors have been sought and corrected,but may not always be located. Such creation errors do not reflect on the standard of care.

## 2020-03-16 NOTE — Progress Notes (Addendum)
PHARMACY - TOTAL PARENTERAL NUTRITION CONSULT NOTE   Indication: severe diverticulitis  Patient Measurements: Height: 5\' 5"  (165.1 cm) Weight: 57 kg (125 lb 10.6 oz) IBW/kg (Calculated) : 57 TPN AdjBW (KG): 52.2 Body mass index is 20.91 kg/m.   Assessment:  Recurrent diverticulitis,  per surgery note: this will be her third trial of antibiotics, second hospitalization. Typical recommendation is to proceed with surgical resection of the colon.    S/P colectomy and end colostomy 12/15 (POD 9).    Glucose / Insulin: BG 180 - 230 SSI/24hr= 2 units required Electrolytes: WNL Renal: Scr 2.07 -->1.34-->1.61-->1.38>1.09 LFTs / TGs: LFTs trending up slightly  TG 123-->150>144 Prealbumin / albumin:   Alb 2.2-->1.8  preAlb 6.4-->7.2 Intake / Output; MIVF: 738 / 513, no MIVF No MIVF GI Imaging:  12/21 CT: Prior multiloculated fluid collection/abscess in the left lower pelvis has resolved Surgeries / Procedures:  12/15 partial colectomy and end colostomy Central access: PICC 03/03/20 TPN start date: 03/03/20  Nutritional Goals (per RD recommendation on 03/05/20): kCal: 1565-1830, Protein: 80-95 g, Fluid: >/= 1.3 L   Current Nutrition:  NPO   Plan:   continue TPN rate at 65 ml/hr (total volume 1660 mL)  Nutritional Components: ? Protein: 55 G/L- Clinisol 10% (85.8 g/day)  ? Dextrose: 21 % (327.6 g/day) ? Lipids: 18 g/L (28 g/day)   ? kCal: 1737/day  Electrolytes in TPN: 71mEq/L of Na, 10 mEq/L of K, 49mEq/L of Ca, 44mEq/L of Mg, and 70mmol/L of Phos. Cl:Ac ratio 1:1  Sodium higher than standard, potassium was removed (but trended down - will add back 10 meq and monitor)  continue to add 10 units insulin inTPN  Add standard MVI and trace elements to TPN daily  continue sensitive SSI at every 6 hour intervals  Monitor TPN labs on Mon/Thurs, BMP in am 12/24   Lu Duffel, PharmD, BCPS Clinical Pharmacist 03/16/2020 7:55 AM

## 2020-03-16 NOTE — Progress Notes (Signed)
PHARMACIST - PHYSICIAN COMMUNICATION  CONCERNING:  Enoxaparin (Lovenox) for DVT Prophylaxis    RECOMMENDATION: Patient was prescribed enoxaprin 30mg  q24 hours for VTE prophylaxis.   Filed Weights   03/07/20 0500 03/11/20 0500 03/12/20 0500  Weight: 57.5 kg (126 lb 12.2 oz) 57 kg (125 lb 10.6 oz) 57 kg (125 lb 10.6 oz)    Body mass index is 20.91 kg/m.  Estimated Creatinine Clearance: 37.7 mL/min (A) (by C-G formula based on SCr of 1.09 mg/dL (H)).   Patient is candidate for enoxaparin 40mg  every 24 hours based on CrCl >93ml/min AND Weight >45kg  DESCRIPTION: Pharmacy has adjusted enoxaparin dose per [facility] policy.  Patient is now receiving enoxaparin 40 mg every 24 hours    Lu Duffel, PharmD, BCPS Clinical Pharmacist 03/16/2020 9:32 AM

## 2020-03-16 NOTE — Progress Notes (Signed)
[place] Hospital Day(s): 15.   Post op day(s): 9 Days Post-Op.   Interval History: Patient seen and examined, no acute events or new complaints overnight. Patient reports feeling frustrated with not improving. She does endorses continue passing gas. Denies nausea or vomiting.   Vital signs in last 24 hours: [min-max] current  Temp:  [97.5 F (36.4 C)-98.5 F (36.9 C)] 97.5 F (36.4 C) (12/24 0800) Pulse Rate:  [74-78] 78 (12/24 0800) Resp:  [16-20] 16 (12/24 0800) BP: (136-145)/(48-93) 139/93 (12/24 0800) SpO2:  [97 %-99 %] 99 % (12/24 0800)     Height: 5\' 5"  (165.1 cm) Weight: 57 kg BMI (Calculated): 20.91   Physical Exam:  Constitutional: alert, cooperative and no distress  Respiratory: breathing non-labored at rest  Cardiovascular: regular rate and sinus rhythm  Gastrointestinal: soft, non-tender, and non-distended. Wound dry and clean. Ostomy pink and patent.   Labs:  CBC Latest Ref Rng & Units 03/12/2020 03/11/2020 03/08/2020  WBC 4.0 - 10.5 K/uL 11.1(H) 12.9(H) 15.4(H)  Hemoglobin 12.0 - 15.0 g/dL 7.6(L) 8.1(L) 9.7(L)  Hematocrit 36.0 - 46.0 % 23.4(L) 24.5(L) 30.5(L)  Platelets 150 - 400 K/uL 505(H) 499(H) 414(H)   CMP Latest Ref Rng & Units 03/16/2020 03/15/2020 03/14/2020  Glucose 70 - 99 mg/dL 214(H) 230(H) 150(H)  BUN 8 - 23 mg/dL 27(H) 29(H) 28(H)  Creatinine 0.44 - 1.00 mg/dL 1.09(H) 1.38(H) 1.30(H)  Sodium 135 - 145 mmol/L 137 139 142  Potassium 3.5 - 5.1 mmol/L 3.6 4.6 3.7  Chloride 98 - 111 mmol/L 103 107 105  CO2 22 - 32 mmol/L 26 29 28   Calcium 8.9 - 10.3 mg/dL 8.6(L) 8.5(L) 8.2(L)  Total Protein 6.5 - 8.1 g/dL - 5.4(L) -  Total Bilirubin 0.3 - 1.2 mg/dL - 0.5 -  Alkaline Phos 38 - 126 U/L - 167(H) -  AST 15 - 41 U/L - 72(H) -  ALT 0 - 44 U/L - 97(H) -    Imaging studies: No new pertinent imaging studies   Assessment/Plan:  79 y.o.femalewith acute diverticulitis with abscess and perforation9Day Post-Ops/p partial colectomy  and end colostomy, complicated by pertinent comorbidities includinganxiety disorder, chronic kidney disease, COPD, hypertension, hypothyroidism, irritable bowel syndrome, history of lung cancer, lupus.  Patient recovering slowly. There has been no clinical deterioration. She continue passing gas through ostomy. No nausea and vomiting. I had a long conversation with patient and daughter explaining that this is not unexpected in this type of surgery. The fact that she continue passing gas and not getting nauseated with the NGT clamped is a good sign. Vital signs are stable and afebrile. Agree to start clear liquid diet trial with NGT clamped. If develop nausea, NGT can be placed to LIS. I will increase Miralax to BID since patient reports having chronic issue with constipation. Will continue to follow closely.   Arnold Long, MD

## 2020-03-16 NOTE — Progress Notes (Signed)
PT Cancellation Note  Patient Details Name: Margaret Black MRN: [id number] DOB: 03/28/1940   Cancelled Treatment:    Reason Eval/Treat Not Completed: Patient declined, no reason specified (Patient identified her name and date of birth and stated she no longer wishes to received PT services. She states she has been doing it for a month and is not getting better. She states she is not going to practice mobility any more and it is her choice. Explained the benefits of mobility and encouraged patient to participate, but she continued to decline. Let her know we will check back with her at a later date/time to make sure she still wants to continue with this decision before discharging services and she agreed.)   Everlean Alstrom. Graylon Good, PT, DPT 03/16/20, 10:09 AM

## 2020-03-17 DIAGNOSIS — K5720 Diverticulitis of large intestine with perforation and abscess without bleeding: Secondary | ICD-10-CM | POA: Diagnosis not present

## 2020-03-17 LAB — GLUCOSE, CAPILLARY
Glucose-Capillary: 129 mg/dL — ABNORMAL HIGH (ref 70–99)
Glucose-Capillary: 179 mg/dL — ABNORMAL HIGH (ref 70–99)
Glucose-Capillary: 200 mg/dL — ABNORMAL HIGH (ref 70–99)

## 2020-03-17 MED ORDER — TRAVASOL 10 % IV SOLN
INTRAVENOUS | Status: DC
  Filled 2020-03-17: qty 858

## 2020-03-17 MED ORDER — METOCLOPRAMIDE HCL 5 MG/ML IJ SOLN
5.0000 mg | Freq: Two times a day (BID) | INTRAMUSCULAR | Status: DC
  Administered 2020-03-17 – 2020-03-18 (×4): 5 mg via INTRAVENOUS
  Filled 2020-03-17 (×4): qty 2

## 2020-03-17 MED ORDER — TRAVASOL 10 % IV SOLN
INTRAVENOUS | Status: AC
  Filled 2020-03-17: qty 396

## 2020-03-17 NOTE — Progress Notes (Addendum)
PHARMACY - TOTAL PARENTERAL NUTRITION CONSULT NOTE   Indication: severe diverticulitis  Patient Measurements: Height: 5\' 5"  (165.1 cm) Weight: 57 kg (125 lb 10.6 oz) IBW/kg (Calculated) : 57 TPN AdjBW (KG): 52.2 Body mass index is 20.91 kg/m.   Assessment:  79 y.o.femalewith acute diverticulitis with abscess and perforation10Days Post-Ops/p partial colectomy and end colostomy, complicated by pertinent comorbidities includinganxiety disorder, chronic kidney disease, COPD, hypertension, hypothyroidism, irritable bowel syndrome, history of lung cancer, lupus.    Glucose / Insulin: BG 179 - 214 SSI/24hr= 4 units required Electrolytes: WNL Renal: Scr 2.07 -->1.34-->1.61-->1.09 LFTs / TGs: LFTs trending up slightly  TG 123-->150>144 Prealbumin / albumin:   Alb 2.2-->1.8  preAlb 6.4-->7.2 Intake / Output; MIVF: 1189 / 789 No MIVF GI Imaging:  12/21 CT: Prior multiloculated fluid collection/abscess in the left lower pelvis has resolved Surgeries / Procedures:  12/15 partial colectomy and end colostomy Central access: PICC 03/03/20 TPN start date: 03/03/20  Nutritional Goals (per RD recommendation on 03/05/20): kCal: 6606-3016, Protein: 80-95 g, Fluid: >/= 1.3 L   Current Nutrition: CLD started 12/24  Plan:   reduce TPN rate to 30 ml/hr (total volume 820 mL)  Nutritional Components: ? Protein: 55 G/L- Clinisol 10% (39.6 g/day)  ? Dextrose: 21 % (151.2 g/day) ? Lipids: 18 g/L (12.96 g/day)   ? kCal: 802/day  Electrolytes in TPN: 36mEq/L of Na, 10 mEq/L of K, 6mEq/L of Ca, 30mEq/L of Mg, and 81mmol/L of Phos. Cl:Ac ratio 1:1  continue to add 10 units insulin inTPN  Add standard MVI and trace elements to TPN daily  continue sensitive SSI at every 6 hour intervals  Monitor TPN labs on Mon/Thurs, BMP in am 12/26   [name], PharmD, BCPS Clinical Pharmacist 03/17/2020 7:07 AM

## 2020-03-17 NOTE — Progress Notes (Signed)
PROGRESS NOTE    Margaret Black  GUY:[id number] DOB: [birth year] DOA: 03/01/2020 PCP: Olin Hauser, DO   Brief Narrative: Taken from prior notes. Margaret Raynes Leederis a 79 y.o.femalewith medical history significant forsignificant forGERD, COPD, lung cancer status post evaluation for radiation therapy, hypothyroidism, SLE andstage IV chronic kidney disease who presentedto the hospital for an outpatient upper and lower endoscopy after evaluation for unintentional weight loss and poor appetite. Admission was requested because patient complained of worsening lower abdominal pain for 2 days.  Found to have diverticulitis with abscesses, history of multiple prior diverticulitis s/p colectomy and colostomy bag placement. Patient was started on TPN. Postoperative colitis. CT scan on 12/21 with no new abnormality. NG tube was clamped on 03/15/2020. Completed 2-week course of Zosyn. Started her on clear liquid diet today.  Subjective: Patient started getting feces in her colostomy bag, bag was changed twice overnight.  Able to tolerate clear liquid well, no nausea or vomiting. She was in quite good spirits today.  Assessment & Plan:   Principal Problem:   Diverticulitis of large intestine with abscess without bleeding Active Problems:   GERD   Acute kidney injury superimposed on CKD (HCC)   Essential hypertension   Anemia in chronic kidney disease   Protein-calorie malnutrition, severe   Hypothyroidism   SBO (small bowel obstruction) (HCC)  acute recurrent diverticulitis/postoperative ileus. Patient was taken to the OR for left hemicolectomy and colostomy by surgery. Developed postoperative Ileus requiring NG tube and TPN.  Completed a 2-week course of Zosyn. Patient started having feces in her colostomy bag.  In good spirit.  No problem with clear liquid.  Taking MiraLAX as recommended by surgery. -Advance her diet to full liquid. -NG tube was removed by surgery. -Start tapering  TPN  AKI with CKD stage IIIb. AKI resolved. Creatinine around baseline now. -Continue to monitor -Avoid nephrotoxins  Essential hypertension/hyperaldosteronism. Blood pressure within goal. -Continue with Bystolic, nifedipine and Aldactone.  Hypothyroidism. -Continue with Synthroid.  Anxiety.  Patient with worsening anxiety for fear of unknown.  Requesting little more than Xanax, so it was replaced with Ativan which seems working better for her. -Continue Ativan every 4 hourly as needed.  GERD. -Continue with Protonix.  Anemia of chronic disease. Hemoglobin seems stable. -Continue to monitor  Severe protein calorie malnutrition and weight loss.  -Nutrition Status: Nutrition Problem: Severe Malnutrition Etiology: chronic illness (COPD, CKD, inadequate oral intake related to diverticulitis since 12/2019) Signs/Symptoms: energy intake < or equal to 75% for > or equal to 1 month,percent weight loss,severe fat depletion,moderate muscle depletion,severe muscle depletion Percent weight loss: 13.4 % Interventions: Refer to RD note for recommendations  -on TPN -Just started on full liquid.  Hyperglycemia suspected due to TPN -- continue IV insulin via TPN. No prior diagnosis of diabetes.   Objective: Vitals:   03/16/20 2013 03/17/20 0014 03/17/20 0027 03/17/20 0424  BP: (!) 129/49  (!) 141/57 (!) 137/59  Pulse: 69  76 74  Resp: 16  18 16   Temp: 98.5 F (36.9 C)  97.7 F (36.5 C) 98.6 F (37 C)  TempSrc: Oral Oral Oral Oral  SpO2: 98%  100% 98%  Weight:      Height:        Intake/Output Summary (Last 24 hours) at 03/17/2020 0825 Last data filed at 03/17/2020 5638 Gross per 24 hour  Intake 1189.67 ml  Output 400 ml  Net 789.67 ml   Filed Weights   03/07/20 0500 03/11/20 0500 03/12/20 0500  Weight:  57.5 kg 57 kg 57 kg    Examination:  General.  Well-developed lady, in no acute distress. Pulmonary.  Lungs clear bilaterally, normal respiratory effort. CV.   Regular rate and rhythm, no JVD, rub or murmur. Abdomen.  Soft, nontender, nondistended, BS positive.  Colostomy bag full of feces. CNS.  Alert and oriented x3.  No focal neurologic deficit. Extremities.  No edema, no cyanosis, pulses intact and symmetrical. Psychiatry.  Judgment and insight appears normal.  DVT prophylaxis: Lovenox Code Status: DNR Family Communication: Discussed with daughter on phone. Disposition Plan:  Status is: Inpatient  Remains inpatient appropriate because:Inpatient level of care appropriate due to severity of illness   Dispo: The patient is from: Home              Anticipated d/c is to: Home with HH.              Anticipated d/c date is: 3 days              Patient currently is not medically stable to d/c.   Consultants:   Surgery  Procedures:  Colectomy with colostomy.  Antimicrobials:   Data Reviewed: I have personally reviewed following labs and imaging studies  CBC: Recent Labs  Lab 03/11/20 0538 03/12/20 1029  WBC 12.9* 11.1*  NEUTROABS  --  9.2*  HGB 8.1* 7.6*  HCT 24.5* 23.4*  MCV 97.6 97.5  PLT 499* 751*   Basic Metabolic Panel: Recent Labs  Lab 03/12/20 0150 03/12/20 1029 03/14/20 0430 03/15/20 0351 03/16/20 0618  NA 138 142 142 139 137  K 3.6 3.6 3.7 4.6 3.6  CL 104 109 105 107 103  CO2 25 26 28 29 26   GLUCOSE 234* 180* 150* 230* 214*  BUN 30* 29* 28* 29* 27*  CREATININE 1.44* 1.34* 1.30* 1.38* 1.09*  CALCIUM 8.4* 8.5* 8.2* 8.5* 8.6*  MG 1.8  --  2.1 2.0  --   PHOS  --  2.5  --  3.2  --    GFR: Estimated Creatinine Clearance: 37.7 mL/min (A) (by C-G formula based on SCr of 1.09 mg/dL (H)). Liver Function Tests: Recent Labs  Lab 03/12/20 0150 03/12/20 1029 03/15/20 0351  AST 23 20 72*  ALT 29 28 97*  ALKPHOS 182* 159* 167*  BILITOT 0.8 0.8 0.5  PROT 5.4* 5.4* 5.4*  ALBUMIN 1.8* 1.8* 1.8*   No results for input(s): LIPASE, AMYLASE in the last 168 hours. No results for input(s): AMMONIA in the last 168  hours. Coagulation Profile: No results for input(s): INR, PROTIME in the last 168 hours. Cardiac Enzymes: No results for input(s): CKTOTAL, CKMB, CKMBINDEX, TROPONINI in the last 168 hours. BNP (last 3 results) No results for input(s): PROBNP in the last 8760 hours. HbA1C: No results for input(s): HGBA1C in the last 72 hours. CBG: Recent Labs  Lab 03/16/20 0522 03/16/20 1227 03/16/20 1502 03/17/20 0019 03/17/20 0632  GLUCAP 190* 195* 209* 179* 200*   Lipid Profile: No results for input(s): CHOL, HDL, LDLCALC, TRIG, CHOLHDL, LDLDIRECT in the last 72 hours. Thyroid Function Tests: No results for input(s): TSH, T4TOTAL, FREET4, T3FREE, THYROIDAB in the last 72 hours. Anemia Panel: No results for input(s): VITAMINB12, FOLATE, FERRITIN, TIBC, IRON, RETICCTPCT in the last 72 hours. Sepsis Labs: No results for input(s): PROCALCITON, LATICACIDVEN in the last 168 hours.  Recent Results (from the past 240 hour(s))  Anaerobic culture     Status: None   Collection Time: 03/07/20  5:37 PM   Specimen: Abdomen;  Wound  Result Value Ref Range Status   Specimen Description   Final    ABDOMEN Performed at [facility], [address] [place], [place] [address]    Special Requests   Final    NONE Performed at [facility], [address] [place], [place] [address]    Gram Stain   Final    ABUNDANT WBC PRESENT, PREDOMINANTLY MONONUCLEAR RARE GRAM VARIABLE ROD    Culture   Final    NO ANAEROBES ISOLATED Performed at [facility], [place] [address], [place], [place] [address]    Report Status 03/13/2020 FINAL  Final  Aerobic Culture (superficial specimen)     Status: None   Collection Time: 03/07/20  5:47 PM   Specimen: Wound  Result Value Ref Range Status   Specimen Description WOUND  Final   Special Requests NONE  Final   Gram Stain   Final    FEW WBC PRESENT,BOTH PMN AND MONONUCLEAR NO ORGANISMS SEEN Performed at [facility], [address] [place], [place] [address]    Culture RARE ESCHERICHIA COLI  Final   Report Status 03/10/2020 FINAL  Final   Organism ID, Bacteria ESCHERICHIA COLI  Final      Susceptibility   Escherichia coli - MIC*    AMPICILLIN >=32 RESISTANT Resistant     CEFAZOLIN 8 SENSITIVE Sensitive     CEFEPIME <=0.12 SENSITIVE Sensitive     CEFTAZIDIME <=1 SENSITIVE Sensitive     CEFTRIAXONE <=0.25 SENSITIVE Sensitive     CIPROFLOXACIN >=4 RESISTANT Resistant     GENTAMICIN <=1 SENSITIVE Sensitive     IMIPENEM <=0.25 SENSITIVE Sensitive     TRIMETH/SULFA <=20 SENSITIVE Sensitive     AMPICILLIN/SULBACTAM >=32 RESISTANT Resistant     PIP/TAZO <=4 SENSITIVE Sensitive     * RARE ESCHERICHIA COLI     Radiology Studies: No results found.  Scheduled Meds: . Chlorhexidine Gluconate Cloth  6 each Topical Daily  . enoxaparin (LOVENOX) injection  40 mg Subcutaneous Q24H  . insulin aspart  0-9 Units Subcutaneous Q6H  . levothyroxine  88 mcg Oral Q0600  . LORazepam  0.5 mg Oral QHS  . metoCLOPramide (REGLAN) injection  5 mg Intravenous Q8H  . nebivolol  5 mg Oral Q2000  . NIFEdipine  60 mg Oral Q2000  . nystatin  5 mL Oral QID  . pantoprazole (PROTONIX) IV  40 mg Intravenous Q12H  . polyethylene glycol  17 g Oral BID  . spironolactone  12.5 mg Oral Daily   Continuous Infusions: . sodium chloride Stopped (03/11/20 0206)  . TPN ADULT (ION) 65 mL/hr at 03/16/20 1844     LOS: 16 days   Time spent: 35 minutes.  Lorella Nimrod, MD [facility]  If 7PM-7AM, please contact night-coverage [email or web address]  03/17/2020, 8:25 AM   This record has been created using Systems analyst. Errors have been sought and corrected,but may not always be located. Such creation errors do not reflect on the standard of care.

## 2020-03-17 NOTE — Progress Notes (Signed)
[place] Hospital Day(s): 16.   Post op day(s): 10 Days Post-Op.   Interval History: Patient seen and examined, no acute events or new complaints overnight. Patient reports feeling well.  Patient feels very happy that she passed stool through the ostomy.  Today in a great mood.  No abdominal pain.  No nausea or vomiting.  Vital signs in last 24 hours: [min-max] current  Temp:  [97.6 F (36.4 C)-98.6 F (37 C)] 98.6 F (37 C) (12/25 0424) Pulse Rate:  [69-79] 74 (12/25 0424) Resp:  [16-20] 16 (12/25 0424) BP: (129-141)/(49-59) 137/59 (12/25 0424) SpO2:  [97 %-100 %] 98 % (12/25 0424)     Height: 5\' 5"  (165.1 cm) Weight: 57 kg BMI (Calculated): 20.91   Physical Exam:  Constitutional: alert, cooperative and no distress  Respiratory: breathing non-labored at rest  Cardiovascular: regular rate and sinus rhythm  Gastrointestinal: soft, non-tender, and non-distended.  Wound is dry and clean.  Ostomy pink and patent.  Abundant amount of stool and gas in the ostomy bag.  Labs:  CBC Latest Ref Rng & Units 03/12/2020 03/11/2020 03/08/2020  WBC 4.0 - 10.5 K/uL 11.1(H) 12.9(H) 15.4(H)  Hemoglobin 12.0 - 15.0 g/dL 7.6(L) 8.1(L) 9.7(L)  Hematocrit 36.0 - 46.0 % 23.4(L) 24.5(L) 30.5(L)  Platelets 150 - 400 K/uL 505(H) 499(H) 414(H)   CMP Latest Ref Rng & Units 03/16/2020 03/15/2020 03/14/2020  Glucose 70 - 99 mg/dL 214(H) 230(H) 150(H)  BUN 8 - 23 mg/dL 27(H) 29(H) 28(H)  Creatinine 0.44 - 1.00 mg/dL 1.09(H) 1.38(H) 1.30(H)  Sodium 135 - 145 mmol/L 137 139 142  Potassium 3.5 - 5.1 mmol/L 3.6 4.6 3.7  Chloride 98 - 111 mmol/L 103 107 105  CO2 22 - 32 mmol/L 26 29 28   Calcium 8.9 - 10.3 mg/dL 8.6(L) 8.5(L) 8.2(L)  Total Protein 6.5 - 8.1 g/dL - 5.4(L) -  Total Bilirubin 0.3 - 1.2 mg/dL - 0.5 -  Alkaline Phos 38 - 126 U/L - 167(H) -  AST 15 - 41 U/L - 72(H) -  ALT 0 - 44 U/L - 97(H) -    Imaging studies: No new pertinent imaging studies   Assessment/Plan:  79  y.o.femalewith acute diverticulitis with abscess and perforation10Day Post-Ops/p partial colectomy and end colostomy, complicated by pertinent comorbidities includinganxiety disorder, chronic kidney disease, COPD, hypertension, hypothyroidism, irritable bowel syndrome, history of lung cancer, lupus.  Patient today with stool in the ostomy bag finally.  Patient is very happy about it.  There is no abdominal pain, nausea or vomiting.  I remove the NGT.  I advance her diet to full liquids.  I will discuss with pharmacy to start weaning TPN.  Patient ambulating adequately.  Appreciate hospitalist management of medical comorbidities.  Arnold Long, MD

## 2020-03-17 NOTE — Plan of Care (Signed)
Continuing with plan of care. 

## 2020-03-18 DIAGNOSIS — K5720 Diverticulitis of large intestine with perforation and abscess without bleeding: Secondary | ICD-10-CM | POA: Diagnosis not present

## 2020-03-18 LAB — GLUCOSE, CAPILLARY: Glucose-Capillary: 137 mg/dL — ABNORMAL HIGH (ref 70–99)

## 2020-03-18 NOTE — Progress Notes (Signed)
[place] Hospital Day(s): 17.   Post op day(s): 11 Days Post-Op.   Interval History: Patient seen and examined, no acute events or new complaints overnight. Patient reports feeling nervous about the new things are happening such as passing stool through the back.  Patient denies any nausea or vomiting.  Patient denies any abdominal pain.  Patient passing a lot of stool and gas.   Vital signs in last 24 hours: [min-max] current  Temp:  [98.4 F (36.9 C)-98.5 F (36.9 C)] 98.4 F (36.9 C) (12/26 0323) Pulse Rate:  [77-83] 77 (12/26 0323) Resp:  [16-20] 20 (12/26 0323) BP: (130-144)/(54-66) 130/54 (12/26 0323) SpO2:  [99 %-100 %] 99 % (12/26 0323)     Height: 5\' 5"  (165.1 cm) Weight: 57 kg BMI (Calculated): 20.91   Physical Exam:  Constitutional: alert, cooperative and no distress  Respiratory: breathing non-labored at rest  Cardiovascular: regular rate and sinus rhythm  Gastrointestinal: soft, non-tender, and non-distended. Ostomy pink and patent.   Labs:  CBC Latest Ref Rng & Units 03/12/2020 03/11/2020 03/08/2020  WBC 4.0 - 10.5 K/uL 11.1(H) 12.9(H) 15.4(H)  Hemoglobin 12.0 - 15.0 g/dL 7.6(L) 8.1(L) 9.7(L)  Hematocrit 36.0 - 46.0 % 23.4(L) 24.5(L) 30.5(L)  Platelets 150 - 400 K/uL 505(H) 499(H) 414(H)   CMP Latest Ref Rng & Units 03/16/2020 03/15/2020 03/14/2020  Glucose 70 - 99 mg/dL 214(H) 230(H) 150(H)  BUN 8 - 23 mg/dL 27(H) 29(H) 28(H)  Creatinine 0.44 - 1.00 mg/dL 1.09(H) 1.38(H) 1.30(H)  Sodium 135 - 145 mmol/L 137 139 142  Potassium 3.5 - 5.1 mmol/L 3.6 4.6 3.7  Chloride 98 - 111 mmol/L 103 107 105  CO2 22 - 32 mmol/L 26 29 28   Calcium 8.9 - 10.3 mg/dL 8.6(L) 8.5(L) 8.2(L)  Total Protein 6.5 - 8.1 g/dL - 5.4(L) -  Total Bilirubin 0.3 - 1.2 mg/dL - 0.5 -  Alkaline Phos 38 - 126 U/L - 167(H) -  AST 15 - 41 U/L - 72(H) -  ALT 0 - 44 U/L - 97(H) -    Imaging studies: No new pertinent imaging studies   Assessment/Plan:  79 y.o.femalewith  acute diverticulitis with abscess and perforation11Day Post-Ops/p partial colectomy and end colostomy, complicated by pertinent comorbidities includinganxiety disorder, chronic kidney disease, COPD, hypertension, hypothyroidism, irritable bowel syndrome, history of lung cancer, lupus.  Patient recovering adequately.  She continue passing stool through the ostomy.  I will advance her diet to soft diet.  Plan is to wean and discontinue TPN as per protocol. Patient ambulating. If she tolerates soft diet will start coordinating discharge planning tomorrow hoping for discharge during the week.   Arnold Long, MD

## 2020-03-18 NOTE — Progress Notes (Signed)
PT Cancellation Note  Patient Details Name: Margaret Black MRN: [id number] DOB: 21-May-1940   Cancelled Treatment:    Reason Eval/Treat Not Completed: Other (comment) Chart reviewed & attempted to see pt for PT tx. Pt received in bed but does not recall working with this therapist a couple times since admission. Pt declines participation at this time, reporting she will ambulate with her daughter later today but would like PT services to come back later this week if possible.   Lavone Nian, PT, DPT 03/18/20, 3:29 PM    Waunita Schooner 03/18/2020, 3:28 PM

## 2020-03-18 NOTE — Progress Notes (Signed)
PROGRESS NOTE    Margaret Black  UTM:[id number] DOB: [birth year] DOA: 03/01/2020 PCP: Olin Hauser, DO   Brief Narrative: Taken from prior notes. Margaret Black a 79 y.o.femalewith medical history significant forsignificant forGERD, COPD, lung cancer status post evaluation for radiation therapy, hypothyroidism, SLE andstage IV chronic kidney disease who presentedto the hospital for an outpatient upper and lower endoscopy after evaluation for unintentional weight loss and poor appetite. Admission was requested because patient complained of worsening lower abdominal pain for 2 days.  Found to have diverticulitis with abscesses, history of multiple prior diverticulitis s/p colectomy and colostomy bag placement. Patient was started on TPN. Postoperative colitis. CT scan on 12/21 with no new abnormality. NG tube was clamped on 03/15/2020, and removed on 03/17/2020. Completed 2-week course of Zosyn. Tolerating soft diet now.  Subjective: Patient was feeling better when seen today.  Continues to have feces in her colostomy bag.  Denies any nausea or vomiting.  NG tube was discontinued yesterday.  Tolerating full liquid diet well.  Assessment & Plan:   Principal Problem:   Diverticulitis of large intestine with abscess without bleeding Active Problems:   GERD   Acute kidney injury superimposed on CKD (HCC)   Essential hypertension   Anemia in chronic kidney disease   Protein-calorie malnutrition, severe   Hypothyroidism   SBO (small bowel obstruction) (HCC)  acute recurrent diverticulitis/postoperative ileus. Patient was taken to the OR for left hemicolectomy and colostomy by surgery. Developed postoperative Ileus requiring NG tube and TPN.  Completed a 2-week course of Zosyn. Patient started having feces in her colostomy bag.  Started showing progressive signs of improvement by tolerating full liquid diet.  No nausea or vomiting. -Start her on soft diet -Discontinue TPN  according to protocol.  AKI with CKD stage IIIb. AKI resolved. Creatinine around baseline now. -Continue to monitor -Avoid nephrotoxins  Essential hypertension/hyperaldosteronism. Blood pressure within goal. -Continue with Bystolic, nifedipine and Aldactone.  Hypothyroidism. -Continue with Synthroid.  Anxiety.  Patient with worsening anxiety for fear of unknown.  Requesting little more than Xanax, so it was replaced with Ativan which seems working better for her. -Continue Ativan every 4 hourly as needed.  GERD. -Continue with Protonix.  Anemia of chronic disease. Hemoglobin seems stable. -Continue to monitor  Severe protein calorie malnutrition and weight loss.  -Nutrition Status: Nutrition Problem: Severe Malnutrition Etiology: chronic illness (COPD, CKD, inadequate oral intake related to diverticulitis since 12/2019) Signs/Symptoms: energy intake < or equal to 75% for > or equal to 1 month,percent weight loss,severe fat depletion,moderate muscle depletion,severe muscle depletion Percent weight loss: 13.4 % Interventions: Refer to RD note for recommendations  Advancing diet and tapering TPN.  Hyperglycemia suspected due to TPN -- continue IV insulin via TPN. No prior diagnosis of diabetes.   Objective: Vitals:   03/17/20 1349 03/17/20 1641 03/17/20 2120 03/18/20 0323  BP: (!) 140/58 (!) 144/66 (!) 137/58 (!) 130/54  Pulse: 78 83 78 77  Resp: 16 16 20 20   Temp: 98.5 F (36.9 C)  98.5 F (36.9 C) 98.4 F (36.9 C)  TempSrc: Oral  Oral Oral  SpO2: 100%  99% 99%  Weight:      Height:        Intake/Output Summary (Last 24 hours) at 03/18/2020 0848 Last data filed at 03/18/2020 0550 Gross per 24 hour  Intake 1796.45 ml  Output 1700 ml  Net 96.45 ml   Filed Weights   03/07/20 0500 03/11/20 0500 03/12/20 0500  Weight: 57.5 kg  57 kg 57 kg    Examination:  General.  Well-developed elderly lady, in no acute distress. Pulmonary.  Lungs clear bilaterally, normal  respiratory effort. CV.  Regular rate and rhythm, no JVD, rub or murmur. Abdomen.  Soft, nontender, nondistended, BS positive.,  Ostomy bag with feces. CNS.  Alert and oriented x3.  No focal neurologic deficit. Extremities.  No edema, no cyanosis, pulses intact and symmetrical. Psychiatry.  Judgment and insight appears normal.  DVT prophylaxis: Lovenox Code Status: DNR Family Communication: Discussed with daughter at nursing station. Disposition Plan:  Status is: Inpatient  Remains inpatient appropriate because:Inpatient level of care appropriate due to severity of illness   Dispo: The patient is from: Home              Anticipated d/c is to: Home with HH.              Anticipated d/c date is: 1-2 days              Patient currently is not medically stable to d/c.   Consultants:   Surgery  Procedures:  Colectomy with colostomy.  Antimicrobials:   Data Reviewed: I have personally reviewed following labs and imaging studies  CBC: Recent Labs  Lab 03/12/20 1029  WBC 11.1*  NEUTROABS 9.2*  HGB 7.6*  HCT 23.4*  MCV 97.5  PLT 588*   Basic Metabolic Panel: Recent Labs  Lab 03/12/20 0150 03/12/20 1029 03/14/20 0430 03/15/20 0351 03/16/20 0618  NA 138 142 142 139 137  K 3.6 3.6 3.7 4.6 3.6  CL 104 109 105 107 103  CO2 25 26 28 29 26   GLUCOSE 234* 180* 150* 230* 214*  BUN 30* 29* 28* 29* 27*  CREATININE 1.44* 1.34* 1.30* 1.38* 1.09*  CALCIUM 8.4* 8.5* 8.2* 8.5* 8.6*  MG 1.8  --  2.1 2.0  --   PHOS  --  2.5  --  3.2  --    GFR: Estimated Creatinine Clearance: 37.7 mL/min (A) (by C-G formula based on SCr of 1.09 mg/dL (H)). Liver Function Tests: Recent Labs  Lab 03/12/20 0150 03/12/20 1029 03/15/20 0351  AST 23 20 72*  ALT 29 28 97*  ALKPHOS 182* 159* 167*  BILITOT 0.8 0.8 0.5  PROT 5.4* 5.4* 5.4*  ALBUMIN 1.8* 1.8* 1.8*   No results for input(s): LIPASE, AMYLASE in the last 168 hours. No results for input(s): AMMONIA in the last 168  hours. Coagulation Profile: No results for input(s): INR, PROTIME in the last 168 hours. Cardiac Enzymes: No results for input(s): CKTOTAL, CKMB, CKMBINDEX, TROPONINI in the last 168 hours. BNP (last 3 results) No results for input(s): PROBNP in the last 8760 hours. HbA1C: No results for input(s): HGBA1C in the last 72 hours. CBG: Recent Labs  Lab 03/16/20 1502 03/17/20 0019 03/17/20 0632 03/17/20 1348 03/18/20 0541  GLUCAP 209* 179* 200* 129* 137*   Lipid Profile: No results for input(s): CHOL, HDL, LDLCALC, TRIG, CHOLHDL, LDLDIRECT in the last 72 hours. Thyroid Function Tests: No results for input(s): TSH, T4TOTAL, FREET4, T3FREE, THYROIDAB in the last 72 hours. Anemia Panel: No results for input(s): VITAMINB12, FOLATE, FERRITIN, TIBC, IRON, RETICCTPCT in the last 72 hours. Sepsis Labs: No results for input(s): PROCALCITON, LATICACIDVEN in the last 168 hours.  No results found for this or any previous visit (from the past 240 hour(s)).   Radiology Studies: No results found.  Scheduled Meds: . Chlorhexidine Gluconate Cloth  6 each Topical Daily  . enoxaparin (LOVENOX) injection  40 mg Subcutaneous Q24H  . insulin aspart  0-9 Units Subcutaneous Q6H  . levothyroxine  88 mcg Oral Q0600  . LORazepam  0.5 mg Oral QHS  . metoCLOPramide (REGLAN) injection  5 mg Intravenous Q12H  . nebivolol  5 mg Oral Q2000  . NIFEdipine  60 mg Oral Q2000  . nystatin  5 mL Oral QID  . pantoprazole (PROTONIX) IV  40 mg Intravenous Q12H  . polyethylene glycol  17 g Oral BID  . spironolactone  12.5 mg Oral Daily   Continuous Infusions: . sodium chloride Stopped (03/11/20 0206)  . TPN ADULT (ION) 30 mL/hr at 03/17/20 1812     LOS: 17 days   Time spent: 30 minutes.  Lorella Nimrod, MD [facility]  If 7PM-7AM, please contact night-coverage [email or web address]  03/18/2020, 8:48 AM   This record has been created using Systems analyst. Errors have been sought and  corrected,but may not always be located. Such creation errors do not reflect on the standard of care.

## 2020-03-18 NOTE — Progress Notes (Signed)
Pt refused MN fs and also VS

## 2020-03-18 NOTE — Progress Notes (Signed)
OT Cancellation Note  Patient Details Name: Margaret Black MRN: [id number] DOB: 06-10-1940   Cancelled Treatment:    Reason Eval/Treat Not Completed: Patient declined, no reason specified. Pt has politely and repeatedly declined mobility with OT across multiple dates. Upon arrival pt laying in bed, reports she ambulates with daughter and is adapting to her new situation managing colostomy bag. Pt is agreeable to [facility] services upon d/c as OT has recommended, but requesting to d/c in house OT at this time. Pt educated on role of OT and importance of mobility/ADL participation for functional strengthening. Pt educated on requesting RN to order OT if pt changes her mind and wants to participate prior to d/c. Will sign off at this time. Please re-consult if new needs arise. Thank you.  Dessie Coma, M.S. OTR/L  03/18/20, 4:08 PM  ascom [phone]

## 2020-03-18 NOTE — Plan of Care (Signed)
Continuing with plan of care. 

## 2020-03-19 ENCOUNTER — Other Ambulatory Visit: Payer: Self-pay | Admitting: Family Medicine

## 2020-03-19 DIAGNOSIS — K5720 Diverticulitis of large intestine with perforation and abscess without bleeding: Secondary | ICD-10-CM | POA: Diagnosis not present

## 2020-03-19 LAB — COMPREHENSIVE METABOLIC PANEL
ALT: 92 U/L — ABNORMAL HIGH (ref 0–44)
AST: 38 U/L (ref 15–41)
Albumin: 2.2 g/dL — ABNORMAL LOW (ref 3.5–5.0)
Alkaline Phosphatase: 166 U/L — ABNORMAL HIGH (ref 38–126)
Anion gap: 6 (ref 5–15)
BUN: 18 mg/dL (ref 8–23)
CO2: 26 mmol/L (ref 22–32)
Calcium: 8.5 mg/dL — ABNORMAL LOW (ref 8.9–10.3)
Chloride: 99 mmol/L (ref 98–111)
Creatinine, Ser: 1.14 mg/dL — ABNORMAL HIGH (ref 0.44–1.00)
GFR, Estimated: 49 mL/min — ABNORMAL LOW (ref >60)
Glucose, Bld: 99 mg/dL (ref 70–99)
Potassium: 3.2 mmol/L — ABNORMAL LOW (ref 3.5–5.1)
Sodium: 131 mmol/L — ABNORMAL LOW (ref 135–145)
Total Bilirubin: 0.7 mg/dL (ref 0.3–1.2)
Total Protein: 5.8 g/dL — ABNORMAL LOW (ref 6.5–8.1)

## 2020-03-19 LAB — CBC
HCT: 23.5 % — ABNORMAL LOW (ref 36.0–46.0)
Hemoglobin: 7.8 g/dL — ABNORMAL LOW (ref 12.0–15.0)
MCH: 32.5 pg (ref 26.0–34.0)
MCHC: 33.2 g/dL (ref 30.0–36.0)
MCV: 97.9 fL (ref 80.0–100.0)
Platelets: 542 10*3/uL — ABNORMAL HIGH (ref 150–400)
RBC: 2.4 MIL/uL — ABNORMAL LOW (ref 3.87–5.11)
RDW: 15.9 % — ABNORMAL HIGH (ref 11.5–15.5)
WBC: 6.6 10*3/uL (ref 4.0–10.5)
nRBC: 0 % (ref 0.0–0.2)

## 2020-03-19 LAB — RETICULOCYTES
Immature Retic Fract: 27.6 % — ABNORMAL HIGH (ref 2.3–15.9)
RBC.: 2.78 MIL/uL — ABNORMAL LOW (ref 3.87–5.11)
Retic Count, Absolute: 131.5 10*3/uL (ref 19.0–186.0)
Retic Ct Pct: 4.7 % — ABNORMAL HIGH (ref 0.4–3.1)

## 2020-03-19 LAB — FERRITIN: Ferritin: 553 ng/mL — ABNORMAL HIGH (ref 11–307)

## 2020-03-19 LAB — IRON AND TIBC
Iron: 29 ug/dL (ref 28–170)
Saturation Ratios: 10 % — ABNORMAL LOW (ref 10.4–31.8)
TIBC: 287 ug/dL (ref 250–450)
UIBC: 258 ug/dL

## 2020-03-19 LAB — FOLATE: Folate: 18.9 ng/mL (ref >5.9)

## 2020-03-19 LAB — GLUCOSE, CAPILLARY: Glucose-Capillary: 92 mg/dL (ref 70–99)

## 2020-03-19 MED ORDER — SPIRONOLACTONE 25 MG PO TABS: ORAL_TABLET | ORAL | Status: DC

## 2020-03-19 MED ORDER — ENSURE MAX PROTEIN PO LIQD
11.0000 [oz_av] | Freq: Every day | ORAL | Status: DC
  Administered 2020-03-20: 11 [oz_av] via ORAL
  Filled 2020-03-19: qty 330

## 2020-03-19 MED ORDER — INSULIN ASPART 100 UNIT/ML ~~LOC~~ SOLN: 0.0000 [IU] | Freq: Three times a day (TID) | SUBCUTANEOUS | Status: DC

## 2020-03-19 MED ORDER — POTASSIUM CHLORIDE CRYS ER 20 MEQ PO TBCR
40.0000 meq | EXTENDED_RELEASE_TABLET | Freq: Once | ORAL | Status: AC
  Administered 2020-03-19: 40 meq via ORAL
  Filled 2020-03-19: qty 2

## 2020-03-19 MED ORDER — ADULT MULTIVITAMIN W/MINERALS CH
1.0000 | ORAL_TABLET | Freq: Every day | ORAL | Status: DC
  Administered 2020-03-20: 1 via ORAL
  Filled 2020-03-19: qty 1

## 2020-03-19 MED ORDER — POLYETHYLENE GLYCOL 3350 17 G PO PACK
17.0000 g | PACK | Freq: Every day | ORAL | Status: DC
  Administered 2020-03-19 – 2020-03-20 (×2): 17 g via ORAL
  Filled 2020-03-19 (×2): qty 1

## 2020-03-19 NOTE — Progress Notes (Signed)
Nutrition Follow Up Note   DOCUMENTATION CODES:   Severe malnutrition in context of chronic illness  INTERVENTION:   Ensure Max protein supplement daily, each supplement provides 150kcal and 30g of protein.  Vital Cuisine BID, each supplement provides 520kcal and 22g of protein.   MVI daily   Snacks   Colostomy diet education   NUTRITION DIAGNOSIS:   Severe Malnutrition related to chronic illness (COPD, CKD, inadequate oral intake related to diverticulitis since 12/2019) as evidenced by energy intake < or equal to 75% for > or equal to 1 month, 13.4% weight loss over the past 4 months,severe fat depletion,moderate muscle depletion,severe muscle depletion. Ongoing.  GOAL:   Patient will meet greater than or equal to 90% of their needs -previously met with TPN, progressing with po intake  MONITOR:   PO intake,Supplement acceptance,Labs,Weight trends,Skin,I & O's  ASSESSMENT:   79 year old female with medical history significant for GERD, HLD, HTN, collagen vascular disease, COPD, lung cancer s/p lobectomy 2001 (recently s/p XRT 11/2019),uternie cancer s/p hysterectomy 2001, osteopenia, hyperaldosteronism, Lupus, IBS, retinal vein occulusion of left eye,  hypothyroidism, SLE, CKD stage IV and grade II diastolic dysfunction who is admitted with acute diverticulitis now s/p Hartmann's 12/15   TPN discontinued. Pt advanced to a soft diet on 12/26. Met with pt and pt's daughter in room today. Pt in good spirits today and is receptive to diet information. Pt reports that her appetite is slowly improving; pt documented to have eaten 100% of her lunch today. Pt has been eating smaller meals as she reports that she gets full after just a few bites. Pt reports that Ensure and Boost both give her diarrhea but she is open to trying some different kinds of supplements. RD discussed with pt the importance of adequate nutrition needed to preserve lean muscle. RD will add supplements to meal trays  and add snacks between meals to help pt meet her estimated needs. Per chart, pt is down 4lbs since admit; this is not significant. RD provided pt with colostomy diet education today. RD provided "Colostomy Nutrition Therapy" handout from the [employer].   Medications reviewed and include: lovenox, synthroid, protonix, aldactone  Labs reviewed: Na 131(L), K 3.2(L), creat 1.14(H), alk phos 166(H), ALT 92(H) Hgb 7.8(L), Hct 23.5(L) cbgs- 129, 137, 92 x 24 hrs  Diet Order:    Diet Order            DIET SOFT Room service appropriate? Yes; Fluid consistency: Thin  Diet effective now                EDUCATION NEEDS:   No education needs have been identified at this time  Skin:  Skin Assessment: Reviewed RN Assessment  Last BM:  12/27- 759m via ostomy  Height:   Ht Readings from Last 1 Encounters:  03/01/20 5' 5"  (1.651 m)   Weight:   Wt Readings from Last 1 Encounters:  03/19/20 55.3 kg   Ideal Body Weight:  56.8 kg  BMI:  Body mass index is 20.29 kg/m.  Estimated Nutritional Needs:   Kcal:  [address] Protein:  80-95 grams  Fluid:  >/= 1.3 L  CKoleen DistanceMS, RD, LDN Please refer to [facility]for RD and/or RD on-call/weekend/after hours pager

## 2020-03-19 NOTE — Progress Notes (Signed)
[facility] Day(s): 18.   Post op day(s): 12 Days Post-Op.   Interval History: Patient seen and examined, no acute events or new complaints overnight. Patient reports feeling well.  She denies nausea or vomiting.  She denies any abdominal pain.  Vital signs in last 24 hours: [min-max] current  Temp:  [98 F (36.7 C)-98.8 F (37.1 C)] 98 F (36.7 C) (12/27 1710) Pulse Rate:  [70-73] 73 (12/27 1710) Resp:  [14-16] 16 (12/27 1710) BP: (113-132)/(43-52) 113/46 (12/27 1710) SpO2:  [100 %] 100 % (12/27 1710) Weight:  [55.3 kg] 55.3 kg (12/27 1534)     Height: 5\' 5"  (165.1 cm) Weight: 55.3 kg BMI (Calculated): 20.29   Physical Exam:  Constitutional: alert, cooperative and no distress  Respiratory: breathing non-labored at rest  Cardiovascular: regular rate and sinus rhythm  Gastrointestinal: soft, non-tender, and non-distended.  Colostomy pink and patent  Labs:  CBC Latest Ref Rng & Units 03/19/2020 03/12/2020 03/11/2020  WBC 4.0 - 10.5 K/uL 6.6 11.1(H) 12.9(H)  Hemoglobin 12.0 - 15.0 g/dL 7.8(L) 7.6(L) 8.1(L)  Hematocrit 36.0 - 46.0 % 23.5(L) 23.4(L) 24.5(L)  Platelets 150 - 400 K/uL 542(H) 505(H) 499(H)   CMP Latest Ref Rng & Units 03/19/2020 03/16/2020 03/15/2020  Glucose 70 - 99 mg/dL 99 214(H) 230(H)  BUN 8 - 23 mg/dL 18 27(H) 29(H)  Creatinine 0.44 - 1.00 mg/dL 1.14(H) 1.09(H) 1.38(H)  Sodium 135 - 145 mmol/L 131(L) 137 139  Potassium 3.5 - 5.1 mmol/L 3.2(L) 3.6 4.6  Chloride 98 - 111 mmol/L 99 103 107  CO2 22 - 32 mmol/L 26 26 29   Calcium 8.9 - 10.3 mg/dL 8.5(L) 8.6(L) 8.5(L)  Total Protein 6.5 - 8.1 g/dL 5.8(L) - 5.4(L)  Total Bilirubin 0.3 - 1.2 mg/dL 0.7 - 0.5  Alkaline Phos 38 - 126 U/L 166(H) - 167(H)  AST 15 - 41 U/L 38 - 72(H)  ALT 0 - 44 U/L 92(H) - 97(H)    Imaging studies: No new pertinent imaging studies   Assessment/Plan:  79 y.o.femalewith acute diverticulitis with abscess and perforation12Day Post-Ops/p partial colectomy  and end colostomy, complicated by pertinent comorbidities includinganxiety disorder, chronic kidney disease, COPD, hypertension, hypothyroidism, irritable bowel syndrome, history of lung cancer, lupus.  Patient recovering adequately.  Today tolerating soft diet.  Continue passing stool through the colostomy.  We'll continue medical management.  Continue ambulation.  We'll coordinate for possible discharge planning for tomorrow.  No new changes on medical therapy today.  We'll continue to follow closely.  Remove stitches tomorrow.  Arnold Long, MD

## 2020-03-19 NOTE — Telephone Encounter (Signed)
Holding Spironolactone until out of hospital will determine when / if restart  [name], [facility] 03/19/2020, 4:51 PM

## 2020-03-19 NOTE — Telephone Encounter (Signed)
[facility] for Sp[facility]olactone --it is on hold by hospital.

## 2020-03-19 NOTE — Progress Notes (Signed)
PROGRESS NOTE    Margaret Black  LEX:[id number] DOB: 1940/05/11 DOA: 03/01/2020 PCP: Olin Hauser, DO   Brief Narrative: Taken from prior notes. Margaret Black a 79 y.o.femalewith medical history significant forsignificant forGERD, COPD, lung cancer status post evaluation for radiation therapy, hypothyroidism, SLE andstage IV chronic kidney disease who presentedto the hospital for an outpatient upper and lower endoscopy after evaluation for unintentional weight loss and poor appetite. Admission was requested because patient complained of worsening lower abdominal pain for 2 days.  Found to have diverticulitis with abscesses, history of multiple prior diverticulitis s/p colectomy and colostomy bag placement. Patient was started on TPN. Postoperative colitis. CT scan on 12/21 with no new abnormality. NG tube was clamped on 03/15/2020, and removed on 03/17/2020. Completed 2-week course of Zosyn. Tolerating soft diet now.  Subjective: Patient denies any pain when seen today.  Able to tolerate soft diet.  No nausea or vomiting.  Having feces in colostomy bag. TPN has been discontinued.  Assessment & Plan:   Principal Problem:   Diverticulitis of large intestine with abscess without bleeding Active Problems:   GERD   Acute kidney injury superimposed on CKD (HCC)   Essential hypertension   Anemia in chronic kidney disease   Protein-calorie malnutrition, severe   Hypothyroidism   SBO (small bowel obstruction) (HCC)  acute recurrent diverticulitis/postoperative ileus. Patient was taken to the OR for left hemicolectomy and colostomy by surgery. Developed postoperative Ileus requiring NG tube and TPN.  Completed a 2-week course of Zosyn. Patient started having feces in her colostomy bag.  Started showing progressive signs of improvement by tolerating soft diet.  No nausea or vomiting.  TPN has been discontinued.  Able to ambulate. -Continue with soft diet  Hypokalemia.   Calcium at 3.2. -Replete potassium and monitor.  AKI with CKD stage IIIb. AKI resolved. Creatinine around baseline now. -Continue to monitor -Avoid nephrotoxins  Essential hypertension/hyperaldosteronism. Blood pressure within goal. -Continue with Bystolic, nifedipine and Aldactone.  Hypothyroidism. -Continue with Synthroid.  Anxiety.  Patient with worsening anxiety for fear of unknown.  Requesting little more than Xanax, so it was replaced with Ativan which seems working better for her. -Continue Ativan every 4 hourly as needed.  GERD. -Continue with Protonix.  Anemia of chronic disease. Hemoglobin seems stable. -Check anemia panel -Continue to monitor  Severe protein calorie malnutrition and weight loss.  -Nutrition Status: Nutrition Problem: Severe Malnutrition Etiology: chronic illness (COPD, CKD, inadequate oral intake related to diverticulitis since 12/2019) Signs/Symptoms: energy intake < or equal to 75% for > or equal to 1 month,percent weight loss,severe fat depletion,moderate muscle depletion,severe muscle depletion Percent weight loss: 13.4 % Interventions: Refer to RD note for recommendations  -Encourage p.o. intake.  Hyperglycemia suspected due to TPN Resolved after discontinuation of TPN.  Objective: Vitals:   03/18/20 1632 03/18/20 1948 03/19/20 0800 03/19/20 1119  BP: (!) 131/55 (!) 113/43 (!) 132/52 (!) 128/49  Pulse: 76 72  70  Resp: 16 14  16   Temp: 98.5 F (36.9 C) 98.2 F (36.8 C)  98.8 F (37.1 C)  TempSrc: Oral Oral  Oral  SpO2: 98% 100%  100%  Weight:      Height:        Intake/Output Summary (Last 24 hours) at 03/19/2020 1421 Last data filed at 03/19/2020 1300 Gross per 24 hour  Intake 351.82 ml  Output 700 ml  Net -348.18 ml   Filed Weights   03/07/20 0500 03/11/20 0500 03/12/20 0500  Weight: 57.5 kg  57 kg 57 kg    Examination:  General.  Well-developed elderly lady, in no acute distress. Pulmonary.  Lungs clear  bilaterally, normal respiratory effort. CV.  Regular rate and rhythm, no JVD, rub or murmur. Abdomen.  Soft, nontender, nondistended, BS positive.  Colostomy bag with feces. CNS.  Alert and oriented x3.  No focal neurologic deficit. Extremities.  No edema, no cyanosis, pulses intact and symmetrical. Psychiatry.  Judgment and insight appears normal.  DVT prophylaxis: Lovenox Code Status: DNR Family Communication: Discussed with patient. Disposition Plan:  Status is: Inpatient  Remains inpatient appropriate because:Inpatient level of care appropriate due to severity of illness   Dispo: The patient is from: Home              Anticipated d/c is to: Home with HH.              Anticipated d/c date is: 1-2 days              Patient currently is not medically stable to d/c.   Consultants:   Surgery  Procedures:  Colectomy with colostomy.  Antimicrobials:   Data Reviewed: I have personally reviewed following labs and imaging studies  CBC: Recent Labs  Lab 03/19/20 0827  WBC 6.6  HGB 7.8*  HCT 23.5*  MCV 97.9  PLT 242*   Basic Metabolic Panel: Recent Labs  Lab 03/14/20 0430 03/15/20 0351 03/16/20 0618 03/19/20 0827  NA 142 139 137 131*  K 3.7 4.6 3.6 3.2*  CL 105 107 103 99  CO2 28 29 26 26   GLUCOSE 150* 230* 214* 99  BUN 28* 29* 27* 18  CREATININE 1.30* 1.38* 1.09* 1.14*  CALCIUM 8.2* 8.5* 8.6* 8.5*  MG 2.1 2.0  --   --   PHOS  --  3.2  --   --    GFR: Estimated Creatinine Clearance: 36 mL/min (A) (by C-G formula based on SCr of 1.14 mg/dL (H)). Liver Function Tests: Recent Labs  Lab 03/15/20 0351 03/19/20 0827  AST 72* 38  ALT 97* 92*  ALKPHOS 167* 166*  BILITOT 0.5 0.7  PROT 5.4* 5.8*  ALBUMIN 1.8* 2.2*   No results for input(s): LIPASE, AMYLASE in the last 168 hours. No results for input(s): AMMONIA in the last 168 hours. Coagulation Profile: No results for input(s): INR, PROTIME in the last 168 hours. Cardiac Enzymes: No results for input(s):  CKTOTAL, CKMB, CKMBINDEX, TROPONINI in the last 168 hours. BNP (last 3 results) No results for input(s): PROBNP in the last 8760 hours. HbA1C: No results for input(s): HGBA1C in the last 72 hours. CBG: Recent Labs  Lab 03/17/20 0019 03/17/20 0632 03/17/20 1348 03/18/20 0541 03/19/20 0745  GLUCAP 179* 200* 129* 137* 92   Lipid Profile: No results for input(s): CHOL, HDL, LDLCALC, TRIG, CHOLHDL, LDLDIRECT in the last 72 hours. Thyroid Function Tests: No results for input(s): TSH, T4TOTAL, FREET4, T3FREE, THYROIDAB in the last 72 hours. Anemia Panel: No results for input(s): VITAMINB12, FOLATE, FERRITIN, TIBC, IRON, RETICCTPCT in the last 72 hours. Sepsis Labs: No results for input(s): PROCALCITON, LATICACIDVEN in the last 168 hours.  No results found for this or any previous visit (from the past 240 hour(s)).   Radiology Studies: No results found.  Scheduled Meds: . Chlorhexidine Gluconate Cloth  6 each Topical Daily  . enoxaparin (LOVENOX) injection  40 mg Subcutaneous Q24H  . levothyroxine  88 mcg Oral Q0600  . LORazepam  0.5 mg Oral QHS  . nebivolol  5 mg  Oral Q2000  . NIFEdipine  60 mg Oral Q2000  . nystatin  5 mL Oral QID  . pantoprazole (PROTONIX) IV  40 mg Intravenous Q12H  . polyethylene glycol  17 g Oral Daily  . spironolactone  12.5 mg Oral Daily   Continuous Infusions: . sodium chloride Stopped (03/11/20 0206)     LOS: 18 days   Time spent: 30 minutes.  Lorella Nimrod, MD [facility]  If 7PM-7AM, please contact night-coverage [email or web address]  03/19/2020, 2:21 PM   This record has been created using Systems analyst. Errors have been sought and corrected,but may not always be located. Such creation errors do not reflect on the standard of care.

## 2020-03-19 NOTE — Progress Notes (Signed)
Physical Therapy Treatment Patient Details Name: Margaret Black MRN: [id number] DOB: 06/01/1940 Today's Date: 03/19/2020    History of Present Illness Pt is a 79 y/o F who presented to the hospital on 03/01/20 for endoscopy 2/2 unintentional weight loss & poor appetite. CT scan of abdomen and pelvis done without contrast showed persistent evidence of diverticulitis. CT on 03/06/20 revealed developing abcess & SBO. Pt underwent surgery on 03/07/20 for partial colectomy & colostomy. PMH: COPD, lung CA, SLE, Stage IV CKD, anxiety, L eye visual impairments, osteopenia, uterine CA, thyroid CA    PT Comments    Pt seen for PT treatment with daughter present for first half of session. Pt requires max encouragement from PT & daughter for participation with pt reluctantly agreeable. Pt is able to complete stand pivot to [facility] with supervision without assistance & with continent void on BSC. PT educates daughter Butch Penny) on how to adjust RW to proper height for pt, as well as need to remove or secure throw rugs & ensure clear pathways in the home. Pt ambulates 2 laps around nurses station with RW & supervision. Pt reports she doesn't like using AD & PT educates pt on benefits of using AD (increased endurance & improved balance thus reducing fall risk). PT educates pt on side stepping within RW in case pt encounters too small of area to ambulate forwards with RW. Pt appreciative of education.     Follow Up Recommendations  Home health PT;Supervision for mobility/OOB     Equipment Recommendations  Rolling walker with 5" wheels;3in1 (PT)    Recommendations for Other Services       Precautions / Restrictions Precautions Precautions: Fall Restrictions Weight Bearing Restrictions: No    Mobility  Bed Mobility Overal bed mobility: Modified Independent;Needs Assistance Bed Mobility: Sit to Supine       Sit to supine: Modified independent (Device/Increase time);HOB elevated      Transfers Overall  transfer level: Needs assistance   Transfers: Sit to/from Stand Sit to Stand: Modified independent (Device/Increase time)            Ambulation/Gait Ambulation/Gait assistance: Supervision Gait Distance (Feet): 350 Feet Assistive device: Rolling walker (2 wheeled) Gait Pattern/deviations: Decreased stride length Gait velocity: decreased       Stairs             Wheelchair Mobility    Modified Rankin (Stroke Patients Only)       Balance Overall balance assessment: Needs assistance         Standing balance support: No upper extremity supported Standing balance-Leahy Scale: Fair Standing balance comment: close supervision standing without UE support                            Cognition Arousal/Alertness: Awake/alert Behavior During Therapy: WFL for tasks assessed/performed Overall Cognitive Status: Within Functional Limits for tasks assessed                                        Exercises      General Comments        Pertinent Vitals/Pain Pain Assessment: Faces Faces Pain Scale: No hurt    Home Living                      Prior Function  PT Goals (current goals can now be found in the care plan section) Acute Rehab PT Goals Patient Stated Goal: get better PT Goal Formulation: With patient/family Time For Goal Achievement: 03/25/20 Potential to Achieve Goals: Good Progress towards PT goals: Progressing toward goals    Frequency    Min 2X/week      PT Plan Current plan remains appropriate    Co-evaluation              AM-PAC PT "6 Clicks" Mobility   Outcome Measure  Help needed turning from your back to your side while in a flat bed without using bedrails?: None Help needed moving from lying on your back to sitting on the side of a flat bed without using bedrails?: None Help needed moving to and from a bed to a chair (including a wheelchair)?: None Help needed standing up  from a chair using your arms (e.g., wheelchair or bedside chair)?: None Help needed to walk in hospital room?: A Little Help needed climbing 3-5 steps with a railing? : A Little 6 Click Score: 22    End of Session   Activity Tolerance: Patient tolerated treatment well Patient left: in bed;with bed alarm set;with call bell/phone within reach   PT Visit Diagnosis: Unsteadiness on feet (R26.81);Muscle weakness (generalized) (M62.81)     Time: 4163-8453 PT Time Calculation (min) (ACUTE ONLY): 16 min  Charges:  $Therapeutic Activity: 8-22 mins                     Lavone Nian, PT, DPT 03/19/20, 4:28 PM    Waunita Schooner 03/19/2020, 4:25 PM

## 2020-03-19 NOTE — Care Management Important Message (Signed)
Important Message  Patient Details  Name: TRINIDAD INGLE MRN: [id number] Date of Birth: 1940/08/23   Medicare Important Message Given:  Yes     Dannette Barbara 03/19/2020, 12:13 PM

## 2020-03-20 DIAGNOSIS — K5720 Diverticulitis of large intestine with perforation and abscess without bleeding: Secondary | ICD-10-CM | POA: Diagnosis not present

## 2020-03-20 LAB — BASIC METABOLIC PANEL
Anion gap: 8 (ref 5–15)
BUN: 17 mg/dL (ref 8–23)
CO2: 24 mmol/L (ref 22–32)
Calcium: 8.4 mg/dL — ABNORMAL LOW (ref 8.9–10.3)
Chloride: 102 mmol/L (ref 98–111)
Creatinine, Ser: 1.23 mg/dL — ABNORMAL HIGH (ref 0.44–1.00)
GFR, Estimated: 45 mL/min — ABNORMAL LOW (ref >60)
Glucose, Bld: 82 mg/dL (ref 70–99)
Potassium: 3.9 mmol/L (ref 3.5–5.1)
Sodium: 134 mmol/L — ABNORMAL LOW (ref 135–145)

## 2020-03-20 LAB — VITAMIN B12: Vitamin B-12: 712 pg/mL (ref 180–914)

## 2020-03-20 MED ORDER — ADULT MULTIVITAMIN W/MINERALS CH: 1.0000 | ORAL_TABLET | Freq: Every day | ORAL | 1 refills | Status: AC

## 2020-03-20 MED ORDER — POLYETHYLENE GLYCOL 3350 17 G PO PACK: 17.0000 g | PACK | Freq: Every day | ORAL | 0 refills | Status: AC | PRN

## 2020-03-20 MED ORDER — ENSURE MAX PROTEIN PO LIQD: 11.0000 [oz_av] | Freq: Every day | ORAL | Status: AC

## 2020-03-20 MED ORDER — SPIRONOLACTONE 25 MG PO TABS: 12.5000 mg | ORAL_TABLET | Freq: Every day | ORAL | 0 refills | Status: DC

## 2020-03-20 MED ORDER — FERROUS SULFATE 325 (65 FE) MG PO TABS
325.0000 mg | ORAL_TABLET | Freq: Two times a day (BID) | ORAL | Status: DC
  Administered 2020-03-20: 325 mg via ORAL

## 2020-03-20 MED ORDER — FERROUS SULFATE 325 (65 FE) MG PO TABS: 325.0000 mg | ORAL_TABLET | Freq: Two times a day (BID) | ORAL | 3 refills | Status: AC

## 2020-03-20 NOTE — TOC Transition Note (Signed)
Transition of Care [facility]) - CM/SW Discharge Note   Patient Details  Name: Margaret Black MRN: [id number] Date of Birth: Jul 25, 1940  [facility] CM/SW Contact:  Beverly Sessions, RN Phone Number: 03/20/2020, 12:43 PM   Clinical Narrative:    Patient to discharge home today [facility] notified DME was delivered to room last week Daughter to transport at discharge Bed side RN to ensure that patient has ostomy supplies at discharge    Final next level of care: [place] Barriers to Discharge: No Barriers Identified   Patient Goals and CMS Choice        Discharge Placement                       Discharge Plan and Services                DME Arranged: [place] DME Agency: [employer] Date DME Agency Contacted: 03/15/20   Representative spoke with at DME Agency: Mardene Celeste [facility] Arranged: RN,PT,OT,Nurse's Aide [facility] Agency: [place] Date [place]: 03/20/20   Representative spoke with at [place]: [place] ([place]) Interventions     Readmission Risk Interventions Readmission Risk Prevention Plan 03/16/2020 03/02/2020  Transportation Screening - Complete  PCP or Specialist Appt within 3-5 Days - Complete  Social Work Consult for [place] Planning/Counseling - Complete  Palliative Care Screening - Not Applicable  Medication Review (RN Care Manager) - Complete  HRI or Home Care Consult Complete -  [place] Not Applicable -  Some recent data might be hidden

## 2020-03-20 NOTE — Addendum Note (Signed)
Encounter addended by: Kathyrn Drown, RN on: 03/20/2020 2:40 PM  Actions taken: Charge Capture section accepted

## 2020-03-20 NOTE — Discharge Summary (Signed)
Physician Discharge Summary  MARLINA CATALDI LNL:[id number] DOB: 07/29/1940 DOA: 03/01/2020  PCP: Olin Hauser, DO  Admit date: 03/01/2020 Discharge date: 03/20/2020  Admitted From: Home Disposition: Home   Recommendations for Outpatient Follow-up:  1. Follow up with PCP in 1-2 weeks 2. Follow-up with general surgery 3. Please obtain CMP/CBC in one week 4. Please follow up on the following pending results: None  Home Health: Yes Equipment/Devices: Rolling walker, ostomy supplies Discharge Condition: Stable CODE STATUS: DNR Diet recommendation: Heart Healthy   Brief/Interim Summary: Margaret Black a 79 y.o.femalewith medical history significant forsignificant forGERD, COPD, lung cancer status post evaluation for radiation therapy, hypothyroidism, SLE andstage IV chronic kidney disease who presentedto the hospital for an outpatient upper and lower endoscopy after evaluation for unintentional weight loss and poor appetite. Admission was requested because patient complained of worsening lower abdominal pain for 2 days.  Found to have diverticulitis with abscesses, history of multiple prior diverticulitis, Patient was taken to the OR by general surgery for colectomy and colostomy placement.  Tolerated the procedure well.  Developed postoperative ileus requiring NG tube and TPN for a long time.  Slowly improved and TPN was discontinued.  She was able to tolerate soft diet well on discharge. She was having feces in her colostomy bag for the past couple of days before discharge.  Patient received 2 weeks of Zosyn while in the hospital.  Multiple electrolyte abnormalities which were corrected as needed.  Patient had anemia of chronic disease along with some iron deficiency and was started on iron supplement.  Patient was also diagnosed with severe protein calorie malnutrition.  Dietitian was consulted and patient was advised to take supplement once she was off from TPN and will  follow up with her primary care provider for further management.  We will follow up with general surgery as an outpatient.  Staples were removed on the day of discharge.  Discharge Diagnoses:  Principal Problem:   Diverticulitis of large intestine with abscess without bleeding Active Problems:   GERD   Acute kidney injury superimposed on CKD (HCC)   Essential hypertension   Anemia in chronic kidney disease   Protein-calorie malnutrition, severe   Hypothyroidism   SBO (small bowel obstruction) (HCC)   Discharge Instructions  Discharge Instructions    Diet - low sodium heart healthy   Complete by: As directed    Discharge instructions   Complete by: As directed    It was pleasure taking care of you. Please keep yourself well-hydrated and advance your diet slowly. You can use MiraLAX as needed for constipation. Continue with the rest of the medications, be mindful that some of them has been changed. Follow-up with your surgeon and primary care provider.   Increase activity slowly   Complete by: As directed    No dressing needed   Complete by: As directed      Allergies as of 03/20/2020      Reactions   Lactase Diarrhea   Phenergan [promethazine]    Atenolol Rash   Latex Rash      Medication List    STOP taking these medications   sucralfate 1 g tablet Commonly known as: Carafate     TAKE these medications   acidophilus Caps capsule 2 capsules daily (okay to substitute any probiotic)   Bystolic 5 MG tablet Generic drug: nebivolol Take 1 tablet (5 mg total) by mouth daily.   CALCIUM-VITAMIN D PO Take 1 tablet by mouth daily.   enalapril 5  MG tablet Commonly known as: VASOTEC Take 5 mg by mouth at bedtime.   Ensure Max Protein Liqd Take 330 mLs (11 oz total) by mouth daily. Start taking on: March 21, 2020   ferrous sulfate 325 (65 FE) MG tablet Take 1 tablet (325 mg total) by mouth 2 (two) times daily with a meal.   hydrocortisone 2.5 % rectal  cream Commonly known as: ANUSOL-HC Place rectally 3 (three) times daily.   lansoprazole 15 MG capsule Commonly known as: PREVACID Take 1 capsule (15 mg total) by mouth daily before breakfast.   LORazepam 0.5 MG tablet Commonly known as: ATIVAN Take 1 tablet (0.5 mg total) by mouth at bedtime.   multivitamin tablet Take 1 tablet by mouth daily.   multivitamin with minerals Tabs tablet Take 1 tablet by mouth daily. Start taking on: March 21, 2020   NIFEdipine 60 MG 24 hr tablet Commonly known as: PROCARDIA XL/NIFEDICAL XL TAKE 1 TABLET EVERY EVENING What changed:   when to take this  additional instructions   ondansetron 4 MG disintegrating tablet Commonly known as: Zofran ODT Take 1 tablet (4 mg total) by mouth every 8 (eight) hours as needed for nausea or vomiting.   polyethylene glycol 17 g packet Commonly known as: MIRALAX / GLYCOLAX Take 17 g by mouth daily as needed for mild constipation or moderate constipation.   spironolactone 25 MG tablet Commonly known as: ALDACTONE Take 0.5 tablets (12.5 mg total) by mouth daily. What changed:   how much to take  how to take this  when to take this  additional instructions   Synthroid 88 MCG tablet Generic drug: levothyroxine Take 1 tablet (88 mcg total) by mouth daily before breakfast.   vitamin C 500 MG tablet Commonly known as: ASCORBIC ACID Take 500 mg by mouth daily.            Durable Medical Equipment  (From admission, onward)         Start     Ordered   03/15/20 1054  For home use only DME Bedside commode  Once       Question:  Patient needs a bedside commode to treat with the following condition  Answer:  Weakness   03/15/20 1053   03/15/20 1053  For home use only DME Walker rolling  Once       Question Answer Comment  Walker: With [place]   Patient needs a walker to treat with the following condition Weakness      03/15/20 1053           Discharge Care Instructions  (From  admission, onward)         Start     Ordered   03/20/20 0000  No dressing needed        03/20/20 1008          Follow-up Information    Olin Hauser, DO. Schedule an appointment as soon as possible for a visit.   Specialty: Family Medicine Contact information: [address] [place] [address] [phone]              Allergies  Allergen Reactions  . Lactase Diarrhea  . Phenergan [Promethazine]   . Atenolol Rash  . Latex Rash    Consultations:  General surgery  Procedures/Studies: CT ABDOMEN PELVIS WO CONTRAST  Result Date: 03/01/2020 CLINICAL DATA:  Left lower quadrant pain. Clinical suspicion of diverticulitis. Patient scheduled for colonoscopy today. EXAM: CT ABDOMEN AND PELVIS WITHOUT CONTRAST  TECHNIQUE: Multidetector CT imaging of the abdomen and pelvis was performed following the standard protocol without IV contrast. COMPARISON:  01/07/2020 FINDINGS: Lower chest: No change in a 12 mm by 4 mm pulmonary nodule in the left lower lobe. Areas of scarring at the lung bases are unchanged. Coronary artery calcification and aortic atherosclerotic calcification are noted. Hepatobiliary: Previous cholecystectomy. Cyst in the right lobe of the liver as seen previously. No acute liver finding. Pancreas: Normal Spleen: Normal Adrenals/Urinary Tract: Adrenal glands are normal. Kidneys are normal. No cyst, mass, stone or hydronephrosis. Bladder is normal. Stomach/Bowel: Considerable amount of intestinal fluid presumably related to the colon prep. There is persistent evidence diverticulitis at the proximal sigmoid colon, with regional inflammatory changes that appear worse compared with the study of 01/07/2020. Few mildly dilated small bowel loops in that region could represent a localized ileus secondary to the inflammatory changes. No evidence of drainable abscess. No free intraperitoneal fluid or air. Vascular/Lymphatic: Aortic atherosclerosis. No aneurysm. IVC is normal.  Aortic branch vessel atherosclerosis. No adenopathy. Reproductive: Previous hysterectomy.  No pelvic mass. Other: None Musculoskeletal: Ordinary lower lumbar degenerative changes. IMPRESSION: 1. Persistent evidence of diverticulitis at the proximal sigmoid colon, with regional inflammatory changes that appear worse compared with the study of 01/07/2020. No evidence of drainable abscess. Few mildly dilated small bowel loops in that region could represent a localized ileus secondary to the inflammatory changes. 2. Considerable amount of intestinal fluid presumably related to the colon prep. 3. Aortic atherosclerosis. Coronary artery calcification. 4. No change in a 12 mm by 4 mm pulmonary nodule in the left lower lobe. Aortic Atherosclerosis (ICD10-I70.0). Electronically Signed   By: Nelson Chimes M.D.   On: 03/01/2020 11:29   DG Abd 1 View  Result Date: 03/13/2020 CLINICAL DATA:  Nasogastric tube placement EXAM: ABDOMEN - 1 VIEW COMPARISON:  Abdominal CT from 7 days ago FINDINGS: Enteric tube with tip near the GE junction and side-port over the lower esophagus. Need 7 cm of advancement to place the side port at the GE junction. Gas distended small and large bowel seen in the upper abdomen. Postoperative left lower lobe. Normal heart size IMPRESSION: Enteric tube with side port over the lower esophagus. 7 cm of advancement would be needed to place the side port near the GE junction. Electronically Signed   By: Monte Fantasia M.D.   On: 03/13/2020 04:36   CT ABDOMEN PELVIS W CONTRAST  Result Date: 03/13/2020 CLINICAL DATA:  Status post partial colectomy on 12/15 with colostomy, severe right abdominal pain EXAM: CT ABDOMEN AND PELVIS WITH CONTRAST TECHNIQUE: Multidetector CT imaging of the abdomen and pelvis was performed using the standard protocol following bolus administration of intravenous contrast. CONTRAST:  34mL OMNIPAQUE IOHEXOL 300 MG/ML  SOLN COMPARISON:  03/06/2020 FINDINGS: Lower chest: Trace  right pleural effusion. Hepatobiliary: 12 mm cyst in the posterior right hepatic lobe (series 2/image 21). Additional subcentimeter probable cysts. Status post cholecystectomy. Mild intrahepatic and extrahepatic ductal dilatation. Dilated common duct, measuring 14 mm, although smoothly tapering at the ampulla. Pancreas: Within normal limits. Spleen: Within normal limits. Adrenals/Urinary Tract: Adrenal glands are within normal limits. Kidneys are within normal limits.  No hydronephrosis. Bladder is within normal limits. Stomach/Bowel: Enteric tube terminates at the GE junction. Stomach is within normal limits. Status post left hemicolectomy with left mid abdominal colostomy. Hartmann's pouch with distal sigmoid diverticulosis, without evidence of diverticulitis. No evidence of bowel obstruction. Appendix is not discretely visualized. Vascular/Lymphatic: No evidence of abdominal aortic aneurysm. Atherosclerotic  calcifications of the abdominal aorta and branch vessels. No suspicious abdominopelvic lymphadenopathy. Reproductive: Status post hysterectomy. No adnexal masses. Other: Trace mesenteric fluid/stranding. Right mid abdominal surgical drain terminates in the left lower pelvis. Prior multiloculated fluid collection/abscess in the left lower pelvis has resolved. No free air. Musculoskeletal: Midline skin staples with postsurgical changes along the anterior abdominal wall. Visualized osseous structures are within normal limits. IMPRESSION: Status post left hemicolectomy with left mid abdominal colostomy. Prior multiloculated fluid collection/abscess in the left lower pelvis has resolved. Surgical drain terminates in the left lower pelvis. No free air. Enteric tube terminates at the GE junction. Electronically Signed   By: Julian Hy M.D.   On: 03/13/2020 11:51   CT ABDOMEN PELVIS W CONTRAST  Result Date: 03/06/2020 CLINICAL DATA:  Acute recurrent diverticulitis. Diverticulitis, complication suspected.  EXAM: CT ABDOMEN AND PELVIS WITH CONTRAST TECHNIQUE: Multidetector CT imaging of the abdomen and pelvis was performed using the standard protocol following bolus administration of intravenous contrast. CONTRAST:  21mL OMNIPAQUE IOHEXOL 300 MG/ML  SOLN COMPARISON:  CT of the abdomen and pelvis 03/01/2020 and 01/07/2020 FINDINGS: Lower chest: Bilateral pleural effusions are now present, right greater than left. Mild dependent atelectasis present bilaterally. Centrilobular emphysematous changes are again noted. Heart size is normal. No significant pericardial effusion is present. Artery calcifications are again noted. Hepatobiliary: Progressive intra and extrahepatic biliary dilation is noted. Common bile duct measures up to 12.5 mm. Obstructing lesion is present. Patient is status post cholecystectomy. Simple cyst in the periphery of the right lobe of the liver measures 10 mm. Pancreas: Mild pancreatic duct dilation is stable. No discrete lesion is present. Mass lesion is present. Spleen: Normal in size without focal abnormality. Adrenals/Urinary Tract: Adrenal glands are normal bilaterally. Kidneys and ureters are within normal limits. No obstruction or stone is present. No mass lesion is present. The urinary bladder is within normal limits. Stomach/Bowel: Stomach is mildly distended. Density in the duodenum may represent tablet. Proximal small bowel is dilated. Fluid levels are present. Loops of small bowel measure up to 4.2 cm. Point of obstruction is centered in the pelvis associated with an extraluminal fluid collection. More distal small bowel is collapsed. The ascending and transverse colon are within normal limits. Diverticular changes are present in the descending colon. More prominent diverticular changes are present in the sigmoid colon is previously seen with inflammatory changes, now associated with the extraluminal collection. Vascular/Lymphatic: Atherosclerotic calcifications are present in the aorta  and branch vessels without aneurysm. Reproductive: Prostate is unremarkable. Other: Extraluminal loculated fluid collection with air-fluid levels adjacent to the inflamed sigmoid colon measures 2.9 x 6.8 x 3.5 cm maximally. The collection is not accessible via a percutaneous route due to overlying bowel. Musculoskeletal: Vertebral body heights and alignment are normal. Bony pelvis is within limits. Hips are located and within normal limits. IMPRESSION: 1. Extraluminal loculated fluid collection with air-fluid levels adjacent to the inflamed sigmoid colon measures 2.9 x 6.8 x 3.5 cm maximally. The collection is not accessible via a percutaneous route due to overlying bowel. This is consistent with a developing abscess. 2. Small bowel obstruction with transition point centered in the pelvis associated with the extraluminal fluid collection. 3. Progressive intra and extrahepatic biliary dilation without an obstructing lesion. 4. Bilateral pleural effusions, right greater than left. 5. Aortic Atherosclerosis (ICD10-I70.0) and Emphysema (ICD10-J43.9). Electronically Signed   By: San Morelle M.D.   On: 03/06/2020 13:56   [place] EKG SITE RITE  Result Date: 03/03/2020 If Site [place]  not attached, placement could not be confirmed due to current cardiac rhythm.    Subjective: Patient was seen and examined during morning rounds.  No new complaint.  No nausea or vomiting.  Able to tolerate soft diet well.  Excited to go home after prolonged hospitalization.  Colostomy bag with feces.  Discharge Exam: Vitals:   03/20/20 0648 03/20/20 0800  BP: (!) 111/44 125/61  Pulse: 77 77  Resp: 18 20  Temp: 98.1 F (36.7 C) 97.9 F (36.6 C)  SpO2: 100% 100%   Vitals:   03/19/20 2100 03/20/20 0444 03/20/20 0648 03/20/20 0800  BP: 127/62  (!) 111/44 125/61  Pulse: 77  77 77  Resp: 18  18 20   Temp: 98 F (36.7 C)  98.1 F (36.7 C) 97.9 F (36.6 C)  TempSrc: Oral   Oral  SpO2: 98%  100% 100%  Weight:   55.4 kg    Height:        General: Pt is alert, awake, not in acute distress Cardiovascular: RRR, S1/S2 +, no rubs, no gallops Respiratory: CTA bilaterally, no wheezing, no rhonchi Abdominal: Soft, NT, ND, bowel sounds +, colostomy bag intact and with feces. Extremities: no edema, no cyanosis   The results of significant diagnostics from this hospitalization (including imaging, microbiology, ancillary and laboratory) are listed below for reference.    Microbiology: No results found for this or any previous visit (from the past 240 hour(s)).   Labs: BNP (last 3 results) No results for input(s): BNP in the last 8760 hours. Basic Metabolic Panel: Recent Labs  Lab 03/14/20 0430 03/15/20 0351 03/16/20 0618 03/19/20 0827 03/20/20 0654  NA 142 139 137 131* 134*  K 3.7 4.6 3.6 3.2* 3.9  CL 105 107 103 99 102  CO2 28 29 26 26 24   GLUCOSE 150* 230* 214* 99 82  BUN 28* 29* 27* 18 17  CREATININE 1.30* 1.38* 1.09* 1.14* 1.23*  CALCIUM 8.2* 8.5* 8.6* 8.5* 8.4*  MG 2.1 2.0  --   --   --   PHOS  --  3.2  --   --   --    Liver Function Tests: Recent Labs  Lab 03/15/20 0351 03/19/20 0827  AST 72* 38  ALT 97* 92*  ALKPHOS 167* 166*  BILITOT 0.5 0.7  PROT 5.4* 5.8*  ALBUMIN 1.8* 2.2*   No results for input(s): LIPASE, AMYLASE in the last 168 hours. No results for input(s): AMMONIA in the last 168 hours. CBC: Recent Labs  Lab 03/19/20 0827  WBC 6.6  HGB 7.8*  HCT 23.5*  MCV 97.9  PLT 542*   Cardiac Enzymes: No results for input(s): CKTOTAL, CKMB, CKMBINDEX, TROPONINI in the last 168 hours. BNP: Invalid input(s): POCBNP CBG: Recent Labs  Lab 03/17/20 0019 03/17/20 0632 03/17/20 1348 03/18/20 0541 03/19/20 0745  GLUCAP 179* 200* 129* 137* 92   D-Dimer No results for input(s): DDIMER in the last 72 hours. Hgb A1c No results for input(s): HGBA1C in the last 72 hours. Lipid Profile No results for input(s): CHOL, HDL, LDLCALC, TRIG, CHOLHDL, LDLDIRECT in the last  72 hours. Thyroid function studies No results for input(s): TSH, T4TOTAL, T3FREE, THYROIDAB in the last 72 hours.  Invalid input(s): FREET3 Anemia work up Recent Labs    03/19/20 1438  VITAMINB12 712  FOLATE 18.9  FERRITIN 553*  TIBC 287  IRON 29  RETICCTPCT 4.7*   Urinalysis    Component Value Date/Time   COLORURINE YELLOW (A) 03/01/2020 1504   APPEARANCEUR  HAZY (A) 03/01/2020 1504   LABSPEC 1.010 03/01/2020 1504   PHURINE 5.0 03/01/2020 1504   GLUCOSEU NEGATIVE 03/01/2020 1504   HGBUR NEGATIVE 03/01/2020 1504   HGBUR moderate 06/28/2007 1131   BILIRUBINUR NEGATIVE 03/01/2020 1504   BILIRUBINUR negative 02/03/2018 1148   KETONESUR 5 (A) 03/01/2020 1504   PROTEINUR NEGATIVE 03/01/2020 1504   UROBILINOGEN 0.2 02/03/2018 1148   UROBILINOGEN 0.2 09/29/2011 2001   NITRITE NEGATIVE 03/01/2020 1504   LEUKOCYTESUR TRACE (A) 03/01/2020 1504   Sepsis Labs Invalid input(s): PROCALCITONIN,  WBC,  LACTICIDVEN Microbiology No results found for this or any previous visit (from the past 240 hour(s)).  Time coordinating discharge: Over 30 minutes  SIGNED:  Lorella Nimrod, MD  [facility] 03/20/2020, 10:09 AM  If 7PM-7AM, please contact night-coverage [email or web address]  This record has been created using Systems analyst. Errors have been sought and corrected,but may not always be located. Such creation errors do not reflect on the standard of care.

## 2020-03-20 NOTE — Progress Notes (Signed)
Patient is ready for discharge. Written and verbal discharge instructions provided and explained. All questions answered. Daughter Butch Penny) said that they have enough appliance for colostomy. PICC line removed - skin, dry and intact. Escorted to pts transportation via wheelchair.

## 2020-03-22 DIAGNOSIS — I051 Rheumatic mitral insufficiency: Secondary | ICD-10-CM | POA: Diagnosis not present

## 2020-03-22 DIAGNOSIS — N179 Acute kidney failure, unspecified: Secondary | ICD-10-CM | POA: Diagnosis not present

## 2020-03-22 DIAGNOSIS — M359 Systemic involvement of connective tissue, unspecified: Secondary | ICD-10-CM | POA: Diagnosis not present

## 2020-03-22 DIAGNOSIS — K5790 Diverticulosis of intestine, part unspecified, without perforation or abscess without bleeding: Secondary | ICD-10-CM | POA: Diagnosis not present

## 2020-03-22 DIAGNOSIS — M47816 Spondylosis without myelopathy or radiculopathy, lumbar region: Secondary | ICD-10-CM | POA: Diagnosis not present

## 2020-03-22 DIAGNOSIS — G4700 Insomnia, unspecified: Secondary | ICD-10-CM | POA: Diagnosis not present

## 2020-03-22 DIAGNOSIS — F419 Anxiety disorder, unspecified: Secondary | ICD-10-CM | POA: Diagnosis not present

## 2020-03-22 DIAGNOSIS — M8580 Other specified disorders of bone density and structure, unspecified site: Secondary | ICD-10-CM | POA: Diagnosis not present

## 2020-03-22 DIAGNOSIS — I272 Pulmonary hypertension, unspecified: Secondary | ICD-10-CM | POA: Diagnosis not present

## 2020-03-22 DIAGNOSIS — K7689 Other specified diseases of liver: Secondary | ICD-10-CM | POA: Diagnosis not present

## 2020-03-22 DIAGNOSIS — Z433 Encounter for attention to colostomy: Secondary | ICD-10-CM | POA: Diagnosis not present

## 2020-03-22 DIAGNOSIS — Z48815 Encounter for surgical aftercare following surgery on the digestive system: Secondary | ICD-10-CM | POA: Diagnosis not present

## 2020-03-22 DIAGNOSIS — K589 Irritable bowel syndrome without diarrhea: Secondary | ICD-10-CM | POA: Diagnosis not present

## 2020-03-22 DIAGNOSIS — H348122 Central retinal vein occlusion, left eye, stable: Secondary | ICD-10-CM | POA: Diagnosis not present

## 2020-03-22 DIAGNOSIS — J449 Chronic obstructive pulmonary disease, unspecified: Secondary | ICD-10-CM | POA: Diagnosis not present

## 2020-03-22 DIAGNOSIS — M329 Systemic lupus erythematosus, unspecified: Secondary | ICD-10-CM | POA: Diagnosis not present

## 2020-03-22 DIAGNOSIS — I700 Atherosclerosis of aorta: Secondary | ICD-10-CM | POA: Diagnosis not present

## 2020-03-22 DIAGNOSIS — N184 Chronic kidney disease, stage 4 (severe): Secondary | ICD-10-CM | POA: Diagnosis not present

## 2020-03-22 DIAGNOSIS — I2510 Atherosclerotic heart disease of native coronary artery without angina pectoris: Secondary | ICD-10-CM | POA: Diagnosis not present

## 2020-03-22 DIAGNOSIS — H5462 Unqualified visual loss, left eye, normal vision right eye: Secondary | ICD-10-CM | POA: Diagnosis not present

## 2020-03-22 DIAGNOSIS — I1310 Hypertensive heart and chronic kidney disease without heart failure, with stage 1 through stage 4 chronic kidney disease, or unspecified chronic kidney disease: Secondary | ICD-10-CM | POA: Diagnosis not present

## 2020-03-22 DIAGNOSIS — H6990 Unspecified Eustachian tube disorder, unspecified ear: Secondary | ICD-10-CM | POA: Diagnosis not present

## 2020-03-22 DIAGNOSIS — K219 Gastro-esophageal reflux disease without esophagitis: Secondary | ICD-10-CM | POA: Diagnosis not present

## 2020-03-22 DIAGNOSIS — D631 Anemia in chronic kidney disease: Secondary | ICD-10-CM | POA: Diagnosis not present

## 2020-03-22 DIAGNOSIS — E269 Hyperaldosteronism, unspecified: Secondary | ICD-10-CM | POA: Diagnosis not present

## 2020-03-23 ENCOUNTER — Other Ambulatory Visit: Payer: Self-pay | Admitting: Surgery

## 2020-03-23 DIAGNOSIS — R109 Unspecified abdominal pain: Secondary | ICD-10-CM

## 2020-03-23 MED ORDER — HYDROCODONE-ACETAMINOPHEN 5-325 MG PO TABS: 1.0000 | ORAL_TABLET | ORAL | 0 refills | Status: DC | PRN

## 2020-03-23 MED ORDER — CYCLOBENZAPRINE HCL 5 MG PO TABS: 5.0000 mg | ORAL_TABLET | Freq: Three times a day (TID) | ORAL | 0 refills | Status: AC | PRN

## 2020-03-23 NOTE — Progress Notes (Signed)
03/23/20  Patient's daughter Butch Penny called today regarding her mother.  She had a lot of pain at the level of the colostomy yesterday.  She had spoken about this to Dr. Windell Moment but declined pain medication prescription.  Today, she's asking if she can get something for pain prescribed as a precaution.  She mentions her mother is having pain in the left abdomen, as spasms.  It improved last night, and currently she's doing well, but wanted to have something as a precaution.  Discussed about her pain and that this could be related to constipation.  Will give two medications as a precaution:  Flexeril for possible spasms, and Vicodin for pain.  Discussed the benefit of using stool softeners and also MiraLax for constipation.  Olean Ree, MD

## 2020-03-26 ENCOUNTER — Telehealth: Payer: Self-pay

## 2020-03-26 ENCOUNTER — Telehealth: Payer: Self-pay | Admitting: Family Medicine

## 2020-03-26 DIAGNOSIS — Z48815 Encounter for surgical aftercare following surgery on the digestive system: Secondary | ICD-10-CM | POA: Diagnosis not present

## 2020-03-26 DIAGNOSIS — Z433 Encounter for attention to colostomy: Secondary | ICD-10-CM | POA: Diagnosis not present

## 2020-03-26 DIAGNOSIS — I2510 Atherosclerotic heart disease of native coronary artery without angina pectoris: Secondary | ICD-10-CM | POA: Diagnosis not present

## 2020-03-26 DIAGNOSIS — N184 Chronic kidney disease, stage 4 (severe): Secondary | ICD-10-CM | POA: Diagnosis not present

## 2020-03-26 DIAGNOSIS — I1310 Hypertensive heart and chronic kidney disease without heart failure, with stage 1 through stage 4 chronic kidney disease, or unspecified chronic kidney disease: Secondary | ICD-10-CM | POA: Diagnosis not present

## 2020-03-26 DIAGNOSIS — J449 Chronic obstructive pulmonary disease, unspecified: Secondary | ICD-10-CM | POA: Diagnosis not present

## 2020-03-26 NOTE — Telephone Encounter (Signed)
Verbal given to Diane from [facility] CBC and CMP as per Dr Raliegh Ip and advised if she has more question need hospital follow up appointment.

## 2020-03-26 NOTE — Telephone Encounter (Signed)
I called and spoke with the patient daughter Shirlean Mylar. She informed me that the patient is currently sleep. She said she will rely the message to her mother and calls [place] back if there is any concerns.

## 2020-03-26 NOTE — Telephone Encounter (Signed)
Copied from [place] [id number]. Topic: General - Other >> Mar 26, 2020  9:14 AM Rainey Pines A wrote: Patient would like BMP lab order  placed. Please advise

## 2020-03-26 NOTE — Telephone Encounter (Signed)
Verbal given to Diane for ostomy care and blood drawn.

## 2020-03-26 NOTE — Telephone Encounter (Signed)
Note patient has not had a Hospital Follow-up appointment. She was offered this, and based on discharge summary I have now reviewed she was requested to get CMP / CBC 1 week after discharge.  Will request that verbal orders be changed to CMP / CBC.  [name], [facility] 03/26/2020, 3:34 PM

## 2020-03-26 NOTE — Telephone Encounter (Signed)
Copied from [place] [id number]. Topic: General - Other >> Mar 26, 2020 10:34 AM Rainey Pines A wrote: Shauna Hugh from [facility] is requesting a callback from [facility] in regards to getting a verbal to draw BMP blas for patient. Diane also noted that she will be off the next 2 days and that the soonest she will be able to draw labs for patient would be 03/29/20. Best contact for Diane is [phone]

## 2020-03-26 NOTE — Telephone Encounter (Signed)
I have not seen patient for hospital follow-up.  She has upcoming Nephrology apt with Dr Holley Raring on 03/29/20  I would recommend that her Nephrology check a BMP for her between now and 03/29/20 as best option.  Can you check with Dr Elwyn Lade office to ask if they can draw the BMP for patient?   Anthonette Legato, MD  Nephrology  NPI: [id number]  [place]  [place] [address]    Phone: [phone]  Marylynn Rigdon, [facility] 03/26/2020, 10:05 AM

## 2020-03-27 ENCOUNTER — Other Ambulatory Visit: Payer: Self-pay

## 2020-03-27 DIAGNOSIS — I10 Essential (primary) hypertension: Secondary | ICD-10-CM

## 2020-03-28 MED ORDER — NIFEDIPINE ER OSMOTIC RELEASE 60 MG PO TB24: 60.0000 mg | ORAL_TABLET | Freq: Every evening | ORAL | 1 refills | Status: AC

## 2020-03-29 DIAGNOSIS — J449 Chronic obstructive pulmonary disease, unspecified: Secondary | ICD-10-CM | POA: Diagnosis not present

## 2020-03-29 DIAGNOSIS — I2510 Atherosclerotic heart disease of native coronary artery without angina pectoris: Secondary | ICD-10-CM | POA: Diagnosis not present

## 2020-03-29 DIAGNOSIS — N184 Chronic kidney disease, stage 4 (severe): Secondary | ICD-10-CM | POA: Diagnosis not present

## 2020-03-29 DIAGNOSIS — I1310 Hypertensive heart and chronic kidney disease without heart failure, with stage 1 through stage 4 chronic kidney disease, or unspecified chronic kidney disease: Secondary | ICD-10-CM | POA: Diagnosis not present

## 2020-03-29 DIAGNOSIS — Z48815 Encounter for surgical aftercare following surgery on the digestive system: Secondary | ICD-10-CM | POA: Diagnosis not present

## 2020-03-29 DIAGNOSIS — Z433 Encounter for attention to colostomy: Secondary | ICD-10-CM | POA: Diagnosis not present

## 2020-03-29 LAB — CBC AND DIFFERENTIAL
HCT: 29 — AB (ref 36–46)
Hemoglobin: 9.8 — AB (ref 12.0–16.0)
Neutrophils Absolute: 3.1
Platelets: 444 — AB (ref 150–399)
WBC: 4.7

## 2020-03-29 LAB — BASIC METABOLIC PANEL
BUN: 19 (ref 4–21)
CO2: 23 — AB (ref 13–22)
Chloride: 96 — AB (ref 99–108)
Creatinine: 1.4 — AB (ref 0.5–1.1)
Glucose: 93
Potassium: 3.7 (ref 3.4–5.3)
Sodium: 135 — AB (ref 137–147)

## 2020-03-29 LAB — COMPREHENSIVE METABOLIC PANEL
Calcium: 9.4 (ref 8.7–10.7)
GFR calc non Af Amer: 36

## 2020-03-29 LAB — CBC: RBC: 2.95 — AB (ref 3.87–5.11)

## 2020-03-30 ENCOUNTER — Encounter: Payer: Self-pay | Admitting: Family Medicine

## 2020-03-30 ENCOUNTER — Telehealth: Payer: Self-pay | Admitting: Family Medicine

## 2020-03-30 NOTE — Telephone Encounter (Signed)
Please notify patient of her [facility] results.  Sodium has improved nearly normal now. Potassium is normal. Kidney function is stable from previous results. Back to baseline.  Blood count shows much improved Hemoglobin back to 9.8 now.  Keep up with follow-up as scheduled with specialists.  [name], [facility] 03/30/2020, 1:39 PM

## 2020-03-30 NOTE — Telephone Encounter (Signed)
Patient's spouse advised.  

## 2020-04-02 DIAGNOSIS — I1310 Hypertensive heart and chronic kidney disease without heart failure, with stage 1 through stage 4 chronic kidney disease, or unspecified chronic kidney disease: Secondary | ICD-10-CM | POA: Diagnosis not present

## 2020-04-02 DIAGNOSIS — J449 Chronic obstructive pulmonary disease, unspecified: Secondary | ICD-10-CM | POA: Diagnosis not present

## 2020-04-02 DIAGNOSIS — I2510 Atherosclerotic heart disease of native coronary artery without angina pectoris: Secondary | ICD-10-CM | POA: Diagnosis not present

## 2020-04-02 DIAGNOSIS — Z433 Encounter for attention to colostomy: Secondary | ICD-10-CM | POA: Diagnosis not present

## 2020-04-02 DIAGNOSIS — Z48815 Encounter for surgical aftercare following surgery on the digestive system: Secondary | ICD-10-CM | POA: Diagnosis not present

## 2020-04-02 DIAGNOSIS — N184 Chronic kidney disease, stage 4 (severe): Secondary | ICD-10-CM | POA: Diagnosis not present

## 2020-04-05 ENCOUNTER — Ambulatory Visit: Payer: Medicare Other | Admitting: Radiation Oncology

## 2020-04-05 ENCOUNTER — Other Ambulatory Visit: Payer: Self-pay

## 2020-04-05 DIAGNOSIS — J432 Centrilobular emphysema: Secondary | ICD-10-CM

## 2020-04-05 MED ORDER — SPIRONOLACTONE 25 MG PO TABS: 12.5000 mg | ORAL_TABLET | Freq: Every day | ORAL | 0 refills | Status: AC

## 2020-04-06 DIAGNOSIS — L308 Other specified dermatitis: Secondary | ICD-10-CM | POA: Diagnosis not present

## 2020-04-06 DIAGNOSIS — Z48815 Encounter for surgical aftercare following surgery on the digestive system: Secondary | ICD-10-CM | POA: Diagnosis not present

## 2020-04-06 DIAGNOSIS — Z433 Encounter for attention to colostomy: Secondary | ICD-10-CM | POA: Diagnosis not present

## 2020-04-06 DIAGNOSIS — L309 Dermatitis, unspecified: Secondary | ICD-10-CM | POA: Diagnosis not present

## 2020-04-06 DIAGNOSIS — I2510 Atherosclerotic heart disease of native coronary artery without angina pectoris: Secondary | ICD-10-CM | POA: Diagnosis not present

## 2020-04-06 DIAGNOSIS — N184 Chronic kidney disease, stage 4 (severe): Secondary | ICD-10-CM | POA: Diagnosis not present

## 2020-04-06 DIAGNOSIS — I1310 Hypertensive heart and chronic kidney disease without heart failure, with stage 1 through stage 4 chronic kidney disease, or unspecified chronic kidney disease: Secondary | ICD-10-CM | POA: Diagnosis not present

## 2020-04-06 DIAGNOSIS — J449 Chronic obstructive pulmonary disease, unspecified: Secondary | ICD-10-CM | POA: Diagnosis not present

## 2020-04-12 DIAGNOSIS — I2510 Atherosclerotic heart disease of native coronary artery without angina pectoris: Secondary | ICD-10-CM | POA: Diagnosis not present

## 2020-04-12 DIAGNOSIS — J449 Chronic obstructive pulmonary disease, unspecified: Secondary | ICD-10-CM | POA: Diagnosis not present

## 2020-04-12 DIAGNOSIS — I1310 Hypertensive heart and chronic kidney disease without heart failure, with stage 1 through stage 4 chronic kidney disease, or unspecified chronic kidney disease: Secondary | ICD-10-CM | POA: Diagnosis not present

## 2020-04-12 DIAGNOSIS — Z48815 Encounter for surgical aftercare following surgery on the digestive system: Secondary | ICD-10-CM | POA: Diagnosis not present

## 2020-04-12 DIAGNOSIS — Z433 Encounter for attention to colostomy: Secondary | ICD-10-CM | POA: Diagnosis not present

## 2020-04-12 DIAGNOSIS — N184 Chronic kidney disease, stage 4 (severe): Secondary | ICD-10-CM | POA: Diagnosis not present

## 2020-04-13 ENCOUNTER — Other Ambulatory Visit: Payer: Self-pay

## 2020-04-13 ENCOUNTER — Telehealth (INDEPENDENT_AMBULATORY_CARE_PROVIDER_SITE_OTHER): Payer: Medicare Other | Admitting: Family Medicine

## 2020-04-13 ENCOUNTER — Encounter: Payer: Self-pay | Admitting: Family Medicine

## 2020-04-13 ENCOUNTER — Other Ambulatory Visit: Payer: Self-pay | Admitting: Family Medicine

## 2020-04-13 DIAGNOSIS — Z8719 Personal history of other diseases of the digestive system: Secondary | ICD-10-CM

## 2020-04-13 DIAGNOSIS — F5104 Psychophysiologic insomnia: Secondary | ICD-10-CM

## 2020-04-13 DIAGNOSIS — Z9049 Acquired absence of other specified parts of digestive tract: Secondary | ICD-10-CM

## 2020-04-13 DIAGNOSIS — Z933 Colostomy status: Secondary | ICD-10-CM

## 2020-04-13 DIAGNOSIS — R110 Nausea: Secondary | ICD-10-CM

## 2020-04-13 MED ORDER — LORAZEPAM 0.5 MG PO TABS: 0.5000 mg | ORAL_TABLET | Freq: Every day | ORAL | 5 refills | Status: AC

## 2020-04-13 NOTE — Progress Notes (Signed)
Virtual Visit via Telephone The purpose of this virtual visit is to provide medical care while limiting exposure to the novel coronavirus (COVID19) for both patient and office staff.  Consent was obtained for phone visit:  Yes.   Answered questions that patient had about telehealth interaction:  Yes.   I discussed the limitations, risks, security and privacy concerns of performing an evaluation and management service by telephone. I also discussed with the patient that there may be a patient responsible charge related to this service. The patient expressed understanding and agreed to proceed.  Patient Location: Home Provider Location: [facility] (Office)  Participants in virtual visit: - Patient: Margaret Black - CMA: Donnie Mesa, [place] - Provider: Dr Parks Ranger  ---------------------------------------------------------------------- Chief Complaint  Patient presents with  . Nausea    Post hospital f/u for diverticulitis x 3.5 weeks ago.  partial colectomy w/ ostomy bag. F/u appt schedule with her surgeon in 2 weeks. [place] coming out twice a week to assist the patient with ADL.    S: Reviewed CMA documentation. I have called patient and gathered additional HPI as follows:  Hospital Follow-up Admitted 03/01/20 Discharged 03/20/20 Location: [facility] Diagnosis: Acute diverticulitis with abscess Hospitalized for weight loss, abdominal pain, nausea vomiting, prior flares diverticulitis, ultimately established by gen surgery and had colonoscopy 12/9, and then colectomy colostomy procedure, on 12/15 She has done well overall, on discharge, has home health Last seen [facility] 04/03/20 Dr Ferrel Logan  Return to [facility] Surgery in 2 weeks Home nursing weekly, vitals good Family member to help with ostomy Gradual improvement Has family support most days since leaving hospital Tolerating PO, and on bowel regimen to help She says her mental health and anxiety are  affecting her with this major change but she is gradually adjusting, and hopeful for future reversal of ostomy  Denies any known or suspected exposure to person with or possibly with COVID19.  Denies any fevers, chills, sweats, body ache, cough, shortness of breath, sinus pain or pressure, headache, diarrhea  Past Medical History:  Diagnosis Date  . Anxiety    Related to HTN and vision loss  . Central retinal vein occlusion of left eye    legally blind in Left eye  . 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 Collagen vascular disease ([place])    uncharacterized.  has seen rheum.  positive RF, positive serologies for Sjogrens.  C-ANCA/P-ANCA neg, ANA  neg, anti-dsDNA neg, anti-SCL70 neg, anti-centromere Ab neg  . COPD (chronic obstructive pulmonary disease) ([place])    quit smoking 1993  . Diabetes mellitus without complication ([place])   . Diverticulosis   . Dysfunction of eustachian tube   . Fatigue   . GERD (gastroesophageal reflux disease)   . Goiter, unspecified   . HLD (hyperlipidemia)   . HTN (hypertension)    difficult to control, hypertensive urgency hospitalizations, normal urine/plasma metanephrines and catecholamines, renal dopplers without stenosis  . Hyperaldosteronism    serum aldo/renin ration elevated (aldo 23.7, renin <0.15), CT abd 2010 with normal adrenals, dedicated adrenal CT without adrenal adenoma,   . Hypothyroid   . Insomnia    Related to HTN and vision loss  . Irritable bowel syndrome   . Leiomyosarcoma of uterus ([place]) 09/22/2014  . Lung cancer [facility])    s/p R lobectomy 2001  . Lupus ([place])   . Osteopenia   . Pulmonary hypertension ([place])    echo [facility] 12/2008 with  EF >5%, grade II diastolic dysfunction, RSVP >45mHg, ACE level normal.  [place]- EF 55-60%, mild MR, mild LAE, PASP 25 + RA (no pulm HTN)  . Thyroid cancer ([place] 2014  . Urge incontinence   . Uterine cancer ([facility]    s/p hysterectomy 2001   Social  History   Tobacco Use  . Smoking status: Former Smoker    Types: Cigarettes    Quit date: 07/04/1991    Years since quitting: 28.7  . Smokeless tobacco: Never Used  . Tobacco comment: Quit in early 1980's.  Vaping Use  . Vaping Use: Never used  Substance Use Topics  . Alcohol use: No  . Drug use: No    Current Outpatient Medications:  .  BYSTOLIC 5 MG tablet, Take 1 tablet (5 mg total) by mouth daily., Disp: 90 tablet, Rfl: 3 .  enalapril (VASOTEC) 5 MG tablet, Take 5 mg by mouth at bedtime., Disp: , Rfl:  .  Ensure Max Protein (ENSURE MAX PROTEIN) LIQD, Take 330 mLs (11 oz total) by mouth daily., Disp: , Rfl:  .  lansoprazole (PREVACID) 15 MG capsule, Take 1 capsule (15 mg total) by mouth daily before breakfast., Disp: 90 capsule, Rfl: 3 .  NIFEdipine (PROCARDIA XL/NIFEDICAL XL) 60 MG 24 hr tablet, Take 1 tablet (60 mg total) by mouth every evening., Disp: 90 tablet, Rfl: 1 .  ondansetron (ZOFRAN ODT) 4 MG disintegrating tablet, Take 1 tablet (4 mg total) by mouth every 8 (eight) hours as needed for nausea or vomiting., Disp: 30 tablet, Rfl: 2 .  spironolactone (ALDACTONE) 25 MG tablet, Take 0.5 tablets (12.5 mg total) by mouth daily., Disp: 30 tablet, Rfl: 0 .  SYNTHROID 88 MCG tablet, Take 1 tablet (88 mcg total) by mouth daily before breakfast., Disp: 90 tablet, Rfl: 3 .  acidophilus (RISAQUAD) CAPS capsule, 2 capsules daily (okay to substitute any probiotic) (Patient not taking: Reported on 04/13/2020), Disp: 60 capsule, Rfl: 0 .  CALCIUM-VITAMIN D PO, Take 1 tablet by mouth daily.  (Patient not taking: No sig reported), Disp: , Rfl:  .  cyclobenzaprine (FLEXERIL) 5 MG tablet, Take 1 tablet (5 mg total) by mouth 3 (three) times daily as needed for muscle spasms. (Patient not taking: Reported on 04/13/2020), Disp: 30 tablet, Rfl: 0 .  ferrous sulfate 325 (65 FE) MG tablet, Take 1 tablet (325 mg total) by mouth 2 (two) times daily with a meal. (Patient not taking: Reported on 04/13/2020),  Disp: 60 tablet, Rfl: 3 .  LORazepam (ATIVAN) 0.5 MG tablet, Take 1 tablet (0.5 mg total) by mouth at bedtime., Disp: 30 tablet, Rfl: 5 .  Multiple Vitamin (MULTIVITAMIN WITH MINERALS) TABS tablet, Take 1 tablet by mouth daily. (Patient not taking: Reported on 04/13/2020), Disp: 90 tablet, Rfl: 1 .  Multiple Vitamin (MULTIVITAMIN) tablet, Take 1 tablet by mouth daily.  (Patient not taking: No sig reported), Disp: , Rfl:  .  polyethylene glycol (MIRALAX / GLYCOLAX) 17 g packet, Take 17 g by mouth daily as needed for mild constipation or moderate constipation. (Patient not taking: Reported on 04/13/2020), Disp: 14 each, Rfl: 0 .  vitamin C (ASCORBIC ACID) 500 MG tablet, Take 500 mg by mouth daily.  (Patient not taking: No sig reported), Disp: , Rfl:   [facility] 08/23/2019 01/06/2019 07/09/2018  Decreased Interest 0 0 0  Down, Depressed, Hopeless 0 1 0  PHQ - 2 Score 0 1 0  Altered sleeping - - 0  Tired,  decreased energy - - 0  Change in appetite - - 0  Feeling bad or failure about yourself  - - 0  Trouble concentrating - - 0  Moving slowly or fidgety/restless - - 0  Suicidal thoughts - - 0  PHQ-9 Score - - 0  Difficult doing work/chores - - Not difficult at all  Some recent data might be hidden    GAD 7 : Generalized Anxiety Score 07/09/2018 07/01/2016  Nervous, Anxious, on Edge 1 0  Control/stop worrying 0 1  Worry too much - different things 0 0  Trouble relaxing 0 0  Restless 0 0  Easily annoyed or irritable 0 1  Afraid - awful might happen 0 0  Total GAD 7 Score 1 2  Anxiety Difficulty - Not difficult at all    -------------------------------------------------------------------------- O: No physical exam performed due to remote telephone encounter.  Lab results reviewed.  Recent Results (from the past 2160 hour(s))  CBC with Differential     Status: Abnormal   Collection Time: 01/21/20  8:32 PM  Result Value Ref Range   WBC 13.1 (H) 4.0 - 10.5 K/uL   RBC 3.56  (L) 3.87 - 5.11 MIL/uL   Hemoglobin 11.5 (L) 12.0 - 15.0 g/dL   HCT 32.2 (L) 36.0 - 46.0 %   MCV 90.4 80.0 - 100.0 fL   MCH 32.3 26.0 - 34.0 pg   MCHC 35.7 30.0 - 36.0 g/dL   RDW 12.8 11.5 - 15.5 %   Platelets 453 (H) 150 - 400 K/uL   nRBC 0.0 0.0 - 0.2 %   Neutrophils Relative % 73 %   Neutro Abs 9.7 (H) 1.7 - 7.7 K/uL   Lymphocytes Relative 18 %   Lymphs Abs 2.3 0.7 - 4.0 K/uL   Monocytes Relative 6 %   Monocytes Absolute 0.8 0.1 - 1.0 K/uL   Eosinophils Relative 1 %   Eosinophils Absolute 0.2 0.0 - 0.5 K/uL   Basophils Relative 1 %   Basophils Absolute 0.1 0.0 - 0.1 K/uL   Immature Granulocytes 1 %   Abs Immature Granulocytes 0.08 (H) 0.00 - 0.07 K/uL    Comment: Performed at [facility], [address] [place], [place] [address]  Basic metabolic panel     Status: Abnormal   Collection Time: 01/21/20  8:32 PM  Result Value Ref Range   Sodium 120 (L) 135 - 145 mmol/L   Potassium 3.8 3.5 - 5.1 mmol/L   Chloride 88 (L) 98 - 111 mmol/L   CO2 19 (L) 22 - 32 mmol/L   Glucose, Bld 86 70 - 99 mg/dL    Comment: Glucose reference range applies only to samples taken after fasting for at least 8 hours.   BUN 25 (H) 8 - 23 mg/dL   Creatinine, Ser 2.00 (H) 0.44 - 1.00 mg/dL   Calcium 8.6 (L) 8.9 - 10.3 mg/dL   GFR, Estimated 25 (L) >60 mL/min    Comment: (NOTE) Calculated using the CKD-EPI Creatinine Equation (2021)    Anion gap 13 5 - 15    Comment: Performed at [facility], [place], [place] [address]  Troponin I (High Sensitivity)     Status: None   Collection Time: 01/21/20  8:32 PM  Result Value Ref Range   Troponin I (High Sensitivity) 8 <18 ng/L    Comment: (NOTE) Elevated high sensitivity troponin I (hsTnI) values and significant  changes across serial measurements may suggest ACS but many other  chronic and acute conditions are known to elevate hsTnI results.  Refer to the "Links" section for chest pain algorithms and additional   guidance. Performed at [facility], [address] [place], [place] [address]   TSH     Status: Abnormal   Collection Time: 01/21/20  8:32 PM  Result Value Ref Range   TSH 7.027 (H) 0.350 - 4.500 uIU/mL    Comment: Performed by a 3rd Generation assay with a functional sensitivity of <=0.01 uIU/mL. Performed at [facility], [address] [place], [place] [address]   Magnesium     Status: None   Collection Time: 01/21/20  8:32 PM  Result Value Ref Range   Magnesium 1.7 1.7 - 2.4 mg/dL    Comment: Performed at [facility], [address] [place], [place] [address]  Urinalysis, Complete w Microscopic     Status: Abnormal   Collection Time: 01/21/20  9:27 PM  Result Value Ref Range   Color, Urine STRAW (A) YELLOW   APPearance CLEAR (A) CLEAR   Specific Gravity, Urine 1.001 (L) 1.005 - 1.030   pH 5.0 5.0 - 8.0   Glucose, UA NEGATIVE NEGATIVE mg/dL   Hgb urine dipstick NEGATIVE NEGATIVE   Bilirubin Urine NEGATIVE NEGATIVE   Ketones, ur NEGATIVE NEGATIVE mg/dL   Protein, ur NEGATIVE NEGATIVE mg/dL   Nitrite NEGATIVE NEGATIVE   Leukocytes,Ua NEGATIVE NEGATIVE   WBC, UA NONE SEEN 0 - 5 WBC/hpf   Bacteria, UA NONE SEEN NONE SEEN   Squamous Epithelial / LPF NONE SEEN 0 - 5    Comment: Performed at [facility], [address] [place], [place] [address]  Respiratory Panel by RT PCR (Flu A&B, Covid) - Nasopharyngeal Swab     Status: None   Collection Time: 01/21/20  9:27 PM   Specimen: Nasopharyngeal Swab  Result Value Ref Range   SARS Coronavirus 2 by RT PCR NEGATIVE NEGATIVE    Comment: (NOTE) SARS-CoV-2 target nucleic acids are NOT DETECTED.  The SARS-CoV-2 RNA is generally detectable in upper respiratoy specimens during the acute phase of infection. The lowest concentration of SARS-CoV-2 viral copies this assay can detect is 131 copies/mL. A negative result does not preclude SARS-Cov-2 infection and should not be used as the sole  basis for treatment or other patient management decisions. A negative result may occur with  improper specimen collection/handling, submission of specimen other than nasopharyngeal swab, presence of viral mutation(s) within the areas targeted by this assay, and inadequate number of viral copies (<131 copies/mL). A negative result must be combined with clinical observations, patient history, and epidemiological information. The expected result is Negative.  Fact Sheet for Patients:  PinkCheek.be  Fact Sheet for Healthcare Providers:  GravelBags.it  This test is no t yet approved or cleared by the [place] FDA and  has been authorized for detection and/or diagnosis of SARS-CoV-2 by FDA under an Emergency Use Authorization (EUA). This EUA will remain  in effect (meaning this test can be used) for the duration of the COVID-19 declaration under Section 564(b)(1) of the Act, 21 U.S.C. section 360bbb-3(b)(1), unless the authorization is terminated or revoked sooner.     Influenza A by PCR NEGATIVE NEGATIVE   Influenza B by PCR NEGATIVE NEGATIVE    Comment: (NOTE) The Xpert Xpress SARS-CoV-2/FLU/RSV assay is intended as an aid in  the diagnosis of influenza from Nasopharyngeal swab specimens and  should not be used as a sole basis for treatment. Nasal washings and  aspirates are unacceptable for Xpert Xpress SARS-CoV-2/FLU/RSV  testing.  Fact Sheet for Patients: PinkCheek.be  Fact Sheet for Healthcare Providers: GravelBags.it  This test is not yet approved or cleared by the [place] FDA and  has been authorized for detection and/or diagnosis of SARS-CoV-2 by  FDA under an Emergency Use Authorization (EUA). This EUA will remain  in effect (meaning this test can be used) for the duration of the  Covid-19 declaration under Section 564(b)(1) of the Act, 21   U.S.C. section 360bbb-3(b)(1), unless the authorization is  terminated or revoked. Performed at [facility], [address] [place], [place] [address]   Osmolality, urine     Status: Abnormal   Collection Time: 01/21/20  9:27 PM  Result Value Ref Range   Osmolality, Ur 83 (L) 300 - 900 mOsm/kg    Comment: Performed at [facility], [address] [place], [place] [address]  Sodium, urine, random     Status: None   Collection Time: 01/21/20  9:27 PM  Result Value Ref Range   Sodium, Ur 18 mmol/L    Comment: Performed at [facility], [address] [place], [place] [address]  Creatinine, urine, random     Status: None   Collection Time: 01/21/20  9:27 PM  Result Value Ref Range   Creatinine, Urine 13 mg/dL    Comment: Performed at [facility], [address] [place], [place] [address]  Osmolality     Status: Abnormal   Collection Time: 01/21/20 11:10 PM  Result Value Ref Range   Osmolality 256 (L) 275 - 295 mOsm/kg    Comment: Performed at [facility], [address] [place], [place] [address]  CBC     Status: Abnormal   Collection Time: 01/22/20  4:01 AM  Result Value Ref Range   WBC 9.6 4.0 - 10.5 K/uL   RBC 3.68 (L) 3.87 - 5.11 MIL/uL   Hemoglobin 12.0 12.0 - 15.0 g/dL   HCT 33.2 (L) 36.0 - 46.0 %   MCV 90.2 80.0 - 100.0 fL   MCH 32.6 26.0 - 34.0 pg   MCHC 36.1 (H) 30.0 - 36.0 g/dL   RDW 12.8 11.5 - 15.5 %   Platelets 456 (H) 150 - 400 K/uL   nRBC 0.0 0.0 - 0.2 %    Comment: Performed at [facility], [address] [place], [place] [address]  Basic metabolic panel     Status: Abnormal   Collection Time: 01/22/20  4:01 AM  Result Value Ref Range   Sodium 126 (L) 135 - 145 mmol/L   Potassium 3.9 3.5 - 5.1 mmol/L   Chloride 96 (L) 98 - 111 mmol/L   CO2 22 22 - 32 mmol/L   Glucose, Bld 77 70 - 99 mg/dL    Comment: Glucose reference range applies only to samples taken after fasting for at least 8  hours.   BUN 21 8 - 23 mg/dL   Creatinine, Ser 1.83 (H) 0.44 - 1.00 mg/dL   Calcium 8.9 8.9 - 10.3 mg/dL   GFR, Estimated 28 (L) >60 mL/min    Comment: (NOTE) Calculated using the CKD-EPI Creatinine Equation (2021)    Anion gap 8 5 - 15    Comment: Performed at [facility], [address] [place], [place] [address]  CBC     Status: Abnormal   Collection Time: 02/07/20 11:04 AM  Result Value Ref Range   WBC 5.1 3.4 - 10.8 x10E3/uL  RBC 3.69 (L) 3.77 - 5.28 x10E6/uL   Hemoglobin 11.7 11.1 - 15.9 g/dL   Hematocrit 34.9 34.0 - 46.6 %   MCV 95 79 - 97 fL   MCH 31.7 26.6 - 33.0 pg   MCHC 33.5 31.5 - 35.7 g/dL   RDW 12.9 11.7 - 15.4 %   Platelets 473 (H) 150 - 450 x10E3/uL  Comprehensive metabolic panel     Status: Abnormal   Collection Time: 02/07/20 11:04 AM  Result Value Ref Range   Glucose 94 65 - 99 mg/dL   BUN 12 8 - 27 mg/dL   Creatinine, Ser 1.24 (H) 0.57 - 1.00 mg/dL   GFR calc non Af Amer 41 (L) >59 mL/min/1.73   GFR calc Af Amer 48 (L) >59 mL/min/1.73    Comment: **In accordance with recommendations from the NKF-ASN Task force,**   [facility] is in the process of updating its eGFR calculation to the   2021 CKD-EPI creatinine equation that estimates kidney function   without a race variable.    BUN/Creatinine Ratio 10 (L) 12 - 28   Sodium 135 134 - 144 mmol/L   Potassium 4.5 3.5 - 5.2 mmol/L   Chloride 97 96 - 106 mmol/L   CO2 21 20 - 29 mmol/L   Calcium 9.5 8.7 - 10.3 mg/dL   Total Protein 6.6 6.0 - 8.5 g/dL   Albumin 4.0 3.7 - 4.7 g/dL   Globulin, Total 2.6 1.5 - 4.5 g/dL   Albumin/Globulin Ratio 1.5 1.2 - 2.2   Bilirubin Total 0.3 0.0 - 1.2 mg/dL   Alkaline Phosphatase 80 44 - 121 IU/L    Comment:               **Please note reference interval change**   AST 17 0 - 40 IU/L   ALT 14 0 - 32 IU/L  Phosphorus     Status: None   Collection Time: 02/07/20 11:04 AM  Result Value Ref Range   Phosphorus 3.6 3.0 - 4.3 mg/dL  Magnesium     Status: None    Collection Time: 02/07/20 11:04 AM  Result Value Ref Range   Magnesium 1.6 1.6 - 2.3 mg/dL  C-reactive protein     Status: None   Collection Time: 02/07/20 11:04 AM  Result Value Ref Range   CRP 10 0 - 10 mg/L  SARS CORONAVIRUS 2 (TAT 6-24 HRS) Nasopharyngeal Nasopharyngeal Swab     Status: None   Collection Time: 02/28/20 10:26 AM   Specimen: Nasopharyngeal Swab  Result Value Ref Range   SARS Coronavirus 2 NEGATIVE NEGATIVE    Comment: (NOTE) SARS-CoV-2 target nucleic acids are NOT DETECTED.  The SARS-CoV-2 RNA is generally detectable in upper and lower respiratory specimens during the acute phase of infection. Negative results do not preclude SARS-CoV-2 infection, do not rule out co-infections with other pathogens, and should not be used as the sole basis for treatment or other patient management decisions. Negative results must be combined with clinical observations, patient history, and epidemiological information. The expected result is Negative.  Fact Sheet for Patients: SugarRoll.be  Fact Sheet for Healthcare Providers: https://www.woods-mathews.com/  This test is not yet approved or cleared by the [place] FDA and  has been authorized for detection and/or diagnosis of SARS-CoV-2 by FDA under an Emergency Use Authorization (EUA). This EUA will remain  in effect (meaning this test can be used) for the duration of the COVID-19 declaration under Se ction 564(b)(1) of the Act, 21 U.S.C. section 360bbb-3(b)(1),  unless the authorization is terminated or revoked sooner.  Performed at [facility], [place] [address], [place], [place] [address]   Basic metabolic panel     Status: Abnormal   Collection Time: 03/01/20 11:53 AM  Result Value Ref Range   Sodium 136 135 - 145 mmol/L   Potassium 3.9 3.5 - 5.1 mmol/L   Chloride 96 (L) 98 - 111 mmol/L   CO2 25 22 - 32 mmol/L   Glucose, Bld 157 (H) 70 - 99 mg/dL    Comment: Glucose  reference range applies only to samples taken after fasting for at least 8 hours.   BUN 19 8 - 23 mg/dL   Creatinine, Ser 2.07 (H) 0.44 - 1.00 mg/dL   Calcium 8.8 (L) 8.9 - 10.3 mg/dL   GFR, Estimated 24 (L) >60 mL/min    Comment: (NOTE) Calculated using the CKD-EPI Creatinine Equation (2021)    Anion gap 15 5 - 15    Comment: Performed at [facility], [address] [place], [place] [address]  CBC with Differential/Platelet     Status: Abnormal   Collection Time: 03/01/20  2:07 PM  Result Value Ref Range   WBC 10.1 4.0 - 10.5 K/uL   RBC 3.25 (L) 3.87 - 5.11 MIL/uL   Hemoglobin 10.4 (L) 12.0 - 15.0 g/dL   HCT 30.0 (L) 36.0 - 46.0 %   MCV 92.3 80.0 - 100.0 fL   MCH 32.0 26.0 - 34.0 pg   MCHC 34.7 30.0 - 36.0 g/dL   RDW 13.3 11.5 - 15.5 %   Platelets 298 150 - 400 K/uL   nRBC 0.0 0.0 - 0.2 %   Neutrophils Relative % 92 %   Neutro Abs 9.3 (H) 1.7 - 7.7 K/uL   Lymphocytes Relative 4 %   Lymphs Abs 0.4 (L) 0.7 - 4.0 K/uL   Monocytes Relative 4 %   Monocytes Absolute 0.4 0.1 - 1.0 K/uL   Eosinophils Relative 0 %   Eosinophils Absolute 0.0 0.0 - 0.5 K/uL   Basophils Relative 0 %   Basophils Absolute 0.0 0.0 - 0.1 K/uL   Immature Granulocytes 0 %   Abs Immature Granulocytes 0.03 0.00 - 0.07 K/uL    Comment: Performed at [facility], [address] [place], [place] [address]  Urinalysis, Complete w Microscopic Urine, Clean Catch     Status: Abnormal   Collection Time: 03/01/20  3:04 PM  Result Value Ref Range   Color, Urine YELLOW (A) YELLOW   APPearance HAZY (A) CLEAR   Specific Gravity, Urine 1.010 1.005 - 1.030   pH 5.0 5.0 - 8.0   Glucose, UA NEGATIVE NEGATIVE mg/dL   Hgb urine dipstick NEGATIVE NEGATIVE   Bilirubin Urine NEGATIVE NEGATIVE   Ketones, ur 5 (A) NEGATIVE mg/dL   Protein, ur NEGATIVE NEGATIVE mg/dL   Nitrite NEGATIVE NEGATIVE   Leukocytes,Ua TRACE (A) NEGATIVE   RBC / HPF 0-5 0 - 5 RBC/hpf   WBC, UA 0-5 0 - 5 WBC/hpf   Bacteria, UA  RARE (A) NONE SEEN   Squamous Epithelial / LPF 0-5 0 - 5   Mucus PRESENT    Hyaline Casts, UA PRESENT     Comment: Performed at [facility], [address] [place], [place] [address]  Basic metabolic panel     Status: Abnormal   Collection Time: 03/02/20  6:38 AM  Result Value Ref Range   Sodium 135 135 - 145 mmol/L   Potassium 3.3 (L)  3.5 - 5.1 mmol/L   Chloride 98 98 - 111 mmol/L   CO2 23 22 - 32 mmol/L   Glucose, Bld 141 (H) 70 - 99 mg/dL    Comment: Glucose reference range applies only to samples taken after fasting for at least 8 hours.   BUN 20 8 - 23 mg/dL   Creatinine, Ser 1.91 (H) 0.44 - 1.00 mg/dL   Calcium 8.2 (L) 8.9 - 10.3 mg/dL   GFR, Estimated 26 (L) >60 mL/min    Comment: (NOTE) Calculated using the CKD-EPI Creatinine Equation (2021)    Anion gap 14 5 - 15    Comment: Performed at [facility], [address] [place], [place] [address]  CBC     Status: Abnormal   Collection Time: 03/02/20  6:38 AM  Result Value Ref Range   WBC 9.6 4.0 - 10.5 K/uL   RBC 3.39 (L) 3.87 - 5.11 MIL/uL   Hemoglobin 11.0 (L) 12.0 - 15.0 g/dL   HCT 32.3 (L) 36.0 - 46.0 %   MCV 95.3 80.0 - 100.0 fL   MCH 32.4 26.0 - 34.0 pg   MCHC 34.1 30.0 - 36.0 g/dL   RDW 13.7 11.5 - 15.5 %   Platelets 273 150 - 400 K/uL   nRBC 0.0 0.0 - 0.2 %    Comment: Performed at [facility], [address], [place], [place] [address]  Basic metabolic panel     Status: Abnormal   Collection Time: 03/03/20  7:50 AM  Result Value Ref Range   Sodium 134 (L) 135 - 145 mmol/L   Potassium 3.6 3.5 - 5.1 mmol/L   Chloride 103 98 - 111 mmol/L   CO2 23 22 - 32 mmol/L   Glucose, Bld 87 70 - 99 mg/dL    Comment: Glucose reference range applies only to samples taken after fasting for at least 8 hours.   BUN 21 8 - 23 mg/dL   Creatinine, Ser 1.68 (H) 0.44 - 1.00 mg/dL   Calcium 8.1 (L) 8.9 - 10.3 mg/dL   GFR, Estimated 31 (L) >60 mL/min    Comment: (NOTE) Calculated using the  CKD-EPI Creatinine Equation (2021)    Anion gap 8 5 - 15    Comment: Performed at [facility], [address] [place], [place] [address]  CBC     Status: Abnormal   Collection Time: 03/03/20  7:50 AM  Result Value Ref Range   WBC 9.9 4.0 - 10.5 K/uL   RBC 2.98 (L) 3.87 - 5.11 MIL/uL   Hemoglobin 9.5 (L) 12.0 - 15.0 g/dL   HCT 28.0 (L) 36.0 - 46.0 %   MCV 94.0 80.0 - 100.0 fL   MCH 31.9 26.0 - 34.0 pg   MCHC 33.9 30.0 - 36.0 g/dL   RDW 14.1 11.5 - 15.5 %   Platelets 266 150 - 400 K/uL   nRBC 0.0 0.0 - 0.2 %    Comment: Performed at [facility], [address] [place], [place] [address]  Magnesium     Status: Abnormal   Collection Time: 03/03/20  7:50 AM  Result Value Ref Range   Magnesium 1.6 (L) 1.7 - 2.4 mg/dL    Comment: Performed at [facility], [address], [place], [place] [address]  Phosphorus     Status: Abnormal   Collection Time: 03/03/20  7:50 AM  Result Value Ref Range   Phosphorus 2.3 (L) 2.5 - 4.6 mg/dL    Comment: Performed at  [facility], [address] [place], [place] [id number]  Glucose, capillary     Status: Abnormal   Collection Time: 03/03/20  8:44 PM  Result Value Ref Range   Glucose-Capillary 130 (H) 70 - 99 mg/dL    Comment: Glucose reference range applies only to samples taken after fasting for at least 8 hours.  Glucose, capillary     Status: Abnormal   Collection Time: 03/04/20  1:00 AM  Result Value Ref Range   Glucose-Capillary 166 (H) 70 - 99 mg/dL    Comment: Glucose reference range applies only to samples taken after fasting for at least 8 hours.  Glucose, capillary     Status: Abnormal   Collection Time: 03/04/20  4:09 AM  Result Value Ref Range   Glucose-Capillary 158 (H) 70 - 99 mg/dL    Comment: Glucose reference range applies only to samples taken after fasting for at least 8 hours.  Comprehensive metabolic panel     Status: Abnormal   Collection Time: 03/04/20  5:48 AM  Result Value Ref  Range   Sodium 134 (L) 135 - 145 mmol/L   Potassium 3.4 (L) 3.5 - 5.1 mmol/L   Chloride 104 98 - 111 mmol/L   CO2 24 22 - 32 mmol/L   Glucose, Bld 168 (H) 70 - 99 mg/dL    Comment: Glucose reference range applies only to samples taken after fasting for at least 8 hours.   BUN 16 8 - 23 mg/dL   Creatinine, Ser 1.32 (H) 0.44 - 1.00 mg/dL   Calcium 8.2 (L) 8.9 - 10.3 mg/dL   Total Protein 5.1 (L) 6.5 - 8.1 g/dL   Albumin 2.2 (L) 3.5 - 5.0 g/dL   AST 16 15 - 41 U/L   ALT 27 0 - 44 U/L   Alkaline Phosphatase 161 (H) 38 - 126 U/L   Total Bilirubin 1.1 0.3 - 1.2 mg/dL   GFR, Estimated 41 (L) >60 mL/min    Comment: (NOTE) Calculated using the CKD-EPI Creatinine Equation (2021)    Anion gap 6 5 - 15    Comment: Performed at [facility], [address] [place], [place] [address]  Magnesium     Status: None   Collection Time: 03/04/20  5:48 AM  Result Value Ref Range   Magnesium 1.8 1.7 - 2.4 mg/dL    Comment: Performed at [facility], [address] [place], [place] [address]  Phosphorus     Status: Abnormal   Collection Time: 03/04/20  5:48 AM  Result Value Ref Range   Phosphorus 2.1 (L) 2.5 - 4.6 mg/dL    Comment: Performed at [facility], [address] [place], [place] [address]  Triglycerides     Status: None   Collection Time: 03/04/20  5:48 AM  Result Value Ref Range   Triglycerides 123 <150 mg/dL    Comment: Performed at [facility], [address] [place], [place] [address]  CBC     Status: Abnormal   Collection Time: 03/04/20  5:48 AM  Result Value Ref Range   WBC 12.3 (H) 4.0 - 10.5 K/uL   RBC 2.91 (L) 3.87 - 5.11 MIL/uL   Hemoglobin 9.4 (L) 12.0 - 15.0 g/dL   HCT 26.9 (L) 36.0 - 46.0 %   MCV 92.4 80.0 - 100.0 fL   MCH 32.3 26.0 - 34.0 pg   MCHC 34.9 30.0 - 36.0 g/dL   RDW 14.1 11.5 - 15.5 %   Platelets 263  150 - 400 K/uL   nRBC 0.0 0.0 - 0.2 %    Comment: Performed at [facility], [address]  [place], [place] [address]  Differential     Status: Abnormal   Collection Time: 03/04/20  5:48 AM  Result Value Ref Range   Neutrophils Relative % 94 %   Neutro Abs 11.5 (H) 1.7 - 7.7 K/uL   Lymphocytes Relative 2 %   Lymphs Abs 0.3 (L) 0.7 - 4.0 K/uL   Monocytes Relative 2 %   Monocytes Absolute 0.3 0.1 - 1.0 K/uL   Eosinophils Relative 1 %   Eosinophils Absolute 0.1 0.0 - 0.5 K/uL   Basophils Relative 0 %   Basophils Absolute 0.0 0.0 - 0.1 K/uL   Immature Granulocytes 1 %   Abs Immature Granulocytes 0.06 0.00 - 0.07 K/uL    Comment: Performed at [facility], [address] [place], [place] [address]  Prealbumin     Status: Abnormal   Collection Time: 03/04/20  5:49 AM  Result Value Ref Range   Prealbumin 6.4 (L) 18 - 38 mg/dL    Comment: Performed at [facility], [place] [address] [place], [place] [address]  Glucose, capillary     Status: Abnormal   Collection Time: 03/04/20  7:45 AM  Result Value Ref Range   Glucose-Capillary 121 (H) 70 - 99 mg/dL    Comment: Glucose reference range applies only to samples taken after fasting for at least 8 hours.  Glucose, capillary     Status: Abnormal   Collection Time: 03/04/20 12:19 PM  Result Value Ref Range   Glucose-Capillary 160 (H) 70 - 99 mg/dL    Comment: Glucose reference range applies only to samples taken after fasting for at least 8 hours.  Glucose, capillary     Status: Abnormal   Collection Time: 03/04/20  4:56 PM  Result Value Ref Range   Glucose-Capillary 169 (H) 70 - 99 mg/dL    Comment: Glucose reference range applies only to samples taken after fasting for at least 8 hours.   Comment 1 Notify RN   Potassium     Status: None   Collection Time: 03/04/20  5:59 PM  Result Value Ref Range   Potassium 4.6 3.5 - 5.1 mmol/L    Comment: Performed at [facility], [address] [place], [place] [address]  Phosphorus     Status: Abnormal   Collection Time: 03/04/20  5:59 PM  Result Value Ref Range    Phosphorus 4.8 (H) 2.5 - 4.6 mg/dL    Comment: Performed at [facility], [address] [place], [place] [address]  Glucose, capillary     Status: Abnormal   Collection Time: 03/04/20  8:46 PM  Result Value Ref Range   Glucose-Capillary 201 (H) 70 - 99 mg/dL    Comment: Glucose reference range applies only to samples taken after fasting for at least 8 hours.  Glucose, capillary     Status: Abnormal   Collection Time: 03/05/20 12:08 AM  Result Value Ref Range   Glucose-Capillary 194 (H) 70 - 99 mg/dL    Comment: Glucose reference range applies only to samples taken after fasting for at least 8 hours.  Glucose, capillary     Status: Abnormal   Collection Time: 03/05/20  4:57 AM  Result Value Ref Range   Glucose-Capillary 178 (H) 70 - 99 mg/dL    Comment: Glucose reference range applies only to samples taken after fasting for  at least 8 hours.  Comprehensive metabolic panel     Status: Abnormal   Collection Time: 03/05/20  5:43 AM  Result Value Ref Range   Sodium 134 (L) 135 - 145 mmol/L   Potassium 3.2 (L) 3.5 - 5.1 mmol/L   Chloride 101 98 - 111 mmol/L   CO2 24 22 - 32 mmol/L   Glucose, Bld 195 (H) 70 - 99 mg/dL    Comment: Glucose reference range applies only to samples taken after fasting for at least 8 hours.   BUN 16 8 - 23 mg/dL   Creatinine, Ser 1.44 (H) 0.44 - 1.00 mg/dL   Calcium 8.4 (L) 8.9 - 10.3 mg/dL   Total Protein 5.3 (L) 6.5 - 8.1 g/dL   Albumin 2.1 (L) 3.5 - 5.0 g/dL   AST 16 15 - 41 U/L   ALT 19 0 - 44 U/L   Alkaline Phosphatase 194 (H) 38 - 126 U/L   Total Bilirubin 1.0 0.3 - 1.2 mg/dL   GFR, Estimated 37 (L) >60 mL/min    Comment: (NOTE) Calculated using the CKD-EPI Creatinine Equation (2021)    Anion gap 9 5 - 15    Comment: Performed at [facility], [address] [place], [place] [address]  Magnesium     Status: None   Collection Time: 03/05/20  5:43 AM  Result Value Ref Range   Magnesium 1.9 1.7 - 2.4 mg/dL    Comment:  Performed at [facility], [address] [place], [place] [address]  Phosphorus     Status: None   Collection Time: 03/05/20  5:43 AM  Result Value Ref Range   Phosphorus 2.9 2.5 - 4.6 mg/dL    Comment: Performed at [facility], [address] [place], [place] [address]  CBC     Status: Abnormal   Collection Time: 03/05/20  5:43 AM  Result Value Ref Range   WBC 10.3 4.0 - 10.5 K/uL   RBC 3.08 (L) 3.87 - 5.11 MIL/uL   Hemoglobin 10.1 (L) 12.0 - 15.0 g/dL   HCT 28.4 (L) 36.0 - 46.0 %   MCV 92.2 80.0 - 100.0 fL   MCH 32.8 26.0 - 34.0 pg   MCHC 35.6 30.0 - 36.0 g/dL   RDW 13.9 11.5 - 15.5 %   Platelets 277 150 - 400 K/uL   nRBC 0.0 0.0 - 0.2 %    Comment: Performed at [facility] [address] [place], [place] [address]  Differential     Status: Abnormal   Collection Time: 03/05/20  5:43 AM  Result Value Ref Range   Neutrophils Relative % 91 %   Neutro Abs 9.4 (H) 1.7 - 7.7 K/uL   Lymphocytes Relative 4 %   Lymphs Abs 0.4 (L) 0.7 - 4.0 K/uL   Monocytes Relative 3 %   Monocytes Absolute 0.3 0.1 - 1.0 K/uL   Eosinophils Relative 1 %   Eosinophils Absolute 0.1 0.0 - 0.5 K/uL   Basophils Relative 0 %   Basophils Absolute 0.0 0.0 - 0.1 K/uL   Immature Granulocytes 1 %   Abs Immature Granulocytes 0.06 0.00 - 0.07 K/uL    Comment: Performed at [facility], [address] [place], [place] [address]  Triglycerides     Status: Abnormal   Collection Time: 03/05/20  5:43 AM  Result Value Ref Range   Triglycerides 150 (H) <150 mg/dL    Comment: Performed at [facility], [address] [place],  [place] [address]  Prealbumin     Status: Abnormal   Collection Time: 03/05/20  5:43 AM  Result Value Ref Range   Prealbumin 7.2 (L) 18 - 38 mg/dL    Comment: Performed at [place] [address] [place], [place] [address]  Glucose, capillary     Status: Abnormal   Collection Time: 03/05/20  8:02 AM  Result Value Ref Range    Glucose-Capillary 178 (H) 70 - 99 mg/dL    Comment: Glucose reference range applies only to samples taken after fasting for at least 8 hours.  Glucose, capillary     Status: Abnormal   Collection Time: 03/05/20 12:17 PM  Result Value Ref Range   Glucose-Capillary 176 (H) 70 - 99 mg/dL    Comment: Glucose reference range applies only to samples taken after fasting for at least 8 hours.  Glucose, capillary     Status: Abnormal   Collection Time: 03/05/20  6:39 PM  Result Value Ref Range   Glucose-Capillary 185 (H) 70 - 99 mg/dL    Comment: Glucose reference range applies only to samples taken after fasting for at least 8 hours.  Glucose, capillary     Status: Abnormal   Collection Time: 03/06/20 12:48 AM  Result Value Ref Range   Glucose-Capillary 221 (H) 70 - 99 mg/dL    Comment: Glucose reference range applies only to samples taken after fasting for at least 8 hours.  Renal function panel     Status: Abnormal   Collection Time: 03/06/20  5:27 AM  Result Value Ref Range   Sodium 137 135 - 145 mmol/L   Potassium 4.3 3.5 - 5.1 mmol/L   Chloride 105 98 - 111 mmol/L   CO2 24 22 - 32 mmol/L   Glucose, Bld 222 (H) 70 - 99 mg/dL    Comment: Glucose reference range applies only to samples taken after fasting for at least 8 hours.   BUN 23 8 - 23 mg/dL   Creatinine, Ser 1.43 (H) 0.44 - 1.00 mg/dL   Calcium 8.6 (L) 8.9 - 10.3 mg/dL   Phosphorus 2.7 2.5 - 4.6 mg/dL   Albumin 2.2 (L) 3.5 - 5.0 g/dL   GFR, Estimated 37 (L) >60 mL/min    Comment: (NOTE) Calculated using the CKD-EPI Creatinine Equation (2021)    Anion gap 8 5 - 15    Comment: Performed at [facility], [address], [place], [place] [address]  Magnesium     Status: None   Collection Time: 03/06/20  5:27 AM  Result Value Ref Range   Magnesium 1.8 1.7 - 2.4 mg/dL    Comment: Performed at [facility], [address] [place], [place] [address]  Glucose, capillary     Status: Abnormal   Collection  Time: 03/06/20 12:29 PM  Result Value Ref Range   Glucose-Capillary 216 (H) 70 - 99 mg/dL    Comment: Glucose reference range applies only to samples taken after fasting for at least 8 hours.  Glucose, capillary     Status: Abnormal   Collection Time: 03/06/20  6:05 PM  Result Value Ref Range   Glucose-Capillary 223 (H) 70 - 99 mg/dL    Comment: Glucose reference range applies only to samples taken after fasting for at least 8 hours.  Glucose, capillary     Status: Abnormal   Collection Time: 03/06/20 11:44 PM  Result Value Ref Range   Glucose-Capillary 216 (H) 70 - 99 mg/dL    Comment:  Glucose reference range applies only to samples taken after fasting for at least 8 hours.  Renal function panel     Status: Abnormal   Collection Time: 03/07/20  4:15 AM  Result Value Ref Range   Sodium 137 135 - 145 mmol/L   Potassium 4.3 3.5 - 5.1 mmol/L   Chloride 104 98 - 111 mmol/L   CO2 26 22 - 32 mmol/L   Glucose, Bld 213 (H) 70 - 99 mg/dL    Comment: Glucose reference range applies only to samples taken after fasting for at least 8 hours.   BUN 23 8 - 23 mg/dL   Creatinine, Ser 1.34 (H) 0.44 - 1.00 mg/dL   Calcium 8.7 (L) 8.9 - 10.3 mg/dL   Phosphorus 3.2 2.5 - 4.6 mg/dL   Albumin 2.2 (L) 3.5 - 5.0 g/dL   GFR, Estimated 40 (L) >60 mL/min    Comment: (NOTE) Calculated using the CKD-EPI Creatinine Equation (2021)    Anion gap 7 5 - 15    Comment: Performed at [facility], [address], [place], [place] [address]  Magnesium     Status: None   Collection Time: 03/07/20  4:15 AM  Result Value Ref Range   Magnesium 1.8 1.7 - 2.4 mg/dL    Comment: Performed at [facility], [address] [place], [place] [address]  Glucose, capillary     Status: Abnormal   Collection Time: 03/07/20  5:20 AM  Result Value Ref Range   Glucose-Capillary 206 (H) 70 - 99 mg/dL    Comment: Glucose reference range applies only to samples taken after fasting for at least 8 hours.  Glucose,  capillary     Status: Abnormal   Collection Time: 03/07/20 11:46 AM  Result Value Ref Range   Glucose-Capillary 222 (H) 70 - 99 mg/dL    Comment: Glucose reference range applies only to samples taken after fasting for at least 8 hours.  Anaerobic culture     Status: None   Collection Time: 03/07/20  5:37 PM   Specimen: Abdomen; Wound  Result Value Ref Range   Specimen Description      ABDOMEN Performed at [facility], [address] [place], [place] [address]    Special Requests      NONE Performed at [facility], [place], [place] [address]    Gram Stain      ABUNDANT WBC PRESENT, PREDOMINANTLY MONONUCLEAR RARE GRAM VARIABLE ROD    Culture      NO ANAEROBES ISOLATED Performed at [facility], [place] [address] [place], [place] [address]    Report Status 03/13/2020 FINAL   Aerobic Culture (superficial specimen)     Status: None   Collection Time: 03/07/20  5:47 PM   Specimen: Wound  Result Value Ref Range   Specimen Description WOUND    Special Requests NONE    Gram Stain      FEW WBC PRESENT,BOTH PMN AND MONONUCLEAR NO ORGANISMS SEEN Performed at [facility], [place] [address] [place], [place] [address]    Culture RARE ESCHERICHIA COLI    Report Status 03/10/2020 FINAL    Organism ID, Bacteria ESCHERICHIA COLI       Susceptibility   Escherichia coli - MIC*    AMPICILLIN >=32 RESISTANT Resistant     CEFAZOLIN 8 SENSITIVE Sensitive     CEFEPIME <=0.12 SENSITIVE Sensitive     CEFTAZIDIME <=1 SENSITIVE Sensitive     CEFTRIAXONE <=0.25  SENSITIVE Sensitive     CIPROFLOXACIN >=4 RESISTANT Resistant     GENTAMICIN <=1 SENSITIVE Sensitive     IMIPENEM <=0.25 SENSITIVE Sensitive     TRIMETH/SULFA <=20 SENSITIVE Sensitive     AMPICILLIN/SULBACTAM >=32 RESISTANT Resistant     PIP/TAZO <=4 SENSITIVE Sensitive     * RARE ESCHERICHIA COLI  Surgical pathology     Status: None   Collection Time: 03/07/20  6:47 PM  Result Value  Ref Range   SURGICAL PATHOLOGY      SURGICAL PATHOLOGY CASE: [id number] PATIENT: Amorah Blaustein Surgical Pathology Report     Specimen Submitted: A. Liver, biopsy B. Sigmoid colon  Clinical History: Acute diverticulosis and abscess      DIAGNOSIS: A. LIVER; BIOPSY: - BENIGN LIVER WITH DENSE FIBROUS SUBCAPSULAR SCAR. - NEGATIVE FOR MALIGNANCY.  B. SIGMOID COLON; RESECTION: - PERFORATED DIVERTICULITIS WITH ABSCESS AND SEROSITIS. - TEN BENIGN LYMPH NODES. - NEGATIVE FOR MALIGNANCY.   GROSS DESCRIPTION: A. Labeled: Liver biopsy Received: Formalin Tissue fragment(s): 1 Size: 0.9 x 0.4 x 0.3 cm Description: Received is a wedge of tan liver with an ill-defined area of white discoloration on the serosal surface, 0.4 x 0.2 cm.  The specimen is unoriented and inked as follows: Serosa = black, resection margin = blue.  The specimen is trisected.  The area of discoloration extends to a depth of 0.4 cm and is less than 0.1 cm to the blue inked margin. Entirely submitted in 1 cassette.  B. Labe led: Sigmoid colon Received: Formalin Specimens received: Portion of sigmoid colon with attached mesentery Measurements: 8.6 cm long by 7.1 cm in circumference Specimen Integrity: Intact Orientation: Unoriented External surface: The serosa is tan-pink, smooth, and glistening with scattered areas of adherent brown-purple exudate. Description: The mucosa is tan and folded with at least 30 diverticula, up to 2 cm in length and 0.6 cm in diameter.  No distinct areas of perforation are grossly appreciated.  The wall thickness ranges from 0.3 to 0.8 cm.  The surrounding mesentery has superficial areas of red-brown discoloration with tan-white exudate.  Within the mesentery adjacent to the bowel wall there is a focal area of purulence and potential abscess formation, 1.1 x 1 x 0.9 cm. Lymph nodes: 10 lymph node candidates are identified ranging from 0.3 to 0.9 cm in greatest  dimension.  Block Summary: 1 - 2 - resection margin A, en face 3 - 4 - resection margin B, en face  5 - 8 - representative diverticula 9 - representative area of purulence adjacent to bowel wall 10 - representative thickened bowel wall 11 - representative discolored mesentery and serosa with overlying exudate 12 - 17 - lymph node candidates, submitted entirely      12 - 4 lymph node candidates      13 - 14 - 2 lymph node candidates, bisected and differentially inked, in each (4 total)      15 - 1 lymph node candidate, trisected      16 - 17 - 1 lymph node candidate, serially sectioned  Final Diagnosis performed by Betsy Pries, MD.   Electronically signed 03/13/2020 1:24:31PM The electronic signature indicates that the named Attending Pathologist has evaluated the specimen Technical component performed at [facility], [address] [place], [place] [address] Lab: [phone] Dir: Rush Farmer, MD, MMM  Professional component performed at [facility], [facility], [place], [place], [place] [address] Lab: [phone] Dir: Dellia Nims. Rubinas, M D   Glucose, capillary     Status:  Abnormal   Collection Time: 03/07/20  8:52 PM  Result Value Ref Range   Glucose-Capillary 238 (H) 70 - 99 mg/dL    Comment: Glucose reference range applies only to samples taken after fasting for at least 8 hours.  Glucose, capillary     Status: Abnormal   Collection Time: 03/07/20  9:50 PM  Result Value Ref Range   Glucose-Capillary 221 (H) 70 - 99 mg/dL    Comment: Glucose reference range applies only to samples taken after fasting for at least 8 hours.  Glucose, capillary     Status: Abnormal   Collection Time: 03/08/20 12:26 AM  Result Value Ref Range   Glucose-Capillary 304 (H) 70 - 99 mg/dL    Comment: Glucose reference range applies only to samples taken after fasting for at least 8 hours.  CBC     Status: Abnormal   Collection Time: 03/08/20  4:42 AM  Result Value Ref  Range   WBC 15.4 (H) 4.0 - 10.5 K/uL   RBC 2.93 (L) 3.87 - 5.11 MIL/uL   Hemoglobin 9.7 (L) 12.0 - 15.0 g/dL   HCT 30.5 (L) 36.0 - 46.0 %   MCV 104.1 (H) 80.0 - 100.0 fL   MCH 33.1 26.0 - 34.0 pg   MCHC 31.8 30.0 - 36.0 g/dL   RDW 15.9 (H) 11.5 - 15.5 %   Platelets 414 (H) 150 - 400 K/uL   nRBC 0.0 0.0 - 0.2 %    Comment: Performed at [facility], [address] [place], [place] [address]  Comprehensive metabolic panel     Status: Abnormal   Collection Time: 03/08/20  6:17 AM  Result Value Ref Range   Sodium 133 (L) 135 - 145 mmol/L   Potassium 5.1 3.5 - 5.1 mmol/L   Chloride 104 98 - 111 mmol/L   CO2 22 22 - 32 mmol/L   Glucose, Bld 363 (H) 70 - 99 mg/dL    Comment: Glucose reference range applies only to samples taken after fasting for at least 8 hours.   BUN 34 (H) 8 - 23 mg/dL   Creatinine, Ser 1.59 (H) 0.44 - 1.00 mg/dL   Calcium 8.2 (L) 8.9 - 10.3 mg/dL   Total Protein 4.7 (L) 6.5 - 8.1 g/dL   Albumin 1.7 (L) 3.5 - 5.0 g/dL   AST 27 15 - 41 U/L   ALT 29 0 - 44 U/L   Alkaline Phosphatase 201 (H) 38 - 126 U/L   Total Bilirubin 0.8 0.3 - 1.2 mg/dL   GFR, Estimated 33 (L) >60 mL/min    Comment: (NOTE) Calculated using the CKD-EPI Creatinine Equation (2021)    Anion gap 7 5 - 15    Comment: Performed at [facility], [address] [place], [place] [address]  Magnesium     Status: None   Collection Time: 03/08/20  6:17 AM  Result Value Ref Range   Magnesium 1.7 1.7 - 2.4 mg/dL    Comment: Performed at [facility], [address] [place], [place] [address]  Phosphorus     Status: None   Collection Time: 03/08/20  6:17 AM  Result Value Ref Range   Phosphorus 3.5 2.5 - 4.6 mg/dL    Comment: Performed at [facility], [address] [place], [place] [address]  Glucose, capillary     Status: Abnormal   Collection Time: 03/08/20  6:28 AM  Result Value Ref Range   Glucose-Capillary 321 (H) 70 - 99 mg/dL  Comment: Glucose  reference range applies only to samples taken after fasting for at least 8 hours.  Glucose, capillary     Status: Abnormal   Collection Time: 03/08/20 11:53 AM  Result Value Ref Range   Glucose-Capillary 272 (H) 70 - 99 mg/dL    Comment: Glucose reference range applies only to samples taken after fasting for at least 8 hours.   Comment 1 Notify RN   Glucose, capillary     Status: Abnormal   Collection Time: 03/08/20  3:38 PM  Result Value Ref Range   Glucose-Capillary 291 (H) 70 - 99 mg/dL    Comment: Glucose reference range applies only to samples taken after fasting for at least 8 hours.   Comment 1 Notify RN   Glucose, capillary     Status: Abnormal   Collection Time: 03/08/20  7:47 PM  Result Value Ref Range   Glucose-Capillary 212 (H) 70 - 99 mg/dL    Comment: Glucose reference range applies only to samples taken after fasting for at least 8 hours.  Glucose, capillary     Status: Abnormal   Collection Time: 03/09/20 12:05 AM  Result Value Ref Range   Glucose-Capillary 144 (H) 70 - 99 mg/dL    Comment: Glucose reference range applies only to samples taken after fasting for at least 8 hours.  Glucose, capillary     Status: Abnormal   Collection Time: 03/09/20 12:15 AM  Result Value Ref Range   Glucose-Capillary 147 (H) 70 - 99 mg/dL    Comment: Glucose reference range applies only to samples taken after fasting for at least 8 hours.  Magnesium     Status: None   Collection Time: 03/09/20  4:09 AM  Result Value Ref Range   Magnesium 2.2 1.7 - 2.4 mg/dL    Comment: Performed at [facility], [address] [place], [place] [address]  Basic metabolic panel     Status: Abnormal   Collection Time: 03/09/20  4:09 AM  Result Value Ref Range   Sodium 139 135 - 145 mmol/L   Potassium 4.0 3.5 - 5.1 mmol/L   Chloride 107 98 - 111 mmol/L   CO2 24 22 - 32 mmol/L   Glucose, Bld 181 (H) 70 - 99 mg/dL    Comment: Glucose reference range applies only to samples taken after  fasting for at least 8 hours.   BUN 34 (H) 8 - 23 mg/dL   Creatinine, Ser 1.61 (H) 0.44 - 1.00 mg/dL   Calcium 8.6 (L) 8.9 - 10.3 mg/dL   GFR, Estimated 32 (L) >60 mL/min    Comment: (NOTE) Calculated using the CKD-EPI Creatinine Equation (2021)    Anion gap 8 5 - 15    Comment: Performed at [facility], [address] [place], [place] [address]  Glucose, capillary     Status: Abnormal   Collection Time: 03/09/20  4:17 AM  Result Value Ref Range   Glucose-Capillary 160 (H) 70 - 99 mg/dL    Comment: Glucose reference range applies only to samples taken after fasting for at least 8 hours.  Glucose, capillary     Status: Abnormal   Collection Time: 03/09/20  9:40 AM  Result Value Ref Range   Glucose-Capillary 175 (H) 70 - 99 mg/dL    Comment: Glucose reference range applies only to samples taken after fasting for at least 8 hours.  Glucose, capillary     Status: Abnormal   Collection Time: 03/09/20 12:06 PM  Result Value  Ref Range   Glucose-Capillary 229 (H) 70 - 99 mg/dL    Comment: Glucose reference range applies only to samples taken after fasting for at least 8 hours.  Glucose, capillary     Status: Abnormal   Collection Time: 03/09/20  4:25 PM  Result Value Ref Range   Glucose-Capillary 143 (H) 70 - 99 mg/dL    Comment: Glucose reference range applies only to samples taken after fasting for at least 8 hours.  Glucose, capillary     Status: Abnormal   Collection Time: 03/10/20 12:17 AM  Result Value Ref Range   Glucose-Capillary 212 (H) 70 - 99 mg/dL    Comment: Glucose reference range applies only to samples taken after fasting for at least 8 hours.  Basic metabolic panel     Status: Abnormal   Collection Time: 03/10/20  4:16 AM  Result Value Ref Range   Sodium 140 135 - 145 mmol/L   Potassium 3.7 3.5 - 5.1 mmol/L   Chloride 107 98 - 111 mmol/L   CO2 24 22 - 32 mmol/L   Glucose, Bld 206 (H) 70 - 99 mg/dL    Comment: Glucose reference range applies only to  samples taken after fasting for at least 8 hours.   BUN 32 (H) 8 - 23 mg/dL   Creatinine, Ser 1.57 (H) 0.44 - 1.00 mg/dL   Calcium 8.4 (L) 8.9 - 10.3 mg/dL   GFR, Estimated 33 (L) >60 mL/min    Comment: (NOTE) Calculated using the CKD-EPI Creatinine Equation (2021)    Anion gap 9 5 - 15    Comment: Performed at [facility], [address] [place], [place] [address]  Glucose, capillary     Status: Abnormal   Collection Time: 03/10/20  4:58 AM  Result Value Ref Range   Glucose-Capillary 205 (H) 70 - 99 mg/dL    Comment: Glucose reference range applies only to samples taken after fasting for at least 8 hours.  Glucose, capillary     Status: Abnormal   Collection Time: 03/10/20  7:38 AM  Result Value Ref Range   Glucose-Capillary 196 (H) 70 - 99 mg/dL    Comment: Glucose reference range applies only to samples taken after fasting for at least 8 hours.   Comment 1 Notify RN   Glucose, capillary     Status: Abnormal   Collection Time: 03/10/20  2:19 PM  Result Value Ref Range   Glucose-Capillary 199 (H) 70 - 99 mg/dL    Comment: Glucose reference range applies only to samples taken after fasting for at least 8 hours.  Glucose, capillary     Status: Abnormal   Collection Time: 03/10/20  5:25 PM  Result Value Ref Range   Glucose-Capillary 191 (H) 70 - 99 mg/dL    Comment: Glucose reference range applies only to samples taken after fasting for at least 8 hours.  Glucose, capillary     Status: Abnormal   Collection Time: 03/10/20  7:58 PM  Result Value Ref Range   Glucose-Capillary 160 (H) 70 - 99 mg/dL    Comment: Glucose reference range applies only to samples taken after fasting for at least 8 hours.  Glucose, capillary     Status: Abnormal   Collection Time: 03/11/20  4:12 AM  Result Value Ref Range   Glucose-Capillary 149 (H) 70 - 99 mg/dL    Comment: Glucose reference range applies only to samples taken after fasting for at least 8 hours.  CBC  Status: Abnormal    Collection Time: 03/11/20  5:38 AM  Result Value Ref Range   WBC 12.9 (H) 4.0 - 10.5 K/uL   RBC 2.51 (L) 3.87 - 5.11 MIL/uL   Hemoglobin 8.1 (L) 12.0 - 15.0 g/dL   HCT 24.5 (L) 36.0 - 46.0 %   MCV 97.6 80.0 - 100.0 fL   MCH 32.3 26.0 - 34.0 pg   MCHC 33.1 30.0 - 36.0 g/dL   RDW 15.0 11.5 - 15.5 %   Platelets 499 (H) 150 - 400 K/uL   nRBC 0.0 0.0 - 0.2 %    Comment: Performed at [facility], [address] [place], [place] [address]  Glucose, capillary     Status: Abnormal   Collection Time: 03/11/20  8:17 AM  Result Value Ref Range   Glucose-Capillary 116 (H) 70 - 99 mg/dL    Comment: Glucose reference range applies only to samples taken after fasting for at least 8 hours.  Creatinine, serum     Status: Abnormal   Collection Time: 03/11/20 10:18 AM  Result Value Ref Range   Creatinine, Ser 1.50 (H) 0.44 - 1.00 mg/dL   GFR, Estimated 35 (L) >60 mL/min    Comment: (NOTE) Calculated using the CKD-EPI Creatinine Equation (2021) Performed at [facility], [address] [place], [place] [address]   Glucose, capillary     Status: Abnormal   Collection Time: 03/11/20 12:16 PM  Result Value Ref Range   Glucose-Capillary 126 (H) 70 - 99 mg/dL    Comment: Glucose reference range applies only to samples taken after fasting for at least 8 hours.   Comment 1 Notify RN   Glucose, capillary     Status: Abnormal   Collection Time: 03/11/20  3:34 PM  Result Value Ref Range   Glucose-Capillary 114 (H) 70 - 99 mg/dL    Comment: Glucose reference range applies only to samples taken after fasting for at least 8 hours.  Comprehensive metabolic panel     Status: Abnormal   Collection Time: 03/12/20  1:50 AM  Result Value Ref Range   Sodium 138 135 - 145 mmol/L   Potassium 3.6 3.5 - 5.1 mmol/L   Chloride 104 98 - 111 mmol/L   CO2 25 22 - 32 mmol/L   Glucose, Bld 234 (H) 70 - 99 mg/dL    Comment: Glucose reference range applies only to samples taken after fasting for at  least 8 hours.   BUN 30 (H) 8 - 23 mg/dL   Creatinine, Ser 1.44 (H) 0.44 - 1.00 mg/dL   Calcium 8.4 (L) 8.9 - 10.3 mg/dL   Total Protein 5.4 (L) 6.5 - 8.1 g/dL   Albumin 1.8 (L) 3.5 - 5.0 g/dL   AST 23 15 - 41 U/L   ALT 29 0 - 44 U/L   Alkaline Phosphatase 182 (H) 38 - 126 U/L   Total Bilirubin 0.8 0.3 - 1.2 mg/dL   GFR, Estimated 37 (L) >60 mL/min    Comment: (NOTE) Calculated using the CKD-EPI Creatinine Equation (2021)    Anion gap 9 5 - 15    Comment: Performed at [facility], [address] [place], [place] [address]  Magnesium     Status: None   Collection Time: 03/12/20  1:50 AM  Result Value Ref Range   Magnesium 1.8 1.7 - 2.4 mg/dL    Comment: Performed at [facility], [address], [place], [place] [address]  Triglycerides  Status: Abnormal   Collection Time: 03/12/20  5:49 AM  Result Value Ref Range   Triglycerides 348 (H) <150 mg/dL    Comment: Performed at [facility], [address] [place], [place] [address]  Prealbumin     Status: None   Collection Time: 03/12/20  5:49 AM  Result Value Ref Range   Prealbumin 18.1 18 - 38 mg/dL    Comment: Performed at [facility] [address] [place], [place] [address]  Glucose, capillary     Status: Abnormal   Collection Time: 03/12/20  7:53 AM  Result Value Ref Range   Glucose-Capillary 251 (H) 70 - 99 mg/dL    Comment: Glucose reference range applies only to samples taken after fasting for at least 8 hours.   Comment 1 Notify RN   CBC with Differential/Platelet     Status: Abnormal   Collection Time: 03/12/20 10:29 AM  Result Value Ref Range   WBC 11.1 (H) 4.0 - 10.5 K/uL   RBC 2.40 (L) 3.87 - 5.11 MIL/uL   Hemoglobin 7.6 (L) 12.0 - 15.0 g/dL   HCT 23.4 (L) 36.0 - 46.0 %   MCV 97.5 80.0 - 100.0 fL   MCH 31.7 26.0 - 34.0 pg   MCHC 32.5 30.0 - 36.0 g/dL   RDW 14.6 11.5 - 15.5 %   Platelets 505 (H) 150 - 400 K/uL   nRBC 0.0 0.0 - 0.2 %   Neutrophils Relative % 82 %    Neutro Abs 9.2 (H) 1.7 - 7.7 K/uL   Lymphocytes Relative 5 %   Lymphs Abs 0.6 (L) 0.7 - 4.0 K/uL   Monocytes Relative 5 %   Monocytes Absolute 0.6 0.1 - 1.0 K/uL   Eosinophils Relative 4 %   Eosinophils Absolute 0.5 0.0 - 0.5 K/uL   Basophils Relative 1 %   Basophils Absolute 0.1 0.0 - 0.1 K/uL   Immature Granulocytes 3 %   Abs Immature Granulocytes 0.32 (H) 0.00 - 0.07 K/uL    Comment: Performed at [facility], [address] [place], [place] [address]  Comprehensive metabolic panel     Status: Abnormal   Collection Time: 03/12/20 10:29 AM  Result Value Ref Range   Sodium 142 135 - 145 mmol/L   Potassium 3.6 3.5 - 5.1 mmol/L   Chloride 109 98 - 111 mmol/L   CO2 26 22 - 32 mmol/L   Glucose, Bld 180 (H) 70 - 99 mg/dL    Comment: Glucose reference range applies only to samples taken after fasting for at least 8 hours.   BUN 29 (H) 8 - 23 mg/dL   Creatinine, Ser 1.34 (H) 0.44 - 1.00 mg/dL   Calcium 8.5 (L) 8.9 - 10.3 mg/dL   Total Protein 5.4 (L) 6.5 - 8.1 g/dL   Albumin 1.8 (L) 3.5 - 5.0 g/dL   AST 20 15 - 41 U/L   ALT 28 0 - 44 U/L   Alkaline Phosphatase 159 (H) 38 - 126 U/L   Total Bilirubin 0.8 0.3 - 1.2 mg/dL   GFR, Estimated 40 (L) >60 mL/min    Comment: (NOTE) Calculated using the CKD-EPI Creatinine Equation (2021)    Anion gap 7 5 - 15    Comment: Performed at [facility], [address] [place], [place] [address]  Phosphorus     Status: None   Collection Time: 03/12/20 10:29 AM  Result Value Ref Range   Phosphorus 2.5 2.5 - 4.6 mg/dL  Comment: Performed at [facility], [address] [place], [place] [address]  Glucose, capillary     Status: Abnormal   Collection Time: 03/12/20 12:06 PM  Result Value Ref Range   Glucose-Capillary 124 (H) 70 - 99 mg/dL    Comment: Glucose reference range applies only to samples taken after fasting for at least 8 hours.   Comment 1 Notify RN   Glucose, capillary     Status: Abnormal    Collection Time: 03/12/20  4:59 PM  Result Value Ref Range   Glucose-Capillary 238 (H) 70 - 99 mg/dL    Comment: Glucose reference range applies only to samples taken after fasting for at least 8 hours.  Glucose, capillary     Status: Abnormal   Collection Time: 03/12/20 11:13 PM  Result Value Ref Range   Glucose-Capillary 184 (H) 70 - 99 mg/dL    Comment: Glucose reference range applies only to samples taken after fasting for at least 8 hours.  Glucose, capillary     Status: Abnormal   Collection Time: 03/13/20  5:15 AM  Result Value Ref Range   Glucose-Capillary 186 (H) 70 - 99 mg/dL    Comment: Glucose reference range applies only to samples taken after fasting for at least 8 hours.  Glucose, capillary     Status: Abnormal   Collection Time: 03/13/20  7:55 AM  Result Value Ref Range   Glucose-Capillary 147 (H) 70 - 99 mg/dL    Comment: Glucose reference range applies only to samples taken after fasting for at least 8 hours.   Comment 1 Notify RN   Glucose, capillary     Status: Abnormal   Collection Time: 03/13/20  6:00 PM  Result Value Ref Range   Glucose-Capillary 175 (H) 70 - 99 mg/dL    Comment: Glucose reference range applies only to samples taken after fasting for at least 8 hours.   Comment 1 Notify RN   Glucose, capillary     Status: Abnormal   Collection Time: 03/14/20 12:33 AM  Result Value Ref Range   Glucose-Capillary 217 (H) 70 - 99 mg/dL    Comment: Glucose reference range applies only to samples taken after fasting for at least 8 hours.  Basic metabolic panel     Status: Abnormal   Collection Time: 03/14/20  4:30 AM  Result Value Ref Range   Sodium 142 135 - 145 mmol/L   Potassium 3.7 3.5 - 5.1 mmol/L   Chloride 105 98 - 111 mmol/L   CO2 28 22 - 32 mmol/L   Glucose, Bld 150 (H) 70 - 99 mg/dL    Comment: Glucose reference range applies only to samples taken after fasting for at least 8 hours.   BUN 28 (H) 8 - 23 mg/dL   Creatinine, Ser 1.30 (H) 0.44 - 1.00  mg/dL   Calcium 8.2 (L) 8.9 - 10.3 mg/dL   GFR, Estimated 42 (L) >60 mL/min    Comment: (NOTE) Calculated using the CKD-EPI Creatinine Equation (2021)    Anion gap 9 5 - 15    Comment: Performed at [facility], [address] [place], [place] [address]  Magnesium     Status: None   Collection Time: 03/14/20  4:30 AM  Result Value Ref Range   Magnesium 2.1 1.7 - 2.4 mg/dL    Comment: Performed at [facility], [address] [place], [place] [address]  Triglycerides     Status: None   Collection Time: 03/14/20  4:30  AM  Result Value Ref Range   Triglycerides 144 <150 mg/dL    Comment: Performed at [facility], [address] [place], [place] [address]  Glucose, capillary     Status: Abnormal   Collection Time: 03/14/20  5:33 AM  Result Value Ref Range   Glucose-Capillary 174 (H) 70 - 99 mg/dL    Comment: Glucose reference range applies only to samples taken after fasting for at least 8 hours.  Glucose, capillary     Status: Abnormal   Collection Time: 03/14/20 12:00 PM  Result Value Ref Range   Glucose-Capillary 221 (H) 70 - 99 mg/dL    Comment: Glucose reference range applies only to samples taken after fasting for at least 8 hours.   Comment 1 Notify RN   Glucose, capillary     Status: Abnormal   Collection Time: 03/14/20  4:58 PM  Result Value Ref Range   Glucose-Capillary 168 (H) 70 - 99 mg/dL    Comment: Glucose reference range applies only to samples taken after fasting for at least 8 hours.   Comment 1 Notify RN   Glucose, capillary     Status: Abnormal   Collection Time: 03/15/20 12:55 AM  Result Value Ref Range   Glucose-Capillary 211 (H) 70 - 99 mg/dL    Comment: Glucose reference range applies only to samples taken after fasting for at least 8 hours.  Comprehensive metabolic panel     Status: Abnormal   Collection Time: 03/15/20  3:51 AM  Result Value Ref Range   Sodium 139 135 - 145 mmol/L   Potassium 4.6 3.5 - 5.1 mmol/L    Chloride 107 98 - 111 mmol/L   CO2 29 22 - 32 mmol/L   Glucose, Bld 230 (H) 70 - 99 mg/dL    Comment: Glucose reference range applies only to samples taken after fasting for at least 8 hours.   BUN 29 (H) 8 - 23 mg/dL   Creatinine, Ser 1.38 (H) 0.44 - 1.00 mg/dL   Calcium 8.5 (L) 8.9 - 10.3 mg/dL   Total Protein 5.4 (L) 6.5 - 8.1 g/dL   Albumin 1.8 (L) 3.5 - 5.0 g/dL   AST 72 (H) 15 - 41 U/L   ALT 97 (H) 0 - 44 U/L   Alkaline Phosphatase 167 (H) 38 - 126 U/L   Total Bilirubin 0.5 0.3 - 1.2 mg/dL   GFR, Estimated 39 (L) >60 mL/min    Comment: (NOTE) Calculated using the CKD-EPI Creatinine Equation (2021)    Anion gap 3 (L) 5 - 15    Comment: Performed at [facility], [address], [place], [place] [address]  Magnesium     Status: None   Collection Time: 03/15/20  3:51 AM  Result Value Ref Range   Magnesium 2.0 1.7 - 2.4 mg/dL    Comment: Performed at [facility], [address] [place], [place] [address]  Phosphorus     Status: None   Collection Time: 03/15/20  3:51 AM  Result Value Ref Range   Phosphorus 3.2 2.5 - 4.6 mg/dL    Comment: Performed at [facility], [address] [place], [place] [address]  Glucose, capillary     Status: Abnormal   Collection Time: 03/15/20  5:12 AM  Result Value Ref Range   Glucose-Capillary 211 (H) 70 - 99 mg/dL    Comment: Glucose reference range applies only to samples taken after fasting for at least 8 hours.  Glucose, capillary  Status: Abnormal   Collection Time: 03/15/20 11:35 AM  Result Value Ref Range   Glucose-Capillary 180 (H) 70 - 99 mg/dL    Comment: Glucose reference range applies only to samples taken after fasting for at least 8 hours.  Glucose, capillary     Status: Abnormal   Collection Time: 03/16/20  5:22 AM  Result Value Ref Range   Glucose-Capillary 190 (H) 70 - 99 mg/dL    Comment: Glucose reference range applies only to samples taken after fasting for at least 8 hours.  Basic  metabolic panel     Status: Abnormal   Collection Time: 03/16/20  6:18 AM  Result Value Ref Range   Sodium 137 135 - 145 mmol/L   Potassium 3.6 3.5 - 5.1 mmol/L   Chloride 103 98 - 111 mmol/L   CO2 26 22 - 32 mmol/L   Glucose, Bld 214 (H) 70 - 99 mg/dL    Comment: Glucose reference range applies only to samples taken after fasting for at least 8 hours.   BUN 27 (H) 8 - 23 mg/dL   Creatinine, Ser 1.09 (H) 0.44 - 1.00 mg/dL   Calcium 8.6 (L) 8.9 - 10.3 mg/dL   GFR, Estimated 52 (L) >60 mL/min    Comment: (NOTE) Calculated using the CKD-EPI Creatinine Equation (2021)    Anion gap 8 5 - 15    Comment: Performed at [facility], [address] [place], [place] [address]  Glucose, capillary     Status: Abnormal   Collection Time: 03/16/20 12:27 PM  Result Value Ref Range   Glucose-Capillary 195 (H) 70 - 99 mg/dL    Comment: Glucose reference range applies only to samples taken after fasting for at least 8 hours.  Glucose, capillary     Status: Abnormal   Collection Time: 03/16/20  3:02 PM  Result Value Ref Range   Glucose-Capillary 209 (H) 70 - 99 mg/dL    Comment: Glucose reference range applies only to samples taken after fasting for at least 8 hours.  Glucose, capillary     Status: Abnormal   Collection Time: 03/17/20 12:19 AM  Result Value Ref Range   Glucose-Capillary 179 (H) 70 - 99 mg/dL    Comment: Glucose reference range applies only to samples taken after fasting for at least 8 hours.  Glucose, capillary     Status: Abnormal   Collection Time: 03/17/20  6:32 AM  Result Value Ref Range   Glucose-Capillary 200 (H) 70 - 99 mg/dL    Comment: Glucose reference range applies only to samples taken after fasting for at least 8 hours.  Glucose, capillary     Status: Abnormal   Collection Time: 03/17/20  1:48 PM  Result Value Ref Range   Glucose-Capillary 129 (H) 70 - 99 mg/dL    Comment: Glucose reference range applies only to samples taken after fasting for at least 8  hours.  Glucose, capillary     Status: Abnormal   Collection Time: 03/18/20  5:41 AM  Result Value Ref Range   Glucose-Capillary 137 (H) 70 - 99 mg/dL    Comment: Glucose reference range applies only to samples taken after fasting for at least 8 hours.  Glucose, capillary     Status: None   Collection Time: 03/19/20  7:45 AM  Result Value Ref Range   Glucose-Capillary 92 70 - 99 mg/dL    Comment: Glucose reference range applies only to samples taken after fasting for at least 8 hours.  CBC  Status: Abnormal   Collection Time: 03/19/20  8:27 AM  Result Value Ref Range   WBC 6.6 4.0 - 10.5 K/uL   RBC 2.40 (L) 3.87 - 5.11 MIL/uL   Hemoglobin 7.8 (L) 12.0 - 15.0 g/dL   HCT 23.5 (L) 36.0 - 46.0 %   MCV 97.9 80.0 - 100.0 fL   MCH 32.5 26.0 - 34.0 pg   MCHC 33.2 30.0 - 36.0 g/dL   RDW 15.9 (H) 11.5 - 15.5 %   Platelets 542 (H) 150 - 400 K/uL   nRBC 0.0 0.0 - 0.2 %    Comment: Performed at [facility], [address] [place], [place] [address]  Comprehensive metabolic panel     Status: Abnormal   Collection Time: 03/19/20  8:27 AM  Result Value Ref Range   Sodium 131 (L) 135 - 145 mmol/L   Potassium 3.2 (L) 3.5 - 5.1 mmol/L   Chloride 99 98 - 111 mmol/L   CO2 26 22 - 32 mmol/L   Glucose, Bld 99 70 - 99 mg/dL    Comment: Glucose reference range applies only to samples taken after fasting for at least 8 hours.   BUN 18 8 - 23 mg/dL   Creatinine, Ser 1.14 (H) 0.44 - 1.00 mg/dL   Calcium 8.5 (L) 8.9 - 10.3 mg/dL   Total Protein 5.8 (L) 6.5 - 8.1 g/dL   Albumin 2.2 (L) 3.5 - 5.0 g/dL   AST 38 15 - 41 U/L   ALT 92 (H) 0 - 44 U/L   Alkaline Phosphatase 166 (H) 38 - 126 U/L   Total Bilirubin 0.7 0.3 - 1.2 mg/dL   GFR, Estimated 49 (L) >60 mL/min    Comment: (NOTE) Calculated using the CKD-EPI Creatinine Equation (2021)    Anion gap 6 5 - 15    Comment: Performed at [facility], [address] [place], [place] [address]  Vitamin B12     Status: None    Collection Time: 03/19/20  2:38 PM  Result Value Ref Range   Vitamin B-12 712 180 - 914 pg/mL    Comment: (NOTE) This assay is not validated for testing neonatal or myeloproliferative syndrome specimens for Vitamin B12 levels. Performed at [facility], [address] [place], [place] [address]   Folate     Status: None   Collection Time: 03/19/20  2:38 PM  Result Value Ref Range   Folate 18.9 >5.9 ng/mL    Comment: Performed at [facility], [address] [place], [place] [address]  Iron and TIBC     Status: Abnormal   Collection Time: 03/19/20  2:38 PM  Result Value Ref Range   Iron 29 28 - 170 ug/dL   TIBC 287 250 - 450 ug/dL   Saturation Ratios 10 (L) 10.4 - 31.8 %   UIBC 258 ug/dL    Comment: Performed at [facility], [address] [place], [place] [address]  Ferritin     Status: Abnormal   Collection Time: 03/19/20  2:38 PM  Result Value Ref Range   Ferritin 553 (H) 11 - 307 ng/mL    Comment: Performed at [facility], [address] [place], [place] [address]  Reticulocytes     Status: Abnormal   Collection Time: 03/19/20  2:38 PM  Result Value Ref Range   Retic Ct Pct 4.7 (H) 0.4 - 3.1 %   RBC. 2.78 (L) 3.87 - 5.11 MIL/uL   Retic Count, Absolute  131.5 19.0 - 186.0 K/uL   Immature Retic Fract 27.6 (H) 2.3 - 15.9 %    Comment: Performed at [facility], [address] [place], [place] [address]  Basic metabolic panel     Status: Abnormal   Collection Time: 03/20/20  6:54 AM  Result Value Ref Range   Sodium 134 (L) 135 - 145 mmol/L   Potassium 3.9 3.5 - 5.1 mmol/L   Chloride 102 98 - 111 mmol/L   CO2 24 22 - 32 mmol/L   Glucose, Bld 82 70 - 99 mg/dL    Comment: Glucose reference range applies only to samples taken after fasting for at least 8 hours.   BUN 17 8 - 23 mg/dL   Creatinine, Ser 1.23 (H) 0.44 - 1.00 mg/dL   Calcium 8.4 (L) 8.9 - 10.3 mg/dL   GFR, Estimated 45 (L) >60 mL/min    Comment:  (NOTE) Calculated using the CKD-EPI Creatinine Equation (2021)    Anion gap 8 5 - 15    Comment: Performed at [facility], [address] [place], [place] [address]  CBC and differential     Status: Abnormal   Collection Time: 03/29/20 12:00 AM  Result Value Ref Range   Hemoglobin 9.8 (A) 12.0 - 16.0   HCT 29 (A) 36 - 46   Neutrophils Absolute 3.10    Platelets 444 (A) 150 - 399   WBC 4.7   CBC     Status: Abnormal   Collection Time: 03/29/20 12:00 AM  Result Value Ref Range   RBC 2.95 (A) 3.87 - 0.98  Basic metabolic panel     Status: Abnormal   Collection Time: 03/29/20 12:00 AM  Result Value Ref Range   Glucose 93    BUN 19 4 - 21   CO2 23 (A) 13 - 22   Creatinine 1.4 (A) 0.5 - 1.1   Potassium 3.7 3.4 - 5.3   Sodium 135 (A) 137 - 147   Chloride 96 (A) 99 - 108  Comprehensive metabolic panel     Status: None   Collection Time: 03/29/20 12:00 AM  Result Value Ref Range   GFR calc non Af Amer 36    Calcium 9.4 8.7 - 10.7    -------------------------------------------------------------------------- A&P:  Problem List Items Addressed This Visit   None   Visit Diagnoses    Nausea    -  Primary   Colostomy status ([place])       History of diverticulitis       S/P partial colectomy         S/p partial colectomy and colostomy 03/07/20 following acute diverticulitis flare hospitalization Improving after discharge from hospital now Labs done after discharge, reviewed Has home health nursing and ostomy care, assistance with family Followed by [facility] Dr Windell Moment, next f/u 2 weeks Continues to improve gradually symptoms and PO intake and bowel regimen No further complications or concerns  No orders of the defined types were placed in this encounter.   Follow-up: - Return PRN  Patient verbalizes understanding with the above medical recommendations including the limitation of remote medical advice.  Specific follow-up and call-back criteria were  given for patient to follow-up or seek medical care more urgently if needed.   - Time spent in direct consultation with patient on phone: 15 minutes   [name], [facility] 04/13/2020, 9:28 AM

## 2020-04-13 NOTE — Patient Instructions (Addendum)
° °  Please schedule a Follow-up Appointment to: Return if symptoms worsen or fail to improve.  If you have any other questions or concerns, please feel free to call the office or send a message through [place]. You may also schedule an earlier appointment if necessary.  Additionally, you may be receiving a survey about your experience at our office within a few days to 1 week by e-mail or mail. We value your feedback.  [name], DO [place]

## 2020-04-19 DIAGNOSIS — I2510 Atherosclerotic heart disease of native coronary artery without angina pectoris: Secondary | ICD-10-CM | POA: Diagnosis not present

## 2020-04-19 DIAGNOSIS — Z48815 Encounter for surgical aftercare following surgery on the digestive system: Secondary | ICD-10-CM | POA: Diagnosis not present

## 2020-04-19 DIAGNOSIS — J449 Chronic obstructive pulmonary disease, unspecified: Secondary | ICD-10-CM | POA: Diagnosis not present

## 2020-04-19 DIAGNOSIS — Z433 Encounter for attention to colostomy: Secondary | ICD-10-CM | POA: Diagnosis not present

## 2020-04-19 DIAGNOSIS — I1310 Hypertensive heart and chronic kidney disease without heart failure, with stage 1 through stage 4 chronic kidney disease, or unspecified chronic kidney disease: Secondary | ICD-10-CM | POA: Diagnosis not present

## 2020-04-19 DIAGNOSIS — N184 Chronic kidney disease, stage 4 (severe): Secondary | ICD-10-CM | POA: Diagnosis not present

## 2020-04-21 DIAGNOSIS — M47816 Spondylosis without myelopathy or radiculopathy, lumbar region: Secondary | ICD-10-CM | POA: Diagnosis not present

## 2020-04-21 DIAGNOSIS — I700 Atherosclerosis of aorta: Secondary | ICD-10-CM | POA: Diagnosis not present

## 2020-04-21 DIAGNOSIS — E269 Hyperaldosteronism, unspecified: Secondary | ICD-10-CM | POA: Diagnosis not present

## 2020-04-21 DIAGNOSIS — K5790 Diverticulosis of intestine, part unspecified, without perforation or abscess without bleeding: Secondary | ICD-10-CM | POA: Diagnosis not present

## 2020-04-21 DIAGNOSIS — G4700 Insomnia, unspecified: Secondary | ICD-10-CM | POA: Diagnosis not present

## 2020-04-21 DIAGNOSIS — M359 Systemic involvement of connective tissue, unspecified: Secondary | ICD-10-CM | POA: Diagnosis not present

## 2020-04-21 DIAGNOSIS — I272 Pulmonary hypertension, unspecified: Secondary | ICD-10-CM | POA: Diagnosis not present

## 2020-04-21 DIAGNOSIS — F419 Anxiety disorder, unspecified: Secondary | ICD-10-CM | POA: Diagnosis not present

## 2020-04-21 DIAGNOSIS — I051 Rheumatic mitral insufficiency: Secondary | ICD-10-CM | POA: Diagnosis not present

## 2020-04-21 DIAGNOSIS — N179 Acute kidney failure, unspecified: Secondary | ICD-10-CM | POA: Diagnosis not present

## 2020-04-21 DIAGNOSIS — Z433 Encounter for attention to colostomy: Secondary | ICD-10-CM | POA: Diagnosis not present

## 2020-04-21 DIAGNOSIS — H5462 Unqualified visual loss, left eye, normal vision right eye: Secondary | ICD-10-CM | POA: Diagnosis not present

## 2020-04-21 DIAGNOSIS — N184 Chronic kidney disease, stage 4 (severe): Secondary | ICD-10-CM | POA: Diagnosis not present

## 2020-04-21 DIAGNOSIS — M329 Systemic lupus erythematosus, unspecified: Secondary | ICD-10-CM | POA: Diagnosis not present

## 2020-04-21 DIAGNOSIS — J449 Chronic obstructive pulmonary disease, unspecified: Secondary | ICD-10-CM | POA: Diagnosis not present

## 2020-04-21 DIAGNOSIS — Z48815 Encounter for surgical aftercare following surgery on the digestive system: Secondary | ICD-10-CM | POA: Diagnosis not present

## 2020-04-21 DIAGNOSIS — I2510 Atherosclerotic heart disease of native coronary artery without angina pectoris: Secondary | ICD-10-CM | POA: Diagnosis not present

## 2020-04-21 DIAGNOSIS — K589 Irritable bowel syndrome without diarrhea: Secondary | ICD-10-CM | POA: Diagnosis not present

## 2020-04-21 DIAGNOSIS — M8580 Other specified disorders of bone density and structure, unspecified site: Secondary | ICD-10-CM | POA: Diagnosis not present

## 2020-04-21 DIAGNOSIS — D631 Anemia in chronic kidney disease: Secondary | ICD-10-CM | POA: Diagnosis not present

## 2020-04-21 DIAGNOSIS — I1310 Hypertensive heart and chronic kidney disease without heart failure, with stage 1 through stage 4 chronic kidney disease, or unspecified chronic kidney disease: Secondary | ICD-10-CM | POA: Diagnosis not present

## 2020-04-21 DIAGNOSIS — K219 Gastro-esophageal reflux disease without esophagitis: Secondary | ICD-10-CM | POA: Diagnosis not present

## 2020-04-21 DIAGNOSIS — H348122 Central retinal vein occlusion, left eye, stable: Secondary | ICD-10-CM | POA: Diagnosis not present

## 2020-04-21 DIAGNOSIS — K7689 Other specified diseases of liver: Secondary | ICD-10-CM | POA: Diagnosis not present

## 2020-04-21 DIAGNOSIS — H6990 Unspecified Eustachian tube disorder, unspecified ear: Secondary | ICD-10-CM | POA: Diagnosis not present

## 2020-04-24 DIAGNOSIS — N1831 Chronic kidney disease, stage 3a: Secondary | ICD-10-CM | POA: Diagnosis not present

## 2020-04-24 DIAGNOSIS — E871 Hypo-osmolality and hyponatremia: Secondary | ICD-10-CM | POA: Diagnosis not present

## 2020-04-24 DIAGNOSIS — N2581 Secondary hyperparathyroidism of renal origin: Secondary | ICD-10-CM | POA: Diagnosis not present

## 2020-04-24 DIAGNOSIS — I10 Essential (primary) hypertension: Secondary | ICD-10-CM | POA: Diagnosis not present

## 2020-04-24 DIAGNOSIS — D631 Anemia in chronic kidney disease: Secondary | ICD-10-CM | POA: Diagnosis not present

## 2020-04-24 DIAGNOSIS — N1832 Chronic kidney disease, stage 3b: Secondary | ICD-10-CM | POA: Diagnosis not present

## 2020-04-24 NOTE — Chronic Care Management (AMB) (Signed)
Care Management   Note  04/24/2020 Name: Margaret Black MRN: [id number] DOB: [birth year]  Margaret Black is a 80 y.o. year old female who is a primary care patient of Smitty Cords, DO and is actively engaged with the care management team. I reached out to Margaret Black by phone today to assist with re-scheduling an initial visit with the Licensed Clinical Social Worker  Follow up plan: Telephone appointment with care management team member scheduled for:04/25/2020  Penne Lash, [place] Care Guide, Embedded Care Coordination [facility]  [place], [place] [address] Direct Dial: [phone] [email or web address]: [email or web address]

## 2020-04-25 ENCOUNTER — Ambulatory Visit: Payer: Medicare Other | Admitting: Licensed Clinical Social Worker

## 2020-04-25 NOTE — Chronic Care Management (AMB) (Signed)
Care Management   Follow Up Note   04/25/2020 Name: Margaret Black MRN: [id number] DOB: 09-Jul-1940    Referred by: Smitty Cords, DO Reason for referral : Care Coordination   Margaret Black is a 80 y.o. year old female who is a primary care patient of Smitty Cords, DO. The care management team was consulted for assistance with care management and care coordination needs.    Review of patient status, including review of consultants reports, relevant laboratory and other test results, and collaboration with appropriate care team members and the patient's provider was performed as part of comprehensive patient evaluation and provision of chronic care management services.    CCM LCSW completed outreach call for social work support and successfully spoke with patient on 04/25/20. Patient reports that she does not wish to talk about her physical or mental health care with CCM LCSW and rather wait to discuss her concerns directly with PCP. CCM LCSW expressed understanding and offered emotional support. Reflective listening was provided during phone call as well. However, patient declines wanting CCM Services at this time and reports being frustrated with the current level of care she is receiving at [facility] as she has been unable to get any follow up or assistance. CCM LCSW ask patient if there were anything that CCM LCSW could do (contact office directly, message CCM team, send resources, provide education or ask our NP who is providing coverage for support) to make her situation better and she declined. CCM LCSW will update PCP. CCM LCSW will complete case closure.  Dickie La, BSW, MSW, LCSW [facility]  [facility] [email or web address] Phone: [phone]

## 2020-04-26 ENCOUNTER — Telehealth: Payer: Self-pay | Admitting: Family Medicine

## 2020-04-26 DIAGNOSIS — N184 Chronic kidney disease, stage 4 (severe): Secondary | ICD-10-CM | POA: Diagnosis not present

## 2020-04-26 DIAGNOSIS — I1310 Hypertensive heart and chronic kidney disease without heart failure, with stage 1 through stage 4 chronic kidney disease, or unspecified chronic kidney disease: Secondary | ICD-10-CM | POA: Diagnosis not present

## 2020-04-26 DIAGNOSIS — I2510 Atherosclerotic heart disease of native coronary artery without angina pectoris: Secondary | ICD-10-CM | POA: Diagnosis not present

## 2020-04-26 DIAGNOSIS — Z433 Encounter for attention to colostomy: Secondary | ICD-10-CM | POA: Diagnosis not present

## 2020-04-26 DIAGNOSIS — Z48815 Encounter for surgical aftercare following surgery on the digestive system: Secondary | ICD-10-CM | POA: Diagnosis not present

## 2020-04-26 DIAGNOSIS — J449 Chronic obstructive pulmonary disease, unspecified: Secondary | ICD-10-CM | POA: Diagnosis not present

## 2020-04-26 MED ORDER — LORAZEPAM 0.5 MG PO TABS: 0.5000 mg | ORAL_TABLET | Freq: Every day | ORAL | 1 refills | Status: AC | PRN

## 2020-04-26 MED ORDER — SERTRALINE HCL 25 MG PO TABS
25.0000 mg | ORAL_TABLET | Freq: Every day | ORAL | 0 refills | Status: DC
Start: 2020-04-26 — End: 2020-05-19

## 2020-04-26 NOTE — Telephone Encounter (Signed)
I'm not sure what they are asking for. It sounds like patient needs a virtual visit to discuss needs.

## 2020-04-26 NOTE — Telephone Encounter (Signed)
Diane with [facility] called saying patient is recovering from surgery and has not been out of her house in a month.  She wants o know is there something that can be given to help her with that.  CB#  [phone]

## 2020-04-26 NOTE — Telephone Encounter (Signed)
I called and spoke with the [facility] home health nurse concerning the previous message. She informed me that the patient been having new onset panic attacks x 1 week. The patient cancelled all her upcoming appointment.  Diane, RN said she is nervous to walk to the mailbox.  The pt had an appt with her Nephrologist x 2 days ago and was referred to psychiatry. No appointment available until Tuesday.

## 2020-04-27 ENCOUNTER — Telehealth: Payer: Self-pay

## 2020-04-27 NOTE — Telephone Encounter (Signed)
The pt called back and informed me that she decided to start on the Zoloft 25MG  once daily. She will have her husband to pick up the medication today.

## 2020-04-27 NOTE — Telephone Encounter (Signed)
The patient decided to hold off at this time starting any medications. She admits that her anxiety have worsen, but she would not like to as this time start any new medications. She would rather wait until she sees the Psychiatrist. The patient is aware if she changes her mind to give [place] a call back.   I contacted Anderson Malta, RN for Dr. Holley Raring and she informed me that she will place the Psych referral STAT. She said she expect that they will call her with an appt first thing next week. I sent the patient a mychart message with this information.

## 2020-04-30 ENCOUNTER — Telehealth: Payer: Self-pay | Admitting: Family Medicine

## 2020-04-30 NOTE — Telephone Encounter (Signed)
Diane, Calling from [facility] calling to request a PT evaluation for the patient. CB- [phone] Ok to leave on Verbal VM.

## 2020-05-01 NOTE — Telephone Encounter (Signed)
I called Diane Physical therapist and verbal was given for a physical therapy evaluation.

## 2020-05-03 DIAGNOSIS — Z48815 Encounter for surgical aftercare following surgery on the digestive system: Secondary | ICD-10-CM | POA: Diagnosis not present

## 2020-05-03 DIAGNOSIS — Z433 Encounter for attention to colostomy: Secondary | ICD-10-CM | POA: Diagnosis not present

## 2020-05-03 DIAGNOSIS — N184 Chronic kidney disease, stage 4 (severe): Secondary | ICD-10-CM | POA: Diagnosis not present

## 2020-05-03 DIAGNOSIS — I1310 Hypertensive heart and chronic kidney disease without heart failure, with stage 1 through stage 4 chronic kidney disease, or unspecified chronic kidney disease: Secondary | ICD-10-CM | POA: Diagnosis not present

## 2020-05-03 DIAGNOSIS — J449 Chronic obstructive pulmonary disease, unspecified: Secondary | ICD-10-CM | POA: Diagnosis not present

## 2020-05-03 DIAGNOSIS — I2510 Atherosclerotic heart disease of native coronary artery without angina pectoris: Secondary | ICD-10-CM | POA: Diagnosis not present

## 2020-05-04 DIAGNOSIS — F322 Major depressive disorder, single episode, severe without psychotic features: Secondary | ICD-10-CM | POA: Diagnosis not present

## 2020-05-04 DIAGNOSIS — F4322 Adjustment disorder with anxiety: Secondary | ICD-10-CM | POA: Diagnosis not present

## 2020-05-04 DIAGNOSIS — F5105 Insomnia due to other mental disorder: Secondary | ICD-10-CM | POA: Diagnosis not present

## 2020-05-10 DIAGNOSIS — N184 Chronic kidney disease, stage 4 (severe): Secondary | ICD-10-CM | POA: Diagnosis not present

## 2020-05-10 DIAGNOSIS — Z48815 Encounter for surgical aftercare following surgery on the digestive system: Secondary | ICD-10-CM | POA: Diagnosis not present

## 2020-05-10 DIAGNOSIS — I2510 Atherosclerotic heart disease of native coronary artery without angina pectoris: Secondary | ICD-10-CM | POA: Diagnosis not present

## 2020-05-10 DIAGNOSIS — J449 Chronic obstructive pulmonary disease, unspecified: Secondary | ICD-10-CM | POA: Diagnosis not present

## 2020-05-10 DIAGNOSIS — Z433 Encounter for attention to colostomy: Secondary | ICD-10-CM | POA: Diagnosis not present

## 2020-05-10 DIAGNOSIS — I1310 Hypertensive heart and chronic kidney disease without heart failure, with stage 1 through stage 4 chronic kidney disease, or unspecified chronic kidney disease: Secondary | ICD-10-CM | POA: Diagnosis not present

## 2020-05-12 DIAGNOSIS — F5105 Insomnia due to other mental disorder: Secondary | ICD-10-CM | POA: Diagnosis not present

## 2020-05-12 DIAGNOSIS — F4322 Adjustment disorder with anxiety: Secondary | ICD-10-CM | POA: Diagnosis not present

## 2020-05-12 DIAGNOSIS — F322 Major depressive disorder, single episode, severe without psychotic features: Secondary | ICD-10-CM | POA: Diagnosis not present

## 2020-05-18 DIAGNOSIS — Z433 Encounter for attention to colostomy: Secondary | ICD-10-CM | POA: Diagnosis not present

## 2020-05-18 DIAGNOSIS — I2510 Atherosclerotic heart disease of native coronary artery without angina pectoris: Secondary | ICD-10-CM | POA: Diagnosis not present

## 2020-05-18 DIAGNOSIS — J449 Chronic obstructive pulmonary disease, unspecified: Secondary | ICD-10-CM | POA: Diagnosis not present

## 2020-05-18 DIAGNOSIS — N184 Chronic kidney disease, stage 4 (severe): Secondary | ICD-10-CM | POA: Diagnosis not present

## 2020-05-18 DIAGNOSIS — Z48815 Encounter for surgical aftercare following surgery on the digestive system: Secondary | ICD-10-CM | POA: Diagnosis not present

## 2020-05-18 DIAGNOSIS — I1310 Hypertensive heart and chronic kidney disease without heart failure, with stage 1 through stage 4 chronic kidney disease, or unspecified chronic kidney disease: Secondary | ICD-10-CM | POA: Diagnosis not present

## 2020-05-19 ENCOUNTER — Other Ambulatory Visit: Payer: Self-pay | Admitting: Unknown Physician Specialty

## 2020-05-19 NOTE — Telephone Encounter (Signed)
Requested Prescriptions  Pending Prescriptions Disp Refills  . sertraline (ZOLOFT) 25 MG tablet [Pharmacy Med Name: SERTRALINE HCL 25 MG TABLET] 30 tablet 0    Sig: TAKE 1 TABLET (25 MG TOTAL) BY MOUTH DAILY.     Psychiatry:  Antidepressants - SSRI Passed - 05/19/2020 11:30 AM      Passed - Valid encounter within last 6 months    Recent Outpatient Visits          1 month ago Nausea   [place], DO   3 months ago Diverticulitis   [place], DO   4 months ago Diverticulitis   [place], DO   6 months ago Acute non-recurrent maxillary sinusitis   [facility] Olin Hauser, DO   9 months ago Benign hypertension with CKD (chronic kidney disease) stage III   [place], Devonne Doughty, [place]

## 2020-05-24 DIAGNOSIS — F4322 Adjustment disorder with anxiety: Secondary | ICD-10-CM | POA: Diagnosis not present

## 2020-05-24 DIAGNOSIS — F322 Major depressive disorder, single episode, severe without psychotic features: Secondary | ICD-10-CM | POA: Diagnosis not present

## 2020-05-24 DIAGNOSIS — F5105 Insomnia due to other mental disorder: Secondary | ICD-10-CM | POA: Diagnosis not present

## 2020-05-28 ENCOUNTER — Telehealth: Payer: Self-pay

## 2020-05-28 NOTE — Telephone Encounter (Signed)
She has a four reading over the past couple of days where her reading have been elevated in the 170-180's and that's when the lightheadedness started.

## 2020-05-28 NOTE — Telephone Encounter (Signed)
Patient notified of recommendations. 

## 2020-05-28 NOTE — Telephone Encounter (Signed)
I am not comfortable making a change based off of one blood pressure reading.  I am concerned because if she is endorsing lightheadedness that could suggest a lower BP, but then her reading was higher. If we increase a BP med, then she may have worsening lightheadedness.  I would need to know what her BP had been running prior to the 181 reading.  As long as she remains asymptomatic and feels fine - then she can monitor it closely over next couple days, and notify us back with more readings.  Alternatively, she can schedule a follow-up in person or virtual to discuss BP medications in more detail if she still has questions.  [name], [facility] 05/28/2020, 10:38 AM

## 2020-05-28 NOTE — Telephone Encounter (Signed)
Patient called this morning wishing to have recommendations to change her BP meds because her reading this morning was 181/81..  She said that she had taken her meds but it wasn't too long before she checked it.  She said she was experiencing lightheadedness earlier but it has gone away.  She takes four different meds for her bp..  Enalapril Nifedipine Spironolactone Bystolic  Please advise if you wish to increase or decrease any meds and I will let her know, thanks!

## 2020-05-28 NOTE — Telephone Encounter (Signed)
She can start with doubling her Enalapril from 5mg  up to 10mg . She can take 2 Enalapril pills in evening = total dose of 10mg  for now.  I still think something else could be going on. This sounds similar to 08/2019 when she had high BP as well.  I would recommend close follow-up with her Nephrology specialist as well.  If still not improving, she can schedule follow-up with me.  [name], [facility] 05/28/2020, 11:59 AM

## 2020-06-01 ENCOUNTER — Ambulatory Visit: Payer: Self-pay | Admitting: *Deleted

## 2020-06-01 ENCOUNTER — Telehealth: Payer: Self-pay

## 2020-06-01 ENCOUNTER — Telehealth: Payer: Medicare Other | Admitting: Family Medicine

## 2020-06-01 DIAGNOSIS — R42 Dizziness and giddiness: Secondary | ICD-10-CM

## 2020-06-01 DIAGNOSIS — R110 Nausea: Secondary | ICD-10-CM

## 2020-06-01 MED ORDER — MECLIZINE HCL 25 MG PO TABS: 25.0000 mg | ORAL_TABLET | Freq: Three times a day (TID) | ORAL | 1 refills | Status: AC | PRN

## 2020-06-01 NOTE — Telephone Encounter (Signed)
Please let her know  Sent medicine for Meclizine for vertigo to [facility].  Optional for her to keep apt at 2pm.  She can always re-schedule for next week instead if not improved on medicine.  [name], [facility] 06/01/2020, 9:32 AM

## 2020-06-01 NOTE — Telephone Encounter (Signed)
Pt was transferred to me by the agent with c/o dizziness.  She asked who I was even after I had introduced myself and my role as the triage nurse.   "I'm having vertigo pretty bad and I need someone to write me a prescription or tell me what I can buy for it".    "Can you write me a prescription for this?"    I let her know I could not write her a prescription as an RN but I would be glad to get her set up with Dr. Parks Ranger.  "You can't write me a prescription?"   She raised her voice and said,  "I can't believe this".   "What a waste of my time".   "I don't know why he (agent) put me through to you".   "I don't know you or who you are".   "I want to talk with Apolonio Schneiders".    I let her know I would be glad to have Apolonio Schneiders give her a call (I have a glitch in my system where I can't put anyone on hold to transfer into the office.   IT to be contacted). I asked her if the number in her chart and I repeated it was the best number for Apolonio Schneiders to call her back.   She raised her voice and was very abrupt with me.  "You mean you can't connect me to Apolonio Schneiders?"  I explained to her my situation with the computer glitch but that I was going to call into the office right now and have Apolonio Schneiders give her a call.   "The guy I was talking to that transferred me to you (agent) can he transfer me in?"   I let her know he was disconnected from the line at this point but I am happy to get her message to Apolonio Schneiders in the office.   "Whatever you need to do" and hung up.  I called into [place] answered the phone.    I let her know the situation and that this pt was very upset with the whole process of the call and that I could not write her a prescription for the dizziness.   Apolonio Schneiders acknowledged she was very familiar with this pt and would call her back.   I thanked Apolonio Schneiders for her help.  I did send my note to [place] just for their information.  No protocol was used since I was unable to triage  her.  Reason for Disposition . [1] MODERATE dizziness (e.g., interferes with normal activities) AND [2] has NOT been evaluated by physician for this  (Exception: dizziness caused by heat exposure, sudden standing, or poor fluid intake)    See note for details of this call.  No protocol used.  Answer Assessment - Initial Assessment Questions 1. DESCRIPTION: "Describe your dizziness."     I've had vertigo for 2 days.    I need someone to connect me to the office.   I walk like I'm drunk.   I have nausea.   I want to talk with Apolonio Schneiders.   2. LIGHTHEADED: "Do you feel lightheaded?" (e.g., somewhat faint, woozy, weak upon standing)     *No Answer* 3. VERTIGO: "Do you feel like either you or the room is spinning or tilting?" (i.e. vertigo)     *No Answer* 4. SEVERITY: "How bad is it?"  "Do you feel like you are going to faint?" "Can you stand and walk?"   - MILD:  Feels slightly dizzy, but walking normally.   - MODERATE: Feels very unsteady when walking, but not falling; interferes with normal activities (e.g., school, work) .   - SEVERE: Unable to walk without falling, or requires assistance to walk without falling; feels like passing out now.      *No Answer* 5. ONSET:  "When did the dizziness begin?"     *No Answer* 6. AGGRAVATING FACTORS: "Does anything make it worse?" (e.g., standing, change in head position)     *No Answer* 7. HEART RATE: "Can you tell me your heart rate?" "How many beats in 15 seconds?"  (Note: not all patients can do this)       *No Answer* 8. CAUSE: "What do you think is causing the dizziness?"     *No Answer* 9. RECURRENT SYMPTOM: "Have you had dizziness before?" If Yes, ask: "When was the last time?" "What happened that time?"     *No Answer* 10. OTHER SYMPTOMS: "Do you have any other symptoms?" (e.g., fever, chest pain, vomiting, diarrhea, bleeding)       *No Answer* 11. PREGNANCY: "Is there any chance you are pregnant?" "When was your last menstrual period?"        *No Answer*  Protocols used: DIZZINESS Heidi Dach

## 2020-06-01 NOTE — Telephone Encounter (Signed)
Pt.is requesting  Medication for her vertigo x 2 days I do have her on the schedule for 2:00 today for a virtual appt  If medication can not be called in.

## 2020-06-01 NOTE — Telephone Encounter (Signed)
Copied from [place] [id number]. Topic: General - Other >> Jun 01, 2020  9:03 AM Tessa Lerner A wrote: Reason for CRM: Patient would like to be called back regarding dizziness and discomfort   Patient was offered a virtual appointment for 06/01/20 at 4:00 PM and declined appointment  Patient was degrading, demeaning and disrespectful to agent at the time of call  Patient has previous history of disrespecting agent over the phone  Patient has previously referred to agent as a "joke"   Patient has acknowledged inappropriate behavior "I don't mean to yell" and continues to exhibit said behavior   Patient would like to be contacted by a member of staff to discuss scheduling

## 2020-06-01 NOTE — Telephone Encounter (Signed)
The pt was notified of Dr. Raliegh Ip recommendations. She verbalize understanding, appt cancelled for this afternoon.

## 2020-06-06 DIAGNOSIS — L931 Subacute cutaneous lupus erythematosus: Secondary | ICD-10-CM | POA: Diagnosis not present

## 2020-06-07 DIAGNOSIS — J432 Centrilobular emphysema: Secondary | ICD-10-CM | POA: Diagnosis not present

## 2020-06-07 DIAGNOSIS — E7800 Pure hypercholesterolemia, unspecified: Secondary | ICD-10-CM | POA: Diagnosis not present

## 2020-06-07 DIAGNOSIS — Z933 Colostomy status: Secondary | ICD-10-CM | POA: Diagnosis not present

## 2020-06-07 DIAGNOSIS — M3219 Other organ or system involvement in systemic lupus erythematosus: Secondary | ICD-10-CM | POA: Diagnosis not present

## 2020-06-07 DIAGNOSIS — M064 Inflammatory polyarthropathy: Secondary | ICD-10-CM | POA: Diagnosis not present

## 2020-06-07 DIAGNOSIS — K9419 Other complications of enterostomy: Secondary | ICD-10-CM | POA: Diagnosis not present

## 2020-06-07 DIAGNOSIS — E890 Postprocedural hypothyroidism: Secondary | ICD-10-CM | POA: Diagnosis not present

## 2020-06-07 DIAGNOSIS — N1832 Chronic kidney disease, stage 3b: Secondary | ICD-10-CM | POA: Diagnosis not present

## 2020-06-07 DIAGNOSIS — Z85118 Personal history of other malignant neoplasm of bronchus and lung: Secondary | ICD-10-CM | POA: Diagnosis not present

## 2020-06-07 DIAGNOSIS — I129 Hypertensive chronic kidney disease with stage 1 through stage 4 chronic kidney disease, or unspecified chronic kidney disease: Secondary | ICD-10-CM | POA: Diagnosis not present

## 2020-06-07 DIAGNOSIS — D631 Anemia in chronic kidney disease: Secondary | ICD-10-CM | POA: Diagnosis not present

## 2020-06-07 DIAGNOSIS — I700 Atherosclerosis of aorta: Secondary | ICD-10-CM | POA: Diagnosis not present

## 2020-06-07 DIAGNOSIS — F419 Anxiety disorder, unspecified: Secondary | ICD-10-CM | POA: Diagnosis not present

## 2020-06-11 ENCOUNTER — Other Ambulatory Visit: Payer: Self-pay | Admitting: Family Medicine

## 2020-06-12 DIAGNOSIS — N1832 Chronic kidney disease, stage 3b: Secondary | ICD-10-CM | POA: Diagnosis not present

## 2020-06-12 DIAGNOSIS — E890 Postprocedural hypothyroidism: Secondary | ICD-10-CM | POA: Diagnosis not present

## 2020-06-12 DIAGNOSIS — R809 Proteinuria, unspecified: Secondary | ICD-10-CM | POA: Diagnosis not present

## 2020-06-12 DIAGNOSIS — D631 Anemia in chronic kidney disease: Secondary | ICD-10-CM | POA: Diagnosis not present

## 2020-06-12 DIAGNOSIS — E871 Hypo-osmolality and hyponatremia: Secondary | ICD-10-CM | POA: Diagnosis not present

## 2020-06-12 DIAGNOSIS — E7800 Pure hypercholesterolemia, unspecified: Secondary | ICD-10-CM | POA: Diagnosis not present

## 2020-06-12 DIAGNOSIS — I10 Essential (primary) hypertension: Secondary | ICD-10-CM | POA: Diagnosis not present

## 2020-06-20 DIAGNOSIS — F411 Generalized anxiety disorder: Secondary | ICD-10-CM | POA: Diagnosis not present

## 2020-06-20 DIAGNOSIS — F331 Major depressive disorder, recurrent, moderate: Secondary | ICD-10-CM | POA: Diagnosis not present

## 2020-06-21 DIAGNOSIS — F4322 Adjustment disorder with anxiety: Secondary | ICD-10-CM | POA: Diagnosis not present

## 2020-06-21 DIAGNOSIS — F322 Major depressive disorder, single episode, severe without psychotic features: Secondary | ICD-10-CM | POA: Diagnosis not present

## 2020-06-21 DIAGNOSIS — F5105 Insomnia due to other mental disorder: Secondary | ICD-10-CM | POA: Diagnosis not present

## 2020-06-28 DIAGNOSIS — D631 Anemia in chronic kidney disease: Secondary | ICD-10-CM | POA: Diagnosis not present

## 2020-06-28 DIAGNOSIS — R809 Proteinuria, unspecified: Secondary | ICD-10-CM | POA: Diagnosis not present

## 2020-06-28 DIAGNOSIS — N1832 Chronic kidney disease, stage 3b: Secondary | ICD-10-CM | POA: Diagnosis not present

## 2020-06-28 DIAGNOSIS — E871 Hypo-osmolality and hyponatremia: Secondary | ICD-10-CM | POA: Diagnosis not present

## 2020-07-04 ENCOUNTER — Other Ambulatory Visit: Payer: Self-pay

## 2020-07-04 ENCOUNTER — Emergency Department
Admission: EM | Admit: 2020-07-04 | Discharge: 2020-07-04 | Disposition: A | Discharge status: Home / Self Care | Payer: Medicare Other | Attending: Emergency Medicine | Admitting: Emergency Medicine

## 2020-07-04 ENCOUNTER — Encounter: Payer: Self-pay | Admitting: Emergency Medicine

## 2020-07-04 DIAGNOSIS — Z8541 Personal history of malignant neoplasm of cervix uteri: Secondary | ICD-10-CM | POA: Insufficient documentation

## 2020-07-04 DIAGNOSIS — E1122 Type 2 diabetes mellitus with diabetic chronic kidney disease: Secondary | ICD-10-CM | POA: Insufficient documentation

## 2020-07-04 DIAGNOSIS — W269XXA Contact with unspecified sharp object(s), initial encounter: Secondary | ICD-10-CM | POA: Insufficient documentation

## 2020-07-04 DIAGNOSIS — Z85110 Personal history of malignant carcinoid tumor of bronchus and lung: Secondary | ICD-10-CM | POA: Diagnosis not present

## 2020-07-04 DIAGNOSIS — I129 Hypertensive chronic kidney disease with stage 1 through stage 4 chronic kidney disease, or unspecified chronic kidney disease: Secondary | ICD-10-CM | POA: Diagnosis not present

## 2020-07-04 DIAGNOSIS — S6992XA Unspecified injury of left wrist, hand and finger(s), initial encounter: Secondary | ICD-10-CM | POA: Diagnosis present

## 2020-07-04 DIAGNOSIS — Z87891 Personal history of nicotine dependence: Secondary | ICD-10-CM | POA: Insufficient documentation

## 2020-07-04 DIAGNOSIS — Z79899 Other long term (current) drug therapy: Secondary | ICD-10-CM | POA: Diagnosis not present

## 2020-07-04 DIAGNOSIS — E890 Postprocedural hypothyroidism: Secondary | ICD-10-CM | POA: Diagnosis not present

## 2020-07-04 DIAGNOSIS — S61215A Laceration without foreign body of left ring finger without damage to nail, initial encounter: Secondary | ICD-10-CM | POA: Insufficient documentation

## 2020-07-04 DIAGNOSIS — Z91040 Latex allergy status: Secondary | ICD-10-CM | POA: Diagnosis not present

## 2020-07-04 DIAGNOSIS — J449 Chronic obstructive pulmonary disease, unspecified: Secondary | ICD-10-CM | POA: Diagnosis not present

## 2020-07-04 DIAGNOSIS — N183 Chronic kidney disease, stage 3 unspecified: Secondary | ICD-10-CM | POA: Diagnosis not present

## 2020-07-04 DIAGNOSIS — Y9389 Activity, other specified: Secondary | ICD-10-CM | POA: Diagnosis not present

## 2020-07-04 NOTE — ED Provider Notes (Signed)
[facility] Emergency Department Provider Note   ____________________________________________    I have reviewed the triage vital signs and the nursing notes.   HISTORY  Chief Complaint Laceration     HPI Margaret Black is a 80 y.o. female who presents after accidental laceration to the left tip of her fourth digit while cutting vegetables.  No other injuries reported.  Tetanus up-to-date  Past Medical History:  Diagnosis Date  . Anxiety    Related to HTN and vision loss  . Central retinal vein occlusion of left eye    legally blind in Left eye  . 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 Collagen vascular disease ([place])    uncharacterized.  has seen rheum.  positive RF, positive serologies for Sjogrens.  C-ANCA/P-ANCA neg, ANA  neg, anti-dsDNA neg, anti-SCL70 neg, anti-centromere Ab neg  . COPD (chronic obstructive pulmonary disease) ([place])    quit smoking 1993  . Diabetes mellitus without complication ([place])   . Diverticulosis   . Dysfunction of eustachian tube   . Fatigue   . GERD (gastroesophageal reflux disease)   . Goiter, unspecified   . HLD (hyperlipidemia)   . HTN (hypertension)    difficult to control, hypertensive urgency hospitalizations, normal urine/plasma metanephrines and catecholamines, renal dopplers without stenosis  . Hyperaldosteronism    serum aldo/renin ration elevated (aldo 23.7, renin <0.15), CT abd 2010 with normal adrenals, dedicated adrenal CT without adrenal adenoma,   . Hypothyroid   . Insomnia    Related to HTN and vision loss  . Irritable bowel syndrome   . Leiomyosarcoma of uterus ([place]) 09/22/2014  . Lung cancer [facility])    s/p R lobectomy 2001  . Lupus ([place])   . Osteopenia   . Pulmonary hypertension ([place])    echo [facility] 12/2008 with EF >5%, grade II diastolic dysfunction, RSVP >72mmHg, ACE level normal.  f/u echo at [place] - EF 55-60%, mild MR, mild LAE, PASP 25 + RA (no  pulm HTN)  . Thyroid cancer ([place]) 2014  . Urge incontinence   . Uterine cancer [facility])    s/p hysterectomy 2001    Patient Active Problem List   Diagnosis Date Noted  . SBO (small bowel obstruction) ([place])   . Protein-calorie malnutrition, severe 03/03/2020  . Hypothyroidism   . Diverticulitis of large intestine with abscess without bleeding   . Acute kidney injury superimposed on CKD ([place])   . Essential hypertension   . Diverticulitis of colon with bleeding 01/07/2020  . Lung cancer ([place])   . Hyponatremia   . Aortic atherosclerosis ([place]) 10/24/2019  . Centrilobular emphysema ([place]) 10/24/2019  . Abdominal pain 06/21/2019  . Anemia in chronic kidney disease 01/19/2019  . Anxiety 06/12/2017  . Rheumatoid factor positive 02/26/2017  . Brachial radiculitis 12/25/2016  . Closed traumatic dislocation of cervical vertebra 12/25/2016  . Weakness of limb 12/25/2016  . Insomnia 02/08/2015  . Carotid stenosis 02/08/2015  . Solitary pulmonary nodule 03/31/2014  . Systemic lupus ([place]) 12/15/2013  . Near syncope 05/03/2013  . Vertigo 08/09/2012  . Hx of cancer of lung 07/14/2012  . History of thyroid cancer 06/30/2012  . Livedo reticularis 06/11/2012  . Antiphospholipid antibody positive 06/11/2012  . CRVO (central retinal vein occlusion) 05/25/2012  . Rash 04/06/2012  . Syncope 09/29/2011  . Stage III chronic kidney disease ([place]) 10/09/2010  . HYPERALDOSTERONISM UNSPECIFIED 01/12/2009  . HYPERPLASIA, ADRENAL 01/12/2009  . HYPERTENSION, PULMONARY 01/04/2009  .  Blindness of left eye 12/12/2008  . IBS 07/12/2007  . Hypercholesterolemia 04/27/2007  . INCONTINENCE, URGE 04/23/2007  . Postsurgical hypothyroidism 06/22/2006  . Benign hypertension with CKD (chronic kidney disease) stage III ([place]) 06/22/2006  . GERD 06/22/2006  . Osteopenia 06/22/2006  . UTERINE CANCER, HX OF 06/22/2006  . DIVERTICULITIS, HX OF 06/22/2006    Past Surgical History:  Procedure Laterality Date  .  ABDOMINAL HYSTERECTOMY  06/1999  . APPENDECTOMY  1958  . CHOLECYSTECTOMY  1982  . COLONOSCOPY WITH PROPOFOL N/A 03/01/2020   Procedure: COLONOSCOPY WITH PROPOFOL;  Surgeon: Lin Landsman, MD;  Location: [facility] ENDOSCOPY;  Service: Gastroenterology;  Laterality: N/A;  . COLOSTOMY N/A 03/07/2020   Procedure: COLOSTOMY;  Surgeon: Herbert Pun, MD;  Location: [facility] ORS;  Service: General;  Laterality: N/A;  . CT ABD W & PELVIS WO CM  2010   neg except diverticulosis  . CT chest  2007   enlarged thyroid with goiter  . ESOPHAGOGASTRODUODENOSCOPY (EGD) WITH PROPOFOL N/A 03/01/2020   Procedure: ESOPHAGOGASTRODUODENOSCOPY (EGD) WITH PROPOFOL;  Surgeon: Lin Landsman, MD;  Location: [place];  Service: Gastroenterology;  Laterality: N/A;  . LOBECTOMY Right 04/1999   right lung, done in [place]  . lung nodule excision Left 2003   benign  . PARTIAL COLECTOMY N/A 03/07/2020   Procedure: PARTIAL COLECTOMY;  Surgeon: Herbert Pun, MD;  Location: [facility] ORS;  Service: General;  Laterality: N/A;  . THYROIDECTOMY  2014  . TUBAL LIGATION  1975    Prior to Admission medications   Medication Sig Start Date End Date Taking? Authorizing Provider  acidophilus (RISAQUAD) CAPS capsule 2 capsules daily (okay to substitute any probiotic) Patient not taking: Reported on 04/13/2020 01/09/20   Loletha Grayer, MD  BYSTOLIC 5 MG tablet Take 1 tablet (5 mg total) by mouth daily. 08/23/19   Karamalegos, Devonne Doughty, DO  CALCIUM-VITAMIN D PO Take 1 tablet by mouth daily.  Patient not taking: No sig reported    [provider]  cyclobenzaprine (FLEXERIL) 5 MG tablet Take 1 tablet (5 mg total) by mouth 3 (three) times daily as needed for muscle spasms. Patient not taking: Reported on 04/13/2020 03/23/20   Olean Ree, MD  enalapril (VASOTEC) 5 MG tablet Take 5 mg by mouth at bedtime.    [provider]  Ensure Max Protein (ENSURE MAX PROTEIN) LIQD Take 330 mLs (11 oz total) by  mouth daily. 03/21/20   Lorella Nimrod, MD  ferrous sulfate 325 (65 FE) MG tablet Take 1 tablet (325 mg total) by mouth 2 (two) times daily with a meal. Patient not taking: Reported on 04/13/2020 03/20/20   Lorella Nimrod, MD  lansoprazole (PREVACID) 15 MG capsule Take 1 capsule (15 mg total) by mouth daily before breakfast. 01/27/20   Karamalegos, Devonne Doughty, DO  LORazepam (ATIVAN) 0.5 MG tablet Take 1 tablet (0.5 mg total) by mouth at bedtime. 04/13/20   Karamalegos, Devonne Doughty, DO  LORazepam (ATIVAN) 0.5 MG tablet Take 1 tablet (0.5 mg total) by mouth daily as needed for anxiety. 04/26/20   Kathrine Haddock, NP  meclizine (ANTIVERT) 25 MG tablet Take 1 tablet (25 mg total) by mouth 3 (three) times daily as needed for dizziness. 06/01/20   Karamalegos, Devonne Doughty, DO  Multiple Vitamin (MULTIVITAMIN WITH MINERALS) TABS tablet Take 1 tablet by mouth daily. Patient not taking: Reported on 04/13/2020 03/21/20   Lorella Nimrod, MD  Multiple Vitamin (MULTIVITAMIN) tablet Take 1 tablet by mouth daily.  Patient not taking: No sig reported  [provider]  NIFEdipine (PROCARDIA XL/NIFEDICAL XL) 60 MG 24 hr tablet Take 1 tablet (60 mg total) by mouth every evening. 03/28/20   Karamalegos, Devonne Doughty, DO  ondansetron (ZOFRAN ODT) 4 MG disintegrating tablet Take 1 tablet (4 mg total) by mouth every 8 (eight) hours as needed for nausea or vomiting. 01/30/20   Karamalegos, Devonne Doughty, DO  polyethylene glycol (MIRALAX / GLYCOLAX) 17 g packet Take 17 g by mouth daily as needed for mild constipation or moderate constipation. Patient not taking: Reported on 04/13/2020 03/20/20   Lorella Nimrod, MD  sertraline (ZOLOFT) 25 MG tablet TAKE 1 TABLET (25 MG TOTAL) BY MOUTH DAILY. 06/11/20   Karamalegos, Devonne Doughty, DO  spironolactone (ALDACTONE) 25 MG tablet Take 0.5 tablets (12.5 mg total) by mouth daily. 04/05/20   Parks Ranger, Devonne Doughty, DO  SYNTHROID 88 MCG tablet Take 1 tablet (88 mcg total) by mouth daily before  breakfast. 09/15/19   Parks Ranger, Devonne Doughty, DO  vitamin C (ASCORBIC ACID) 500 MG tablet Take 500 mg by mouth daily.  Patient not taking: No sig reported    [provider]     Allergies Lactase, Phenergan [promethazine], Atenolol, and Latex  Family History  Problem Relation Age of Onset  . Hypertension Mother   . Pneumonia Father   . Alcohol abuse Father     Social History Social History   Tobacco Use  . Smoking status: Former Smoker    Types: Cigarettes    Quit date: 07/04/1991    Years since quitting: 29.0  . Smokeless tobacco: Never Used  . Tobacco comment: Quit in early 1980's.  Vaping Use  . Vaping Use: Never used  Substance Use Topics  . Alcohol use: No  . Drug use: No    Review of Systems         Musculoskeletal: Finger pain Skin: Laceration Neurological: No numbness    ____________________________________________   PHYSICAL EXAM:  VITAL SIGNS: ED Triage Vitals [07/04/20 1721]  Enc Vitals Group     BP (!) 160/54     Pulse Rate 80     Resp 16     Temp 98.3 F (36.8 C)     Temp Source Oral     SpO2 100 %     Weight 53.1 kg (117 lb)     Height 1.676 m (5\' 6" )     Head Circumference      Peak Flow      Pain Score 2     Pain Loc      Pain Edu?      Excl. in [place]?      Constitutional: Alert and oriented. No acute distress. Pleasant and interactive Eyes: Conjunctivae are normal.  Head: Atraumatic. Marland Kitchen  Respiratory: Normal respiratory effort.  No retractions.  Musculoskeletal: Left fourth finger, laceration to the very distal tip, nail intact, bleeding controlled Neurologic:  Normal speech and language. No gross focal neurologic deficits are appreciated.   Skin:  Skin is warm, dry, see above   ____________________________________________   LABS (all labs ordered are listed, but only abnormal results are displayed)  Labs Reviewed - No data to  display ____________________________________________  EKG   ____________________________________________  RADIOLOGY   ____________________________________________   PROCEDURES  Procedure(s) performed: yes  .Marland KitchenLaceration Repair  Date/Time: 07/04/2020 8:42 PM Performed by: Lavonia Drafts, MD Authorized by: Lavonia Drafts, MD   Consent:    Consent obtained:  Verbal   Consent given by:  Patient   Risks discussed:  Infection,  pain and poor cosmetic result Laceration details:    Location:  Finger   Finger location:  L ring finger   Length (cm):  1 Exploration:    Hemostasis achieved with:  Direct pressure   Contaminated: no   Treatment:    Visualized foreign bodies/material removed: no   Skin repair:    Repair method:  Tissue adhesive Approximation:    Approximation:  Close Repair type:    Repair type:  Simple Post-procedure details:    Dressing:  Sterile dressing   Procedure completion:  Tolerated well, no immediate complications     Critical Care performed: No ____________________________________________   INITIAL IMPRESSION / ASSESSMENT AND PLAN / ED COURSE  Pertinent labs & imaging results that were available during my care of the patient were reviewed by me and considered in my medical decision making (see chart for details).  Laceration repaired with tissue adhesive, patient tolerated well, tetanus up-to-date.  Daily dressing changes, splint applied by nurse   ____________________________________________   FINAL CLINICAL IMPRESSION(S) / ED DIAGNOSES  Final diagnoses:  Laceration of left ring finger without foreign body without damage to nail, initial encounter      NEW MEDICATIONS STARTED DURING THIS VISIT:  Discharge Medication List as of 07/04/2020  5:46 PM       Note:  This document was prepared using Dragon voice recognition software and may include unintentional dictation errors.   Lavonia Drafts, MD 07/04/20 2043

## 2020-07-04 NOTE — ED Triage Notes (Signed)
Pt comes into the ED via POV c/o laceration to the left 4th digit.  Bleeding under control at this time.

## 2020-07-04 NOTE — ED Notes (Signed)
Pt verbalized understanding of d/c instructions at this time and denied further questions at this time. Pt ambulatory to ED lobby at this time, NAD noted, RR even and unlabored, steady gait noted.

## 2020-07-04 NOTE — ED Notes (Signed)
Lacerated L ring finger wrapped and splinted per MD Kinner order at this time. Pt tolerated well.

## 2020-07-09 DIAGNOSIS — E7800 Pure hypercholesterolemia, unspecified: Secondary | ICD-10-CM | POA: Diagnosis not present

## 2020-07-09 DIAGNOSIS — N1831 Chronic kidney disease, stage 3a: Secondary | ICD-10-CM | POA: Diagnosis not present

## 2020-07-09 DIAGNOSIS — K9419 Other complications of enterostomy: Secondary | ICD-10-CM | POA: Diagnosis not present

## 2020-07-09 DIAGNOSIS — E890 Postprocedural hypothyroidism: Secondary | ICD-10-CM | POA: Diagnosis not present

## 2020-07-09 DIAGNOSIS — Z85118 Personal history of other malignant neoplasm of bronchus and lung: Secondary | ICD-10-CM | POA: Diagnosis not present

## 2020-07-09 DIAGNOSIS — Z85850 Personal history of malignant neoplasm of thyroid: Secondary | ICD-10-CM | POA: Diagnosis not present

## 2020-07-09 DIAGNOSIS — I700 Atherosclerosis of aorta: Secondary | ICD-10-CM | POA: Diagnosis not present

## 2020-07-09 DIAGNOSIS — I129 Hypertensive chronic kidney disease with stage 1 through stage 4 chronic kidney disease, or unspecified chronic kidney disease: Secondary | ICD-10-CM | POA: Diagnosis not present

## 2020-07-09 DIAGNOSIS — D631 Anemia in chronic kidney disease: Secondary | ICD-10-CM | POA: Diagnosis not present

## 2020-07-09 DIAGNOSIS — M3219 Other organ or system involvement in systemic lupus erythematosus: Secondary | ICD-10-CM | POA: Diagnosis not present

## 2020-07-09 DIAGNOSIS — J432 Centrilobular emphysema: Secondary | ICD-10-CM | POA: Diagnosis not present

## 2020-07-26 DIAGNOSIS — F4322 Adjustment disorder with anxiety: Secondary | ICD-10-CM | POA: Diagnosis not present

## 2020-07-26 DIAGNOSIS — F5105 Insomnia due to other mental disorder: Secondary | ICD-10-CM | POA: Diagnosis not present

## 2020-07-26 DIAGNOSIS — F322 Major depressive disorder, single episode, severe without psychotic features: Secondary | ICD-10-CM | POA: Diagnosis not present

## 2020-07-27 ENCOUNTER — Other Ambulatory Visit: Payer: Self-pay | Admitting: Family Medicine

## 2020-07-27 DIAGNOSIS — I129 Hypertensive chronic kidney disease with stage 1 through stage 4 chronic kidney disease, or unspecified chronic kidney disease: Secondary | ICD-10-CM

## 2020-07-27 DIAGNOSIS — N183 Chronic kidney disease, stage 3 unspecified: Secondary | ICD-10-CM

## 2020-08-08 DIAGNOSIS — Z23 Encounter for immunization: Secondary | ICD-10-CM | POA: Diagnosis not present

## 2020-08-14 DIAGNOSIS — F322 Major depressive disorder, single episode, severe without psychotic features: Secondary | ICD-10-CM | POA: Diagnosis not present

## 2020-08-14 DIAGNOSIS — F5105 Insomnia due to other mental disorder: Secondary | ICD-10-CM | POA: Diagnosis not present

## 2020-08-14 DIAGNOSIS — F4322 Adjustment disorder with anxiety: Secondary | ICD-10-CM | POA: Diagnosis not present

## 2020-08-23 ENCOUNTER — Ambulatory Visit: Payer: Self-pay | Admitting: General Surgery

## 2020-08-23 DIAGNOSIS — Z933 Colostomy status: Secondary | ICD-10-CM | POA: Diagnosis not present

## 2020-10-03 DIAGNOSIS — L931 Subacute cutaneous lupus erythematosus: Secondary | ICD-10-CM | POA: Diagnosis not present

## 2020-10-04 DIAGNOSIS — N1832 Chronic kidney disease, stage 3b: Secondary | ICD-10-CM | POA: Diagnosis not present

## 2020-10-04 DIAGNOSIS — I10 Essential (primary) hypertension: Secondary | ICD-10-CM | POA: Diagnosis not present

## 2020-10-04 DIAGNOSIS — D631 Anemia in chronic kidney disease: Secondary | ICD-10-CM | POA: Diagnosis not present

## 2020-10-04 DIAGNOSIS — E871 Hypo-osmolality and hyponatremia: Secondary | ICD-10-CM | POA: Diagnosis not present

## 2020-10-04 DIAGNOSIS — R809 Proteinuria, unspecified: Secondary | ICD-10-CM | POA: Diagnosis not present

## 2020-10-04 DIAGNOSIS — E038 Other specified hypothyroidism: Secondary | ICD-10-CM | POA: Diagnosis not present

## 2020-10-04 DIAGNOSIS — L931 Subacute cutaneous lupus erythematosus: Secondary | ICD-10-CM | POA: Diagnosis not present

## 2020-10-04 DIAGNOSIS — N179 Acute kidney failure, unspecified: Secondary | ICD-10-CM | POA: Diagnosis not present

## 2020-10-15 DIAGNOSIS — F4322 Adjustment disorder with anxiety: Secondary | ICD-10-CM | POA: Diagnosis not present

## 2020-10-15 DIAGNOSIS — F5105 Insomnia due to other mental disorder: Secondary | ICD-10-CM | POA: Diagnosis not present

## 2020-10-15 DIAGNOSIS — F322 Major depressive disorder, single episode, severe without psychotic features: Secondary | ICD-10-CM | POA: Diagnosis not present

## 2020-10-29 DIAGNOSIS — D631 Anemia in chronic kidney disease: Secondary | ICD-10-CM | POA: Diagnosis not present

## 2020-10-29 DIAGNOSIS — R809 Proteinuria, unspecified: Secondary | ICD-10-CM | POA: Diagnosis not present

## 2020-10-29 DIAGNOSIS — N1832 Chronic kidney disease, stage 3b: Secondary | ICD-10-CM | POA: Diagnosis not present

## 2020-10-29 DIAGNOSIS — E871 Hypo-osmolality and hyponatremia: Secondary | ICD-10-CM | POA: Diagnosis not present

## 2021-10-08 IMAGING — CT CT ABD-PELV W/ CM
2 of 5 series · 16 of 46 positions shown, 18 images · IV contrast (APPLIED)
Comparison: 03/06/2020

CLINICAL DATA: Status post partial colectomy on [DATE] with
colostomy, severe right abdominal pain

EXAM:
CT ABDOMEN AND PELVIS WITH CONTRAST
TECHNIQUE: Multidetector CT imaging of the abdomen and pelvis was performed
using the standard protocol following bolus administration of
intravenous contrast.
CONTRAST:  60mL OMNIPAQUE IOHEXOL 300 MG/ML  SOLN

[Series 2: routine abd/pel with · axial · 0.74mm/px · z∈[-753,-348]mm · 13 of 95 slices shown, 15 images]
[im 7/95  soft-tissue]
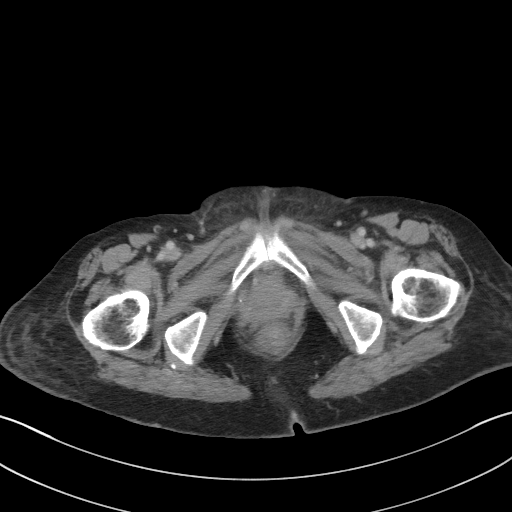
[im 7/95  bone]
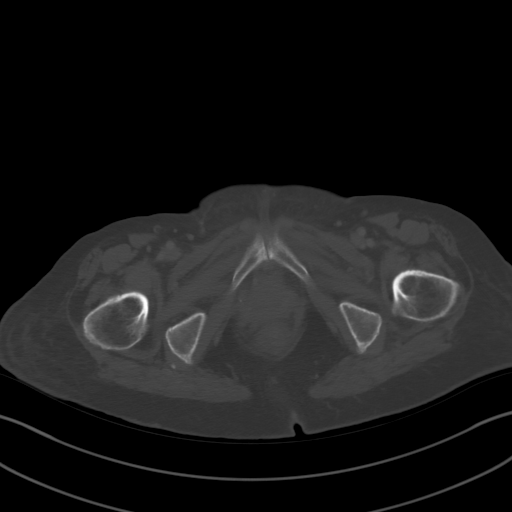
[im 13/95  soft-tissue]
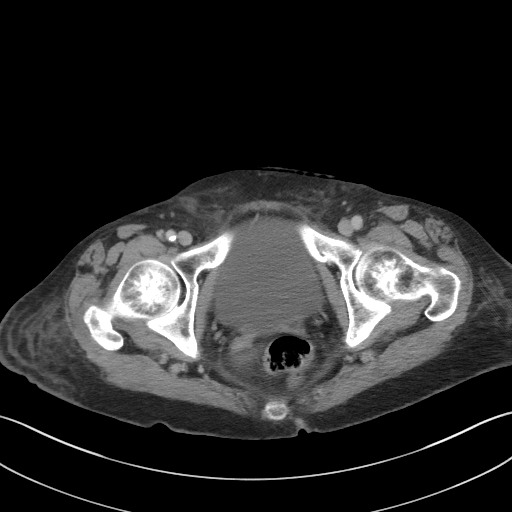
[im 19/95  soft-tissue]
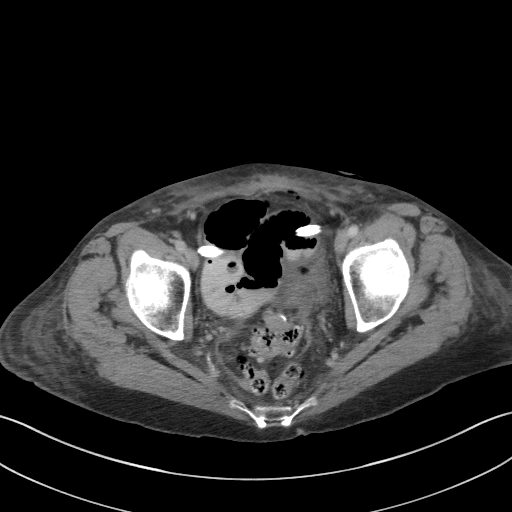
[im 26/95  soft-tissue]
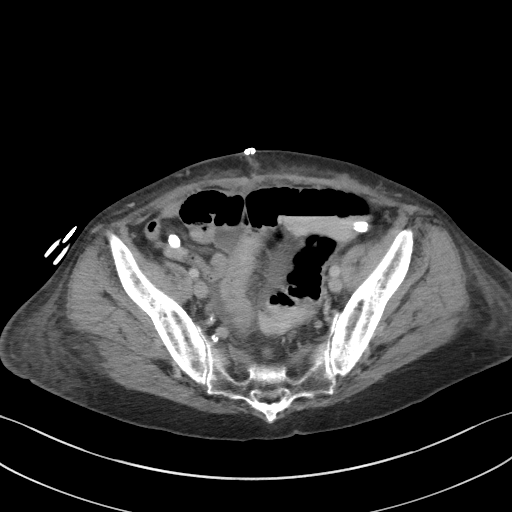
[im 32/95  soft-tissue]
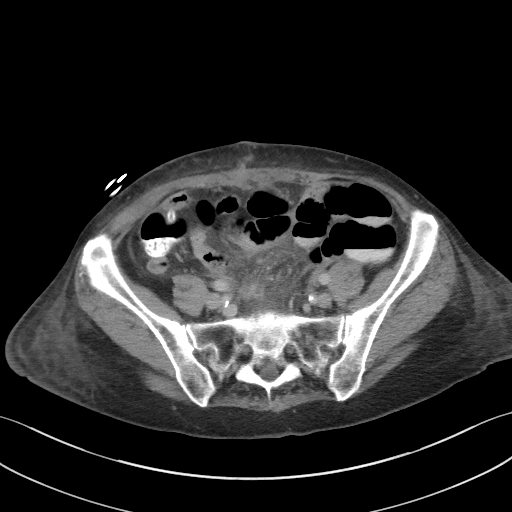
[im 38/95  soft-tissue]
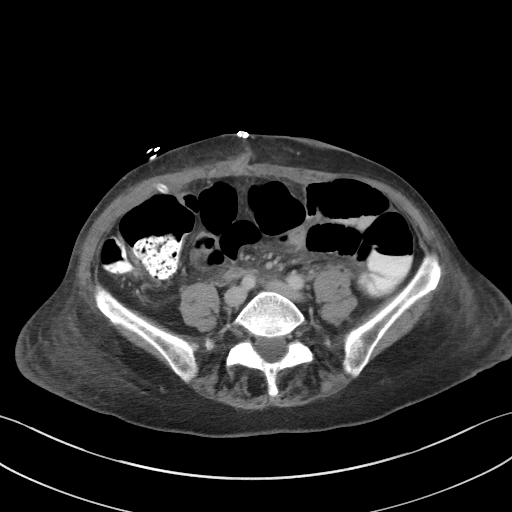
[im 51/95  soft-tissue]
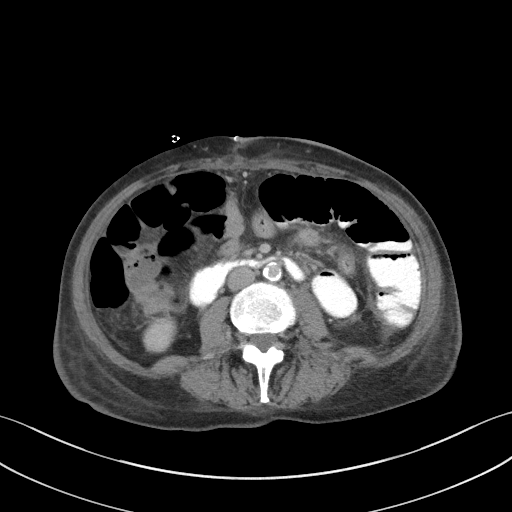
[im 57/95  soft-tissue]
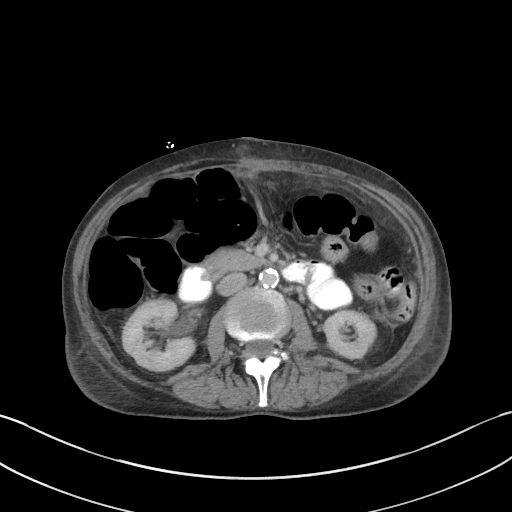
[im 63/95  soft-tissue]
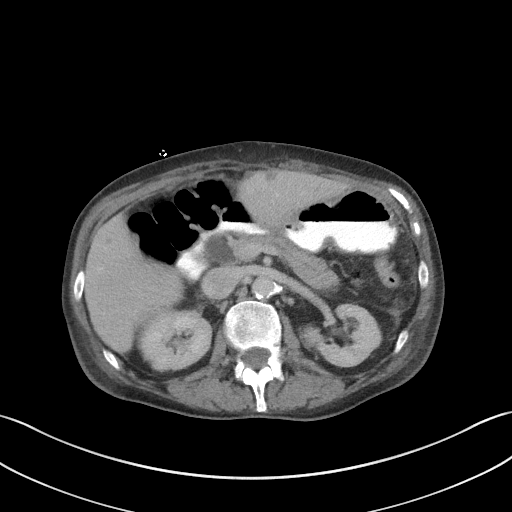
[im 63/95  bone]
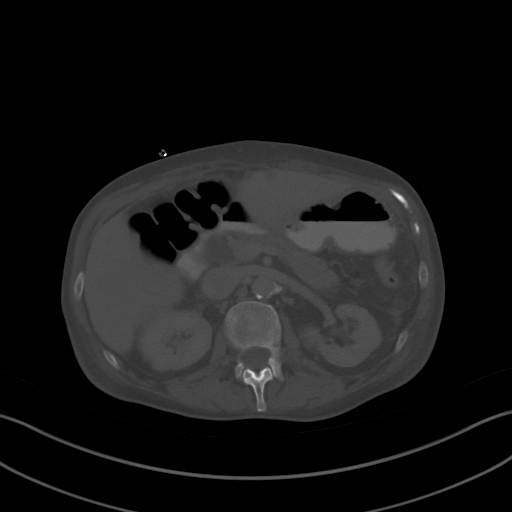
[im 69/95  soft-tissue]
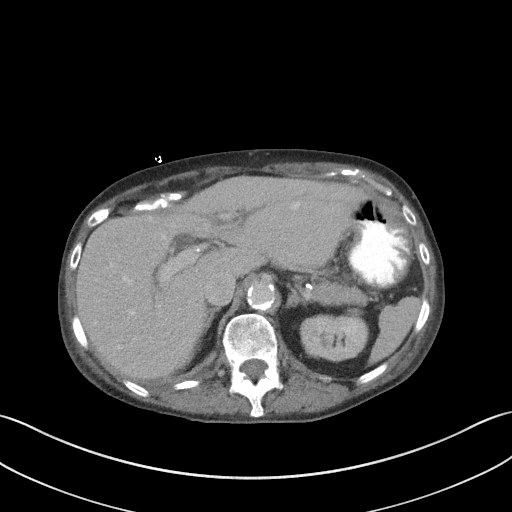
[im 76/95  soft-tissue]
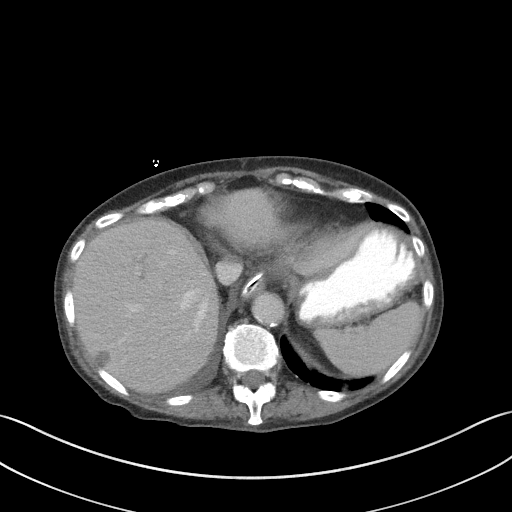
[im 82/95  soft-tissue]
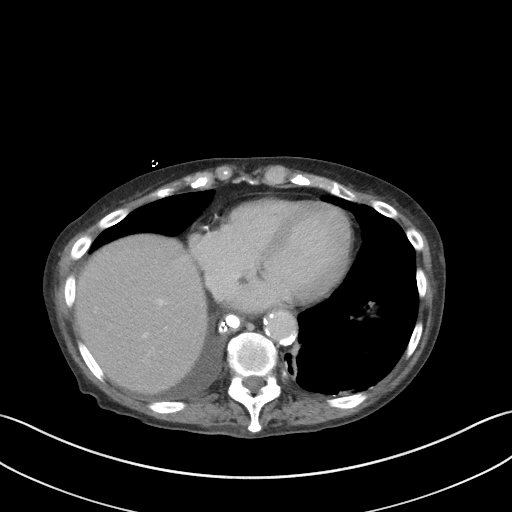
[im 88/95  soft-tissue]
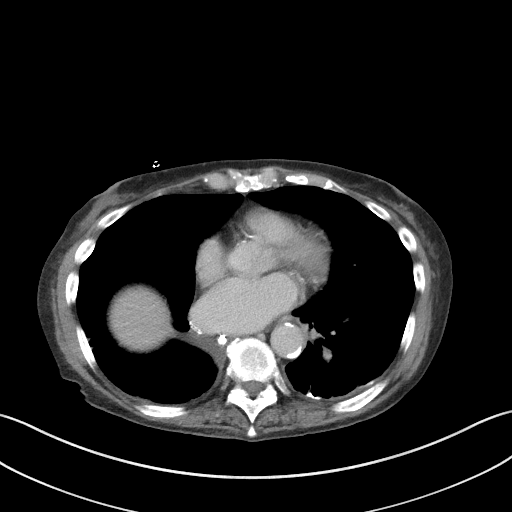

[Series 5: coronal st · coronal · 0.72mm/px · 3 of 78 slices shown]
[im 26/78  soft-tissue]
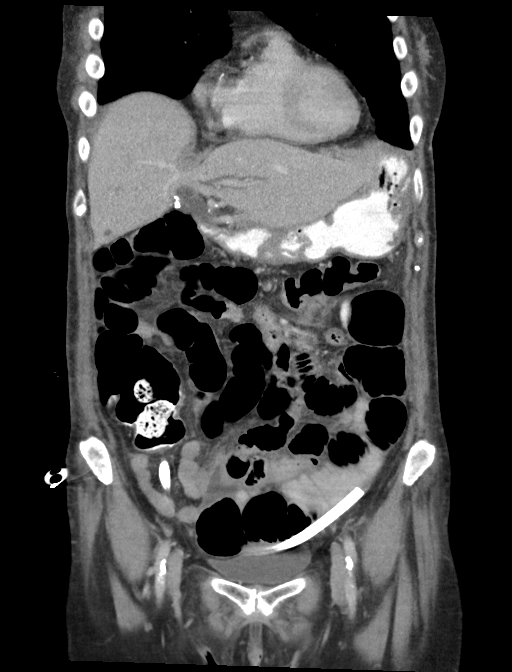
[im 35/78  soft-tissue]
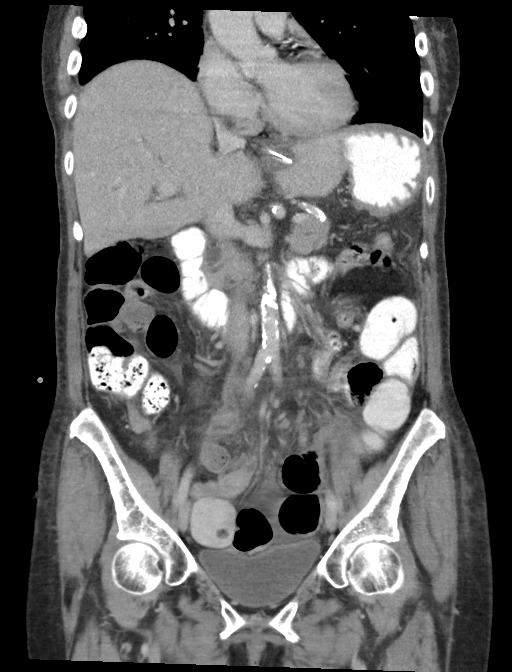
[im 43/78  soft-tissue]
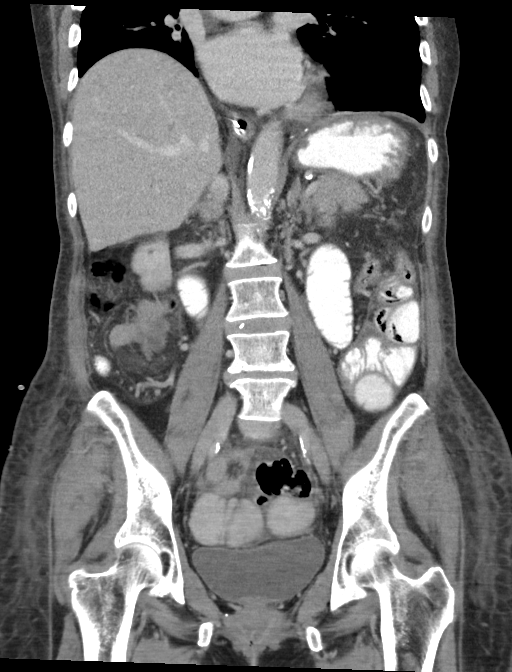

[16 of 46 positions shown; findings below may reference images not displayed]

FINDINGS: Lower chest: Trace right pleural effusion.

Hepatobiliary: 12 mm cyst in the posterior right hepatic lobe
(series 2/image 21). Additional subcentimeter probable cysts.

Status post cholecystectomy. Mild intrahepatic and extrahepatic
ductal dilatation. Dilated common duct, measuring 14 mm, although
smoothly tapering at the ampulla.

Pancreas: Within normal limits.

Spleen: Within normal limits.

Adrenals/Urinary Tract: Adrenal glands are within normal limits.

Kidneys are within normal limits.  No hydronephrosis.

Bladder is within normal limits.

Stomach/Bowel: Enteric tube terminates at the GE junction. Stomach
is within normal limits.

Status post left hemicolectomy with left mid abdominal colostomy.
Hartmann's pouch with distal sigmoid diverticulosis, without
evidence of diverticulitis.

No evidence of bowel obstruction.

Appendix is not discretely visualized.

Vascular/Lymphatic: No evidence of abdominal aortic aneurysm.

Atherosclerotic calcifications of the abdominal aorta and branch
vessels.

No suspicious abdominopelvic lymphadenopathy.

Reproductive: Status post hysterectomy.

No adnexal masses.

Other: Trace mesenteric fluid/stranding.

Right mid abdominal surgical drain terminates in the left lower
pelvis.

Prior multiloculated fluid collection/abscess in the left lower
pelvis has resolved.

No free air.

Musculoskeletal: Midline skin staples with postsurgical changes
along the anterior abdominal wall. Visualized osseous structures are
within normal limits.
IMPRESSION: Status post left hemicolectomy with left mid abdominal colostomy.

Prior multiloculated fluid collection/abscess in the left lower
pelvis has resolved. Surgical drain terminates in the left lower
pelvis. No free air.

Enteric tube terminates at the GE junction.

## 2023-10-09 ENCOUNTER — Encounter: Payer: Self-pay | Admitting: Advanced Practice Midwife
